# Patient Record
Sex: Male | Born: 1960
Health system: Southern US, Community
[De-identification: ages and names within clinical notes are randomized; demographics above are authoritative.]

## PROBLEM LIST (undated history)

## (undated) DIAGNOSIS — J189 Pneumonia, unspecified organism: Secondary | ICD-10-CM

## (undated) DIAGNOSIS — R7401 Elevation of levels of liver transaminase levels: Secondary | ICD-10-CM

## (undated) DIAGNOSIS — K760 Fatty (change of) liver, not elsewhere classified: Secondary | ICD-10-CM

## (undated) DIAGNOSIS — E119 Type 2 diabetes mellitus without complications: Secondary | ICD-10-CM

## (undated) DIAGNOSIS — H9319 Tinnitus, unspecified ear: Secondary | ICD-10-CM

## (undated) DIAGNOSIS — E785 Hyperlipidemia, unspecified: Secondary | ICD-10-CM

## (undated) DIAGNOSIS — G4700 Insomnia, unspecified: Secondary | ICD-10-CM

## (undated) DIAGNOSIS — R7303 Prediabetes: Secondary | ICD-10-CM

## (undated) DIAGNOSIS — M1990 Unspecified osteoarthritis, unspecified site: Secondary | ICD-10-CM

## (undated) DIAGNOSIS — E669 Obesity, unspecified: Secondary | ICD-10-CM

## (undated) DIAGNOSIS — R188 Other ascites: Secondary | ICD-10-CM

## (undated) DIAGNOSIS — M25562 Pain in left knee: Secondary | ICD-10-CM

## (undated) DIAGNOSIS — F419 Anxiety disorder, unspecified: Secondary | ICD-10-CM

## (undated) DIAGNOSIS — E042 Nontoxic multinodular goiter: Secondary | ICD-10-CM

## (undated) DIAGNOSIS — L409 Psoriasis, unspecified: Secondary | ICD-10-CM

## (undated) DIAGNOSIS — S39013A Strain of muscle, fascia and tendon of pelvis, initial encounter: Secondary | ICD-10-CM

## (undated) DIAGNOSIS — R5383 Other fatigue: Secondary | ICD-10-CM

## (undated) DIAGNOSIS — E039 Hypothyroidism, unspecified: Secondary | ICD-10-CM

## (undated) DIAGNOSIS — T7840XA Allergy, unspecified, initial encounter: Secondary | ICD-10-CM

## (undated) DIAGNOSIS — I10 Essential (primary) hypertension: Secondary | ICD-10-CM

## (undated) HISTORY — DX: Other fatigue: R53.83

## (undated) HISTORY — DX: Hypothyroidism, unspecified: E03.9

## (undated) HISTORY — DX: Allergy, unspecified, initial encounter: T78.40XA

## (undated) HISTORY — DX: Obesity, unspecified: E66.9

## (undated) HISTORY — DX: Hyperlipidemia, unspecified: E78.5

## (undated) HISTORY — PX: NOSE SURGERY: SHX723

## (undated) HISTORY — PX: ROTATOR CUFF REPAIR: SHX139

## (undated) HISTORY — PX: APPENDECTOMY: SHX54

## (undated) HISTORY — DX: Nontoxic multinodular goiter: E04.2

## (undated) HISTORY — DX: Other ascites: R18.8

## (undated) HISTORY — DX: Unspecified osteoarthritis, unspecified site: M19.90

## (undated) HISTORY — DX: Anxiety disorder, unspecified: F41.9

## (undated) HISTORY — DX: Pain in left knee: M25.562

## (undated) HISTORY — DX: Pneumonia, unspecified organism: J18.9

## (undated) HISTORY — DX: Insomnia, unspecified: G47.00

## (undated) HISTORY — DX: Tinnitus, unspecified ear: H93.19

## (undated) HISTORY — DX: Elevation of levels of liver transaminase levels: R74.01

## (undated) HISTORY — DX: Essential (primary) hypertension: I10

## (undated) HISTORY — DX: Strain of muscle, fascia and tendon of pelvis, initial encounter: S39.013A

## (undated) HISTORY — DX: Type 2 diabetes mellitus without complications: E11.9

---
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
## 2003-07-20 ENCOUNTER — Ambulatory Visit (HOSPITAL_COMMUNITY): Admission: RE | Admit: 2003-07-20 | Discharge: 2003-07-20 | Payer: Self-pay | Admitting: Orthopedic Surgery

## 2003-10-02 ENCOUNTER — Inpatient Hospital Stay (HOSPITAL_COMMUNITY): Admission: AD | Admit: 2003-10-02 | Discharge: 2003-10-05 | Payer: Self-pay | Admitting: Internal Medicine

## 2008-10-08 ENCOUNTER — Encounter: Admission: RE | Admit: 2008-10-08 | Discharge: 2008-10-08 | Payer: Self-pay | Admitting: Internal Medicine

## 2009-03-24 ENCOUNTER — Encounter: Admission: RE | Admit: 2009-03-24 | Discharge: 2009-03-24 | Payer: Self-pay | Admitting: Internal Medicine

## 2010-12-18 NOTE — H&P (Signed)
NAME:  Andrew Patton, Andrew Patton                         ACCOUNT NO.:  [id number]   MEDICAL RECORD NO.:  [id number]                   PATIENT TYPE:  INP   LOCATION:  5737                                 FACILITY:  [facility]   PHYSICIAN:  Kela Millin, M.D.             DATE OF BIRTH:  1960/10/15   DATE OF ADMISSION:  10/02/2003  DATE OF DISCHARGE:                                HISTORY & PHYSICAL   PRIMARY CARE PHYSICIAN:  Dr. Jacquenette Shone.   CHIEF COMPLAINT:  Fevers and cough.   HISTORY OF PRESENT ILLNESS:  The patient is a 50 year old white male who  presents six days after he had left shoulder surgery with complaints of  coughing spells and fevers.  He reports that following extubation after the  surgery, he had some difficulty breathing.  He was sent home the next day,  and he began to have fevers and chills.  He also has been coughing up blood-  tinged sputum.  He saw his primary care physician on the Monday following  the surgery, and a chest x-ray revealed a lung infiltrate, and he was  started on Tequin.  The patient reports that since then he has continued to  have coughing spells, which keep him up at night, and subjective fevers as  well.  He denies chest pain except when he coughs.  He also denies shortness  of breath, PND, pedal edema, and orthopnea.   The patient also denies dysuria, hematuria, melena, hematochezia,  hematemesis.   PAST MEDICAL HISTORY:  As above, status post left shoulder surgery.   The patient denies any history of diabetes or hypertension.   MEDICATIONS:  1. Oxycodone/APAP 1 to 2 q.4-6h. p.r.n..  2. Methocarbamol 1500 1 q8h. p.r.n.  3. Temazepam 30 mg 1 q.h.s. p.r.n.  4. Etodolac 500 mg 1 daily after meals.  5. Tequin 400 mg p.o. daily.   ALLERGIES:  No known drug allergies.   SOCIAL HISTORY:  He quit tobacco 10 years ago, denies alcohol and illicit  drugs.   FAMILY HISTORY:  His dad and granddad had prostate cancer.  His mom has  breast cancer.   REVIEW OF SYSTEMS:  As per HPI.  Other comprehensive Review of Systems  negative.   PHYSICAL EXAMINATION:  GENERAL:  The patient is a middle-aged white male,  sitting up in bed, in no acute distress.  VITAL SIGNS:  Temperature 98.6, heart rate 89, respiratory rate 20, blood  pressure 154/92.  O2 saturation 90% on room air.  HEENT:  Normocephalic and atraumatic.  PERRLA.  EOMI.  Moist mucous  membranes.  No oral exudates.  NECK:  Supple.  No adenopathy, no thyromegaly, and no JVD.  CARDIOVASCULAR;  Regular rate and rhythm.  Normal S1 and S2.  No S3  appreciated.  LUNGS:  Crackles in right base.  No wheezes.  ABDOMEN:  Soft.  Bowel sounds present.  Nontender, nondistended.  No  organomegaly.  EXTREMITIES:  Appropriate tenderness over left shoulder, lower extremities  with no edema or cyanosis.   LABORATORY DATA:  The pH is 7.45, PCO2 34.3, PO2 77.8, O2 saturation 96.3%.   IMPRESSION AND PLAN:  1. Probable pneumonia.  Will obtain sputum, Gram stain, and culture. Will     also obtain blood cultures, IV antibiotics, follow.  We will also treat     symptomatically with Tussionex and Robitussin p.r.n.  2. Status post left shoulder surgery.  Will continue anti-inflammatories as     well as Percocet p.r.n.                                                Kela Millin, M.D.    ACV/MEDQ  D:  10/02/2003  T:  10/02/2003  Job:  [id number]   cc:   Dr. Jacquenette Shone

## 2010-12-18 NOTE — Discharge Summary (Signed)
NAME:  Andrew Patton, Andrew Patton                         ACCOUNT NO.:  [id number]   MEDICAL RECORD NO.:  [id number]                   PATIENT TYPE:  INP   LOCATION:  5737                                 FACILITY:  [facility]   PHYSICIAN:  Deirdre Peer. Polite, M.D.              DATE OF BIRTH:  22-Aug-1960   DATE OF ADMISSION:  10/02/2003  DATE OF DISCHARGE:  10/05/2003                                 DISCHARGE SUMMARY   DISCHARGE DIAGNOSES:  1. Diffuse patchy infiltrate right lung consistent aspiration pneumonia,     improved at discharge.  2. Elevated liver function tests, asymptomatic.  The patient is as per the     outpatient follow-up versus inpatient evaluation.  3. Recent left shoulder surgery, improved.   DISCHARGE MEDICATIONS:  1. Augmentin 875 mg 1 p.o. b.i.d. x 10 days.  2. The patient is asked to resume other home medications for pain.  3. Ask to limit Tylenol until LFTs have been followed up and normalized.   DISPOSITION:  The patient is to be discharged to home studies.   LABORATORY DATA:  Chest x-ray shows diffuse right lung infiltrate.   CBC within normal limits.  BMET within normal limits.  AST/LFT 989 and 202  respectively.  Bilirubin 0.8.  Follow-up LFTs showed AST of 191, ALT 369.  Blood culture negative.  Sputum culture showed few white blood cells,  predominant PMN, gram-positive cocci  in pairs, positive with gram-negative  rods.  Find in cultured normal oropharyngeal flora.   HISTORY OF PRESENT ILLNESS:  A 50 year old male directly admitted to the  hospital with complaints of fevers, chills, and productive cough. The  patient's symptoms appeared to develop following a surgical procedure which  required intubation post extubation.  The patient had respiratory difficulty  with fever.  Symptoms were consistent with pneumonia.  The patient was  briefly treated with antibiotics and discharged to follow up with his  primary care physician.   Outpatient evaluation revealed  significant lung infiltrate which  precipitated inpatient evaluation.  Please see dictated HPI for further  details.   HOSPITAL COURSE:  Problem 1:  PNEUMONIA:  Mr. Heinecke was admitted to the  medicine floor for evaluation and treatment of pneumonia.  Because of the  type of relationship of the patient's surgical procedure, felt that this was  aspiration in cause.  Therefore, the patient was treated with broad-spectrum  antibiotics, IV Zosyn, oxygen, and frequently nebulizer treatments.  The  patient did not have any fever or elevation in his white blood cell count.  The cause of this was probably starting out the patient's antibiotics.   The patient's hospital course was one of progressive improvement and one of  decrease oxygen requirement.  Prior to discharge, he was able to ambulate  the halls with O2 saturation of 94-95%.  Sputum was obtained with organism  as stated above.  The patient will require 10 more days of  antibiotics as  well as outpatient follow-up by his primary care physician.   Problem 2:  ELEVATED LIVER FUNCTION TESTS:  Screening labs showed the  patient's LFTs to be abnormal on admission as stated above.  Repeat labs  showed continued increase.  The patient was without fever, chills, nausea,  or diarrhea.  No other GI complaints. No right upper quadrant pain.  The  patient asked about possible exposures and results were negative.  No  transfusions or history of hepatitis.  The patient is asymptomatic without  jaundice and with a normal exam and referred for further evaluation on an  outpatient basis.  Has been informed to limit Tylenol use until further  follow-up. Also of note, the patient has not been on any cholesterol  medications to suggest a mediation cause for the elevated LFTs.  At this  time, it is most likely related to extensive pneumonia in the right lung and  the patient may have some perihepatic inflammation related to that.  Again,  the patient would  prefer to have continued outpatient follow-up as far as  his elevated LFTs are concerned.   At this time, the patient is stable for discharge.                                                Deirdre Peer. Polite, M.D.    RDP/MEDQ  D:  10/05/2003  T:  10/06/2003  Job:  [id number]   cc:   Vikki Ports, M.D.  [address]  [place]  [place] [address]  Fax: [phone]

## 2011-01-24 ENCOUNTER — Emergency Department (HOSPITAL_COMMUNITY)
Admission: EM | Admit: 2011-01-24 | Discharge: 2011-01-24 | Disposition: A | Discharge status: Home / Self Care | Payer: PRIVATE HEALTH INSURANCE | Attending: Emergency Medicine | Admitting: Emergency Medicine

## 2011-01-24 DIAGNOSIS — S058X9A Other injuries of unspecified eye and orbit, initial encounter: Secondary | ICD-10-CM | POA: Insufficient documentation

## 2011-01-24 DIAGNOSIS — I10 Essential (primary) hypertension: Secondary | ICD-10-CM | POA: Insufficient documentation

## 2011-01-24 DIAGNOSIS — IMO0002 Reserved for concepts with insufficient information to code with codable children: Secondary | ICD-10-CM | POA: Insufficient documentation

## 2011-01-24 DIAGNOSIS — E7800 Pure hypercholesterolemia, unspecified: Secondary | ICD-10-CM | POA: Insufficient documentation

## 2011-10-12 ENCOUNTER — Other Ambulatory Visit: Payer: Self-pay | Admitting: Internal Medicine

## 2011-10-12 DIAGNOSIS — R7401 Elevation of levels of liver transaminase levels: Secondary | ICD-10-CM

## 2011-10-19 ENCOUNTER — Ambulatory Visit
Admission: RE | Admit: 2011-10-19 | Discharge: 2011-10-19 | Disposition: A | Discharge status: Home / Self Care | Payer: PRIVATE HEALTH INSURANCE | Source: Ambulatory Visit | Attending: Internal Medicine | Admitting: Internal Medicine

## 2011-10-19 DIAGNOSIS — R7401 Elevation of levels of liver transaminase levels: Secondary | ICD-10-CM

## 2013-01-03 ENCOUNTER — Encounter: Payer: Self-pay | Admitting: Gastroenterology

## 2013-01-24 ENCOUNTER — Ambulatory Visit (AMBULATORY_SURGERY_CENTER): Payer: BC Managed Care – PPO | Admitting: *Deleted

## 2013-01-24 VITALS — Ht 67.5 in | Wt 216.6 lb

## 2013-01-24 DIAGNOSIS — Z1211 Encounter for screening for malignant neoplasm of colon: Secondary | ICD-10-CM

## 2013-01-24 MED ORDER — PEG-KCL-NACL-NASULF-NA ASC-C 100 G PO SOLR: ORAL | Status: DC

## 2013-01-24 NOTE — Progress Notes (Signed)
No egg or soy allergy 

## 2013-01-25 ENCOUNTER — Encounter: Payer: Self-pay | Admitting: Gastroenterology

## 2013-02-07 ENCOUNTER — Ambulatory Visit (AMBULATORY_SURGERY_CENTER): Payer: BC Managed Care – PPO | Admitting: Gastroenterology

## 2013-02-07 ENCOUNTER — Encounter: Payer: Self-pay | Admitting: Gastroenterology

## 2013-02-07 VITALS — BP 134/77 | HR 68 | Temp 98.4°F | Resp 20 | Ht 67.5 in | Wt 216.0 lb

## 2013-02-07 DIAGNOSIS — Z1211 Encounter for screening for malignant neoplasm of colon: Secondary | ICD-10-CM

## 2013-02-07 MED ORDER — SODIUM CHLORIDE 0.9 % IV SOLN: 500.0000 mL | INTRAVENOUS | Status: DC

## 2013-02-07 NOTE — Progress Notes (Signed)
Lidocaine-40mg IV prior to Propofol InductionPropofol given over incremental dosages 

## 2013-02-07 NOTE — Progress Notes (Signed)
Patient did not experience any of the following events: a burn prior to discharge; a fall within the facility; wrong site/side/patient/procedure/implant event; or a hospital transfer or hospital admission upon discharge from the facility. (G8907) Patient did not have preoperative order for IV antibiotic SSI prophylaxis. (G8918)  

## 2013-02-07 NOTE — Patient Instructions (Addendum)
Discharge instructions given with verbal understanding. Normal exam. Resume previous medications. YOU HAD AN ENDOSCOPIC PROCEDURE TODAY AT THE [facility]: Refer to the procedure report that was given to you for any specific questions about what was found during the examination.  If the procedure report does not answer your questions, please call your gastroenterologist to clarify.  If you requested that your care partner not be given the details of your procedure findings, then the procedure report has been included in a sealed envelope for you to review at your convenience later.  YOU SHOULD EXPECT: Some feelings of bloating in the abdomen. Passage of more gas than usual.  Walking can help get rid of the air that was put into your GI tract during the procedure and reduce the bloating. If you had a lower endoscopy (such as a colonoscopy or flexible sigmoidoscopy) you may notice spotting of blood in your stool or on the toilet paper. If you underwent a bowel prep for your procedure, then you may not have a normal bowel movement for a few days.  DIET: Your first meal following the procedure should be a light meal and then it is ok to progress to your normal diet.  A half-sandwich or bowl of soup is an example of a good first meal.  Heavy or fried foods are harder to digest and may make you feel nauseous or bloated.  Likewise meals heavy in dairy and vegetables can cause extra gas to form and this can also increase the bloating.  Drink plenty of fluids but you should avoid alcoholic beverages for 24 hours.  ACTIVITY: Your care partner should take you home directly after the procedure.  You should plan to take it easy, moving slowly for the rest of the day.  You can resume normal activity the day after the procedure however you should NOT DRIVE or use heavy machinery for 24 hours (because of the sedation medicines used during the test).    SYMPTOMS TO REPORT IMMEDIATELY: A gastroenterologist  can be reached at any hour.  During normal business hours, 8:30 AM to 5:00 PM Monday through Friday, call [phone].  After hours and on weekends, please call the GI answering service at [phone] who will take a message and have the physician on call contact you.   Following lower endoscopy (colonoscopy or flexible sigmoidoscopy):  Excessive amounts of blood in the stool  Significant tenderness or worsening of abdominal pains  Swelling of the abdomen that is new, acute  Fever of 100F or higher  FOLLOW UP: If any biopsies were taken you will be contacted by phone or by letter within the next 1-3 weeks.  Call your gastroenterologist if you have not heard about the biopsies in 3 weeks.  Our staff will call the home number listed on your records the next business day following your procedure to check on you and address any questions or concerns that you may have at that time regarding the information given to you following your procedure. This is a courtesy call and so if there is no answer at the home number and we have not heard from you through the emergency physician on call, we will assume that you have returned to your regular daily activities without incident.  SIGNATURES/CONFIDENTIALITY: You and/or your care partner have signed paperwork which will be entered into your electronic medical record.  These signatures attest to the fact that that the information above on your After Visit Summary has been reviewed   and is understood.  Full responsibility of the confidentiality of this discharge information lies with you and/or your care-partner. 

## 2013-02-07 NOTE — Op Note (Signed)
[facility] [address] [place], [address]   COLONOSCOPY PROCEDURE REPORT  PATIENT: Andrew Patton, Andrew Patton  MR#: [id number] BIRTHDATE: Jan 14, 1961 , 51  yrs. old GENDER: Male ENDOSCOPIST: Mardella Layman, MD, Clementeen Graham REFERRED BY:  Juline Patch, M.D. PROCEDURE DATE:  02/07/2013 PROCEDURE:   Colonoscopy, screening ASA CLASS:   Class II INDICATIONS:average risk screening. MEDICATIONS: propofol (Diprivan) 200mg  IV  DESCRIPTION OF PROCEDURE:   After the risks and benefits and of the procedure were explained, informed consent was obtained.  A digital rectal exam revealed no abnormalities of the rectum.    The LB [id number]  endoscope was introduced through the anus and advanced to the cecum, which was identified by both the appendix and ileocecal valve .  The quality of the prep was excellent, using MoviPrep .  The instrument was then slowly withdrawn as the colon was fully examined.     COLON FINDINGS: A normal appearing cecum, ileocecal valve, and appendiceal orifice were identified.  The ascending, hepatic flexure, transverse, splenic flexure, descending, sigmoid colon and rectum appeared unremarkable.  No polyps or cancers were seen. Retroflexed views revealed no abnormalities.     The scope was then withdrawn from the patient and the procedure completed.  COMPLICATIONS: There were no complications. ENDOSCOPIC IMPRESSION: Normal colon ..no polyps noted.  RECOMMENDATIONS: 1.  Continue current medications 2.  Continue current colorectal screening recommendations for "routine risk" patients with a repeat colonoscopy in 10 years.   REPEAT EXAM:  cc:  _______________________________ eSignedMardella Layman, MD, [facility] 02/07/2013 8:49 AM

## 2013-02-08 ENCOUNTER — Telehealth: Payer: Self-pay

## 2013-02-08 NOTE — Telephone Encounter (Signed)
Left message on answering machine. 

## 2013-07-25 ENCOUNTER — Encounter: Payer: Self-pay | Admitting: Internal Medicine

## 2014-04-29 ENCOUNTER — Ambulatory Visit (INDEPENDENT_AMBULATORY_CARE_PROVIDER_SITE_OTHER): Payer: BC Managed Care – PPO | Admitting: Internal Medicine

## 2014-04-29 ENCOUNTER — Ambulatory Visit (INDEPENDENT_AMBULATORY_CARE_PROVIDER_SITE_OTHER): Payer: BC Managed Care – PPO

## 2014-04-29 VITALS — BP 122/82 | HR 91 | Temp 99.1°F | Resp 20 | Ht 66.5 in | Wt 229.4 lb

## 2014-04-29 DIAGNOSIS — I10 Essential (primary) hypertension: Secondary | ICD-10-CM | POA: Insufficient documentation

## 2014-04-29 DIAGNOSIS — M25569 Pain in unspecified knee: Secondary | ICD-10-CM

## 2014-04-29 DIAGNOSIS — E785 Hyperlipidemia, unspecified: Secondary | ICD-10-CM | POA: Insufficient documentation

## 2014-04-29 DIAGNOSIS — M25562 Pain in left knee: Secondary | ICD-10-CM

## 2014-04-29 NOTE — Progress Notes (Signed)
Subjective:   This chart was scribed for Ellamae Sia, MD by Arlan Organ, [facility] Scribe. This patient was seen in room 14 and the patient's care was started 6:58 PM.    Patient ID: [name], male    DOB: 1961/07/02, 53 y.o.   MRN: [id number]  Chief Complaint  Patient presents with  . Knee Pain    Left-Swollen on Friday  . Calf Pain    Started Sunday  . Numbness    Foot    HPI  HPI Comments: ELEANOR DIMICHELE is a 53 y.o. male with a PMHx of anxiety, hyperlipidemia, HTN, and arthritis who presents to [facility] complaining of constant, moderate L knee pain x 3 days. Pt also reports calf pain onset 1 day and paresthesia to the L foot. He denies any recent injury or trauma to the foot. Mr. Koury has not tried any OTC medications to help manage symptoms. However, he has attempted to stay off of his feet in last few days. Pt mentions L sided groin pain without any associated swelling that has been persistent for some time now. He denies any fever or chills. No blood flow issues to the feet.  There are no active problems to display for this patient.  Past Medical History  Diagnosis Date  . Allergy   . Anxiety   . Hyperlipidemia   . Hypertension   . Arthritis    Past Surgical History  Procedure Laterality Date  . Appendectomy    . Nose surgery    . Rotator cuff repair      left   Allergies  Allergen Reactions  . Penicillins Rash   Prior to Admission medications   Medication Sig Start Date End Date Taking? Authorizing Provider  aspirin 81 MG tablet Take 81 mg by mouth daily.   Yes Historical Provider, MD  Cholecalciferol (VITAMIN D3) 1000 UNITS CAPS Take by mouth daily.   Yes Historical Provider, MD  CLONAZEPAM PO Take by mouth as needed.   Yes Historical Provider, MD  colesevelam (WELCHOL) 625 MG tablet Take 1,250 mg by mouth daily.   Yes Historical Provider, MD  ezetimibe (ZETIA) 10 MG tablet Take 10 mg by mouth  daily.   Yes Historical Provider, MD  fluticasone Aleda Grana) 50 MCG/ACT nasal spray  01/05/13  Yes Historical Provider, MD  NON FORMULARY Mega Red 1 tablet daily   Yes Historical Provider, MD  olmesartan (BENICAR) 40 MG tablet Take 40 mg by mouth daily.   Yes Historical Provider, MD     Review of Systems  Constitutional: Negative for fever and chills.  Musculoskeletal: Positive for arthralgias and joint swelling.  Neurological: Positive for numbness. Negative for weakness.    Triage Vitals: BP 122/82  Pulse 91  Temp(Src) 99.1 F (37.3 C) (Oral)  Resp 20  Ht 5' 6.5" (1.689 m)  Wt 229 lb 6 oz (104.044 kg)  BMI 36.47 kg/m2  SpO2 97%   Objective:  Physical Exam  Nursing note and vitals reviewed. Constitutional: He is oriented to person, place, and time. He appears well-developed and well-nourished.  HENT:  Head: Normocephalic.  Eyes: EOM are normal.  Neck: Normal range of motion.  Pulmonary/Chest: Effort normal.  Abdominal: He exhibits no distension.  Musculoskeletal: Normal range of motion.  There is mild swelling of L knee and LLE with 1 plus pitting edema over anterior tibia No redness Knee has good ROM without pain until full flexion and no laxity  to stressors  Negative McMurrays test No popliteal cyst No defect in calf or mass Homan's test negative Calf non tender to palpation Mild tenderness of the hip adductors proximally   Neurological: He is alert and oriented to person, place, and time.  Psychiatric: He has a normal mood and affect.   [facility] reading (PRIMARY) by  Dr.Kalen Ratajczak=no bony injury    Assessment & Plan:   I personally performed the services described in this documentation, which was scribed in my presence. The recorded information has been reviewed and is accurate.   Left knee pain - Plan: DG Knee 1-2 Views Left  ?gout resolving w/ ibuprofen/?ruptured baker's cyst  Cont ibuprof LLE edema secondary  compr stocking to be active this week  F/u 2 wk  not well/sooner if worse

## 2014-06-04 ENCOUNTER — Other Ambulatory Visit: Payer: Self-pay | Admitting: Internal Medicine

## 2014-06-04 DIAGNOSIS — M25562 Pain in left knee: Secondary | ICD-10-CM

## 2014-06-11 ENCOUNTER — Ambulatory Visit
Admission: RE | Admit: 2014-06-11 | Discharge: 2014-06-11 | Disposition: A | Discharge status: Home / Self Care | Payer: BC Managed Care – PPO | Source: Ambulatory Visit | Attending: Internal Medicine | Admitting: Internal Medicine

## 2014-06-11 DIAGNOSIS — M25562 Pain in left knee: Secondary | ICD-10-CM

## 2014-06-21 ENCOUNTER — Other Ambulatory Visit: Payer: Self-pay | Admitting: Orthopedic Surgery

## 2014-06-21 DIAGNOSIS — M79605 Pain in left leg: Secondary | ICD-10-CM

## 2014-07-03 ENCOUNTER — Ambulatory Visit
Admission: RE | Admit: 2014-07-03 | Discharge: 2014-07-03 | Disposition: A | Discharge status: Home / Self Care | Payer: BC Managed Care – PPO | Source: Ambulatory Visit | Attending: Orthopedic Surgery | Admitting: Orthopedic Surgery

## 2014-07-03 DIAGNOSIS — M79605 Pain in left leg: Secondary | ICD-10-CM

## 2014-07-03 MED ORDER — GADOBENATE DIMEGLUMINE 529 MG/ML IV SOLN
20.0000 mL | Freq: Once | INTRAVENOUS | Status: AC | PRN
  Administered 2014-07-03: 20 mL via INTRAVENOUS

## 2015-12-08 DIAGNOSIS — L308 Other specified dermatitis: Secondary | ICD-10-CM | POA: Diagnosis not present

## 2015-12-08 DIAGNOSIS — L219 Seborrheic dermatitis, unspecified: Secondary | ICD-10-CM | POA: Diagnosis not present

## 2016-01-12 DIAGNOSIS — M65841 Other synovitis and tenosynovitis, right hand: Secondary | ICD-10-CM | POA: Diagnosis not present

## 2016-02-27 DIAGNOSIS — M79641 Pain in right hand: Secondary | ICD-10-CM | POA: Diagnosis not present

## 2016-02-27 DIAGNOSIS — M79642 Pain in left hand: Secondary | ICD-10-CM | POA: Diagnosis not present

## 2016-02-27 DIAGNOSIS — M159 Polyosteoarthritis, unspecified: Secondary | ICD-10-CM | POA: Diagnosis not present

## 2016-02-27 DIAGNOSIS — M65332 Trigger finger, left middle finger: Secondary | ICD-10-CM | POA: Diagnosis not present

## 2016-02-27 DIAGNOSIS — M25542 Pain in joints of left hand: Secondary | ICD-10-CM | POA: Diagnosis not present

## 2016-03-29 DIAGNOSIS — M159 Polyosteoarthritis, unspecified: Secondary | ICD-10-CM | POA: Diagnosis not present

## 2016-03-29 DIAGNOSIS — M65332 Trigger finger, left middle finger: Secondary | ICD-10-CM | POA: Diagnosis not present

## 2016-03-29 DIAGNOSIS — M25542 Pain in joints of left hand: Secondary | ICD-10-CM | POA: Diagnosis not present

## 2016-04-16 DIAGNOSIS — J018 Other acute sinusitis: Secondary | ICD-10-CM | POA: Diagnosis not present

## 2016-05-31 DIAGNOSIS — Z0000 Encounter for general adult medical examination without abnormal findings: Secondary | ICD-10-CM | POA: Diagnosis not present

## 2016-05-31 DIAGNOSIS — Z125 Encounter for screening for malignant neoplasm of prostate: Secondary | ICD-10-CM | POA: Diagnosis not present

## 2016-06-01 DIAGNOSIS — H524 Presbyopia: Secondary | ICD-10-CM | POA: Diagnosis not present

## 2016-06-04 ENCOUNTER — Other Ambulatory Visit: Payer: Self-pay | Admitting: Otolaryngology

## 2016-06-04 DIAGNOSIS — J329 Chronic sinusitis, unspecified: Secondary | ICD-10-CM

## 2016-06-04 DIAGNOSIS — Z8781 Personal history of (healed) traumatic fracture: Secondary | ICD-10-CM

## 2016-06-07 ENCOUNTER — Ambulatory Visit
Admission: RE | Admit: 2016-06-07 | Discharge: 2016-06-07 | Disposition: A | Discharge status: Home / Self Care | Payer: BLUE CROSS/BLUE SHIELD | Source: Ambulatory Visit | Attending: Otolaryngology | Admitting: Otolaryngology

## 2016-06-07 DIAGNOSIS — Z8781 Personal history of (healed) traumatic fracture: Secondary | ICD-10-CM

## 2016-06-07 DIAGNOSIS — J329 Chronic sinusitis, unspecified: Secondary | ICD-10-CM

## 2016-06-08 DIAGNOSIS — Z0000 Encounter for general adult medical examination without abnormal findings: Secondary | ICD-10-CM | POA: Diagnosis not present

## 2016-06-08 DIAGNOSIS — Z1212 Encounter for screening for malignant neoplasm of rectum: Secondary | ICD-10-CM | POA: Diagnosis not present

## 2016-06-08 DIAGNOSIS — E039 Hypothyroidism, unspecified: Secondary | ICD-10-CM | POA: Diagnosis not present

## 2016-06-08 DIAGNOSIS — Z23 Encounter for immunization: Secondary | ICD-10-CM | POA: Diagnosis not present

## 2016-06-08 DIAGNOSIS — E1165 Type 2 diabetes mellitus with hyperglycemia: Secondary | ICD-10-CM | POA: Diagnosis not present

## 2016-06-14 DIAGNOSIS — L03114 Cellulitis of left upper limb: Secondary | ICD-10-CM | POA: Diagnosis not present

## 2016-07-02 DIAGNOSIS — J310 Chronic rhinitis: Secondary | ICD-10-CM | POA: Diagnosis not present

## 2016-08-11 DIAGNOSIS — J018 Other acute sinusitis: Secondary | ICD-10-CM | POA: Diagnosis not present

## 2016-10-04 DIAGNOSIS — M25542 Pain in joints of left hand: Secondary | ICD-10-CM | POA: Diagnosis not present

## 2016-10-04 DIAGNOSIS — M159 Polyosteoarthritis, unspecified: Secondary | ICD-10-CM | POA: Diagnosis not present

## 2016-10-04 DIAGNOSIS — R21 Rash and other nonspecific skin eruption: Secondary | ICD-10-CM | POA: Diagnosis not present

## 2016-10-04 DIAGNOSIS — M65332 Trigger finger, left middle finger: Secondary | ICD-10-CM | POA: Diagnosis not present

## 2016-12-24 DIAGNOSIS — E7800 Pure hypercholesterolemia, unspecified: Secondary | ICD-10-CM | POA: Diagnosis not present

## 2016-12-24 DIAGNOSIS — I10 Essential (primary) hypertension: Secondary | ICD-10-CM | POA: Diagnosis not present

## 2017-01-03 DIAGNOSIS — E1165 Type 2 diabetes mellitus with hyperglycemia: Secondary | ICD-10-CM | POA: Diagnosis not present

## 2017-01-03 DIAGNOSIS — R740 Nonspecific elevation of levels of transaminase and lactic acid dehydrogenase [LDH]: Secondary | ICD-10-CM | POA: Diagnosis not present

## 2017-01-03 DIAGNOSIS — Z6831 Body mass index (BMI) 31.0-31.9, adult: Secondary | ICD-10-CM | POA: Diagnosis not present

## 2017-01-17 DIAGNOSIS — L218 Other seborrheic dermatitis: Secondary | ICD-10-CM | POA: Diagnosis not present

## 2017-04-06 ENCOUNTER — Emergency Department (HOSPITAL_COMMUNITY)
Admission: EM | Admit: 2017-04-06 | Discharge: 2017-04-06 | Disposition: A | Discharge status: Home / Self Care | Payer: BLUE CROSS/BLUE SHIELD | Attending: Emergency Medicine | Admitting: Emergency Medicine

## 2017-04-06 ENCOUNTER — Emergency Department (HOSPITAL_COMMUNITY): Payer: BLUE CROSS/BLUE SHIELD

## 2017-04-06 ENCOUNTER — Encounter (HOSPITAL_COMMUNITY): Payer: Self-pay | Admitting: *Deleted

## 2017-04-06 DIAGNOSIS — R404 Transient alteration of awareness: Secondary | ICD-10-CM | POA: Diagnosis not present

## 2017-04-06 DIAGNOSIS — E8881 Metabolic syndrome: Secondary | ICD-10-CM | POA: Diagnosis not present

## 2017-04-06 DIAGNOSIS — I10 Essential (primary) hypertension: Secondary | ICD-10-CM | POA: Insufficient documentation

## 2017-04-06 DIAGNOSIS — R509 Fever, unspecified: Secondary | ICD-10-CM | POA: Diagnosis not present

## 2017-04-06 DIAGNOSIS — J309 Allergic rhinitis, unspecified: Secondary | ICD-10-CM | POA: Diagnosis not present

## 2017-04-06 DIAGNOSIS — F17290 Nicotine dependence, other tobacco product, uncomplicated: Secondary | ICD-10-CM | POA: Diagnosis not present

## 2017-04-06 DIAGNOSIS — R55 Syncope and collapse: Secondary | ICD-10-CM | POA: Diagnosis not present

## 2017-04-06 DIAGNOSIS — F17210 Nicotine dependence, cigarettes, uncomplicated: Secondary | ICD-10-CM | POA: Diagnosis not present

## 2017-04-06 DIAGNOSIS — J189 Pneumonia, unspecified organism: Secondary | ICD-10-CM | POA: Diagnosis not present

## 2017-04-06 DIAGNOSIS — J181 Lobar pneumonia, unspecified organism: Secondary | ICD-10-CM

## 2017-04-06 DIAGNOSIS — E7800 Pure hypercholesterolemia, unspecified: Secondary | ICD-10-CM | POA: Diagnosis not present

## 2017-04-06 DIAGNOSIS — Z79899 Other long term (current) drug therapy: Secondary | ICD-10-CM | POA: Insufficient documentation

## 2017-04-06 DIAGNOSIS — E785 Hyperlipidemia, unspecified: Secondary | ICD-10-CM | POA: Diagnosis not present

## 2017-04-06 DIAGNOSIS — Z7982 Long term (current) use of aspirin: Secondary | ICD-10-CM | POA: Insufficient documentation

## 2017-04-06 LAB — COMPREHENSIVE METABOLIC PANEL
ALK PHOS: 75 U/L (ref 38–126)
ALT: 37 U/L (ref 17–63)
AST: 37 U/L (ref 15–41)
Albumin: 3.9 g/dL (ref 3.5–5.0)
Anion gap: 13 (ref 5–15)
BILIRUBIN TOTAL: 0.6 mg/dL (ref 0.3–1.2)
BUN: 12 mg/dL (ref 6–20)
CALCIUM: 9.4 mg/dL (ref 8.9–10.3)
CO2: 23 mmol/L (ref 22–32)
Chloride: 98 mmol/L — ABNORMAL LOW (ref 101–111)
Creatinine, Ser: 1.19 mg/dL (ref 0.61–1.24)
GFR calc non Af Amer: >60 mL/min (ref >60)
Glucose, Bld: 132 mg/dL — ABNORMAL HIGH (ref 65–99)
Potassium: 4.6 mmol/L (ref 3.5–5.1)
SODIUM: 134 mmol/L — AB (ref 135–145)
Total Protein: 7.6 g/dL (ref 6.5–8.1)

## 2017-04-06 LAB — CBC WITH DIFFERENTIAL/PLATELET
BASOS PCT: 0 %
Basophils Absolute: 0 10*3/uL (ref 0.0–0.1)
Eosinophils Absolute: 0 10*3/uL (ref 0.0–0.7)
Eosinophils Relative: 0 %
HCT: 41.7 % (ref 39.0–52.0)
Hemoglobin: 14.3 g/dL (ref 13.0–17.0)
Lymphocytes Relative: 10 %
Lymphs Abs: 1 10*3/uL (ref 0.7–4.0)
MCH: 34.2 pg — ABNORMAL HIGH (ref 26.0–34.0)
MCHC: 34.3 g/dL (ref 30.0–36.0)
MCV: 99.8 fL (ref 78.0–100.0)
Monocytes Absolute: 1.6 10*3/uL — ABNORMAL HIGH (ref 0.1–1.0)
Monocytes Relative: 16 %
Neutro Abs: 7.1 10*3/uL (ref 1.7–7.7)
Neutrophils Relative %: 74 %
Platelets: 305 10*3/uL (ref 150–400)
RBC: 4.18 MIL/uL — ABNORMAL LOW (ref 4.22–5.81)
RDW: 14.3 % (ref 11.5–15.5)
WBC: 9.7 10*3/uL (ref 4.0–10.5)

## 2017-04-06 LAB — TROPONIN I

## 2017-04-06 LAB — I-STAT CG4 LACTIC ACID, ED
LACTIC ACID, VENOUS: 1.4 mmol/L (ref 0.5–1.9)
Lactic Acid, Venous: 2.03 mmol/L (ref 0.5–1.9)

## 2017-04-06 MED ORDER — DOXYCYCLINE HYCLATE 100 MG PO CAPS: 100.0000 mg | ORAL_CAPSULE | Freq: Two times a day (BID) | ORAL | 0 refills | Status: DC

## 2017-04-06 MED ORDER — ACETAMINOPHEN 325 MG PO TABS
650.0000 mg | ORAL_TABLET | Freq: Once | ORAL | Status: AC
  Administered 2017-04-06: 650 mg via ORAL
  Filled 2017-04-06: qty 2

## 2017-04-06 MED ORDER — DEXTROSE 5 % IV SOLN
1.0000 g | Freq: Once | INTRAVENOUS | Status: AC
  Administered 2017-04-06: 1 g via INTRAVENOUS
  Filled 2017-04-06: qty 10

## 2017-04-06 MED ORDER — KETOROLAC TROMETHAMINE 30 MG/ML IJ SOLN
30.0000 mg | Freq: Once | INTRAMUSCULAR | Status: AC
  Administered 2017-04-06: 30 mg via INTRAVENOUS
  Filled 2017-04-06: qty 1

## 2017-04-06 MED ORDER — SODIUM CHLORIDE 0.9 % IV BOLUS (SEPSIS)
2000.0000 mL | Freq: Once | INTRAVENOUS | Status: AC
  Administered 2017-04-06: 2000 mL via INTRAVENOUS

## 2017-04-06 MED ORDER — AZITHROMYCIN 250 MG PO TABS
1000.0000 mg | ORAL_TABLET | Freq: Once | ORAL | Status: AC
  Administered 2017-04-06: 1000 mg via ORAL
  Filled 2017-04-06: qty 4

## 2017-04-06 NOTE — ED Provider Notes (Signed)
[facility]-EMERGENCY DEPT Provider Note   CSN: [id number] Arrival date & time: 04/06/17  1448     History   Chief Complaint Chief Complaint  Patient presents with  . Fever   HPI  Blood pressure 108/79, pulse (!) 118, temperature (!) 102.7 F (39.3 C), temperature source Oral, resp. rate 14, height 5\' 8"  (1.727 m), weight 80.7 kg (178 lb), SpO2 96 %.  Andrew Patton is a 56 y.o. male with past medical history significant for hypertension, hyperlipidemia, sent from primary care office for syncope. Patient was visiting his primary care for fever, MAXIMUM TEMPERATURE 102) onset 3 days ago with associated chills, decreased by mouth intake. Patient denies cough, rhinorrhea, sore throat, cervicalgia, headache,chest pain, abdominal pain, change in bowel or bladder habits, sick contacts, recent tick bites, rash, recent travel (he states that he traveled to [place] at the beginning of August). E's been taking antipyretic, 500 mg 2 tabs every 6 hours at home but the fever always returns. He had a prodrome of syncope of feeling very lightheaded. He states that he is normally very healthy, he is a fit bed and when he wakes up in the middle of the night he is drenched with sweat and can sometimes feel his heart beating quickly and is short of breath. He denies any history of DVT/PE, recent immobilizations, calf pain, leg swelling. He states that he only feels short of breath and has chest discomfort at night when he feels his heart racing. He states that he's had some very mild low back discomfort which he attributes to not being up and around as he normally would be since he's feeling under the weather. He denies any history of IV drug use, incontinence.   Past Medical History:  Diagnosis Date  . Allergy   . Anxiety   . Arthritis   . Hyperlipidemia   . Hypertension     Patient Active Problem List   Diagnosis Date Noted  . Unspecified essential hypertension 04/29/2014  . Other and unspecified  hyperlipidemia 04/29/2014    Past Surgical History:  Procedure Laterality Date  . APPENDECTOMY    . NOSE SURGERY    . ROTATOR CUFF REPAIR     left       Home Medications    Prior to Admission medications   Medication Sig Start Date End Date Taking? Authorizing Provider  aspirin 81 MG tablet Take 81 mg by mouth daily.    [provider]  Cholecalciferol (VITAMIN D3) 1000 UNITS CAPS Take by mouth daily.    [provider]  CLONAZEPAM PO Take by mouth as needed.    [provider]  colesevelam (WELCHOL) 625 MG tablet Take 1,250 mg by mouth daily.    [provider]  doxycycline (VIBRAMYCIN) 100 MG capsule Take 1 capsule (100 mg total) by mouth 2 (two) times daily. 04/06/17   Iniya Matzek, Joni Reining, PA-C  ezetimibe (ZETIA) 10 MG tablet Take 10 mg by mouth daily.    [provider]  fluticasone Aleda Grana) 50 MCG/ACT nasal spray  01/05/13   [provider]  NON FORMULARY Mega Red 1 tablet daily    [provider]  olmesartan (BENICAR) 40 MG tablet Take 40 mg by mouth daily.    [provider]    Family History Family History  Problem Relation Age of Onset  . Breast cancer Mother   . Hyperlipidemia Mother   . Bladder Cancer Father   . Birth defects Maternal Grandfather   . Birth defects  Paternal Grandmother   . Colon cancer Neg Hx   . Esophageal cancer Neg Hx   . Rectal cancer Neg Hx   . Stomach cancer Neg Hx     Social History Social History  Substance Use Topics  . Smoking status: Current Some Day Smoker    Types: Cigars  . Smokeless tobacco: Never Used     Comment: have cigar x2 a year.  . Alcohol use Yes     Comment: wine in the evening     Allergies   Penicillins   Review of Systems Review of Systems  A complete review of systems was obtained and all systems are negative except as noted in the HPI and PMH.    Physical Exam Updated Vital Signs BP (!) 99/58 (BP Location: Left Arm)   Pulse 75    Temp 97.7 F (36.5 C) (Oral)   Resp 18   Ht 5\' 8"  (1.727 m)   Wt 80.7 kg (178 lb)   SpO2 97%   BMI 27.06 kg/m   Physical Exam  Constitutional: He is oriented to person, place, and time. He appears well-developed and well-nourished. No distress.  Overall well appearing.   HENT:  Head: Normocephalic and atraumatic.  Right Ear: External ear normal.  Left Ear: External ear normal.  Mouth/Throat: Oropharynx is clear and moist. No oropharyngeal exudate.  No drooling or stridor. Posterior pharynx mildly erythematous no significant tonsillar hypertrophy. No exudate. Soft palate rises symmetrically. No TTP or induration under tongue.   No tenderness to palpation of frontal or bilateral maxillary sinuses.  No mucosal edema or rhinorrhea.  Bilateral tympanic membranes with normal architecture and good light reflex.    Eyes: Pupils are equal, round, and reactive to light. Conjunctivae and EOM are normal.  Neck: Normal range of motion. Neck supple.  FROM to C-spine. Pt can touch chin to chest without discomfort. No TTP of midline cervical spine.   Cardiovascular: Regular rhythm and intact distal pulses.   Tachycardic and regular.  Pulmonary/Chest: Effort normal and breath sounds normal. No stridor. No respiratory distress. He has no wheezes. He has no rales. He exhibits no tenderness.  Abdominal: Soft. There is no tenderness. There is no rebound and no guarding.  Musculoskeletal: Normal range of motion.  Neurological: He is alert and oriented to person, place, and time.  Skin: No rash noted. He is not diaphoretic.  Psychiatric: He has a normal mood and affect.  Nursing note and vitals reviewed.     ED Treatments / Results  Labs (all labs ordered are listed, but only abnormal results are displayed) Labs Reviewed  COMPREHENSIVE METABOLIC PANEL - Abnormal; Notable for the following:       Result Value   Sodium 134 (*)    Chloride 98 (*)    Glucose, Bld 132 (*)    All other  components within normal limits  CBC WITH DIFFERENTIAL/PLATELET - Abnormal; Notable for the following:    RBC 4.18 (*)    MCH 34.2 (*)    Monocytes Absolute 1.6 (*)    All other components within normal limits  I-STAT CG4 LACTIC ACID, ED - Abnormal; Notable for the following:    Lactic Acid, Venous 2.03 (*)    All other components within normal limits  CULTURE, BLOOD (ROUTINE X 2)  CULTURE, BLOOD (ROUTINE X 2)  TROPONIN I  URINALYSIS, ROUTINE W REFLEX MICROSCOPIC  ROCKY MTN SPOTTED FVR ABS PNL(IGG+IGM)  LYME DISEASE DNA BY PCR(BORRELIA BURG)  I-STAT CG4 LACTIC ACID,  ED  I-STAT CG4 LACTIC ACID, ED    EKG  EKG Interpretation  Date/Time:  Wednesday April 06 2017 14:48:23 EDT Ventricular Rate:  99 PR Interval:    QRS Duration: 91 QT Interval:  304 QTC Calculation: 390 R Axis:   25 Text Interpretation:  Sinus rhythm Probable left atrial enlargement Consider anterior infarct Confirmed by Vanetta Mulders [phone]) on 04/06/2017 5:02:20 PM       Radiology Dg Chest 2 View  Result Date: 04/06/2017 CLINICAL DATA:  Initial evaluation for acute fever for 3 days. EXAM: CHEST  2 VIEW COMPARISON:  Prior radiograph from 10/02/2003. FINDINGS: Cardiac and mediastinal silhouettes are within normal limits. Lungs are normally inflated. Patchy in multifocal parenchymal opacities within the left upper lobe, suspicious for pneumonia given provided history of fever. Additional scattered linear opacities at the left lung base most consistent with atelectasis and/ or scarring. Right lung is clear. No pulmonary edema or pleural effusion. No pneumothorax. No acute osseus abnormality. IMPRESSION: Patchy left upper lobe infiltrate, concerning for pneumonia. Electronically Signed   By: Rise Mu M.D.   On: 04/06/2017 15:46    Procedures Procedures (including critical care time)  Medications Ordered in ED Medications  sodium chloride 0.9 % bolus 2,000 mL (0 mLs Intravenous Stopped 04/06/17 1940)    ketorolac (TORADOL) 30 MG/ML injection 30 mg (30 mg Intravenous Given 04/06/17 1658)  cefTRIAXone (ROCEPHIN) 1 g in dextrose 5 % 50 mL IVPB (0 g Intravenous Stopped 04/06/17 1726)  azithromycin (ZITHROMAX) tablet 1,000 mg (1,000 mg Oral Given 04/06/17 1712)  acetaminophen (TYLENOL) tablet 650 mg (650 mg Oral Given 04/06/17 1749)     Initial Impression / Assessment and Plan / ED Course  I have reviewed the triage vital signs and the nursing notes.  Pertinent labs & imaging results that were available during my care of the patient were reviewed by me and considered in my medical decision making (see chart for details).     Vitals:   04/06/17 1715 04/06/17 1815 04/06/17 1845 04/06/17 1846  BP: 114/74 118/67 (!) 99/58 (!) 99/58  Pulse: 87 84 76 75  Resp: 19 15 18 18   Temp:    97.7 F (36.5 C)  TempSrc:    Oral  SpO2: 97% 97% 98% 97%  Weight:      Height:        Medications  sodium chloride 0.9 % bolus 2,000 mL (0 mLs Intravenous Stopped 04/06/17 1940)  ketorolac (TORADOL) 30 MG/ML injection 30 mg (30 mg Intravenous Given 04/06/17 1658)  cefTRIAXone (ROCEPHIN) 1 g in dextrose 5 % 50 mL IVPB (0 g Intravenous Stopped 04/06/17 1726)  azithromycin (ZITHROMAX) tablet 1,000 mg (1,000 mg Oral Given 04/06/17 1712)  acetaminophen (TYLENOL) tablet 650 mg (650 mg Oral Given 04/06/17 1749)    Andrew Patton is 56 y.o. male presenting with fever intermittently over the course of the last 3 days. He has some very mild palpitations and low back pain. No real consistent chest pain or shortness of breath. I doubt this is a PE given his significant fever. He did syncopize with a prodrome of nausea. Likely secondary to dehydration.Patient is febrile and tachycardic on my initial exam.  Surprisingly chest x-ray with left upper lobe infiltrate. Patient started on Rocephin and azithromycin. Lactic acid is very mildly elevated 2.03. Will hydrate and recheck. Repeat lactic acid normalized. Patient will be started on  doxycycline for community-acquired pneumonia  Evaluation does not show pathology that would require ongoing emergent intervention or inpatient  treatment. Pt is hemodynamically stable and mentating appropriately. Discussed findings and plan with patient/guardian, who agrees with care plan. All questions answered. Return precautions discussed and outpatient follow up given.      Final Clinical Impressions(s) / ED Diagnoses   Final diagnoses:  Community acquired pneumonia of left upper lobe of lung (HCC)    New Prescriptions Discharge Medication List as of 04/06/2017  7:36 PM    START taking these medications   Details  doxycycline (VIBRAMYCIN) 100 MG capsule Take 1 capsule (100 mg total) by mouth 2 (two) times daily., Starting Wed 04/06/2017, Print         Alegandra Sommers, PremontNicole, PA-C 04/06/17 2156    Vanetta MuldersZackowski, Scott, MD 04/07/17 [phone]

## 2017-04-06 NOTE — ED Notes (Signed)
Pt ambulated with a steady gait, notified Callie(RN)

## 2017-04-06 NOTE — ED Notes (Signed)
Lactic acid result shown to Dr. Deretha EmoryZackowski

## 2017-04-06 NOTE — Discharge Instructions (Signed)
For fever control you can take 650 mg of acetaminophen (Tylenol) this is normally 2 over the counter pills; every 4 hours, do not take any other medications that have acetaminophen as an ingredient. Do not drink alcohol.  4 hours after you take the acetaminophen (Tylenol) you can take ibuprofen. Take 3 over-the-counter pills, over-the-counter pills are normally 200 mg will take a total of 600 mg and then you can take the Tylenol 4 hours later, repeat this.  Please follow with your primary care doctor in the next 2 days for a check-up. They must obtain records for further management.   Do not hesitate to return to the Emergency Department for any new, worsening or concerning symptoms.

## 2017-04-06 NOTE — ED Triage Notes (Signed)
C/o fever onset 3 days ago , states he has been taking advil with relief however when the advil wears off the fever will return. C/o lower back pain

## 2017-04-06 NOTE — ED Notes (Signed)
Patient transported to X-ray 

## 2017-04-08 LAB — ROCKY MTN SPOTTED FVR ABS PNL(IGG+IGM)
RMSF IgG: NEGATIVE
RMSF IgM: 0.14 index (ref 0.00–0.89)

## 2017-04-11 LAB — CULTURE, BLOOD (ROUTINE X 2)
CULTURE: NO GROWTH
Culture: NO GROWTH
Special Requests: ADEQUATE
Special Requests: ADEQUATE

## 2017-04-14 DIAGNOSIS — J181 Lobar pneumonia, unspecified organism: Secondary | ICD-10-CM | POA: Diagnosis not present

## 2017-04-14 DIAGNOSIS — E1165 Type 2 diabetes mellitus with hyperglycemia: Secondary | ICD-10-CM | POA: Diagnosis not present

## 2017-04-19 ENCOUNTER — Emergency Department (HOSPITAL_COMMUNITY)
Admission: EM | Admit: 2017-04-19 | Discharge: 2017-04-19 | Disposition: A | Discharge status: Home / Self Care | Payer: BLUE CROSS/BLUE SHIELD | Attending: Emergency Medicine | Admitting: Emergency Medicine

## 2017-04-19 ENCOUNTER — Encounter (HOSPITAL_COMMUNITY): Payer: Self-pay

## 2017-04-19 DIAGNOSIS — I10 Essential (primary) hypertension: Secondary | ICD-10-CM | POA: Diagnosis not present

## 2017-04-19 DIAGNOSIS — Z7982 Long term (current) use of aspirin: Secondary | ICD-10-CM | POA: Diagnosis not present

## 2017-04-19 DIAGNOSIS — Z79899 Other long term (current) drug therapy: Secondary | ICD-10-CM | POA: Diagnosis not present

## 2017-04-19 DIAGNOSIS — R55 Syncope and collapse: Secondary | ICD-10-CM | POA: Insufficient documentation

## 2017-04-19 DIAGNOSIS — R404 Transient alteration of awareness: Secondary | ICD-10-CM | POA: Diagnosis not present

## 2017-04-19 DIAGNOSIS — F17210 Nicotine dependence, cigarettes, uncomplicated: Secondary | ICD-10-CM | POA: Insufficient documentation

## 2017-04-19 LAB — CBC WITH DIFFERENTIAL/PLATELET
BASOS ABS: 0 10*3/uL (ref 0.0–0.1)
BASOS PCT: 0 %
EOS ABS: 0.1 10*3/uL (ref 0.0–0.7)
EOS PCT: 2 %
HCT: 36.5 % — ABNORMAL LOW (ref 39.0–52.0)
Hemoglobin: 12.7 g/dL — ABNORMAL LOW (ref 13.0–17.0)
Lymphocytes Relative: 21 %
Lymphs Abs: 1.5 10*3/uL (ref 0.7–4.0)
MCH: 34.2 pg — ABNORMAL HIGH (ref 26.0–34.0)
MCHC: 34.8 g/dL (ref 30.0–36.0)
MCV: 98.4 fL (ref 78.0–100.0)
MONO ABS: 0.5 10*3/uL (ref 0.1–1.0)
MONOS PCT: 7 %
Neutro Abs: 5.1 10*3/uL (ref 1.7–7.7)
Neutrophils Relative %: 70 %
PLATELETS: 438 10*3/uL — AB (ref 150–400)
RBC: 3.71 MIL/uL — ABNORMAL LOW (ref 4.22–5.81)
RDW: 14.1 % (ref 11.5–15.5)
WBC: 7.2 10*3/uL (ref 4.0–10.5)

## 2017-04-19 LAB — BASIC METABOLIC PANEL
ANION GAP: 12 (ref 5–15)
BUN: 13 mg/dL (ref 6–20)
CO2: 24 mmol/L (ref 22–32)
CREATININE: 0.99 mg/dL (ref 0.61–1.24)
Calcium: 9.1 mg/dL (ref 8.9–10.3)
Chloride: 94 mmol/L — ABNORMAL LOW (ref 101–111)
Glucose, Bld: 111 mg/dL — ABNORMAL HIGH (ref 65–99)
Potassium: 5.5 mmol/L — ABNORMAL HIGH (ref 3.5–5.1)
Sodium: 130 mmol/L — ABNORMAL LOW (ref 135–145)

## 2017-04-19 LAB — TROPONIN I: Troponin I: <0.03 ng/mL (ref <0.03)

## 2017-04-19 LAB — CBG MONITORING, ED: Glucose-Capillary: 118 mg/dL — ABNORMAL HIGH (ref 65–99)

## 2017-04-19 MED ORDER — SODIUM CHLORIDE 0.9 % IV BOLUS (SEPSIS)
1000.0000 mL | Freq: Once | INTRAVENOUS | Status: AC
  Administered 2017-04-19: 1000 mL via INTRAVENOUS

## 2017-04-19 MED ORDER — SODIUM CHLORIDE 0.9 % IV SOLN: INTRAVENOUS | Status: DC

## 2017-04-19 NOTE — ED Notes (Signed)
Bed: ZO10 Expected date:  Expected time:  Means of arrival:  Comments: EMS- syncope, hypotension

## 2017-04-19 NOTE — Discharge Instructions (Signed)
Your potassium today was 5.5. Call your doctor tomorrow to schedule a repeat potassium

## 2017-04-19 NOTE — ED Provider Notes (Signed)
[facility]-EMERGENCY DEPT Provider Note   CSN: [id number] Arrival date & time: 04/19/17  1201     History   Chief Complaint Chief Complaint  Patient presents with  . Loss of Consciousness  . Hypotension    HPI IZEN PETZ is a 56 y.o. male.  56 year old male presents after having a near-syncopal event where he became dizzy and lightheaded. Patient does come back to work today after being treated for pneumonia. He denied being short of breath or having chest pain. No vomiting or diarrhea or fever. Patient became dizzy and lightheaded with some associated diaphoresis and said he passed off about 20 seconds. Denies any injuries from that event. No reported seizure activity. EMS was called and patient be hypotensive and was treated with IV saline he feels much better at this time. Denies any recent medication changes.      Past Medical History:  Diagnosis Date  . Allergy   . Anxiety   . Arthritis   . Hyperlipidemia   . Hypertension     Patient Active Problem List   Diagnosis Date Noted  . Unspecified essential hypertension 04/29/2014  . Other and unspecified hyperlipidemia 04/29/2014    Past Surgical History:  Procedure Laterality Date  . APPENDECTOMY    . NOSE SURGERY    . ROTATOR CUFF REPAIR     left       Home Medications    Prior to Admission medications   Medication Sig Start Date End Date Taking? Authorizing Provider  aspirin 81 MG tablet Take 81 mg by mouth daily.   Yes [provider]  Cholecalciferol (VITAMIN D3) 1000 UNITS CAPS Take 1 capsule by mouth daily.    Yes [provider]  colesevelam (WELCHOL) 625 MG tablet Take 1,250 mg by mouth daily.   Yes [provider]  diphenhydrAMINE (BENADRYL) 25 MG tablet Take 25 mg by mouth at bedtime as needed for allergies.   Yes [provider]  ezetimibe (ZETIA) 10 MG tablet Take 10 mg by mouth daily.   Yes [provider]  fluticasone (FLONASE) 50 MCG/ACT nasal spray  Place 1 spray into both nostrils daily.  01/05/13  Yes [provider]  metFORMIN (GLUCOPHAGE) 500 MG tablet Take 500 mg by mouth every evening.   Yes [provider]  NON FORMULARY Take 1 tablet by mouth daily. Mega Red 1 tablet daily    Yes [provider]  olmesartan (BENICAR) 40 MG tablet Take 40 mg by mouth daily.   Yes [provider]  zolpidem (AMBIEN) 10 MG tablet Take 5 mg by mouth at bedtime as needed for sleep.   Yes [provider]  doxycycline (VIBRAMYCIN) 100 MG capsule Take 1 capsule (100 mg total) by mouth 2 (two) times daily. Patient not taking: Reported on 04/19/2017 04/06/17   Pisciotta, Joni Reining, PA-C    Family History Family History  Problem Relation Age of Onset  . Breast cancer Mother   . Hyperlipidemia Mother   . Bladder Cancer Father   . Birth defects Maternal Grandfather   . Birth defects Paternal Grandmother   . Colon cancer Neg Hx   . Esophageal cancer Neg Hx   . Rectal cancer Neg Hx   . Stomach cancer Neg Hx     Social History Social History  Substance Use Topics  . Smoking status: Current Some Day Smoker    Types: Cigars  . Smokeless tobacco: Never Used     Comment: have cigar x2 a year.  Marland Kitchen  Alcohol use Yes     Comment: wine in the evening     Allergies   Penicillins   Review of Systems Review of Systems  All other systems reviewed and are negative.    Physical Exam Updated Vital Signs BP 127/69 (BP Location: Right Arm)   Pulse 66   Temp 97.8 F (36.6 C) (Oral)   Resp 16   Ht 1.702 m ( )   Wt 82.6 kg (182 lb)   SpO2 99%   BMI 28.51 kg/m   Physical Exam  Constitutional: He is oriented to person, place, and time. He appears well-developed and well-nourished.  Non-toxic appearance. No distress.  HENT:  Head: Normocephalic and atraumatic.  Eyes: Pupils are equal, round, and reactive to light. Conjunctivae, EOM and lids are normal.  Neck: Normal range of motion. Neck supple. No tracheal  deviation present. No thyroid mass present.  Cardiovascular: Normal rate, regular rhythm and normal heart sounds.  Exam reveals no gallop.   No murmur heard. Pulmonary/Chest: Effort normal and breath sounds normal. No stridor. No respiratory distress. He has no decreased breath sounds. He has no wheezes. He has no rhonchi. He has no rales.  Abdominal: Soft. Normal appearance and bowel sounds are normal. He exhibits no distension. There is no tenderness. There is no rebound and no CVA tenderness.  Musculoskeletal: Normal range of motion. He exhibits no edema or tenderness.  Neurological: He is alert and oriented to person, place, and time. He has normal strength. No cranial nerve deficit or sensory deficit. GCS eye subscore is 4. GCS verbal subscore is 5. GCS motor subscore is 6.  Skin: Skin is warm and dry. No abrasion and no rash noted.  Psychiatric: He has a normal mood and affect. His speech is normal and behavior is normal.  Nursing note and vitals reviewed.    ED Treatments / Results  Labs (all labs ordered are listed, but only abnormal results are displayed) Labs Reviewed  CBG MONITORING, ED - Abnormal; Notable for the following:       Result Value   Glucose-Capillary 118 (*)    All other components within normal limits  CBC WITH DIFFERENTIAL/PLATELET  BASIC METABOLIC PANEL  TROPONIN I    EKG  EKG Interpretation  Date/Time:  Tuesday April 19 2017 12:06:48 EDT Ventricular Rate:  66 PR Interval:    QRS Duration: 98 QT Interval:  402 QTC Calculation: 422 R Axis:   77 Text Interpretation:  Sinus rhythm Confirmed by Lorre Nick ([id number]) on 04/19/2017 12:15:57 PM       Radiology No results found.  Procedures Procedures (including critical care time)  Medications Ordered in ED Medications  sodium chloride 0.9 % bolus 1,000 mL (not administered)  0.9 %  sodium chloride infusion (not administered)     Initial Impression / Assessment and Plan / ED Course  I  have reviewed the triage vital signs and the nursing notes.  Pertinent labs & imaging results that were available during my care of the patient were reviewed by me and considered in my medical decision making (see chart for details).     The patient received IV fluids and feels better. Does have mild hyperkalemia with a potassium of 5.5. Patient is not orthostatic. We'll follow-up his doctor and return precautions given  Final Clinical Impressions(s) / ED Diagnoses   Final diagnoses:  None    New Prescriptions New Prescriptions   No medications on file     Lorre Nick, MD 04/19/17 1545

## 2017-04-19 NOTE — ED Triage Notes (Signed)
Per EMS- Patient was at work and had a syncopal episode x 3 lasting 20 seconds each.per co-worker. Patient was diaphoretic when EMS arrived. Initial BP-80/50 sitting and 62/40 standing. Patient received NS 500 ml Iv prior to arrival to the ED. Patient was treated for pneumonia at [facility] last week.

## 2017-04-21 DIAGNOSIS — D649 Anemia, unspecified: Secondary | ICD-10-CM | POA: Diagnosis not present

## 2017-04-21 DIAGNOSIS — J309 Allergic rhinitis, unspecified: Secondary | ICD-10-CM | POA: Diagnosis not present

## 2017-04-21 DIAGNOSIS — R55 Syncope and collapse: Secondary | ICD-10-CM | POA: Diagnosis not present

## 2017-04-21 DIAGNOSIS — E8881 Metabolic syndrome: Secondary | ICD-10-CM | POA: Diagnosis not present

## 2017-04-21 DIAGNOSIS — E871 Hypo-osmolality and hyponatremia: Secondary | ICD-10-CM | POA: Diagnosis not present

## 2017-04-21 DIAGNOSIS — Z23 Encounter for immunization: Secondary | ICD-10-CM | POA: Diagnosis not present

## 2017-04-21 DIAGNOSIS — E7800 Pure hypercholesterolemia, unspecified: Secondary | ICD-10-CM | POA: Diagnosis not present

## 2017-05-06 DIAGNOSIS — R0989 Other specified symptoms and signs involving the circulatory and respiratory systems: Secondary | ICD-10-CM | POA: Diagnosis not present

## 2017-05-06 DIAGNOSIS — R55 Syncope and collapse: Secondary | ICD-10-CM | POA: Diagnosis not present

## 2017-05-06 DIAGNOSIS — I10 Essential (primary) hypertension: Secondary | ICD-10-CM | POA: Diagnosis not present

## 2017-05-12 DIAGNOSIS — J181 Lobar pneumonia, unspecified organism: Secondary | ICD-10-CM | POA: Diagnosis not present

## 2017-05-12 DIAGNOSIS — J159 Unspecified bacterial pneumonia: Secondary | ICD-10-CM | POA: Diagnosis not present

## 2017-05-12 DIAGNOSIS — E871 Hypo-osmolality and hyponatremia: Secondary | ICD-10-CM | POA: Diagnosis not present

## 2017-05-12 DIAGNOSIS — J189 Pneumonia, unspecified organism: Secondary | ICD-10-CM | POA: Diagnosis not present

## 2017-05-12 DIAGNOSIS — I10 Essential (primary) hypertension: Secondary | ICD-10-CM | POA: Diagnosis not present

## 2017-05-23 DIAGNOSIS — R55 Syncope and collapse: Secondary | ICD-10-CM | POA: Diagnosis not present

## 2017-05-23 DIAGNOSIS — R0989 Other specified symptoms and signs involving the circulatory and respiratory systems: Secondary | ICD-10-CM | POA: Diagnosis not present

## 2017-05-26 ENCOUNTER — Encounter (HOSPITAL_COMMUNITY): Payer: Self-pay

## 2017-05-26 ENCOUNTER — Ambulatory Visit (HOSPITAL_COMMUNITY)
Admission: RE | Admit: 2017-05-26 | Discharge: 2017-05-26 | Disposition: A | Discharge status: Home / Self Care | Payer: BLUE CROSS/BLUE SHIELD | Source: Ambulatory Visit | Attending: Internal Medicine | Admitting: Internal Medicine

## 2017-05-26 DIAGNOSIS — E870 Hyperosmolality and hypernatremia: Secondary | ICD-10-CM | POA: Insufficient documentation

## 2017-05-26 DIAGNOSIS — E875 Hyperkalemia: Secondary | ICD-10-CM | POA: Diagnosis not present

## 2017-05-26 HISTORY — DX: Prediabetes: R73.03

## 2017-05-26 LAB — ACTH STIMULATION, 3 TIME POINTS
CORTISOL BASE: 13.9 ug/dL
Cortisol, 30 Min: 18 ug/dL
Cortisol, 60 Min: 20.8 ug/dL

## 2017-05-26 MED ORDER — COSYNTROPIN 0.25 MG IJ SOLR
0.2500 mg | Freq: Once | INTRAMUSCULAR | Status: AC
  Administered 2017-05-26: 0.25 mg via INTRAVENOUS
  Filled 2017-05-26: qty 0.25

## 2017-05-26 NOTE — Discharge Instructions (Signed)
ACTH Stimulation Test °Why am I having this test? °The adrenocorticotropic hormone (ACTH) stimulation test indirectly shows how well your adrenal glands are working. ACTH is a hormone that is produced by a gland in your brain called the pituitary gland. ACTH stimulates your two adrenal glands, which are located above each kidney. The adrenal glands produce hormones that are released into the blood. One of these hormones is cortisol. Cortisol helps your body to respond to stress. If your adrenal glands are not responding to ACTH properly, you may have too much or too little cortisol. °What kind of sample is taken? °Two or more blood samples are required for this test. Blood samples are usually collected by inserting a needle into a vein. Cortisol will be measured in the first blood sample to provide a baseline level. °After the first blood sample has been collected, you will be given cosyntropin. Cosyntropin is similar to ACTH and should cause the adrenal glands to release cortisol. Cosyntropin is usually given through an IV tube. It could also be given as an intramuscular (IM) injection. At specified intervals after receiving the cosyntropin, you will have one or more blood samples taken to measure your cortisol levels. The test results will be compared to show the amount of cortisol in your blood before and after you were given cosyntropin. °How do I prepare for this test? °Do not eat or drink anything after midnight on the night before the test or as directed by your health care provider. °What are the reference values? °Reference values are considered healthy values established after testing a large group of healthy people. Reference values may vary among different people, labs, and hospitals. It is your responsibility to obtain your test results. Ask the lab or department performing the test when and how you will get your results. °The following are reference values for the various ACTH stimulation  tests: °· Rapid test: cortisol levels increase greater than 7 mg/dL above baseline. °· 24-hour test: cortisol levels greater than 40 mcg/dL. °· 3-day test: cortisol levels greater than 40 mcg/dL. ° °What do the results mean? °Results outside of the reference value may indicate: °· Cushing syndrome. °· Adrenal insufficiency. ° °Talk with your health care provider to discuss your results, treatment options, and if necessary, the need for more tests. Talk with your health care provider if you have any questions about your results. °Talk with your health care provider to discuss your results, treatment options, and if necessary, the need for more tests. Talk with your health care provider if you have any questions about your results. °This information is not intended to replace advice given to you by your health care provider. Make sure you discuss any questions you have with your health care provider. °Document Released: 08/21/2010 Document Revised: 03/22/2016 Document Reviewed: 02/27/2014 °Elsevier Interactive Patient Education © 2018 [employer] ° °

## 2017-06-10 DIAGNOSIS — I10 Essential (primary) hypertension: Secondary | ICD-10-CM | POA: Diagnosis not present

## 2017-06-10 DIAGNOSIS — Z0000 Encounter for general adult medical examination without abnormal findings: Secondary | ICD-10-CM | POA: Diagnosis not present

## 2017-06-10 DIAGNOSIS — Z125 Encounter for screening for malignant neoplasm of prostate: Secondary | ICD-10-CM | POA: Diagnosis not present

## 2017-06-15 DIAGNOSIS — I10 Essential (primary) hypertension: Secondary | ICD-10-CM | POA: Diagnosis not present

## 2017-06-15 DIAGNOSIS — Z0000 Encounter for general adult medical examination without abnormal findings: Secondary | ICD-10-CM | POA: Diagnosis not present

## 2017-06-15 DIAGNOSIS — J309 Allergic rhinitis, unspecified: Secondary | ICD-10-CM | POA: Diagnosis not present

## 2017-06-15 DIAGNOSIS — Z1212 Encounter for screening for malignant neoplasm of rectum: Secondary | ICD-10-CM | POA: Diagnosis not present

## 2017-06-15 DIAGNOSIS — E8881 Metabolic syndrome: Secondary | ICD-10-CM | POA: Diagnosis not present

## 2017-06-16 DIAGNOSIS — M79641 Pain in right hand: Secondary | ICD-10-CM | POA: Diagnosis not present

## 2017-06-16 DIAGNOSIS — M159 Polyosteoarthritis, unspecified: Secondary | ICD-10-CM | POA: Diagnosis not present

## 2017-06-16 DIAGNOSIS — Z13828 Encounter for screening for other musculoskeletal disorder: Secondary | ICD-10-CM | POA: Diagnosis not present

## 2017-06-16 DIAGNOSIS — M25542 Pain in joints of left hand: Secondary | ICD-10-CM | POA: Diagnosis not present

## 2017-06-16 DIAGNOSIS — M79645 Pain in left finger(s): Secondary | ICD-10-CM | POA: Diagnosis not present

## 2017-06-16 DIAGNOSIS — M79642 Pain in left hand: Secondary | ICD-10-CM | POA: Diagnosis not present

## 2017-06-16 DIAGNOSIS — L4050 Arthropathic psoriasis, unspecified: Secondary | ICD-10-CM | POA: Diagnosis not present

## 2017-09-16 DIAGNOSIS — L4050 Arthropathic psoriasis, unspecified: Secondary | ICD-10-CM | POA: Diagnosis not present

## 2017-09-16 DIAGNOSIS — R21 Rash and other nonspecific skin eruption: Secondary | ICD-10-CM | POA: Diagnosis not present

## 2017-09-16 DIAGNOSIS — M159 Polyosteoarthritis, unspecified: Secondary | ICD-10-CM | POA: Diagnosis not present

## 2017-09-16 DIAGNOSIS — M25542 Pain in joints of left hand: Secondary | ICD-10-CM | POA: Diagnosis not present

## 2017-10-13 DIAGNOSIS — I10 Essential (primary) hypertension: Secondary | ICD-10-CM | POA: Diagnosis not present

## 2017-10-13 DIAGNOSIS — E1165 Type 2 diabetes mellitus with hyperglycemia: Secondary | ICD-10-CM | POA: Diagnosis not present

## 2018-01-12 DIAGNOSIS — M79643 Pain in unspecified hand: Secondary | ICD-10-CM | POA: Diagnosis not present

## 2018-01-12 DIAGNOSIS — M1990 Unspecified osteoarthritis, unspecified site: Secondary | ICD-10-CM | POA: Diagnosis not present

## 2018-01-12 DIAGNOSIS — L409 Psoriasis, unspecified: Secondary | ICD-10-CM | POA: Diagnosis not present

## 2018-01-12 DIAGNOSIS — L4050 Arthropathic psoriasis, unspecified: Secondary | ICD-10-CM | POA: Diagnosis not present

## 2018-02-09 DIAGNOSIS — M25511 Pain in right shoulder: Secondary | ICD-10-CM | POA: Diagnosis not present

## 2018-02-09 DIAGNOSIS — M13811 Other specified arthritis, right shoulder: Secondary | ICD-10-CM | POA: Diagnosis not present

## 2018-02-27 DIAGNOSIS — S9031XA Contusion of right foot, initial encounter: Secondary | ICD-10-CM | POA: Diagnosis not present

## 2018-04-14 DIAGNOSIS — H9313 Tinnitus, bilateral: Secondary | ICD-10-CM | POA: Diagnosis not present

## 2018-04-14 DIAGNOSIS — Z205 Contact with and (suspected) exposure to viral hepatitis: Secondary | ICD-10-CM | POA: Diagnosis not present

## 2018-04-14 DIAGNOSIS — L409 Psoriasis, unspecified: Secondary | ICD-10-CM | POA: Diagnosis not present

## 2018-04-14 DIAGNOSIS — J342 Deviated nasal septum: Secondary | ICD-10-CM | POA: Diagnosis not present

## 2018-04-14 DIAGNOSIS — M1990 Unspecified osteoarthritis, unspecified site: Secondary | ICD-10-CM | POA: Diagnosis not present

## 2018-04-14 DIAGNOSIS — J310 Chronic rhinitis: Secondary | ICD-10-CM | POA: Diagnosis not present

## 2018-04-14 DIAGNOSIS — M79643 Pain in unspecified hand: Secondary | ICD-10-CM | POA: Diagnosis not present

## 2018-04-14 DIAGNOSIS — L4050 Arthropathic psoriasis, unspecified: Secondary | ICD-10-CM | POA: Diagnosis not present

## 2018-04-21 DIAGNOSIS — R7989 Other specified abnormal findings of blood chemistry: Secondary | ICD-10-CM | POA: Diagnosis not present

## 2018-04-21 DIAGNOSIS — Z23 Encounter for immunization: Secondary | ICD-10-CM | POA: Diagnosis not present

## 2018-04-24 DIAGNOSIS — H9313 Tinnitus, bilateral: Secondary | ICD-10-CM | POA: Diagnosis not present

## 2018-04-24 DIAGNOSIS — H903 Sensorineural hearing loss, bilateral: Secondary | ICD-10-CM | POA: Diagnosis not present

## 2018-04-27 ENCOUNTER — Other Ambulatory Visit: Payer: Self-pay | Admitting: Otolaryngology

## 2018-04-27 DIAGNOSIS — H9313 Tinnitus, bilateral: Secondary | ICD-10-CM

## 2018-05-09 DIAGNOSIS — R002 Palpitations: Secondary | ICD-10-CM | POA: Diagnosis not present

## 2018-05-09 DIAGNOSIS — F5101 Primary insomnia: Secondary | ICD-10-CM | POA: Diagnosis not present

## 2018-05-10 DIAGNOSIS — R945 Abnormal results of liver function studies: Secondary | ICD-10-CM | POA: Diagnosis not present

## 2018-05-10 DIAGNOSIS — R748 Abnormal levels of other serum enzymes: Secondary | ICD-10-CM | POA: Diagnosis not present

## 2018-05-12 DIAGNOSIS — R000 Tachycardia, unspecified: Secondary | ICD-10-CM | POA: Diagnosis not present

## 2018-05-12 DIAGNOSIS — I10 Essential (primary) hypertension: Secondary | ICD-10-CM | POA: Diagnosis not present

## 2018-05-16 ENCOUNTER — Ambulatory Visit
Admission: RE | Admit: 2018-05-16 | Discharge: 2018-05-16 | Disposition: A | Discharge status: Home / Self Care | Payer: BLUE CROSS/BLUE SHIELD | Source: Ambulatory Visit | Attending: Otolaryngology | Admitting: Otolaryngology

## 2018-05-16 DIAGNOSIS — H9313 Tinnitus, bilateral: Secondary | ICD-10-CM

## 2018-05-16 DIAGNOSIS — R7989 Other specified abnormal findings of blood chemistry: Secondary | ICD-10-CM | POA: Diagnosis not present

## 2018-05-16 DIAGNOSIS — J341 Cyst and mucocele of nose and nasal sinus: Secondary | ICD-10-CM | POA: Diagnosis not present

## 2018-05-16 MED ORDER — GADOBENATE DIMEGLUMINE 529 MG/ML IV SOLN
18.0000 mL | Freq: Once | INTRAVENOUS | Status: AC | PRN
Start: 2018-05-16 — End: 2018-05-16
  Administered 2018-05-16: 18 mL via INTRAVENOUS

## 2018-05-24 DIAGNOSIS — J310 Chronic rhinitis: Secondary | ICD-10-CM | POA: Diagnosis not present

## 2018-05-24 DIAGNOSIS — H6993 Unspecified Eustachian tube disorder, bilateral: Secondary | ICD-10-CM | POA: Diagnosis not present

## 2018-05-24 DIAGNOSIS — H903 Sensorineural hearing loss, bilateral: Secondary | ICD-10-CM | POA: Diagnosis not present

## 2018-05-25 DIAGNOSIS — R55 Syncope and collapse: Secondary | ICD-10-CM | POA: Diagnosis not present

## 2018-05-26 DIAGNOSIS — R55 Syncope and collapse: Secondary | ICD-10-CM | POA: Diagnosis not present

## 2018-05-30 DIAGNOSIS — H6992 Unspecified Eustachian tube disorder, left ear: Secondary | ICD-10-CM | POA: Diagnosis not present

## 2018-06-16 DIAGNOSIS — Z125 Encounter for screening for malignant neoplasm of prostate: Secondary | ICD-10-CM | POA: Diagnosis not present

## 2018-06-16 DIAGNOSIS — Z0000 Encounter for general adult medical examination without abnormal findings: Secondary | ICD-10-CM | POA: Diagnosis not present

## 2018-06-16 DIAGNOSIS — E1165 Type 2 diabetes mellitus with hyperglycemia: Secondary | ICD-10-CM | POA: Diagnosis not present

## 2018-06-21 DIAGNOSIS — I10 Essential (primary) hypertension: Secondary | ICD-10-CM | POA: Diagnosis not present

## 2018-06-21 DIAGNOSIS — E118 Type 2 diabetes mellitus with unspecified complications: Secondary | ICD-10-CM | POA: Diagnosis not present

## 2018-06-21 DIAGNOSIS — Z1212 Encounter for screening for malignant neoplasm of rectum: Secondary | ICD-10-CM | POA: Diagnosis not present

## 2018-06-21 DIAGNOSIS — E7800 Pure hypercholesterolemia, unspecified: Secondary | ICD-10-CM | POA: Diagnosis not present

## 2018-06-21 DIAGNOSIS — Z0000 Encounter for general adult medical examination without abnormal findings: Secondary | ICD-10-CM | POA: Diagnosis not present

## 2018-06-21 DIAGNOSIS — Z23 Encounter for immunization: Secondary | ICD-10-CM | POA: Diagnosis not present

## 2018-07-11 DIAGNOSIS — J309 Allergic rhinitis, unspecified: Secondary | ICD-10-CM | POA: Diagnosis not present

## 2018-07-12 DIAGNOSIS — R55 Syncope and collapse: Secondary | ICD-10-CM | POA: Diagnosis not present

## 2018-07-12 DIAGNOSIS — I10 Essential (primary) hypertension: Secondary | ICD-10-CM | POA: Diagnosis not present

## 2018-07-19 DIAGNOSIS — J309 Allergic rhinitis, unspecified: Secondary | ICD-10-CM | POA: Diagnosis not present

## 2018-08-01 DIAGNOSIS — J309 Allergic rhinitis, unspecified: Secondary | ICD-10-CM | POA: Diagnosis not present

## 2018-08-08 DIAGNOSIS — J309 Allergic rhinitis, unspecified: Secondary | ICD-10-CM | POA: Diagnosis not present

## 2018-08-11 DIAGNOSIS — L4050 Arthropathic psoriasis, unspecified: Secondary | ICD-10-CM | POA: Diagnosis not present

## 2018-08-11 DIAGNOSIS — Z79899 Other long term (current) drug therapy: Secondary | ICD-10-CM | POA: Diagnosis not present

## 2018-08-11 DIAGNOSIS — L409 Psoriasis, unspecified: Secondary | ICD-10-CM | POA: Diagnosis not present

## 2018-08-11 DIAGNOSIS — M1990 Unspecified osteoarthritis, unspecified site: Secondary | ICD-10-CM | POA: Diagnosis not present

## 2018-09-19 DIAGNOSIS — J309 Allergic rhinitis, unspecified: Secondary | ICD-10-CM | POA: Diagnosis not present

## 2018-09-22 DIAGNOSIS — F411 Generalized anxiety disorder: Secondary | ICD-10-CM | POA: Diagnosis not present

## 2018-09-22 DIAGNOSIS — Z23 Encounter for immunization: Secondary | ICD-10-CM | POA: Diagnosis not present

## 2018-09-22 DIAGNOSIS — E118 Type 2 diabetes mellitus with unspecified complications: Secondary | ICD-10-CM | POA: Diagnosis not present

## 2018-10-10 DIAGNOSIS — J309 Allergic rhinitis, unspecified: Secondary | ICD-10-CM | POA: Diagnosis not present

## 2018-10-27 DIAGNOSIS — J300 Vasomotor rhinitis: Secondary | ICD-10-CM | POA: Diagnosis not present

## 2018-10-31 DIAGNOSIS — F419 Anxiety disorder, unspecified: Secondary | ICD-10-CM | POA: Diagnosis not present

## 2018-11-14 DIAGNOSIS — F329 Major depressive disorder, single episode, unspecified: Secondary | ICD-10-CM | POA: Diagnosis not present

## 2018-11-14 DIAGNOSIS — F419 Anxiety disorder, unspecified: Secondary | ICD-10-CM | POA: Diagnosis not present

## 2018-11-16 DIAGNOSIS — M25511 Pain in right shoulder: Secondary | ICD-10-CM | POA: Diagnosis not present

## 2018-11-16 DIAGNOSIS — M13811 Other specified arthritis, right shoulder: Secondary | ICD-10-CM | POA: Diagnosis not present

## 2018-11-22 DIAGNOSIS — J309 Allergic rhinitis, unspecified: Secondary | ICD-10-CM | POA: Diagnosis not present

## 2018-11-29 DIAGNOSIS — F419 Anxiety disorder, unspecified: Secondary | ICD-10-CM | POA: Diagnosis not present

## 2018-11-29 DIAGNOSIS — F329 Major depressive disorder, single episode, unspecified: Secondary | ICD-10-CM | POA: Diagnosis not present

## 2018-12-20 DIAGNOSIS — F439 Reaction to severe stress, unspecified: Secondary | ICD-10-CM | POA: Diagnosis not present

## 2018-12-20 DIAGNOSIS — E7800 Pure hypercholesterolemia, unspecified: Secondary | ICD-10-CM | POA: Diagnosis not present

## 2018-12-20 DIAGNOSIS — E118 Type 2 diabetes mellitus with unspecified complications: Secondary | ICD-10-CM | POA: Diagnosis not present

## 2018-12-20 DIAGNOSIS — F419 Anxiety disorder, unspecified: Secondary | ICD-10-CM | POA: Diagnosis not present

## 2018-12-20 DIAGNOSIS — R718 Other abnormality of red blood cells: Secondary | ICD-10-CM | POA: Diagnosis not present

## 2018-12-20 DIAGNOSIS — I10 Essential (primary) hypertension: Secondary | ICD-10-CM | POA: Diagnosis not present

## 2018-12-26 DIAGNOSIS — J309 Allergic rhinitis, unspecified: Secondary | ICD-10-CM | POA: Diagnosis not present

## 2018-12-28 DIAGNOSIS — Z79899 Other long term (current) drug therapy: Secondary | ICD-10-CM | POA: Diagnosis not present

## 2018-12-28 DIAGNOSIS — M1990 Unspecified osteoarthritis, unspecified site: Secondary | ICD-10-CM | POA: Diagnosis not present

## 2018-12-28 DIAGNOSIS — L4050 Arthropathic psoriasis, unspecified: Secondary | ICD-10-CM | POA: Diagnosis not present

## 2018-12-28 DIAGNOSIS — L409 Psoriasis, unspecified: Secondary | ICD-10-CM | POA: Diagnosis not present

## 2019-01-01 DIAGNOSIS — J309 Allergic rhinitis, unspecified: Secondary | ICD-10-CM | POA: Diagnosis not present

## 2019-01-09 DIAGNOSIS — J309 Allergic rhinitis, unspecified: Secondary | ICD-10-CM | POA: Diagnosis not present

## 2019-01-22 DIAGNOSIS — J309 Allergic rhinitis, unspecified: Secondary | ICD-10-CM | POA: Diagnosis not present

## 2019-01-26 DIAGNOSIS — J302 Other seasonal allergic rhinitis: Secondary | ICD-10-CM | POA: Diagnosis not present

## 2019-01-26 DIAGNOSIS — R140 Abdominal distension (gaseous): Secondary | ICD-10-CM | POA: Diagnosis not present

## 2019-01-26 DIAGNOSIS — R0982 Postnasal drip: Secondary | ICD-10-CM | POA: Diagnosis not present

## 2019-01-26 DIAGNOSIS — K219 Gastro-esophageal reflux disease without esophagitis: Secondary | ICD-10-CM | POA: Diagnosis not present

## 2019-01-27 DIAGNOSIS — Z1159 Encounter for screening for other viral diseases: Secondary | ICD-10-CM | POA: Diagnosis not present

## 2019-02-12 DIAGNOSIS — L4050 Arthropathic psoriasis, unspecified: Secondary | ICD-10-CM | POA: Diagnosis not present

## 2019-02-12 DIAGNOSIS — Z79899 Other long term (current) drug therapy: Secondary | ICD-10-CM | POA: Diagnosis not present

## 2019-02-12 DIAGNOSIS — L409 Psoriasis, unspecified: Secondary | ICD-10-CM | POA: Diagnosis not present

## 2019-02-12 DIAGNOSIS — M1990 Unspecified osteoarthritis, unspecified site: Secondary | ICD-10-CM | POA: Diagnosis not present

## 2019-02-15 DIAGNOSIS — R0982 Postnasal drip: Secondary | ICD-10-CM | POA: Diagnosis not present

## 2019-02-15 DIAGNOSIS — K219 Gastro-esophageal reflux disease without esophagitis: Secondary | ICD-10-CM | POA: Diagnosis not present

## 2019-02-15 DIAGNOSIS — R748 Abnormal levels of other serum enzymes: Secondary | ICD-10-CM | POA: Diagnosis not present

## 2019-02-15 DIAGNOSIS — F411 Generalized anxiety disorder: Secondary | ICD-10-CM | POA: Diagnosis not present

## 2019-02-15 IMAGING — MR MR BRAIN/IAC WO/W
12 series · 35 of 48 positions shown · IV contrast (multihance)
Comparison: Prior CT from 06/07/2016.

CLINICAL DATA: Initial evaluation for ringing in left ear. Rule out
acoustic neuroma.

EXAM:
MRI HEAD WITHOUT AND WITH CONTRAST
TECHNIQUE: Multiplanar, multiecho pulse sequences of the brain and surrounding
structures were obtained without and with intravenous contrast. An
IAC protocol was utilized.
CONTRAST:  18mL MULTIHANCE GADOBENATE DIMEGLUMINE 529 MG/ML IV SOLN

[Series 2: T1 · sagittal · 5.0mm · 0.45mm/px · 3 of 21 slices shown (1 of 4)]
[im 1/21]
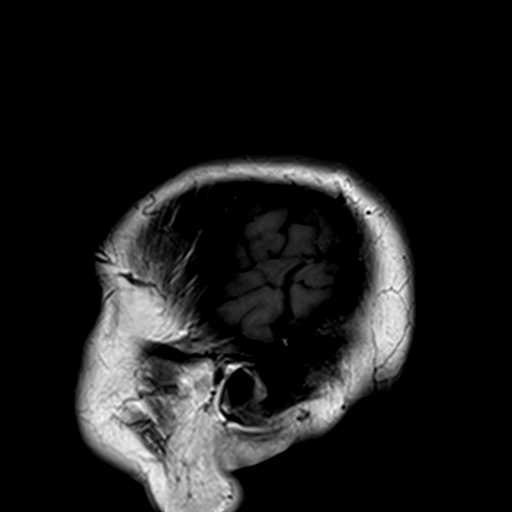
[im 11/21]
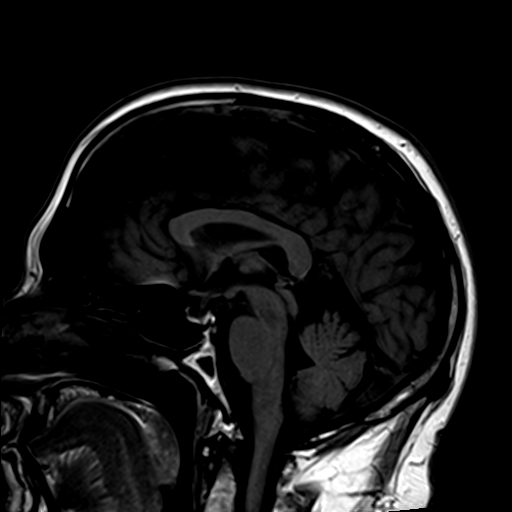
[im 21/21]
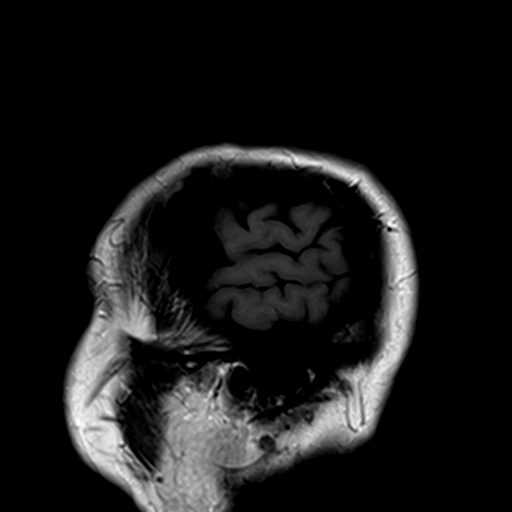

[Series 3: [id number]_512 · axial · 5.0mm · 0.60mm/px · z∈[-58,+81]mm · 2 of 24 slices shown]
[im 1/24]
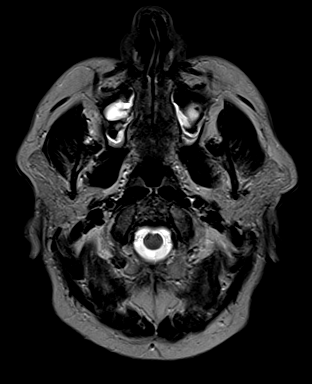
[im 24/24]
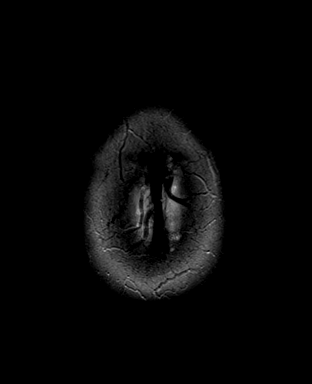

[Series 4: ep2d_diff_(id)_trace · axial · 3.0mm · 1.80mm/px · z∈[-60,+83]mm · 8 of 97 slices shown]
[im 1/97]
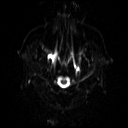
[im 13/97]
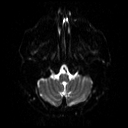
[im 25/97]
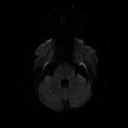
[im 37/97]
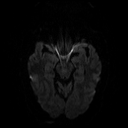
[im 61/97]
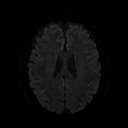
[im 73/97]
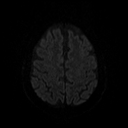
[im 85/97]
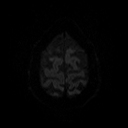
[im 97/97]
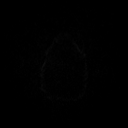

[Series 5: ep2d_diff_(id)_trace_adc · axial · 3.0mm · 1.80mm/px · z∈[-60,+83]mm · 5 of 50 slices shown]
[im 1/50]
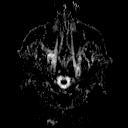
[im 13/50]
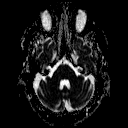
[im 25/50]
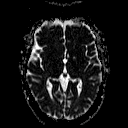
[im 37/50]
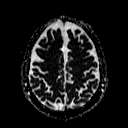
[im 50/50]
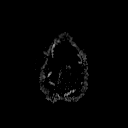

[Series 7: swi_images · axial · 3.0mm · 0.90mm/px · z∈[-63,+86]mm · 5 of 52 slices shown]
[im 1/52]
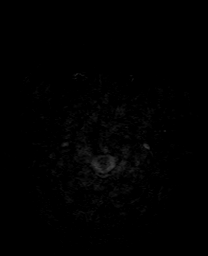
[im 13/52]
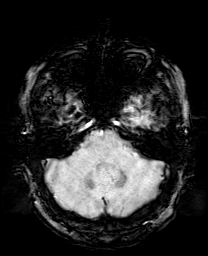
[im 26/52]
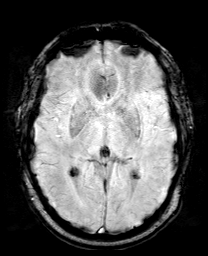
[im 39/52]
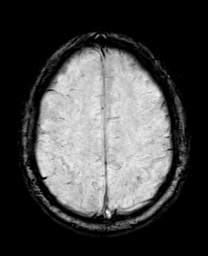
[im 52/52]
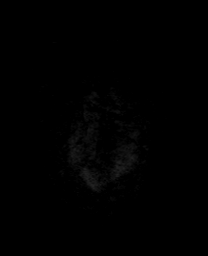

[Series 8: T1 · coronal · 3.0mm · 0.35mm/px · 1 of 11 slices shown (2 of 4)]
[im 1/11]
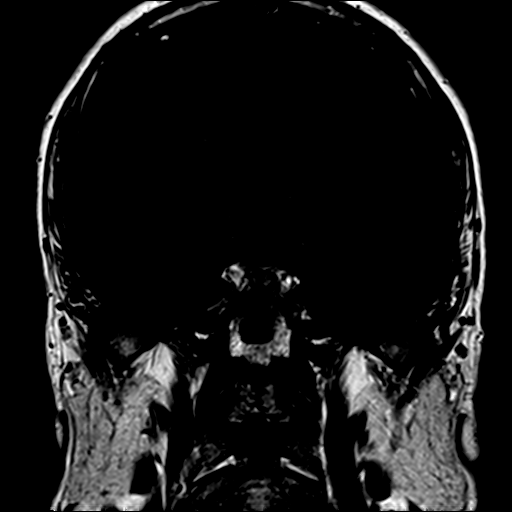

[Series 9: FLAIR · axial · 3.0mm · 0.43mm/px · z∈[-64,+85]mm · 3 of 30 slices shown]
[im 1/30]
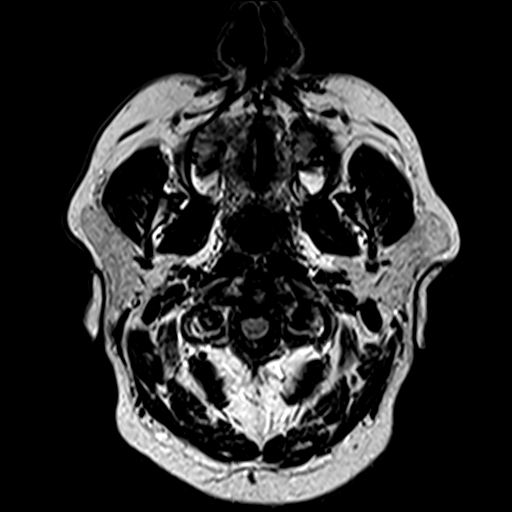
[im 15/30]
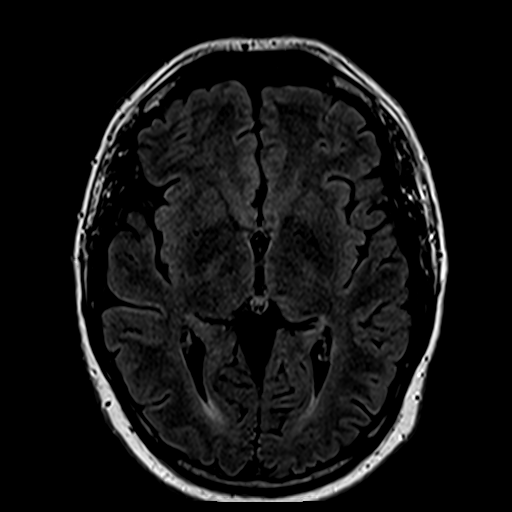
[im 30/30]
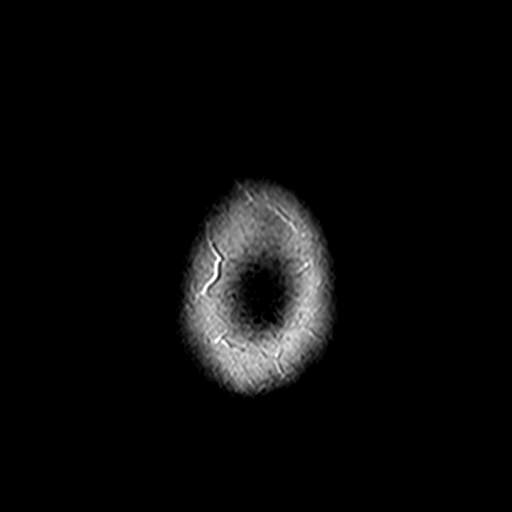

[Series 10: T1 · axial · 3.0mm · 0.35mm/px · 1 of 11 slices shown (3 of 4)]
[im 1/11]
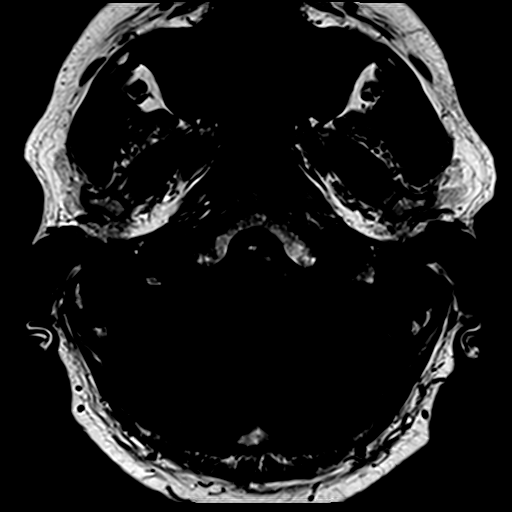

[Series 11: bSSFP · axial · 1.0mm · 0.28mm/px · z∈[-63,-32]mm · 3 of 32 slices shown]
[im 1/32]
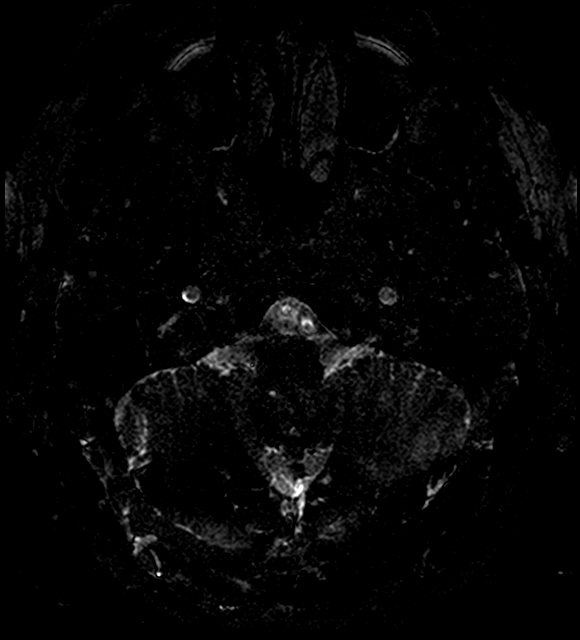
[im 16/32]
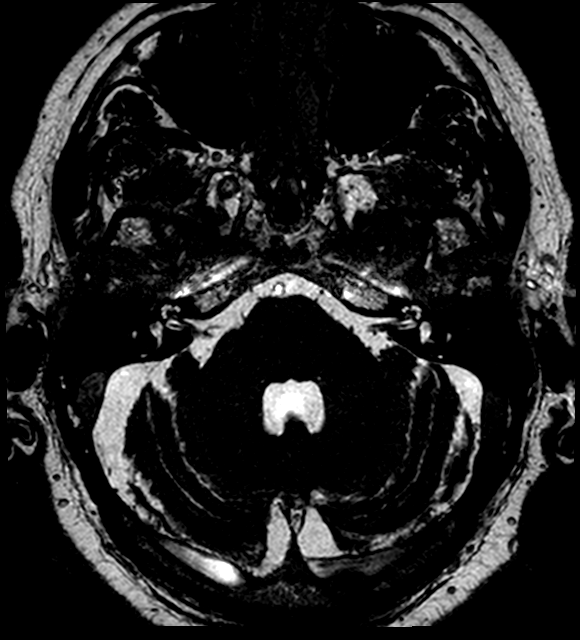
[im 32/32]
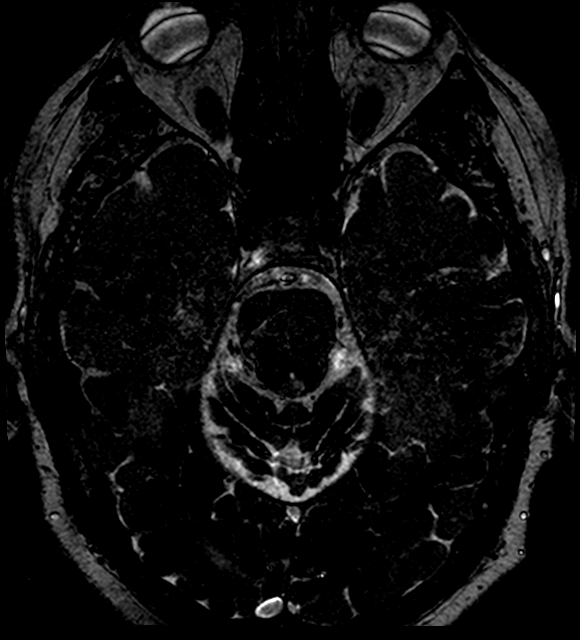

[Series 12: T1 · coronal · 3.0mm · 0.35mm/px · 1 of 11 slices shown (4 of 4)]
[im 1/11]
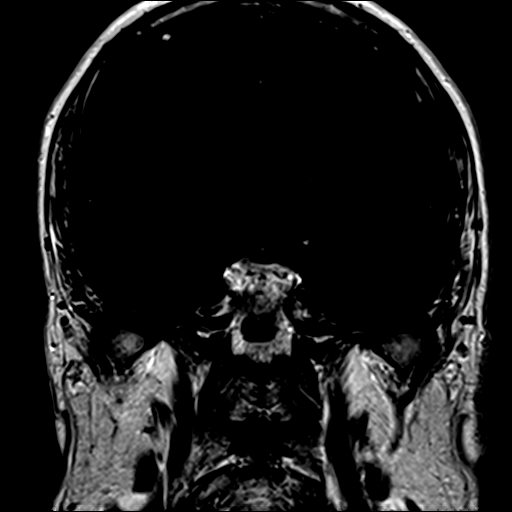

[Series 13: T1 post-contrast · axial · 3.0mm · 0.35mm/px · 1 of 11 slices shown]
[im 1/11]
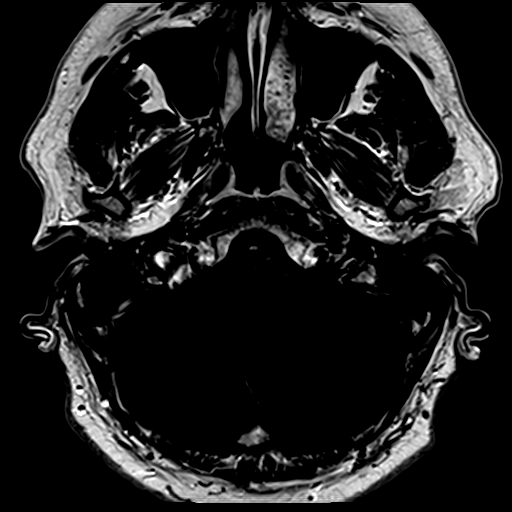

[Series 14: t1_mpr_tra · axial · 1.1mm · 0.72mm/px · z∈[-65,-41]mm · 2 of 144 slices shown]
[im 1/144]
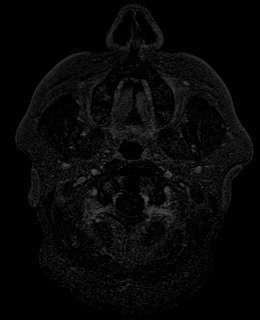
[im 23/144]
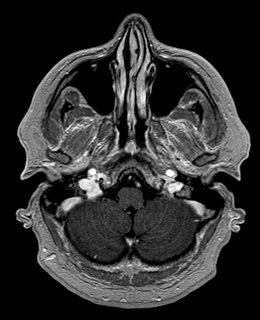

[35 of 48 positions shown; findings below may reference images not displayed]

FINDINGS: Brain: Cerebral volume within normal limits for patient age. No
focal parenchymal signal abnormality identified. No abnormal foci of
restricted diffusion to suggest acute or subacute ischemia.
Gray-white matter differentiation well maintained. No
encephalomalacia to suggest chronic infarction. No foci of
susceptibility artifact to suggest acute or chronic intracranial
hemorrhage.

Mass lesion, midline shift or mass effect. No hydrocephalus. No
extra-axial fluid collection. Major dural sinuses are grossly
patent.

Incidental note made of a partially empty sella. Midline structures
intact and normal. No abnormal enhancement within the brain.

Thin section imaging through the internal auditory canals was
performed. Seventh and eighth cranial nerves are seen coursing
normally through the cerebellopontine angle cisterns into the
internal auditory canals. No CPA angle mass. No intracanalicular
mass or abnormal enhancement. Inner ear structures including the
vestibulae, cochlea, and semi circular canals are normal. Normal
post ganglionic enhancement seen within the seventh cranial nerves
bilaterally. No significant mastoid effusion.

Vascular: Major intracranial vascular flow voids well maintained and
normal in appearance.

Skull and upper cervical spine: Craniocervical junction normal.
Visualized upper cervical spine within normal limits.
Well-circumscribed sclerotic lesion noted within the clivus, stable
from previous, likely benign. No scalp soft tissue abnormality.

Sinuses/Orbits: Globes and orbital soft tissues within normal
limits. Mild scattered mucosal thickening within the ethmoidal air
cells, sphenoid sinuses, and maxillary sinuses. Bilateral maxillary
sinus retention cyst partially visualized.

Other: None.
IMPRESSION: 1. Negative IAC protocol MRI of the brain. No evidence for acoustic
neuroma or other structure abnormality to explain patient's
symptoms.
2. No other acute intracranial abnormality.

## 2019-02-20 DIAGNOSIS — J309 Allergic rhinitis, unspecified: Secondary | ICD-10-CM | POA: Diagnosis not present

## 2019-03-05 ENCOUNTER — Telehealth: Payer: Self-pay | Admitting: Gastroenterology

## 2019-03-05 NOTE — Telephone Encounter (Signed)
Pt's wife Hassan Rowan called stating that pt has been urinating blood since the past three weeks, she noticed that urine is pinkish in color, a week ago pt began having bloody stools so she is getting concerned, she states that his stomach has expanded a lot and he is extremely bloated and having a bit of abd pain. Pt is scheduled to see Dr. Tarri Glenn as a new pt on 8/20 but is wondering if there is something sooner.

## 2019-03-06 NOTE — Telephone Encounter (Signed)
The patient will reach out to his PCP.

## 2019-03-06 NOTE — Telephone Encounter (Signed)
This is a new patient that has an appointment with Dr. Tarri Glenn on 8/20, he c/o slowly worsening bloating and gas for 2 months. Approximately 2 weeks ago, the patient's urine progressed from yellow to dark amber in color with some intermittent dysuria along with the start of "wet greasy black stools." The patient states that he is stooling 6-10 times daily with lots of gas and urgency. He reports he cannot tell if he has gas or has to stool but always expels "wet greasy bloody stool"  The patient reports generalized abd pain that is tolerable laying flat and is worsened sitting or standing up. The pain is constant and rated at a 7-8 out of 10. He says that his stomach is visibly distended, rotund, firm and "tight." The patient reports black stools, severely decreased appetite but able to drink small amounts. The patient has not had any recent procedures or surgeries. This RN alerted the patient that the recommendation will most likely be going to the ED for evaluation. The patient is apprehensive about the ED visit but is agreeable with this plan if it is recommended. Please advise.

## 2019-03-06 NOTE — Telephone Encounter (Signed)
I recommend that he speak to his PCP now for further advice.

## 2019-03-07 ENCOUNTER — Inpatient Hospital Stay (HOSPITAL_BASED_OUTPATIENT_CLINIC_OR_DEPARTMENT_OTHER): Payer: BC Managed Care – PPO

## 2019-03-07 ENCOUNTER — Other Ambulatory Visit: Payer: Self-pay

## 2019-03-07 ENCOUNTER — Emergency Department (HOSPITAL_BASED_OUTPATIENT_CLINIC_OR_DEPARTMENT_OTHER): Payer: BC Managed Care – PPO

## 2019-03-07 ENCOUNTER — Inpatient Hospital Stay (HOSPITAL_BASED_OUTPATIENT_CLINIC_OR_DEPARTMENT_OTHER)
Admission: EM | Admit: 2019-03-07 | Discharge: 2019-03-09 | DRG: 434 | Disposition: A | Discharge status: Home / Self Care | Payer: BC Managed Care – PPO | Attending: Student | Admitting: Student

## 2019-03-07 ENCOUNTER — Encounter (HOSPITAL_BASED_OUTPATIENT_CLINIC_OR_DEPARTMENT_OTHER): Payer: Self-pay

## 2019-03-07 DIAGNOSIS — M1990 Unspecified osteoarthritis, unspecified site: Secondary | ICD-10-CM | POA: Diagnosis present

## 2019-03-07 DIAGNOSIS — E785 Hyperlipidemia, unspecified: Secondary | ICD-10-CM | POA: Diagnosis present

## 2019-03-07 DIAGNOSIS — K760 Fatty (change of) liver, not elsewhere classified: Secondary | ICD-10-CM | POA: Diagnosis not present

## 2019-03-07 DIAGNOSIS — Z8052 Family history of malignant neoplasm of bladder: Secondary | ICD-10-CM | POA: Diagnosis not present

## 2019-03-07 DIAGNOSIS — Z803 Family history of malignant neoplasm of breast: Secondary | ICD-10-CM

## 2019-03-07 DIAGNOSIS — R160 Hepatomegaly, not elsewhere classified: Secondary | ICD-10-CM | POA: Diagnosis not present

## 2019-03-07 DIAGNOSIS — Z8349 Family history of other endocrine, nutritional and metabolic diseases: Secondary | ICD-10-CM | POA: Diagnosis not present

## 2019-03-07 DIAGNOSIS — Z7982 Long term (current) use of aspirin: Secondary | ICD-10-CM

## 2019-03-07 DIAGNOSIS — F1010 Alcohol abuse, uncomplicated: Secondary | ICD-10-CM | POA: Diagnosis present

## 2019-03-07 DIAGNOSIS — Z87891 Personal history of nicotine dependence: Secondary | ICD-10-CM

## 2019-03-07 DIAGNOSIS — R748 Abnormal levels of other serum enzymes: Secondary | ICD-10-CM | POA: Diagnosis not present

## 2019-03-07 DIAGNOSIS — E1159 Type 2 diabetes mellitus with other circulatory complications: Secondary | ICD-10-CM | POA: Diagnosis not present

## 2019-03-07 DIAGNOSIS — L4050 Arthropathic psoriasis, unspecified: Secondary | ICD-10-CM | POA: Diagnosis present

## 2019-03-07 DIAGNOSIS — K219 Gastro-esophageal reflux disease without esophagitis: Secondary | ICD-10-CM | POA: Diagnosis present

## 2019-03-07 DIAGNOSIS — K7011 Alcoholic hepatitis with ascites: Secondary | ICD-10-CM

## 2019-03-07 DIAGNOSIS — R109 Unspecified abdominal pain: Secondary | ICD-10-CM | POA: Diagnosis not present

## 2019-03-07 DIAGNOSIS — R140 Abdominal distension (gaseous): Secondary | ICD-10-CM

## 2019-03-07 DIAGNOSIS — R945 Abnormal results of liver function studies: Secondary | ICD-10-CM | POA: Diagnosis present

## 2019-03-07 DIAGNOSIS — R188 Other ascites: Secondary | ICD-10-CM | POA: Diagnosis not present

## 2019-03-07 DIAGNOSIS — K7031 Alcoholic cirrhosis of liver with ascites: Principal | ICD-10-CM | POA: Diagnosis present

## 2019-03-07 DIAGNOSIS — R7401 Elevation of levels of liver transaminase levels: Secondary | ICD-10-CM

## 2019-03-07 DIAGNOSIS — Z0389 Encounter for observation for other suspected diseases and conditions ruled out: Secondary | ICD-10-CM | POA: Diagnosis not present

## 2019-03-07 DIAGNOSIS — R740 Nonspecific elevation of levels of transaminase and lactic acid dehydrogenase [LDH]: Secondary | ICD-10-CM | POA: Diagnosis not present

## 2019-03-07 DIAGNOSIS — R197 Diarrhea, unspecified: Secondary | ICD-10-CM

## 2019-03-07 DIAGNOSIS — R1010 Upper abdominal pain, unspecified: Secondary | ICD-10-CM | POA: Diagnosis not present

## 2019-03-07 DIAGNOSIS — R161 Splenomegaly, not elsewhere classified: Secondary | ICD-10-CM | POA: Diagnosis not present

## 2019-03-07 DIAGNOSIS — I10 Essential (primary) hypertension: Secondary | ICD-10-CM | POA: Diagnosis present

## 2019-03-07 DIAGNOSIS — I81 Portal vein thrombosis: Secondary | ICD-10-CM | POA: Insufficient documentation

## 2019-03-07 DIAGNOSIS — F419 Anxiety disorder, unspecified: Secondary | ICD-10-CM | POA: Diagnosis present

## 2019-03-07 DIAGNOSIS — R0602 Shortness of breath: Secondary | ICD-10-CM | POA: Diagnosis not present

## 2019-03-07 DIAGNOSIS — R8290 Unspecified abnormal findings in urine: Secondary | ICD-10-CM | POA: Diagnosis not present

## 2019-03-07 DIAGNOSIS — Z7984 Long term (current) use of oral hypoglycemic drugs: Secondary | ICD-10-CM | POA: Diagnosis not present

## 2019-03-07 DIAGNOSIS — Z880 Allergy status to penicillin: Secondary | ICD-10-CM | POA: Diagnosis not present

## 2019-03-07 DIAGNOSIS — Z7951 Long term (current) use of inhaled steroids: Secondary | ICD-10-CM | POA: Diagnosis not present

## 2019-03-07 DIAGNOSIS — K625 Hemorrhage of anus and rectum: Secondary | ICD-10-CM | POA: Diagnosis not present

## 2019-03-07 DIAGNOSIS — Z20828 Contact with and (suspected) exposure to other viral communicable diseases: Secondary | ICD-10-CM | POA: Diagnosis present

## 2019-03-07 DIAGNOSIS — K828 Other specified diseases of gallbladder: Secondary | ICD-10-CM | POA: Diagnosis not present

## 2019-03-07 HISTORY — DX: Fatty (change of) liver, not elsewhere classified: K76.0

## 2019-03-07 HISTORY — DX: Psoriasis, unspecified: L40.9

## 2019-03-07 LAB — URINALYSIS, ROUTINE W REFLEX MICROSCOPIC
Glucose, UA: 100 mg/dL — AB
Hgb urine dipstick: NEGATIVE
Ketones, ur: 15 mg/dL — AB
Leukocytes,Ua: NEGATIVE
Nitrite: POSITIVE — AB
Protein, ur: 30 mg/dL — AB
Specific Gravity, Urine: 1.02 (ref 1.005–1.030)
pH: 5.5 (ref 5.0–8.0)

## 2019-03-07 LAB — CBC WITH DIFFERENTIAL/PLATELET
Abs Immature Granulocytes: 0.02 10*3/uL (ref 0.00–0.07)
Basophils Absolute: 0.1 10*3/uL (ref 0.0–0.1)
Basophils Relative: 1 %
Eosinophils Absolute: 0.4 10*3/uL (ref 0.0–0.5)
Eosinophils Relative: 4 %
HCT: 37.2 % — ABNORMAL LOW (ref 39.0–52.0)
Hemoglobin: 12.4 g/dL — ABNORMAL LOW (ref 13.0–17.0)
Immature Granulocytes: 0 %
Lymphocytes Relative: 18 %
Lymphs Abs: 1.7 10*3/uL (ref 0.7–4.0)
MCH: 34.9 pg — ABNORMAL HIGH (ref 26.0–34.0)
MCHC: 33.3 g/dL (ref 30.0–36.0)
MCV: 104.8 fL — ABNORMAL HIGH (ref 80.0–100.0)
Monocytes Absolute: 0.9 10*3/uL (ref 0.1–1.0)
Monocytes Relative: 9 %
Neutro Abs: 6.7 10*3/uL (ref 1.7–7.7)
Neutrophils Relative %: 68 %
Platelets: 242 10*3/uL (ref 150–400)
RBC: 3.55 MIL/uL — ABNORMAL LOW (ref 4.22–5.81)
RDW: 14.3 % (ref 11.5–15.5)
WBC: 9.8 10*3/uL (ref 4.0–10.5)
nRBC: 0 % (ref 0.0–0.2)

## 2019-03-07 LAB — COMPREHENSIVE METABOLIC PANEL
ALT: 48 U/L — ABNORMAL HIGH (ref 0–44)
AST: 129 U/L — ABNORMAL HIGH (ref 15–41)
Albumin: 3.5 g/dL (ref 3.5–5.0)
Alkaline Phosphatase: 209 U/L — ABNORMAL HIGH (ref 38–126)
Anion gap: 11 (ref 5–15)
BUN: 6 mg/dL (ref 6–20)
CO2: 23 mmol/L (ref 22–32)
Calcium: 8.2 mg/dL — ABNORMAL LOW (ref 8.9–10.3)
Chloride: 105 mmol/L (ref 98–111)
Creatinine, Ser: 0.64 mg/dL (ref 0.61–1.24)
GFR calc Af Amer: >60 mL/min (ref >60)
GFR calc non Af Amer: >60 mL/min (ref >60)
Glucose, Bld: 107 mg/dL — ABNORMAL HIGH (ref 70–99)
Potassium: 3.9 mmol/L (ref 3.5–5.1)
Sodium: 139 mmol/L (ref 135–145)
Total Bilirubin: 1.6 mg/dL — ABNORMAL HIGH (ref 0.3–1.2)
Total Protein: 6.6 g/dL (ref 6.5–8.1)

## 2019-03-07 LAB — OCCULT BLOOD X 1 CARD TO LAB, STOOL: Fecal Occult Bld: NEGATIVE

## 2019-03-07 LAB — URINALYSIS, MICROSCOPIC (REFLEX)

## 2019-03-07 LAB — SARS CORONAVIRUS 2 BY RT PCR (HOSPITAL ORDER, PERFORMED IN ~~LOC~~ HOSPITAL LAB): SARS Coronavirus 2: NEGATIVE

## 2019-03-07 LAB — PROTIME-INR
INR: 1.2 (ref 0.8–1.2)
Prothrombin Time: 14.9 seconds (ref 11.4–15.2)

## 2019-03-07 LAB — LIPASE, BLOOD: Lipase: 45 U/L (ref 11–51)

## 2019-03-07 MED ORDER — IOHEXOL 300 MG/ML  SOLN
100.0000 mL | Freq: Once | INTRAMUSCULAR | Status: AC | PRN
  Administered 2019-03-07: 16:00:00 100 mL via INTRAVENOUS

## 2019-03-07 MED ORDER — SODIUM CHLORIDE 0.9 % IV SOLN
INTRAVENOUS | Status: DC | PRN
  Administered 2019-03-07 – 2019-03-08 (×2): 1000 mL via INTRAVENOUS

## 2019-03-07 MED ORDER — ENOXAPARIN SODIUM 40 MG/0.4ML ~~LOC~~ SOLN: 40.0000 mg | SUBCUTANEOUS | Status: DC

## 2019-03-07 MED ORDER — ONDANSETRON HCL 4 MG/2ML IJ SOLN
4.0000 mg | Freq: Once | INTRAMUSCULAR | Status: AC
  Administered 2019-03-07: 4 mg via INTRAVENOUS
  Filled 2019-03-07: qty 2

## 2019-03-07 MED ORDER — MORPHINE SULFATE (PF) 2 MG/ML IV SOLN
2.0000 mg | Freq: Once | INTRAVENOUS | Status: AC
  Administered 2019-03-07: 2 mg via INTRAVENOUS
  Filled 2019-03-07: qty 1

## 2019-03-07 MED ORDER — SODIUM CHLORIDE 0.9% FLUSH
3.0000 mL | Freq: Two times a day (BID) | INTRAVENOUS | Status: DC
  Administered 2019-03-08 – 2019-03-09 (×4): 3 mL via INTRAVENOUS

## 2019-03-07 MED ORDER — HYDROMORPHONE HCL 1 MG/ML IJ SOLN
1.0000 mg | Freq: Once | INTRAMUSCULAR | Status: AC
  Administered 2019-03-07: 1 mg via INTRAVENOUS
  Filled 2019-03-07: qty 1

## 2019-03-07 MED ORDER — SODIUM CHLORIDE 0.9 % IV SOLN
1.0000 g | INTRAVENOUS | Status: DC
  Administered 2019-03-07 – 2019-03-08 (×2): 1 g via INTRAVENOUS
  Filled 2019-03-07 (×3): qty 10

## 2019-03-07 MED ORDER — MORPHINE SULFATE (PF) 4 MG/ML IV SOLN
4.0000 mg | Freq: Once | INTRAVENOUS | Status: AC
  Administered 2019-03-07: 4 mg via INTRAVENOUS
  Filled 2019-03-07: qty 1

## 2019-03-07 MED ORDER — MORPHINE SULFATE (PF) 4 MG/ML IV SOLN
4.0000 mg | Freq: Once | INTRAVENOUS | Status: DC
  Filled 2019-03-07 (×2): qty 1

## 2019-03-07 MED ORDER — SODIUM CHLORIDE 0.9 % IV SOLN: 250.0000 mL | INTRAVENOUS | Status: DC | PRN

## 2019-03-07 MED ORDER — SODIUM CHLORIDE 0.9% FLUSH: 3.0000 mL | INTRAVENOUS | Status: DC | PRN

## 2019-03-07 MED ORDER — SODIUM CHLORIDE 0.9 % IV BOLUS
500.0000 mL | Freq: Once | INTRAVENOUS | Status: AC
  Administered 2019-03-07: 500 mL via INTRAVENOUS

## 2019-03-07 MED ORDER — FENTANYL CITRATE (PF) 100 MCG/2ML IJ SOLN
100.0000 ug | Freq: Once | INTRAMUSCULAR | Status: AC
  Administered 2019-03-07: 100 ug via INTRAVENOUS
  Filled 2019-03-07: qty 2

## 2019-03-07 NOTE — H&P (Addendum)
TRH H&P    Patient Demographics:    Andrew Patton, is a 58 y.o. male  MRN: [id number]  DOB - 11-24-1960  Admit Date - 03/07/2019  Referring MD/NP/PA: Isla Pence  Outpatient Primary MD for the patient is Deland Pretty, MD Formerly Dr. Minna Antis  Patient coming from:  Home -. [place]  Chief complaint- abdominal pain,    HPI:    Andrew Patton  is a 58 y.o. male, w ? Psoriatic arthritis, hypertension, hyperlipidemia, glucose intolerance(hga1c=5.5 on 12/20/2018),  fatty liver , s/p normal colonoscopy Sharlett Iles) 02/07/2013, apparently c/o rectal bleeding since this past weekend starting Saturday evening, on toilet paper and in bowel.   Pt noted brbpr, as well as black stool.  Pt notes that he has however been taking peptobismol.   Pt thought that the blood was from his cosentx and stopped the cosentx on Sunday.  Pt states that the brbpr stopped 1-2 days ago.  Pt notes has had gas and bloating and diarrhea. (intermittent for 2 weeks)  Pt took some imodium and the diarrhea stopped. Pt has had abdominal pain for at least 4 weeks.  Pt states that most of the pain is in the under his ribs.  The pain was worse tonight and therefore he went to ER.    Wife notes that he was supposed to see Dr. Bertram Millard but he didn't go last year. Pt was signed up by Dr. Shelia Media to see [facility] but he hasn't seen them yet.   In ED,  T 99 P 91  R 20  Bp 153/93  Pox 99% on RA  CT scan abd/ pelvis IMPRESSION: 1. Hepatomegaly with inhomogeneous hepatic steatosis. 2. Extensive ascites.  Na 139, K 3.9, Bun 6, Creatinine 0.64 Ast 129, Alt 48, Alk phos 209, T. Bili 1.6  Lipase 45 INR 1.2  Urinalysis negative  covid -19 negative  Pt received rocephin iv x1  While in ED, to cover for SBP  Pt will be admitted for abdominal pain which is his primary complaint and likely due to ascites r/o SBP and rectal bleeding.     Review  of systems:    In addition to the HPI above,  No Fever-chills, No Headache, No changes with Vision or hearing, No problems swallowing food or Liquids, No Chest pain, Cough or Shortness of Breath, Slight nausea.  No emesis    No dysuria, No new skin rashes or bruises, No new joints pains-aches,  No new weakness, tingling, numbness in any extremity, No recent weight gain or loss, No polyuria, polydypsia or polyphagia, No significant Mental Stressors.  All other systems reviewed and are negative.    Past History of the following :    Past Medical History:  Diagnosis Date   Allergy    Anxiety    Arthritis    Fatty liver    Hyperlipidemia    Hypertension    Pre-diabetes    Psoriasis       Past Surgical History:  Procedure Laterality Date   APPENDECTOMY  NOSE SURGERY     ROTATOR CUFF REPAIR     left      Social History:      Social History   Tobacco Use   Smoking status: Former Smoker   Smokeless tobacco: Never Used  Substance Use Topics   Alcohol use: Yes    Alcohol/week: 2.0 standard drinks    Types: 2 Glasses of wine per week    Comment: daily       Family History :     Family History  Problem Relation Age of Onset   Breast cancer Mother    Hyperlipidemia Mother    Bladder Cancer Father    Birth defects Maternal Grandfather    Birth defects Paternal Grandmother    Colon cancer Neg Hx    Esophageal cancer Neg Hx    Rectal cancer Neg Hx    Stomach cancer Neg Hx        Home Medications:   Prior to Admission medications   Medication Sig Start Date End Date Taking? Authorizing Provider  aspirin 81 MG tablet Take 81 mg by mouth daily.   Yes [provider]  azelastine (ASTELIN) 0.1 % nasal spray Place 1 spray into both nostrils 2 (two) times daily. Use in each nostril as directed   Yes [provider]  busPIRone (BUSPAR) 15 MG tablet Take 15 mg by mouth 2 (two) times daily.    Yes [provider]  Cholecalciferol (VITAMIN D3) 1000 UNITS CAPS Take 1 capsule by mouth daily.    Yes [provider]  clonazePAM (KLONOPIN) 0.5 MG tablet Take 0.5 mg by mouth daily as needed for anxiety.    Yes [provider]  colesevelam (WELCHOL) 625 MG tablet Take 1,250 mg by mouth daily.   Yes [provider]  ezetimibe (ZETIA) 10 MG tablet Take 10 mg by mouth daily.   Yes [provider]  fluticasone (FLONASE) 50 MCG/ACT nasal spray Place 1 spray into both nostrils daily as needed for allergies.  01/05/13  Yes [provider]  Javier Docker Oil 500 MG CAPS Take 500 mg by mouth daily.   Yes [provider]  levocetirizine (XYZAL) 5 MG tablet Take 5 mg by mouth every evening.   Yes [provider]  metFORMIN (GLUCOPHAGE) 500 MG tablet Take 500 mg by mouth every evening.   Yes [provider]  olmesartan (BENICAR) 40 MG tablet Take 20 mg by mouth daily.    Yes [provider]  pantoprazole (PROTONIX) 40 MG tablet Take 40 mg by mouth daily.   Yes [provider]  Secukinumab (COSENTYX) 150 MG/ML SOSY Inject 150 mg into the skin every 30 (thirty) days.   Yes [provider]  vitamin E 1000 UNIT capsule Take 1,000 Units by mouth daily.   Yes [provider]  zolpidem (AMBIEN CR) 12.5 MG CR tablet Take 12.5 mg by mouth at bedtime as needed for sleep.   Yes [provider]  doxycycline (VIBRAMYCIN) 100 MG capsule Take 1 capsule (100 mg total) by mouth 2 (two) times daily. Patient not taking: Reported on 04/19/2017 04/06/17   Pisciotta, Elmyra Ricks, PA-C     Allergies:     Allergies  Allergen Reactions   Meloxicam Other (See Comments)    Leg pain   Penicillins Rash    Has patient had a PCN reaction causing immediate rash, facial/tongue/throat swelling, SOB or lightheadedness with hypotension: No Has patient had a PCN reaction causing severe rash involving mucus  membranes or skin necrosis:  No Has patient had a PCN reaction that required hospitalization: No Has patient had a PCN reaction occurring within the last 10 years: No If all of the above answers are "NO", then may proceed with Cephalosporin use.     Physical Exam:   Vitals  Blood pressure (!) 161/96, pulse 94, temperature 98.7 F (37.1 C), temperature source Oral, resp. rate 18, height '5\' 6"'$  (1.676 m), weight 92.8 kg, SpO2 93 %.  1.  General: axoxo3  2. Psychiatric: euthymic  3. Neurologic: cn2-12 intact, reflexes 2+ symmetric, diffuse with no clonus, motor 5/5 in all 4 ext  4. HEENMT:  Anicteric, pupils 1.52m symmetric, direct, consensual, near intact Neck: no jvd  5. Respiratory : CTAB  6. Cardiovascular : rrr s1, s2, no m/g/r  7. Gastrointestinal:  Abd: soft, distended, nt, +bs  8. Skin:  Ext: no c/c/e, no rash  9.Musculoskeletal:  Good ROM,  No adenopathy    Data Review:    CBC Recent Labs  Lab 03/07/19 1312  WBC 9.8  HGB 12.4*  HCT 37.2*  PLT 242  MCV 104.8*  MCH 34.9*  MCHC 33.3  RDW 14.3  LYMPHSABS 1.7  MONOABS 0.9  EOSABS 0.4  BASOSABS 0.1   ------------------------------------------------------------------------------------------------------------------  Results for orders placed or performed during the hospital encounter of 03/07/19 (from the past 48 hour(s))  Comprehensive metabolic panel     Status: Abnormal   Collection Time: 03/07/19  1:12 PM  Result Value Ref Range   Sodium 139 135 - 145 mmol/L   Potassium 3.9 3.5 - 5.1 mmol/L   Chloride 105 98 - 111 mmol/L   CO2 23 22 - 32 mmol/L   Glucose, Bld 107 (H) 70 - 99 mg/dL   BUN 6 6 - 20 mg/dL   Creatinine, Ser 0.64 0.61 - 1.24 mg/dL   Calcium 8.2 (L) 8.9 - 10.3 mg/dL   Total Protein 6.6 6.5 - 8.1 g/dL   Albumin 3.5 3.5 - 5.0 g/dL   AST 129 (H) 15 - 41 U/L   ALT 48 (H) 0 - 44 U/L   Alkaline Phosphatase 209 (H) 38 - 126 U/L   Total Bilirubin 1.6 (H) 0.3 - 1.2 mg/dL   GFR calc non Af Amer >60 >60 mL/min    GFR calc Af Amer >60 >60 mL/min   Anion gap 11 5 - 15    Comment: [facility]formed [facility] [id number] CBC with Differential     Status: Abnormal   Collection Time: 03/07/19  1:12 PM  Result Value Ref Range   WBC 9.8 4.0 - 10.5 K/uL   RBC 3.55 (L) 4.22 - 5.81 MIL/uL   Hemoglobin 12.4 (L) 13.0 - 17.0 g/dL   HCT 37.2 (L) 39.0 - 52.0 %   MCV 104.8 (H) 80.0 - 100.0 fL   MCH 34.9 (H) 26.0 - 34.0 pg   MCHC 33.3 30.0 - 36.0 g/dL   RDW 14.3 11.5 - 15.5 %   Platelets 242 150 - 400 K/uL   nRBC 0.0 0.0 - 0.2 %   Neutrophils Relative % 68 %   Neutro Abs 6.7 1.7 - 7.7 K/uL   Lymphocytes Relative 18 %   Lymphs Abs 1.7 0.7 - 4.0 K/uL   Monocytes Relative 9 %   Monocytes Absolute 0.9 0.1 - 1.0 K/uL   Eosinophils Relative 4 %   Eosinophils Absolute 0.4 0.0 - 0.5 K/uL   Basophils Relative  1 %   Basophils Absolute 0.1 0.0 - 0.1 K/uL   Immature Granulocytes 0 %   Abs Immature Granulocytes 0.02 0.00 - 0.07 K/uL    Comment: Performed at [facility], [address] [place], [place] [address]  Lipase, blood     Status: None   Collection Time: 03/07/19  1:12 PM  Result Value Ref Range   Lipase 45 11 - 51 U/L    Comment: Performed at [facility], [address] [place], [place] [address]  Protime-INR     Status: None   Collection Time: 03/07/19  1:12 PM  Result Value Ref Range   Prothrombin Time 14.9 11.4 - 15.2 seconds   INR 1.2 0.8 - 1.2    Comment: (NOTE) INR goal varies based on device and disease states. Performed at [facility], [address] [place], [place] [address]   Occult blood card to lab, stool Provider will collect     Status: None   Collection Time: 03/07/19  1:30 PM  Result Value Ref Range   Fecal Occult Bld NEGATIVE NEGATIVE    Comment: Performed at [facility], [address] [place], [place] [address]  Urinalysis, Routine w reflex microscopic     Status: Abnormal   Collection  Time: 03/07/19  4:30 PM  Result Value Ref Range   Color, Urine ORANGE (A) YELLOW    Comment: BIOCHEMICALS MAY BE AFFECTED BY COLOR   APPearance CLEAR CLEAR   Specific Gravity, Urine 1.020 1.005 - 1.030   pH 5.5 5.0 - 8.0   Glucose, UA 100 (A) NEGATIVE mg/dL   Hgb urine dipstick NEGATIVE NEGATIVE   Bilirubin Urine MODERATE (A) NEGATIVE   Ketones, ur 15 (A) NEGATIVE mg/dL   Protein, ur 30 (A) NEGATIVE mg/dL   Nitrite POSITIVE (A) NEGATIVE   Leukocytes,Ua NEGATIVE NEGATIVE    Comment: Performed at [facility], [address] [place], [place] [address]  Urinalysis, Microscopic (reflex)     Status: Abnormal   Collection Time: 03/07/19  4:30 PM  Result Value Ref Range   RBC / HPF 0-5 0 - 5 RBC/hpf   WBC, UA 0-5 0 - 5 WBC/hpf   Bacteria, UA FEW (A) NONE SEEN   Squamous Epithelial / LPF 0-5 0 - 5   Mucus PRESENT     Comment: Performed at [facility], [address] [place], [place] [address]  SARS Coronavirus 2 [facility] order, Performed in [facility] hospital lab) Nasopharyngeal Nasopharyngeal Swab     Status: None   Collection Time: 03/07/19  5:28 PM   Specimen: Nasopharyngeal Swab  Result Value Ref Range   SARS Coronavirus 2 NEGATIVE NEGATIVE    Comment: (NOTE) If result is NEGATIVE SARS-CoV-2 target nucleic acids are NOT DETECTED. The SARS-CoV-2 RNA is generally detectable in upper and lower  respiratory specimens during the acute phase of infection. The lowest  concentration of SARS-CoV-2 viral copies this assay can detect is 250  copies / mL. A negative result does not preclude SARS-CoV-2 infection  and should not be used as the sole basis for treatment or other  patient management decisions.  A negative result may occur with  improper specimen collection / handling, submission of specimen other  than nasopharyngeal swab, presence of viral mutation(s) within the  areas targeted by this assay, and inadequate number of viral copies  (<250 copies / mL).  A negative result  must be combined with clinical  observations, patient history, and epidemiological information. If result is POSITIVE SARS-CoV-2 target nucleic acids are DETECTED. The SARS-CoV-2 RNA is generally detectable in upper and lower  respiratory specimens dur ing the acute phase of infection.  Positive  results are indicative of active infection with SARS-CoV-2.  Clinical  correlation with patient history and other diagnostic information is  necessary to determine patient infection status.  Positive results do  not rule out bacterial infection or co-infection with other viruses. If result is PRESUMPTIVE POSTIVE SARS-CoV-2 nucleic acids MAY BE PRESENT.   A presumptive positive result was obtained on the submitted specimen  and confirmed on repeat testing.  While 2019 novel coronavirus  (SARS-CoV-2) nucleic acids may be present in the submitted sample  additional confirmatory testing may be necessary for epidemiological  and / or clinical management purposes  to differentiate between  SARS-CoV-2 and other Sarbecovirus currently known to infect humans.  If clinically indicated additional testing with an alternate test  methodology [phone]) is advised. The SARS-CoV-2 RNA is generally  detectable in upper and lower respiratory sp ecimens during the acute  phase of infection. The expected result is Negative. Fact Sheet for Patients:  StrictlyIdeas.no Fact Sheet for Healthcare Providers: BankingDealers.co.za This test is not yet approved or cleared by the [place] FDA and has been authorized for detection and/or diagnosis of SARS-CoV-2 by FDA under an Emergency Use Authorization (EUA).  This EUA will remain in effect (meaning this test can be used) for the duration of the COVID-19 declaration under Section 564(b)(1) of the Act, 21 U.S.C. section 360bbb-3(b)(1), unless the authorization is terminated or revoked  sooner. Performed at [facility], [address] [place], [place] [address]     Chemistries  Recent Labs  Lab 03/07/19 1312  NA 139  K 3.9  CL 105  CO2 23  GLUCOSE 107*  BUN 6  CREATININE 0.64  CALCIUM 8.2*  AST 129*  ALT 48*  ALKPHOS 209*  BILITOT 1.6*   ------------------------------------------------------------------------------------------------------------------  ------------------------------------------------------------------------------------------------------------------ GFR: Estimated Creatinine Clearance: 108.6 mL/min (by C-G formula based on SCr of 0.64 mg/dL). Liver Function Tests: Recent Labs  Lab 03/07/19 1312  AST 129*  ALT 48*  ALKPHOS 209*  BILITOT 1.6*  PROT 6.6  ALBUMIN 3.5   Recent Labs  Lab 03/07/19 1312  LIPASE 45   No results for input(s): AMMONIA in the last 168 hours. Coagulation Profile: Recent Labs  Lab 03/07/19 1312  INR 1.2   Cardiac Enzymes: No results for input(s): CKTOTAL, CKMB, CKMBINDEX, TROPONINI in the last 168 hours. BNP (last 3 results) No results for input(s): PROBNP in the last 8760 hours. HbA1C: No results for input(s): HGBA1C in the last 72 hours. CBG: No results for input(s): GLUCAP in the last 168 hours. Lipid Profile: No results for input(s): CHOL, HDL, LDLCALC, TRIG, CHOLHDL, LDLDIRECT in the last 72 hours. Thyroid Function Tests: No results for input(s): TSH, T4TOTAL, FREET4, T3FREE, THYROIDAB in the last 72 hours. Anemia Panel: No results for input(s): VITAMINB12, FOLATE, FERRITIN, TIBC, IRON, RETICCTPCT in the last 72 hours.  --------------------------------------------------------------------------------------------------------------- Urine analysis:    Component Value Date/Time   COLORURINE ORANGE (A) 03/07/2019 1630   APPEARANCEUR CLEAR 03/07/2019 1630   LABSPEC 1.020 03/07/2019 1630   PHURINE 5.5 03/07/2019 1630   GLUCOSEU 100 (A) 03/07/2019 1630   HGBUR NEGATIVE 03/07/2019  1630   BILIRUBINUR MODERATE (A) 03/07/2019 1630   KETONESUR 15 (A) 03/07/2019 1630   PROTEINUR 30 (A)  03/07/2019 1630   NITRITE POSITIVE (A) 03/07/2019 1630   LEUKOCYTESUR NEGATIVE 03/07/2019 1630      Imaging Results:    Ct Abdomen Pelvis W Contrast  Result Date: 03/07/2019 CLINICAL DATA:  Acute abdominal pain. EXAM: CT ABDOMEN AND PELVIS WITH CONTRAST TECHNIQUE: Multidetector CT imaging of the abdomen and pelvis was performed using the standard protocol following bolus administration of intravenous contrast. CONTRAST:  144m OMNIPAQUE IOHEXOL 300 MG/ML  SOLN COMPARISON:  None. FINDINGS: Lower chest: Normal. Hepatobiliary: Hepatomegaly with inhomogeneous hepatic steatosis. Biliary tree appears normal. Pancreas: Unremarkable. No pancreatic ductal dilatation or surrounding inflammatory changes. Spleen: Normal in size without focal abnormality. Adrenals/Urinary Tract: Adrenal glands are unremarkable. Kidneys are normal, without renal calculi, focal lesion, or hydronephrosis. Bladder is unremarkable. Stomach/Bowel: Stomach is within normal limits. Appendix has been removed. No evidence of bowel wall thickening, distention, or inflammatory changes. Vascular/Lymphatic: Aortic atherosclerosis. No enlarged abdominal or pelvic lymph nodes. Reproductive: Prostate is unremarkable. Other: Extensive ascites. No hernias. Musculoskeletal: No acute or significant osseous findings. IMPRESSION: 1. Hepatomegaly with inhomogeneous hepatic steatosis. 2. Extensive ascites. Aortic Atherosclerosis (ICD10-I70.0). Electronically Signed   By: JLorriane ShireM.D.   On: 03/07/2019 15:57   Dg Abd Portable 1 View  Result Date: 03/07/2019 CLINICAL DATA:  Abdominal pain EXAM: PORTABLE ABDOMEN - 1 VIEW COMPARISON:  None. FINDINGS: The bowel gas pattern is normal. No radio-opaque calculi or other significant radiographic abnormality are seen. IMPRESSION: Negative. Electronically Signed   By: KUlyses JarredM.D.   On: 03/07/2019 21:00    UKoreaAbdomen Limited Ruq  Result Date: 03/07/2019 CLINICAL DATA:  Abdominal pain for the past 2 months. EXAM: ULTRASOUND ABDOMEN LIMITED RIGHT UPPER QUADRANT COMPARISON:  Item ultrasound report dated 12/25/2010. FINDINGS: Gallbladder: Minimal diffuse wall thickening measuring 3.2 mm in maximum thickness. No gallstones or sonographic Murphy sign. There is a small amount of pericholecystic fluid. Common bile duct: Diameter: 6.1 mm Liver: Diffusely echogenic, mildly heterogeneous and possibly enlarged with minimal contour irregularity. No mass is seen. The main portal vein is somewhat small with a suggestion of partial thrombosis or thrombosis with cavernous transformation. Other: Moderate amount of ascites. IMPRESSION: 1. Possible changes of cirrhosis of the liver with and associated moderate amount of ascites and small amount of pericholecystic fluid. 2. Mild diffuse gallbladder wall thickening, most likely due to hypoproteinemia. 3. Possible partial thrombosis of the portal vein or complete thrombosis with cavernous transformation. Electronically Signed   By: SClaudie ReveringM.D.   On: 03/07/2019 14:52   nsr at 90, nl axis, nl int, no st-t changes c/w ischemia   Assessment & Plan:    Active Problems:   Ascites   Abdominal pain   Rectal bleeding   Abnormal liver function  Abdominal pain likely secondary to ascites, r/o SBP ddx gastritis, pud NPO Ultrasound guided paracentesis Rocephin 1gm iv x1 given in ED, await paracentesis results before giving further abx Dilaudid 0.'5mg'$  iv q4h prn   Rectal bleeding NPO protonix '40mg'$  iv bid due to black stool but this could be from peptobismol Check cbc in am GI (Dr. MCollene Mares consulted by ED, and states will be by in AM, appreciate input  ETOH use Pt states can quit ETOH, no hx of withdrawal CIWA  Abnormal liver function  Iron saturation 36% on 04/21/2017 Iron 93, TIBC 256  ? Pre-diabetes vs diabetes (hga1c=5.5) DC Metformin for now fsbs q4h,  iss  ? Psoriatic arthritis DC Consentx for now  Ascites Start on spironolactone '50mg'$  po qday Start on  lasix '20mg'$  po qday  NOTE patient is under a research study for Dr. Dossie Der (rheumatology)  ???   DVT Prophylaxis-    SCDs   AM Labs Ordered, also please review Full Orders  Family Communication: Admission, patients condition and plan of care including tests being ordered have been discussed with the patient  who indicate understanding and agree with the plan and Code Status.  Code Status:  FULL CODE,  Notified wife that patient admitted to [facility]  Admission status: Inpatient: Based on patients clinical presentation and evaluation of above clinical data, I have made determination that patient meets Inpatient criteria at this time.  Pt requires evaluation of rectal bleeding as well as abnormal liver function and ascites r/o SBP,  Pt will require iv abx while awaiting paracentesis results as well as close monitoring of potassium and renal function while starting spironolactone and lasix.   Pt has high risk of clinical deterioration.   Pt will require > 2 nites stay, and inpatient stay  Time spent in minutes : 70   Jani Gravel M.D on 03/08/2019 at 1:31 AM

## 2019-03-07 NOTE — ED Notes (Signed)
Patient transported to Ultrasound 

## 2019-03-07 NOTE — ED Notes (Signed)
Dr. Plunkett at bedside.  

## 2019-03-07 NOTE — ED Notes (Signed)
ED Provider at bedside. 

## 2019-03-07 NOTE — Progress Notes (Signed)
58 year old male with history of anxiety, HTN, arthritis, HLD and GERD presented to [facility] ED with abdominal bloating, heartburn, reflux, dark stool, nausea, vomiting and decreased appetite for months that has gotten worse recently.  Seen by PCP recently and referred to GI.  However, symptoms getting worse to wait for his GI appointment, and presented to ED.  In ED, HDS.  CBC significant for Hgb 12.4 with MCV of 105.  CMP significant for ALP 209, AST 129, ALT 48 and T bili of 1.6.  PT/INR within normal range.  UA with moderate bilirubin, 100 glucose, 15 ketones, nitrite and few bacteria.  FOBT negative.  EKG features normal sinus rhythm.  Abdominal ultrasound concerning for cirrhosis, moderate ascites,  small pericholecystic fluid, mild diffuse gallbladder wall thickening, possible partial thrombosis of portal vein/complete thrombosis with cavernous transformation.  CT abdomen/pelvis revealed hepatology with inhomogeneous hepatic steatosis and extensive ascites.  EDP discussed patient with GI, Dr. Collene Mares who recommended admission to [facility].  Received morphine, Zofran and 500 cc normal saline bolus in ED.  Accepted admission to telemetry bed.  Needs COVID-19 screening on arrival.  May consider IR consult on arrival.

## 2019-03-07 NOTE — ED Notes (Signed)
Pt a transfer from med center transfer  C/c abdominal pain x 2 months  Ct shows ascites  Hepatic stenosis  Portal vein thrombosis   Elevated ast/ alt  V/s on arrival 138/82, hr 93, rr16, spo2 96 room air,

## 2019-03-07 NOTE — ED Notes (Addendum)
Patient diaphoretic and writhing in pain; complains of upper abd pain; Mendel Ryder PA aware and in with patient to eval.

## 2019-03-07 NOTE — ED Notes (Signed)
Pt attempted to give urine sample without success.

## 2019-03-07 NOTE — ED Notes (Signed)
Spoke with [place] for hospitalist  For [employer]

## 2019-03-07 NOTE — ED Notes (Signed)
ED TO INPATIENT HANDOFF REPORT  ED Nurse Name and Phone #: jon wled   S Name/Age/Gender Andrew Patton 58 y.o. male Room/Bed: WA16/WA16  Code Status   Code Status: Not on file  Home/SNF/Other Home Patient oriented to: self, place, time and situation Is this baseline? Yes   Triage Complete: Triage complete  Chief Complaint Abdominal pain [R10.9]  Triage Note  C/o abd pain/CP-worse after eating-also c/o bloating, black stools and blood in urine intermitent x 2+ months-pt states he was seen by PCP with referral to GI-[facility]-steady gait   Allergies Allergies  Allergen Reactions  . Penicillins Rash    Has patient had a PCN reaction causing immediate rash, facial/tongue/throat swelling, SOB or lightheadedness with hypotension: No Has patient had a PCN reaction causing severe rash involving mucus membranes or skin necrosis: No Has patient had a PCN reaction that required hospitalization: No Has patient had a PCN reaction occurring within the last 10 years: No If all of the above answers are "NO", then may proceed with Cephalosporin use.    Level of Care/Admitting Diagnosis ED Disposition    ED Disposition Condition [facility] Area: [place] [[id number]]  Level of Care: Stepdown [14]  Admit to SDU based on following criteria: Hemodynamic compromise or significant risk of instability:  Patient requiring short term acute titration and management of vasoactive drips, and invasive monitoring (i.e., CVP and Arterial line).  Covid Evaluation: Asymptomatic Screening Protocol (No Symptoms)  Diagnosis: Abdominal pain [245809]  Admitting Physician: Jani Gravel [[id number]]  Attending Physician: Jani Gravel [phone]  Estimated length of stay: past midnight tomorrow  Certification:: I certify this patient will need inpatient services for at least 2 midnights  PT Class (Do Not Modify): Inpatient [101]  PT Acc Code (Do Not Modify): Private [1]        B Medical/Surgery History Past Medical History:  Diagnosis Date  . Allergy   . Anxiety   . Arthritis   . Hyperlipidemia   . Hypertension   . Pre-diabetes    Past Surgical History:  Procedure Laterality Date  . APPENDECTOMY    . NOSE SURGERY    . ROTATOR CUFF REPAIR     left     A IV Location/Drains/Wounds Patient Lines/Drains/Airways Status   Active Line/Drains/Airways    Name:   Placement date:   Placement time:   Site:   Days:   Peripheral IV 03/07/19 Left Antecubital   03/07/19    1309    Antecubital   less than 1          Intake/Output Last 24 hours  Intake/Output Summary (Last 24 hours) at 03/07/2019 2333 Last data filed at 03/07/2019 1522 Gross per 24 hour  Intake 500 ml  Output -  Net 500 ml    Labs/Imaging Results for orders placed or performed during the hospital encounter of 03/07/19 (from the past 48 hour(s))  Comprehensive metabolic panel     Status: Abnormal   Collection Time: 03/07/19  1:12 PM  Result Value Ref Range   Sodium 139 135 - 145 mmol/L   Potassium 3.9 3.5 - 5.1 mmol/L   Chloride 105 98 - 111 mmol/L   CO2 23 22 - 32 mmol/L   Glucose, Bld 107 (H) 70 - 99 mg/dL   BUN 6 6 - 20 mg/dL   Creatinine, Ser 0.64 0.61 - 1.24 mg/dL   Calcium 8.2 (L) 8.9 - 10.3 mg/dL   Total Protein 6.6 6.5 - 8.1  g/dL   Albumin 3.5 3.5 - 5.0 g/dL   AST 161129 (H) 15 - 41 U/L   ALT 48 (H) 0 - 44 U/L   Alkaline Phosphatase 209 (H) 38 - 126 U/L   Total Bilirubin 1.6 (H) 0.3 - 1.2 mg/dL   GFR calc non Af Amer >60 >60 mL/min   GFR calc Af Amer >60 >60 mL/min   Anion gap 11 5 - 15    Comment: Performed at [facility] [place], [address] [place], [place] [address]  CBC with Differential     Status: Abnormal   Collection Time: 03/07/19  1:12 PM  Result Value Ref Range   WBC 9.8 4.0 - 10.5 K/uL   RBC 3.55 (L) 4.22 - 5.81 MIL/uL   Hemoglobin 12.4 (L) 13.0 - 17.0 g/dL   HCT 54.037.2 (L) 98.139.0 - 19.152.0 %   MCV 104.8 (H) 80.0 - 100.0 fL   MCH 34.9 (H) 26.0 - 34.0  pg   MCHC 33.3 30.0 - 36.0 g/dL   RDW 47.814.3 29.511.5 - 62.115.5 %   Platelets 242 150 - 400 K/uL   nRBC 0.0 0.0 - 0.2 %   Neutrophils Relative % 68 %   Neutro Abs 6.7 1.7 - 7.7 K/uL   Lymphocytes Relative 18 %   Lymphs Abs 1.7 0.7 - 4.0 K/uL   Monocytes Relative 9 %   Monocytes Absolute 0.9 0.1 - 1.0 K/uL   Eosinophils Relative 4 %   Eosinophils Absolute 0.4 0.0 - 0.5 K/uL   Basophils Relative 1 %   Basophils Absolute 0.1 0.0 - 0.1 K/uL   Immature Granulocytes 0 %   Abs Immature Granulocytes 0.02 0.00 - 0.07 K/uL    Comment: Performed at [facility] [place], [address] [place], [place] [address]  Lipase, blood     Status: None   Collection Time: 03/07/19  1:12 PM  Result Value Ref Range   Lipase 45 11 - 51 U/L    Comment: Performed at [facility] [place], [address] [place], [place] [address]  Protime-INR     Status: None   Collection Time: 03/07/19  1:12 PM  Result Value Ref Range   Prothrombin Time 14.9 11.4 - 15.2 seconds   INR 1.2 0.8 - 1.2    Comment: (NOTE) INR goal varies based on device and disease states. Performed at [facility] [place], [address] [place], [place] [address]   Occult blood card to lab, stool Provider will collect     Status: None   Collection Time: 03/07/19  1:30 PM  Result Value Ref Range   Fecal Occult Bld NEGATIVE NEGATIVE    Comment: Performed at [facility] [place], [address] [place], [place] [address]  Urinalysis, Routine w reflex microscopic     Status: Abnormal   Collection Time: 03/07/19  4:30 PM  Result Value Ref Range   Color, Urine ORANGE (A) YELLOW    Comment: BIOCHEMICALS MAY BE AFFECTED BY COLOR   APPearance CLEAR CLEAR   Specific Gravity, Urine 1.020 1.005 - 1.030   pH 5.5 5.0 - 8.0   Glucose, UA 100 (A) NEGATIVE mg/dL   Hgb urine dipstick NEGATIVE NEGATIVE   Bilirubin Urine MODERATE (A) NEGATIVE   Ketones, ur 15 (A) NEGATIVE mg/dL   Protein, ur 30 (A) NEGATIVE mg/dL   Nitrite POSITIVE (A)  NEGATIVE   Leukocytes,Ua NEGATIVE NEGATIVE    Comment: Performed at [facility]  [place], [address] [place], [place] [address]  Urinalysis, Microscopic (reflex)     Status: Abnormal   Collection Time: 03/07/19  4:30 PM  Result Value Ref Range   RBC / HPF 0-5 0 - 5 RBC/hpf   WBC, UA 0-5 0 - 5 WBC/hpf   Bacteria, UA FEW (A) NONE SEEN   Squamous Epithelial / LPF 0-5 0 - 5   Mucus PRESENT     Comment: Performed at [facility] [place], [address] [place], [place] [address]  SARS Coronavirus 2 [facility](Hospital order, Performed in [facility] lab) Nasopharyngeal Nasopharyngeal Swab     Status: None   Collection Time: 03/07/19  5:28 PM   Specimen: Nasopharyngeal Swab  Result Value Ref Range   SARS Coronavirus 2 NEGATIVE NEGATIVE    Comment: (NOTE) If result is NEGATIVE SARS-CoV-2 target nucleic acids are NOT DETECTED. The SARS-CoV-2 RNA is generally detectable in upper and lower  respiratory specimens during the acute phase of infection. The lowest  concentration of SARS-CoV-2 viral copies this assay can detect is 250  copies / mL. A negative result does not preclude SARS-CoV-2 infection  and should not be used as the sole basis for treatment or other  patient management decisions.  A negative result may occur with  improper specimen collection / handling, submission of specimen other  than nasopharyngeal swab, presence of viral mutation(s) within the  areas targeted by this assay, and inadequate number of viral copies  (<250 copies / mL). A negative result must be combined with clinical  observations, patient history, and epidemiological information. If result is POSITIVE SARS-CoV-2 target nucleic acids are DETECTED. The SARS-CoV-2 RNA is generally detectable in upper and lower  respiratory specimens dur ing the acute phase of infection.  Positive  results are indicative of active infection with SARS-CoV-2.  Clinical  correlation with patient history and other  diagnostic information is  necessary to determine patient infection status.  Positive results do  not rule out bacterial infection or co-infection with other viruses. If result is PRESUMPTIVE POSTIVE SARS-CoV-2 nucleic acids MAY BE PRESENT.   A presumptive positive result was obtained on the submitted specimen  and confirmed on repeat testing.  While 2019 novel coronavirus  (SARS-CoV-2) nucleic acids may be present in the submitted sample  additional confirmatory testing may be necessary for epidemiological  and / or clinical management purposes  to differentiate between  SARS-CoV-2 and other Sarbecovirus currently known to infect humans.  If clinically indicated additional testing with an alternate test  methodology [phone]([id number]) is advised. The SARS-CoV-2 RNA is generally  detectable in upper and lower respiratory sp ecimens during the acute  phase of infection. The expected result is Negative. Fact Sheet for Patients:  BoilerBrush.com.cyhttps://www.fda.gov/media/136312/download Fact Sheet for Healthcare Providers: https://pope.com/https://www.fda.gov/media/136313/download This test is not yet approved or cleared by the [place] FDA and has been authorized for detection and/or diagnosis of SARS-CoV-2 by FDA under an Emergency Use Authorization (EUA).  This EUA will remain in effect (meaning this test can be used) for the duration of the COVID-19 declaration under Section 564(b)(1) of the Act, 21 U.S.C. section 360bbb-3(b)(1), unless the authorization is terminated or revoked sooner. Performed at [facility] [place], [address] [place], [place] [address]    Ct Abdomen Pelvis W Contrast  Result Date: 03/07/2019 CLINICAL DATA:  Acute abdominal pain. EXAM: CT ABDOMEN AND PELVIS WITH CONTRAST TECHNIQUE: Multidetector CT imaging of the abdomen and pelvis was performed  using the standard protocol following bolus administration of intravenous contrast. CONTRAST:  OMNIPAQUE IOHEXOL 300 MG/ML  SOLN COMPARISON:   None. FINDINGS: Lower chest: Normal. Hepatobiliary: Hepatomegaly with inhomogeneous hepatic steatosis. Biliary tree appears normal. Pancreas: Unremarkable. No pancreatic ductal dilatation or surrounding inflammatory changes. Spleen: Normal in size without focal abnormality. Adrenals/Urinary Tract: Adrenal glands are unremarkable. Kidneys are normal, without renal calculi, focal lesion, or hydronephrosis. Bladder is unremarkable. Stomach/Bowel: Stomach is within normal limits. Appendix has been removed. No evidence of bowel wall thickening, distention, or inflammatory changes. Vascular/Lymphatic: Aortic atherosclerosis. No enlarged abdominal or pelvic lymph nodes. Reproductive: Prostate is unremarkable. Other: Extensive ascites. No hernias. Musculoskeletal: No acute or significant osseous findings. IMPRESSION: 1. Hepatomegaly with inhomogeneous hepatic steatosis. 2. Extensive ascites. Aortic Atherosclerosis (ICD10-I70.0). Electronically Signed   By: Francene Boyers M.D.   On: 03/07/2019 15:57   Dg Abd Portable 1 View  Result Date: 03/07/2019 CLINICAL DATA:  Abdominal pain EXAM: PORTABLE ABDOMEN - 1 VIEW COMPARISON:  None. FINDINGS: The bowel gas pattern is normal. No radio-opaque calculi or other significant radiographic abnormality are seen. IMPRESSION: Negative. Electronically Signed   By: Deatra Robinson M.D.   On: 03/07/2019 21:00   US Abdomen Limited Ruq  Result Date: 03/07/2019 CLINICAL DATA:  Abdominal pain for the past 2 months. EXAM: ULTRASOUND ABDOMEN LIMITED RIGHT UPPER QUADRANT COMPARISON:  Item ultrasound report dated 12/25/2010. FINDINGS: Gallbladder: Minimal diffuse wall thickening measuring 3.2 mm in maximum thickness. No gallstones or sonographic Murphy sign. There is a small amount of pericholecystic fluid. Common bile duct: Diameter: 6.1 mm Liver: Diffusely echogenic, mildly heterogeneous and possibly enlarged with minimal contour irregularity. No mass is seen. The main portal vein is somewhat  small with a suggestion of partial thrombosis or thrombosis with cavernous transformation. Other: Moderate amount of ascites. IMPRESSION: 1. Possible changes of cirrhosis of the liver with and associated moderate amount of ascites and small amount of pericholecystic fluid. 2. Mild diffuse gallbladder wall thickening, most likely due to hypoproteinemia. 3. Possible partial thrombosis of the portal vein or complete thrombosis with cavernous transformation. Electronically Signed   By: Beckie Salts M.D.   On: 03/07/2019 14:52    Pending Labs Unresulted Labs (From admission, onward)   None      Vitals/Pain Today's Vitals   03/07/19 2115 03/07/19 2117 03/07/19 2137 03/07/19 2137  BP: 136/86     Pulse: 91     Resp: 20     Temp:      TempSrc:      SpO2: 97%     Weight:      Height:      PainSc:  8  7  5      Isolation Precautions No active isolations  Medications Medications  morphine 4 MG/ML injection 4 mg (4 mg Intravenous Not Given 03/07/19 1754)  cefTRIAXone (ROCEPHIN) 1 g in sodium chloride 0.9 % 100 mL IVPB (0 g Intravenous Stopped 03/07/19 2206)  0.9 %  sodium chloride infusion ( Intravenous Transfusing/Transfer 03/07/19 2137)  ondansetron (ZOFRAN) injection 4 mg (4 mg Intravenous Given 03/07/19 1400)  sodium chloride 0.9 % bolus 500 mL (0 mLs Intravenous Stopped 03/07/19 1522)  morphine 4 MG/ML injection 4 mg (4 mg Intravenous Given 03/07/19 1400)  iohexol (OMNIPAQUE) 300 MG/ML solution 100 mL (100 mLs Intravenous Contrast Given 03/07/19 1533)  morphine 4 MG/ML injection 4 mg (4 mg Intravenous Given 03/07/19 1739)  fentaNYL (SUBLIMAZE) injection 100 mcg (100 mcg Intravenous Given 03/07/19 1945)  ondansetron (ZOFRAN) injection 4 mg (  4 mg Intravenous Given 03/07/19 1941)  morphine 2 MG/ML injection 2 mg (2 mg Intravenous Given 03/07/19 2016)  morphine 2 MG/ML injection 2 mg (2 mg Intravenous Given 03/07/19 2031)  HYDROmorphone (DILAUDID) injection 1 mg (1 mg Intravenous Given 03/07/19 2117)   HYDROmorphone (DILAUDID) injection 1 mg (1 mg Intravenous Given 03/07/19 2232)    Mobility walks Low fall risk   Focused Assessment    R Recommendations: See Admitting Provider Note  Report given to:   Additional Notes:

## 2019-03-07 NOTE — ED Provider Notes (Signed)
[facility] EMERGENCY DEPARTMENT Provider Note   CSN: [id number] Arrival date & time: 03/07/19  1252    History   Chief Complaint Chief Complaint  Patient presents with   Abdominal Pain    HPI Andrew Patton is a 58 y.o. male past medical history of anxiety, arthritis, hyperlipidemia, hypertension who presents for evaluation of abdominal bloating, chest pressure/burning, black stools, nausea/vomiting, and decreased appetite that has been ongoing for the last several months.  He states that in January 2020, he first noticed that he would start having this burning sensation in his chest after he ate followed by an occasional feeling like he was vomiting up acid or what he just ate.  He states that this continued and caused him not to when to eat.  Over the last several weeks, this chest burning sensation has been constant in nature.  It is not worse with exertion or deep inspiration.  He states that whenever he ate, he would get nauseous and feel like he was going to vomit.  He states that over the course of the last 2 months, this pain has become more constant and more severe in nature.  Additionally, about 3-4 months ago, he started noticing some abdominal distention.  He states initially it started out feeling like he was bloated and was having a lot of flatulence but states that it never got better and he continued to have abdominal bloating.  He estimates that he is gained about 5 pounds since then.  He states that he feels like this is causing him to have difficulty breathing.  He he also reports losing his appetite over the course of the symptoms.  He states he has not wanted to eat much and when he does, it makes him vomit a few hours afterwards.  He also started experiencing black greasy stools a few weeks ago.  He states that he was having diarrhea and estimates going between 6 and 10 episodes a day.  He reports all of them did not contain blood but they were very black and greasy.   He then started having some bright red blood in his stools.  He states that that has since improved.  Additionally, his wife notes over the last week or so, his urine has had intermittent episodes of being pink or darker than normal.  He has not any dysuria.  They thought this might be related to Cosentyx which he started about 5 weeks ago but he endorses chest pain and abdominal bloating that began before then.  He has since stopped taking the Cosentyx.  He called his primary care doctor and he was referred to a GI doctor and has appointment with them in the next 3 weeks but felt like symptoms were becoming more severe and were inhibiting his quality of life, prompting ED visit.  He has not measured any fever but he has noted some subjective fever chills over the last several weeks.  He has not had any night sweats or weight loss.  He reports no family history of colon cancer.  His last colonoscopy was 7 years ago and was normal at that time.  He has no history of diabetes or hypertension.  He estimates drinking between 3 and 4 bottles of wine every week.  Denies any smoking history.     The history is provided by the patient.    Past Medical History:  Diagnosis Date   Allergy    Anxiety    Arthritis  Hyperlipidemia    Hypertension    Pre-diabetes     Patient Active Problem List   Diagnosis Date Noted   Portal vein thrombosis 03/07/2019   Ascites 03/07/2019   Unspecified essential hypertension 04/29/2014   Other and unspecified hyperlipidemia 04/29/2014    Past Surgical History:  Procedure Laterality Date   APPENDECTOMY     NOSE SURGERY     ROTATOR CUFF REPAIR     left        Home Medications    Prior to Admission medications   Medication Sig Start Date End Date Taking? Authorizing Provider  busPIRone (BUSPAR) 15 MG tablet Take 15 mg by mouth 3 (three) times daily.   Yes [provider]  clonazePAM (KLONOPIN) 0.5 MG tablet Take 0.5 mg by mouth 2  (two) times daily as needed for anxiety.   Yes [provider]  ezetimibe (ZETIA) 10 MG tablet Take 10 mg by mouth daily.   Yes [provider]  levocetirizine (XYZAL) 5 MG tablet Take 5 mg by mouth every evening.   Yes [provider]  metFORMIN (GLUCOPHAGE) 500 MG tablet Take 500 mg by mouth every evening.   Yes [provider]  olmesartan (BENICAR) 40 MG tablet Take 20 mg by mouth daily.    Yes [provider]  pantoprazole (PROTONIX) 40 MG tablet Take 40 mg by mouth daily.   Yes [provider]  zolpidem (AMBIEN) 10 MG tablet Take 5 mg by mouth at bedtime as needed for sleep.   Yes [provider]  aspirin 81 MG tablet Take 81 mg by mouth daily.    [provider]  Cholecalciferol (VITAMIN D3) 1000 UNITS CAPS Take 1 capsule by mouth daily.     [provider]  colesevelam (WELCHOL) 625 MG tablet Take 1,250 mg by mouth daily.    [provider]  diphenhydrAMINE (BENADRYL) 25 MG tablet Take 25 mg by mouth at bedtime as needed for allergies.    [provider]  doxycycline (VIBRAMYCIN) 100 MG capsule Take 1 capsule (100 mg total) by mouth 2 (two) times daily. Patient not taking: Reported on 04/19/2017 04/06/17   Pisciotta, Elmyra Ricks, PA-C  fluticasone [facility]) 50 MCG/ACT nasal spray Place 1 spray into both nostrils daily.  01/05/13   [provider]  NON FORMULARY Take 1 tablet by mouth daily. Mega Red 1 tablet daily     [provider]    Family History Family History  Problem Relation Age of Onset   Breast cancer Mother    Hyperlipidemia Mother    Bladder Cancer Father    Birth defects Maternal Grandfather    Birth defects Paternal Grandmother    Colon cancer Neg Hx    Esophageal cancer Neg Hx    Rectal cancer Neg Hx    Stomach cancer Neg Hx     Social History Social History   Tobacco Use   Smoking status: Former Smoker   Smokeless tobacco: Never Used    Substance Use Topics   Alcohol use: Yes    Comment: daily   Drug use: No     Allergies   Penicillins   Review of Systems Review of Systems  Constitutional: Positive for appetite change and unexpected weight change (weight gain). Negative for fever.  Respiratory: Positive for shortness of breath. Negative for cough.   Cardiovascular: Positive for chest pain.  Gastrointestinal: Positive for abdominal distention, abdominal pain, blood in stool, diarrhea, nausea and vomiting.  Genitourinary: Positive for hematuria.  Negative for dysuria.  Neurological: Negative for headaches.  All other systems reviewed and are negative.    Physical Exam Updated Vital Signs BP 136/86    Pulse 91    Temp 99 F (37.2 C) (Oral)    Resp 20    Ht '5\' 6"'$  (1.676 m)    Wt 95.3 kg    SpO2 97%    BMI 33.89 kg/m   Physical Exam Vitals signs and nursing note reviewed. Exam conducted with a chaperone present.  Constitutional:      Appearance: Normal appearance. He is well-developed.     Comments: Appears uncomfortable  HENT:     Head: Normocephalic and atraumatic.  Eyes:     General: Lids are normal.     Conjunctiva/sclera: Conjunctivae normal.     Pupils: Pupils are equal, round, and reactive to light.  Neck:     Musculoskeletal: Full passive range of motion without pain.  Cardiovascular:     Rate and Rhythm: Normal rate and regular rhythm.     Pulses: Normal pulses.     Heart sounds: Normal heart sounds. No murmur. No friction rub. No gallop.   Pulmonary:     Effort: Pulmonary effort is normal.     Breath sounds: Normal breath sounds.     Comments: Lungs clear to auscultation bilaterally.  Symmetric chest rise.  No wheezing, rales, rhonchi. Abdominal:     General: Bowel sounds are decreased. There is distension.     Palpations: Abdomen is soft. Abdomen is not rigid.     Tenderness: There is generalized abdominal tenderness. There is no guarding.     Comments: Abdomen distended notably.   Diffuse tenderness noted throughout.  Hypoactive bowel sounds.  Genitourinary:    Comments: The exam was performed with a chaperone present. Digital Rectal Exam reveals sphincter with good tone. No external hemorrhoids. No masses or fissures. Stool color is brown with no overt blood. Musculoskeletal: Normal range of motion.  Skin:    General: Skin is warm and dry.     Capillary Refill: Capillary refill takes less than 2 seconds.  Neurological:     Mental Status: He is alert and oriented to person, place, and time.  Psychiatric:        Speech: Speech normal.      ED Treatments / Results  Labs (all labs ordered are listed, but only abnormal results are displayed) Labs Reviewed  COMPREHENSIVE METABOLIC PANEL - Abnormal; Notable for the following components:      Result Value   Glucose, Bld 107 (*)    Calcium 8.2 (*)    AST 129 (*)    ALT 48 (*)    Alkaline Phosphatase 209 (*)    Total Bilirubin 1.6 (*)    All other components within normal limits  CBC WITH DIFFERENTIAL/PLATELET - Abnormal; Notable for the following components:   RBC 3.55 (*)    Hemoglobin 12.4 (*)    HCT 37.2 (*)    MCV 104.8 (*)    MCH 34.9 (*)    All other components within normal limits  URINALYSIS, ROUTINE W REFLEX MICROSCOPIC - Abnormal; Notable for the following components:   Color, Urine ORANGE (*)    Glucose, UA 100 (*)    Bilirubin Urine MODERATE (*)    Ketones, ur 15 (*)    Protein, ur 30 (*)    Nitrite POSITIVE (*)    All other components within normal limits  URINALYSIS, MICROSCOPIC (REFLEX) - Abnormal; Notable for the following  components:   Bacteria, UA FEW (*)    All other components within normal limits  SARS CORONAVIRUS 2 (HOSPITAL ORDER, [facility])  LIPASE, BLOOD  OCCULT BLOOD X 1 CARD TO LAB, STOOL  PROTIME-INR    EKG EKG Interpretation  Date/Time:  Wednesday March 07 2019 13:03:59 EDT Ventricular Rate:  90 PR Interval:    QRS Duration: 94 QT  Interval:  354 QTC Calculation: 434 R Axis:   64 Text Interpretation:  Sinus rhythm Baseline wander in lead(s) V4 No significant change since last tracing Confirmed by Gareth Morgan [phone]) on 03/07/2019 2:57:48 PM   Radiology Ct Abdomen Pelvis W Contrast  Result Date: 03/07/2019 CLINICAL DATA:  Acute abdominal pain. EXAM: CT ABDOMEN AND PELVIS WITH CONTRAST TECHNIQUE: Multidetector CT imaging of the abdomen and pelvis was performed using the standard protocol following bolus administration of intravenous contrast. CONTRAST:  135m OMNIPAQUE IOHEXOL 300 MG/ML  SOLN COMPARISON:  None. FINDINGS: Lower chest: Normal. Hepatobiliary: Hepatomegaly with inhomogeneous hepatic steatosis. Biliary tree appears normal. Pancreas: Unremarkable. No pancreatic ductal dilatation or surrounding inflammatory changes. Spleen: Normal in size without focal abnormality. Adrenals/Urinary Tract: Adrenal glands are unremarkable. Kidneys are normal, without renal calculi, focal lesion, or hydronephrosis. Bladder is unremarkable. Stomach/Bowel: Stomach is within normal limits. Appendix has been removed. No evidence of bowel wall thickening, distention, or inflammatory changes. Vascular/Lymphatic: Aortic atherosclerosis. No enlarged abdominal or pelvic lymph nodes. Reproductive: Prostate is unremarkable. Other: Extensive ascites. No hernias. Musculoskeletal: No acute or significant osseous findings. IMPRESSION: 1. Hepatomegaly with inhomogeneous hepatic steatosis. 2. Extensive ascites. Aortic Atherosclerosis (ICD10-I70.0). Electronically Signed   By: JLorriane ShireM.D.   On: 03/07/2019 15:57   Dg Abd Portable 1 View  Result Date: 03/07/2019 CLINICAL DATA:  Abdominal pain EXAM: PORTABLE ABDOMEN - 1 VIEW COMPARISON:  None. FINDINGS: The bowel gas pattern is normal. No radio-opaque calculi or other significant radiographic abnormality are seen. IMPRESSION: Negative. Electronically Signed   By: KUlyses JarredM.D.   On: 03/07/2019 21:00    [place]domen Limited Ruq  Result Date: 03/07/2019 CLINICAL DATA:  Abdominal pain for the past 2 months. EXAM: ULTRASOUND ABDOMEN LIMITED RIGHT UPPER QUADRANT COMPARISON:  Item ultrasound report dated 12/25/2010. FINDINGS: Gallbladder: Minimal diffuse wall thickening measuring 3.2 mm in maximum thickness. No gallstones or sonographic Murphy sign. There is a small amount of pericholecystic fluid. Common bile duct: Diameter: 6.1 mm Liver: Diffusely echogenic, mildly heterogeneous and possibly enlarged with minimal contour irregularity. No mass is seen. The main portal vein is somewhat small with a suggestion of partial thrombosis or thrombosis with cavernous transformation. Other: Moderate amount of ascites. IMPRESSION: 1. Possible changes of cirrhosis of the liver with and associated moderate amount of ascites and small amount of pericholecystic fluid. 2. Mild diffuse gallbladder wall thickening, most likely due to hypoproteinemia. 3. Possible partial thrombosis of the portal vein or complete thrombosis with cavernous transformation. Electronically Signed   By: SClaudie ReveringM.D.   On: 03/07/2019 14:52    Procedures Procedures (including critical care time)  Medications Ordered in ED Medications  morphine 4 MG/ML injection 4 mg (4 mg Intravenous Not Given 03/07/19 1754)  cefTRIAXone (ROCEPHIN) 1 g in sodium chloride 0.9 % 100 mL IVPB (1 g Intravenous New Bag/Given 03/07/19 2126)  0.9 %  sodium chloride infusion (1,000 mLs Intravenous New Bag/Given 03/07/19 2125)  ondansetron (ZOFRAN) injection 4 mg (4 mg Intravenous Given 03/07/19 1400)  sodium chloride 0.9 % bolus 500 mL (0 mLs Intravenous Stopped  03/07/19 1522)  morphine 4 MG/ML injection 4 mg (4 mg Intravenous Given 03/07/19 1400)  iohexol (OMNIPAQUE) 300 MG/ML solution 100 mL (100 mLs Intravenous Contrast Given 03/07/19 1533)  morphine 4 MG/ML injection 4 mg (4 mg Intravenous Given 03/07/19 1739)  fentaNYL (SUBLIMAZE) injection 100 mcg (100 mcg Intravenous Given  03/07/19 1945)  ondansetron (ZOFRAN) injection 4 mg (4 mg Intravenous Given 03/07/19 1941)  morphine 2 MG/ML injection 2 mg (2 mg Intravenous Given 03/07/19 2016)  morphine 2 MG/ML injection 2 mg (2 mg Intravenous Given 03/07/19 2031)  HYDROmorphone (DILAUDID) injection 1 mg (1 mg Intravenous Given 03/07/19 2117)     Initial Impression / Assessment and Plan / ED Course  I have reviewed the triage vital signs and the nursing notes.  Pertinent labs & imaging results that were available during my care of the patient were reviewed by me and considered in my medical decision making (see chart for details).        58 year old male who presents for several months of burning sensation chest, nausea/vomiting, decreased appetite, blood in stool, abdominal distention and pain.  This was referred to GI by his PCP but is not been able to seen them yet.  Comes in today because symptoms are worse.  His chest burning sensation has been constant for the last several months.  Doubt ACS etiology.  Sounds more like GERD.  Additionally, consider cirrhosis versus infectious process versus ascites.  I will plan to check labs.  Fecal occult negative.  Lipase is normal.  CMP shows normal BUN and creatinine.  AST is 129, ALT of 48, alk phos of 209, total bili of 1.6.  Review of his previous records show that this is a new elevation for him.  CBC shows no leukocytosis.  Hemoglobin is 12.4, hematocrit is 37.2.  Given elevations in liver functions, will plan for right upper quadrant ultrasound.  PT/INR is 1.2.  Ultrasound shows possible changes of cirrhosis of the liver with associated moderate amount of ascites.  He also has mild diffuse gallbladder wall thickening.  No evidence of gallstones.  Possible partial thrombosis of the portal vein or complete thrombosis with cavernous transformation.  Discussed with Dr. Collene Mares (GI).  Recommends admission and will plan to consult.  Discussed with hospitalist.  Will accept patient for  admission.  8:00: RN inform me that patient was having significantly more pain.  He had gotten fentanyl about 30 minutes prior and was reporting that this was not helping.  She inform me that patient was diaphoretic.  I went and evaluated patient and he indeed did appear and to be in more pain and diaphoretic.  Repeat abdominal exam showed worsening generalized tenderness as well as some worsening distention.  He was immediately given more pain medication.  Vitals were stable at the time.  Equal pulses in all 4 extremities.  Given concerns about worsening abdominal pain, portable x-ray was ordered for evaluation of any signs of perforation, obstruction.  Abdominal x-ray reviewed.  No evidence of perforation or obstruction.  Discussed with Dr. Maudie Mercury (hospitalist).  There are currently no stepdown beds available.  Given patient's acuity of condition, do not feel that staying here at [facility] is the best option for him.  We will plan to do ED ED transfer.  Reevaluation.  He appears much more comfortable after pain medication.  He is no longer diaphoretic.  Repeat EKG is reassuring.  Will plan for additional analgesics, antibiotic coverage for SBP.  Discussed with Dr.  Haviland who agrees to accept patient for ED to ED transfer.  Portions of this note were generated with [employer]. Dictation errors may occur despite best attempts at proofreading.   Final Clinical Impressions(s) / ED Diagnoses   Final diagnoses:  Abdominal pain  Other ascites  Portal vein thrombosis  Transaminitis    ED Discharge Orders    None       Volanda Napoleon, PA-C 03/07/19 2359    Blanchie Dessert, MD 03/09/19 1305

## 2019-03-07 NOTE — ED Triage Notes (Signed)
C/o abd pain/CP-worse after eating-also c/o bloating, black stools and blood in urine intermitent x 2+ months-pt states he was seen by PCP with referral to GI-NAD-steady gait

## 2019-03-08 ENCOUNTER — Inpatient Hospital Stay (HOSPITAL_COMMUNITY): Payer: BC Managed Care – PPO

## 2019-03-08 ENCOUNTER — Encounter (HOSPITAL_COMMUNITY): Payer: Self-pay | Admitting: Internal Medicine

## 2019-03-08 DIAGNOSIS — K7031 Alcoholic cirrhosis of liver with ascites: Principal | ICD-10-CM

## 2019-03-08 DIAGNOSIS — K625 Hemorrhage of anus and rectum: Secondary | ICD-10-CM | POA: Diagnosis present

## 2019-03-08 DIAGNOSIS — R748 Abnormal levels of other serum enzymes: Secondary | ICD-10-CM

## 2019-03-08 DIAGNOSIS — R8290 Unspecified abnormal findings in urine: Secondary | ICD-10-CM

## 2019-03-08 DIAGNOSIS — I10 Essential (primary) hypertension: Secondary | ICD-10-CM

## 2019-03-08 DIAGNOSIS — R945 Abnormal results of liver function studies: Secondary | ICD-10-CM | POA: Diagnosis present

## 2019-03-08 DIAGNOSIS — E1159 Type 2 diabetes mellitus with other circulatory complications: Secondary | ICD-10-CM

## 2019-03-08 LAB — COMPREHENSIVE METABOLIC PANEL
ALT: 53 U/L — ABNORMAL HIGH (ref 0–44)
ALT: 50 U/L — ABNORMAL HIGH (ref 0–44)
AST: 136 U/L — ABNORMAL HIGH (ref 15–41)
AST: 130 U/L — ABNORMAL HIGH (ref 15–41)
Albumin: 3.9 g/dL (ref 3.5–5.0)
Albumin: 3.8 g/dL (ref 3.5–5.0)
Alkaline Phosphatase: 224 U/L — ABNORMAL HIGH (ref 38–126)
Alkaline Phosphatase: 219 U/L — ABNORMAL HIGH (ref 38–126)
Anion gap: 13 (ref 5–15)
Anion gap: 11 (ref 5–15)
BUN: 9 mg/dL (ref 6–20)
BUN: 10 mg/dL (ref 6–20)
CO2: 22 mmol/L (ref 22–32)
CO2: 25 mmol/L (ref 22–32)
Calcium: 7.9 mg/dL — ABNORMAL LOW (ref 8.9–10.3)
Calcium: 7.8 mg/dL — ABNORMAL LOW (ref 8.9–10.3)
Chloride: 102 mmol/L (ref 98–111)
Chloride: 101 mmol/L (ref 98–111)
Creatinine, Ser: 0.94 mg/dL (ref 0.61–1.24)
Creatinine, Ser: 1.04 mg/dL (ref 0.61–1.24)
GFR calc Af Amer: >60 mL/min (ref >60)
GFR calc Af Amer: >60 mL/min (ref >60)
GFR calc non Af Amer: >60 mL/min (ref >60)
GFR calc non Af Amer: >60 mL/min (ref >60)
Glucose, Bld: 116 mg/dL — ABNORMAL HIGH (ref 70–99)
Glucose, Bld: 116 mg/dL — ABNORMAL HIGH (ref 70–99)
Potassium: 4.8 mmol/L (ref 3.5–5.1)
Potassium: 4.4 mmol/L (ref 3.5–5.1)
Sodium: 137 mmol/L (ref 135–145)
Sodium: 137 mmol/L (ref 135–145)
Total Bilirubin: 2.1 mg/dL — ABNORMAL HIGH (ref 0.3–1.2)
Total Bilirubin: 2.1 mg/dL — ABNORMAL HIGH (ref 0.3–1.2)
Total Protein: 7.2 g/dL (ref 6.5–8.1)
Total Protein: 7.2 g/dL (ref 6.5–8.1)

## 2019-03-08 LAB — ALBUMIN, PLEURAL OR PERITONEAL FLUID: Albumin, Fluid: 1.9 g/dL

## 2019-03-08 LAB — BODY FLUID CELL COUNT WITH DIFFERENTIAL
Lymphs, Fluid: 26 %
Monocyte-Macrophage-Serous Fluid: 69 % (ref 50–90)
Neutrophil Count, Fluid: 5 % (ref 0–25)
Total Nucleated Cell Count, Fluid: 254 cu mm (ref 0–1000)

## 2019-03-08 LAB — CBC
HCT: 38.4 % — ABNORMAL LOW (ref 39.0–52.0)
Hemoglobin: 12.7 g/dL — ABNORMAL LOW (ref 13.0–17.0)
MCH: 36.4 pg — ABNORMAL HIGH (ref 26.0–34.0)
MCHC: 33.1 g/dL (ref 30.0–36.0)
MCV: 110 fL — ABNORMAL HIGH (ref 80.0–100.0)
Platelets: 258 10*3/uL (ref 150–400)
RBC: 3.49 MIL/uL — ABNORMAL LOW (ref 4.22–5.81)
RDW: 15.1 % (ref 11.5–15.5)
WBC: 11.7 10*3/uL — ABNORMAL HIGH (ref 4.0–10.5)
nRBC: 0 % (ref 0.0–0.2)

## 2019-03-08 LAB — GLUCOSE, CAPILLARY
Glucose-Capillary: 108 mg/dL — ABNORMAL HIGH (ref 70–99)
Glucose-Capillary: 100 mg/dL — ABNORMAL HIGH (ref 70–99)
Glucose-Capillary: 117 mg/dL — ABNORMAL HIGH (ref 70–99)
Glucose-Capillary: 112 mg/dL — ABNORMAL HIGH (ref 70–99)
Glucose-Capillary: 139 mg/dL — ABNORMAL HIGH (ref 70–99)
Glucose-Capillary: 128 mg/dL — ABNORMAL HIGH (ref 70–99)
Glucose-Capillary: 114 mg/dL — ABNORMAL HIGH (ref 70–99)

## 2019-03-08 LAB — MRSA PCR SCREENING: MRSA by PCR: NEGATIVE

## 2019-03-08 LAB — PROTEIN, PLEURAL OR PERITONEAL FLUID: Total protein, fluid: 3 g/dL

## 2019-03-08 LAB — AMMONIA: Ammonia: 69 umol/L — ABNORMAL HIGH (ref 9–35)

## 2019-03-08 MED ORDER — CLONAZEPAM 0.5 MG PO TABS
0.5000 mg | ORAL_TABLET | Freq: Every day | ORAL | Status: DC | PRN
  Filled 2019-03-08: qty 1

## 2019-03-08 MED ORDER — AZELASTINE HCL 0.1 % NA SOLN
1.0000 | Freq: Two times a day (BID) | NASAL | Status: DC
  Filled 2019-03-08: qty 30

## 2019-03-08 MED ORDER — BUSPIRONE HCL 5 MG PO TABS
15.0000 mg | ORAL_TABLET | Freq: Two times a day (BID) | ORAL | Status: DC
  Administered 2019-03-08 – 2019-03-09 (×4): 15 mg via ORAL
  Filled 2019-03-08 (×4): qty 1

## 2019-03-08 MED ORDER — HYDROMORPHONE HCL 1 MG/ML IJ SOLN
0.5000 mg | Freq: Once | INTRAMUSCULAR | Status: AC
  Administered 2019-03-08: 0.5 mg via INTRAVENOUS
  Filled 2019-03-08: qty 1

## 2019-03-08 MED ORDER — ONDANSETRON HCL 4 MG/2ML IJ SOLN: 4.0000 mg | Freq: Four times a day (QID) | INTRAMUSCULAR | Status: DC | PRN

## 2019-03-08 MED ORDER — CETIRIZINE HCL 10 MG PO TABS
10.0000 mg | ORAL_TABLET | Freq: Every evening | ORAL | Status: DC
  Administered 2019-03-08: 10 mg via ORAL
  Filled 2019-03-08 (×2): qty 1

## 2019-03-08 MED ORDER — VITAMIN B-1 100 MG PO TABS
100.0000 mg | ORAL_TABLET | Freq: Every day | ORAL | Status: DC
  Administered 2019-03-08 – 2019-03-09 (×2): 100 mg via ORAL
  Filled 2019-03-08 (×2): qty 1

## 2019-03-08 MED ORDER — SPIRONOLACTONE 25 MG PO TABS
50.0000 mg | ORAL_TABLET | Freq: Every day | ORAL | Status: DC
  Administered 2019-03-08 – 2019-03-09 (×2): 50 mg via ORAL
  Filled 2019-03-08 (×2): qty 2

## 2019-03-08 MED ORDER — LORAZEPAM 2 MG/ML IJ SOLN: 0.0000 mg | Freq: Two times a day (BID) | INTRAMUSCULAR | Status: DC

## 2019-03-08 MED ORDER — LORAZEPAM 1 MG PO TABS: 1.0000 mg | ORAL_TABLET | Freq: Four times a day (QID) | ORAL | Status: DC | PRN

## 2019-03-08 MED ORDER — FUROSEMIDE 20 MG PO TABS
20.0000 mg | ORAL_TABLET | Freq: Every day | ORAL | Status: DC
  Administered 2019-03-08 – 2019-03-09 (×2): 20 mg via ORAL
  Filled 2019-03-08 (×2): qty 1

## 2019-03-08 MED ORDER — INSULIN ASPART 100 UNIT/ML ~~LOC~~ SOLN: 0.0000 [IU] | SUBCUTANEOUS | Status: DC

## 2019-03-08 MED ORDER — SIMETHICONE 80 MG PO CHEW
160.0000 mg | CHEWABLE_TABLET | Freq: Three times a day (TID) | ORAL | Status: AC
  Administered 2019-03-08 – 2019-03-09 (×3): 160 mg via ORAL
  Filled 2019-03-08 (×3): qty 2

## 2019-03-08 MED ORDER — FLUTICASONE PROPIONATE 50 MCG/ACT NA SUSP: 1.0000 | Freq: Every day | NASAL | Status: DC | PRN

## 2019-03-08 MED ORDER — ADULT MULTIVITAMIN W/MINERALS CH
1.0000 | ORAL_TABLET | Freq: Every day | ORAL | Status: DC
  Administered 2019-03-08 – 2019-03-09 (×2): 1 via ORAL
  Filled 2019-03-08 (×2): qty 1

## 2019-03-08 MED ORDER — LORAZEPAM 2 MG/ML IJ SOLN: 1.0000 mg | Freq: Four times a day (QID) | INTRAMUSCULAR | Status: DC | PRN

## 2019-03-08 MED ORDER — THIAMINE HCL 100 MG/ML IJ SOLN: 100.0000 mg | Freq: Every day | INTRAMUSCULAR | Status: DC

## 2019-03-08 MED ORDER — ZOLPIDEM TARTRATE 5 MG PO TABS
5.0000 mg | ORAL_TABLET | Freq: Every evening | ORAL | Status: DC | PRN
  Administered 2019-03-08 (×2): 5 mg via ORAL
  Filled 2019-03-08 (×2): qty 1

## 2019-03-08 MED ORDER — FOLIC ACID 1 MG PO TABS
1.0000 mg | ORAL_TABLET | Freq: Every day | ORAL | Status: DC
  Administered 2019-03-08 – 2019-03-09 (×2): 1 mg via ORAL
  Filled 2019-03-08 (×2): qty 1

## 2019-03-08 MED ORDER — IRBESARTAN 75 MG PO TABS
37.5000 mg | ORAL_TABLET | Freq: Every day | ORAL | Status: DC
  Administered 2019-03-08 – 2019-03-09 (×2): 37.5 mg via ORAL
  Filled 2019-03-08 (×2): qty 0.5

## 2019-03-08 MED ORDER — HYDROMORPHONE HCL 1 MG/ML IJ SOLN
0.5000 mg | INTRAMUSCULAR | Status: DC | PRN
  Administered 2019-03-08 – 2019-03-09 (×6): 0.5 mg via INTRAVENOUS
  Filled 2019-03-08 (×6): qty 1

## 2019-03-08 MED ORDER — LORAZEPAM 2 MG/ML IJ SOLN: 0.0000 mg | Freq: Four times a day (QID) | INTRAMUSCULAR | Status: DC

## 2019-03-08 MED ORDER — LIDOCAINE HCL 1 % IJ SOLN
INTRAMUSCULAR | Status: AC
  Filled 2019-03-08: qty 20

## 2019-03-08 NOTE — Procedures (Signed)
PROCEDURE SUMMARY:  Successful US guided paracentesis from right lateral abdomen.  Yielded 1.8 liters of yellow fluid.  No immediate complications.  Pt tolerated well.   Specimen was sent for labs.  EBL < 86mL  Docia Barrier PA-C 03/08/2019 12:18 PM

## 2019-03-08 NOTE — Progress Notes (Signed)
PROGRESS NOTE  Midge AverDavid A Meding WUJ:[id number] DOB: 1960/12/25   PCP: [name], Walter, MD  Patient is from: Abdominal pain/distention  DOA: 03/07/2019 LOS: 1  Brief Narrative / Interim history: 58 y.o. male with history of arthritis, HTN, HLD and alcohol use disorder presenting with multiple GI symptoms including abdominal pain, distention, nausea, rectal bleed and loss of appetite for months. Recently referred to GI by PCP but symptoms gotten worse and he presented to ED.  In ED, HDS.  Hgb 12.4 with MCV of 105.  Mildly elevated ALP/AST/ALT/T bili.  PT/INR within normal range.  FOBT negative.  EKG NSR.  Abdominal ultrasound concerning for cirrhosis, moderate ascites, mild diffuse gallbladder wall thickening and possible portal vein thrombosis.  CT abdomen and pelvis revealed hepatomegaly with inhomogeneous hepatic cirrhosis and extensive ascites but no portal vein thrombosis.  GI, Dr. Loreta AveMann consulted and [facility].    Subjective: Continues to endorse significant abdominal pain from distention.  Some difficulty breathing due to abdominal distention.  Denies chest pain.  Seems uncomfortable.  Has not had a bowel movement in 2 days.  Reports passing gas.  No urinary symptoms.  Denies fever or chills.  Objective: Vitals:   03/08/19 0400 03/08/19 0500 03/08/19 0600 03/08/19 0800  BP:   128/70   Pulse:  79 80   Resp:  (!) 8 10   Temp: 98.1 F (36.7 C)   98.4 F (36.9 C)  TempSrc: Oral   Oral  SpO2:  94% 92%   Weight:      Height:        Intake/Output Summary (Last 24 hours) at 03/08/2019 1057 Last data filed at 03/08/2019 0600 Gross per 24 hour  Intake 680.54 ml  Output -  Net 680.54 ml   Filed Weights   03/07/19 1302 03/08/19 0100 03/08/19 0123  Weight: 95.3 kg 92.8 kg 92.8 kg    Examination:  GENERAL: Lying in bed.  Appears uncomfortable. HEENT: MMM.  Vision and hearing grossly intact.  NECK: Supple.  No apparent JVD.  RESP:  No IWOB. Good air movement  bilaterally. CVS:  RRR. Heart sounds normal.  ABD/GI/GU: Bowel sounds present.  Abdominal distention with diffuse tenderness to deep palpation. MSK/EXT:  Moves extremities. No apparent deformity or edema.  SKIN: no apparent skin lesion or wound NEURO: Awake, alert and oriented appropriately but looks slow.  No gross deficit.  PSYCH: Calm. Normal affect.    I have personally reviewed the following labs and images:  Radiology Studies: Ct Abdomen Pelvis W Contrast  Result Date: 03/07/2019 CLINICAL DATA:  Acute abdominal pain. EXAM: CT ABDOMEN AND PELVIS WITH CONTRAST TECHNIQUE: Multidetector CT imaging of the abdomen and pelvis was performed using the standard protocol following bolus administration of intravenous contrast. CONTRAST:  100mL OMNIPAQUE IOHEXOL 300 MG/ML  SOLN COMPARISON:  None. FINDINGS: Lower chest: Normal. Hepatobiliary: Hepatomegaly with inhomogeneous hepatic steatosis. Biliary tree appears normal. Pancreas: Unremarkable. No pancreatic ductal dilatation or surrounding inflammatory changes. Spleen: Normal in size without focal abnormality. Adrenals/Urinary Tract: Adrenal glands are unremarkable. Kidneys are normal, without renal calculi, focal lesion, or hydronephrosis. Bladder is unremarkable. Stomach/Bowel: Stomach is within normal limits. Appendix has been removed. No evidence of bowel wall thickening, distention, or inflammatory changes. Vascular/Lymphatic: Aortic atherosclerosis. No enlarged abdominal or pelvic lymph nodes. Reproductive: Prostate is unremarkable. Other: Extensive ascites. No hernias. Musculoskeletal: No acute or significant osseous findings. IMPRESSION: 1. Hepatomegaly with inhomogeneous hepatic steatosis. 2. Extensive ascites. Aortic Atherosclerosis (ICD10-I70.0). Electronically Signed   By: Fayrene FearingJames  Maxwell M.D.   On: 03/07/2019 15:57   Dg Abd Portable 1 View  Result Date: 03/07/2019 CLINICAL DATA:  Abdominal pain EXAM: PORTABLE ABDOMEN - 1 VIEW COMPARISON:  None.  FINDINGS: The bowel gas pattern is normal. No radio-opaque calculi or other significant radiographic abnormality are seen. IMPRESSION: Negative. Electronically Signed   By: Deatra RobinsonKevin  Herman M.D.   On: 03/07/2019 21:00   [place] Abdomen Limited Ruq  Result Date: 03/07/2019 CLINICAL DATA:  Abdominal pain for the past 2 months. EXAM: ULTRASOUND ABDOMEN LIMITED RIGHT UPPER QUADRANT COMPARISON:  Item ultrasound report dated 12/25/2010. FINDINGS: Gallbladder: Minimal diffuse wall thickening measuring 3.2 mm in maximum thickness. No gallstones or sonographic Murphy sign. There is a small amount of pericholecystic fluid. Common bile duct: Diameter: 6.1 mm Liver: Diffusely echogenic, mildly heterogeneous and possibly enlarged with minimal contour irregularity. No mass is seen. The main portal vein is somewhat small with a suggestion of partial thrombosis or thrombosis with cavernous transformation. Other: Moderate amount of ascites. IMPRESSION: 1. Possible changes of cirrhosis of the liver with and associated moderate amount of ascites and small amount of pericholecystic fluid. 2. Mild diffuse gallbladder wall thickening, most likely due to hypoproteinemia. 3. Possible partial thrombosis of the portal vein or complete thrombosis with cavernous transformation. Electronically Signed   By: Beckie SaltsSteven  Reid M.D.   On: 03/07/2019 14:52    Microbiology: Recent Results (from the past 240 hour(s))  SARS Coronavirus 2 [facility](Hospital order, Performed in [facility] lab) Nasopharyngeal Nasopharyngeal Swab     Status: None   Collection Time: 03/07/19  5:28 PM   Specimen: Nasopharyngeal Swab  Result Value Ref Range Status   SARS Coronavirus 2 NEGATIVE NEGATIVE Final    Comment: (NOTE) If result is NEGATIVE SARS-CoV-2 target nucleic acids are NOT DETECTED. The SARS-CoV-2 RNA is generally detectable in upper and lower  respiratory specimens during the acute phase of infection. The lowest  concentration of SARS-CoV-2 viral copies  this assay can detect is 250  copies / mL. A negative result does not preclude SARS-CoV-2 infection  and should not be used as the sole basis for treatment or other  patient management decisions.  A negative result may occur with  improper specimen collection / handling, submission of specimen other  than nasopharyngeal swab, presence of viral mutation(s) within the  areas targeted by this assay, and inadequate number of viral copies  (<250 copies / mL). A negative result must be combined with clinical  observations, patient history, and epidemiological information. If result is POSITIVE SARS-CoV-2 target nucleic acids are DETECTED. The SARS-CoV-2 RNA is generally detectable in upper and lower  respiratory specimens dur ing the acute phase of infection.  Positive  results are indicative of active infection with SARS-CoV-2.  Clinical  correlation with patient history and other diagnostic information is  necessary to determine patient infection status.  Positive results do  not rule out bacterial infection or co-infection with other viruses. If result is PRESUMPTIVE POSTIVE SARS-CoV-2 nucleic acids MAY BE PRESENT.   A presumptive positive result was obtained on the submitted specimen  and confirmed on repeat testing.  While 2019 novel coronavirus  (SARS-CoV-2) nucleic acids may be present in the submitted sample  additional confirmatory testing may be necessary for epidemiological  and / or clinical management purposes  to differentiate between  SARS-CoV-2 and other Sarbecovirus currently known to infect humans.  If clinically indicated additional testing with an alternate test  methodology [phone] is advised. The SARS-CoV-2 RNA is generally  detectable in upper and lower respiratory sp ecimens during the acute  phase of infection. The expected result is Negative. Fact Sheet for Patients:  BoilerBrush.com.cyhttps://www.fda.gov/media/136312/download Fact Sheet for Healthcare Providers:  https://pope.com/https://www.fda.gov/media/136313/download This test is not yet approved or cleared by the [place] FDA and has been authorized for detection and/or diagnosis of SARS-CoV-2 by FDA under an Emergency Use Authorization (EUA).  This EUA will remain in effect (meaning this test can be used) for the duration of the COVID-19 declaration under Section 564(b)(1) of the Act, 21 U.S.C. section 360bbb-3(b)(1), unless the authorization is terminated or revoked sooner. Performed at [facility], [address] [place], [place] [address]   MRSA PCR Screening     Status: None   Collection Time: 03/08/19  6:30 AM   Specimen: Nasal Mucosa; Nasopharyngeal  Result Value Ref Range Status   MRSA by PCR NEGATIVE NEGATIVE Final    Comment:        The GeneXpert MRSA Assay (FDA approved for NASAL specimens only), is one component of a comprehensive MRSA colonization surveillance program. It is not intended to diagnose MRSA infection nor to guide or monitor treatment for MRSA infections. Performed at [facility], [address] [place], [place] [address]     Sepsis Labs: Invalid input(s): PROCALCITONIN, LACTICIDVEN  Urine analysis:    Component Value Date/Time   COLORURINE ORANGE (A) 03/07/2019 1630   APPEARANCEUR CLEAR 03/07/2019 1630   LABSPEC 1.020 03/07/2019 1630   PHURINE 5.5 03/07/2019 1630   GLUCOSEU 100 (A) 03/07/2019 1630   HGBUR NEGATIVE 03/07/2019 1630   BILIRUBINUR MODERATE (A) 03/07/2019 1630   KETONESUR 15 (A) 03/07/2019 1630   PROTEINUR 30 (A) 03/07/2019 1630   NITRITE POSITIVE (A) 03/07/2019 1630   LEUKOCYTESUR NEGATIVE 03/07/2019 1630    Anemia Panel: No results for input(s): VITAMINB12, FOLATE, FERRITIN, TIBC, IRON, RETICCTPCT in the last 72 hours.  Thyroid Function Tests: No results for input(s): TSH, T4TOTAL, FREET4, T3FREE, THYROIDAB in the last 72 hours.  Lipid Profile: No results for input(s): CHOL, HDL, LDLCALC, TRIG,  CHOLHDL, LDLDIRECT in the last 72 hours.  CBG: Recent Labs  Lab 03/08/19 0807  GLUCAP 100*    HbA1C: No results for input(s): HGBA1C in the last 72 hours.  BNP (last 3 results): No results for input(s): PROBNP in the last 8760 hours.  Cardiac Enzymes: No results for input(s): CKTOTAL, CKMB, CKMBINDEX, TROPONINI in the last 168 hours.  Coagulation Profile: Recent Labs  Lab 03/07/19 1312  INR 1.2    Liver Function Tests: Recent Labs  Lab 03/07/19 1312 03/08/19 0210 03/08/19 0427  AST 129* 136* 130*  ALT 48* 53* 50*  ALKPHOS 209* 224* 219*  BILITOT 1.6* 2.1* 2.1*  PROT 6.6 7.2 7.2  ALBUMIN 3.5 3.9 3.8   Recent Labs  Lab 03/07/19 1312  LIPASE 45   Recent Labs  Lab 03/08/19 1008  AMMONIA 69*    Basic Metabolic Panel: Recent Labs  Lab 03/07/19 1312 03/08/19 0210 03/08/19 0427  NA 139 137 137  K 3.9 4.8 4.4  CL 105 102 101  CO2 23 22 25   GLUCOSE 107* 116* 116*  BUN 6 9 10   CREATININE 0.64 0.94 1.04  CALCIUM 8.2* 7.9* 7.8*   GFR: Estimated Creatinine Clearance: 85.1 mL/min (by C-G formula based on SCr of 1.04 mg/dL).  CBC: Recent Labs  Lab 03/07/19 1312 03/08/19 0427  WBC 9.8 11.7*  NEUTROABS 6.7  --   HGB 12.4* 12.7*  HCT 37.2*  38.4*  MCV 104.8* 110.0*  PLT 242 258    Procedures:  None  Microbiology summarized: COVID-19 negative.  Assessment & Plan: Abdominal pain/distention:  due to ascites/cirrhosis likely from alcohol.  Cannot exclude SBP.  CT did not show SBO.  He is passing gases.  Abdomen distended and tender. -Possible partial portal vein thrombosis seen on ultrasound excluded by CT.  -Ultrasound-guided therapeutic and diagnostic paracentesis ordered. -Continue prophylactic ceftriaxone -We will continue Dilaudid pending paracentesis. -Remains n.p.o. -Lasix and spironolactone.  Elevated liver enzymes/ALP/T bili: Likely due to the above. -Continue monitoring -Follow GI recommendation -Check acute hepatitis panel, PT/INR  and ammonia level.  Rectal bleed: Reportedly normal colonoscopy was 7 years ago.  Has not had further bleeding in 2 days although he has not had bowel movement.  H&H stable. -GI, Dr. Collene Mares consulted by EDP. -Continue Protonix -Check anemia panel -N.p.o.  Alcohol use disorder -Continue CIWA protocol -Thiamine and multivitamins  Psoriatic arthritis -Discontinue Cosentyx for now.  HTN: Normotensive this morning. -Continue Avapro  History of anxiety -Continue home BuSpar  Prediabetes:: On metformin at home. -SSI and sliding scale insulin.  DVT prophylaxis: SCD Code Status: Full code Family Communication: Patient and/or RN. Available if any question.  Disposition Plan: Remains inpatient. Consultants: Gastroenterology   Antimicrobials: Anti-infectives (From admission, onward)   Start     Dose/Rate Route Frequency Ordered Stop   03/07/19 2115  cefTRIAXone (ROCEPHIN) 1 g in sodium chloride 0.9 % 100 mL IVPB     1 g 200 mL/hr over 30 Minutes Intravenous Every 24 hours 03/07/19 2108        Sch Meds:  Scheduled Meds: . lidocaine      . azelastine  1 spray Each Nare BID  . busPIRone  15 mg Oral BID  . cetirizine  10 mg Oral QPM  . folic acid  1 mg Oral Daily  . furosemide  20 mg Oral Daily  . insulin aspart  0-9 Units Subcutaneous Q4H  . irbesartan  37.5 mg Oral Daily  . LORazepam  0-4 mg Intravenous Q6H   Followed by  . [START ON 03/10/2019] LORazepam  0-4 mg Intravenous Q12H  .  morphine injection  4 mg Intravenous Once  . multivitamin with minerals  1 tablet Oral Daily  . sodium chloride flush  3 mL Intravenous Q12H  . spironolactone  50 mg Oral Daily  . thiamine  100 mg Oral Daily   Or  . thiamine  100 mg Intravenous Daily   Continuous Infusions: . sodium chloride 10 mL/hr at 03/08/19 0600  . sodium chloride    . cefTRIAXone (ROCEPHIN)  IV Stopped (03/07/19 2204)   PRN Meds:.sodium chloride, sodium chloride, clonazePAM, fluticasone, HYDROmorphone (DILAUDID)  injection, LORazepam **OR** LORazepam, ondansetron (ZOFRAN) IV, sodium chloride flush, zolpidem  35 minutes with more than 50% spent in reviewing records, counseling patient and coordinating care.  Taye T. [place]  If 7PM-7AM, please contact night-coverage [email or web address] Password [email or web address] 03/08/2019, 10:57 AM

## 2019-03-09 ENCOUNTER — Inpatient Hospital Stay (HOSPITAL_COMMUNITY): Payer: BC Managed Care – PPO

## 2019-03-09 DIAGNOSIS — R188 Other ascites: Secondary | ICD-10-CM

## 2019-03-09 DIAGNOSIS — R140 Abdominal distension (gaseous): Secondary | ICD-10-CM

## 2019-03-09 DIAGNOSIS — R945 Abnormal results of liver function studies: Secondary | ICD-10-CM

## 2019-03-09 DIAGNOSIS — R740 Nonspecific elevation of levels of transaminase and lactic acid dehydrogenase [LDH]: Secondary | ICD-10-CM

## 2019-03-09 DIAGNOSIS — R197 Diarrhea, unspecified: Secondary | ICD-10-CM

## 2019-03-09 LAB — HEPATITIS PANEL, ACUTE
HCV Ab: <0.1 s/co ratio (ref 0.0–0.9)
Hep A IgM: NEGATIVE
Hep B C IgM: NEGATIVE
Hepatitis B Surface Ag: NEGATIVE

## 2019-03-09 LAB — COMPREHENSIVE METABOLIC PANEL
ALT: 42 U/L (ref 0–44)
AST: 108 U/L — ABNORMAL HIGH (ref 15–41)
Albumin: 3.3 g/dL — ABNORMAL LOW (ref 3.5–5.0)
Alkaline Phosphatase: 187 U/L — ABNORMAL HIGH (ref 38–126)
Anion gap: 10 (ref 5–15)
BUN: 10 mg/dL (ref 6–20)
CO2: 26 mmol/L (ref 22–32)
Calcium: 7.7 mg/dL — ABNORMAL LOW (ref 8.9–10.3)
Chloride: 100 mmol/L (ref 98–111)
Creatinine, Ser: 0.77 mg/dL (ref 0.61–1.24)
GFR calc Af Amer: >60 mL/min (ref >60)
GFR calc non Af Amer: >60 mL/min (ref >60)
Glucose, Bld: 104 mg/dL — ABNORMAL HIGH (ref 70–99)
Potassium: 3.9 mmol/L (ref 3.5–5.1)
Sodium: 136 mmol/L (ref 135–145)
Total Bilirubin: 1.9 mg/dL — ABNORMAL HIGH (ref 0.3–1.2)
Total Protein: 6.6 g/dL (ref 6.5–8.1)

## 2019-03-09 LAB — GLUCOSE, CAPILLARY
Glucose-Capillary: 108 mg/dL — ABNORMAL HIGH (ref 70–99)
Glucose-Capillary: 109 mg/dL — ABNORMAL HIGH (ref 70–99)
Glucose-Capillary: 108 mg/dL — ABNORMAL HIGH (ref 70–99)
Glucose-Capillary: 107 mg/dL — ABNORMAL HIGH (ref 70–99)

## 2019-03-09 LAB — AMMONIA: Ammonia: 56 umol/L — ABNORMAL HIGH (ref 9–35)

## 2019-03-09 LAB — PROTIME-INR
INR: 1.2 (ref 0.8–1.2)
Prothrombin Time: 15.2 seconds (ref 11.4–15.2)

## 2019-03-09 LAB — CBC
HCT: 35 % — ABNORMAL LOW (ref 39.0–52.0)
Hemoglobin: 11.7 g/dL — ABNORMAL LOW (ref 13.0–17.0)
MCH: 36.4 pg — ABNORMAL HIGH (ref 26.0–34.0)
MCHC: 33.4 g/dL (ref 30.0–36.0)
MCV: 109 fL — ABNORMAL HIGH (ref 80.0–100.0)
Platelets: 211 10*3/uL (ref 150–400)
RBC: 3.21 MIL/uL — ABNORMAL LOW (ref 4.22–5.81)
RDW: 14.6 % (ref 11.5–15.5)
WBC: 8 10*3/uL (ref 4.0–10.5)
nRBC: 0 % (ref 0.0–0.2)

## 2019-03-09 LAB — MAGNESIUM: Magnesium: 1.8 mg/dL (ref 1.7–2.4)

## 2019-03-09 LAB — HIV ANTIBODY (ROUTINE TESTING W REFLEX): HIV Screen 4th Generation wRfx: NONREACTIVE

## 2019-03-09 MED ORDER — FUROSEMIDE 40 MG PO TABS: 40.0000 mg | ORAL_TABLET | Freq: Every day | ORAL | 0 refills | Status: DC

## 2019-03-09 MED ORDER — SPIRONOLACTONE 100 MG PO TABS: 100.0000 mg | ORAL_TABLET | Freq: Every day | ORAL | Status: DC

## 2019-03-09 MED ORDER — LACTULOSE 10 GM/15ML PO SOLN: ORAL | 0 refills | Status: DC

## 2019-03-09 MED ORDER — THIAMINE HCL 100 MG PO TABS: 100.0000 mg | ORAL_TABLET | Freq: Every day | ORAL | 0 refills | Status: DC

## 2019-03-09 MED ORDER — SPIRONOLACTONE 100 MG PO TABS: 100.0000 mg | ORAL_TABLET | Freq: Every day | ORAL | 0 refills | Status: DC

## 2019-03-09 MED ORDER — ALBUMIN HUMAN 25 % IV SOLN
50.0000 g | Freq: Once | INTRAVENOUS | Status: DC
  Filled 2019-03-09: qty 100

## 2019-03-09 MED ORDER — FUROSEMIDE 40 MG PO TABS: 40.0000 mg | ORAL_TABLET | Freq: Every day | ORAL | Status: DC

## 2019-03-09 MED ORDER — CHLORHEXIDINE GLUCONATE CLOTH 2 % EX PADS: 6.0000 | MEDICATED_PAD | Freq: Every day | CUTANEOUS | Status: DC

## 2019-03-09 MED ORDER — LACTULOSE 10 GM/15ML PO SOLN
20.0000 g | Freq: Two times a day (BID) | ORAL | Status: DC
  Administered 2019-03-09: 20 g via ORAL
  Filled 2019-03-09: qty 30

## 2019-03-09 NOTE — Progress Notes (Signed)
The AVS discharge instructions were reviewed and given to the patient and his wife. They have no questions. Will D/C patient home to his wife's car via wheelchair.

## 2019-03-09 NOTE — Progress Notes (Signed)
Request to IR for repeat therapeutic paracentesis due to persistent abdominal distention. Patient underwent therapeutic paracentesis 8/6 in IR yielding 1.8 L of peritoneal fluid.  Limited abdominal US of all four quadrants show only very small amount of perihepatic fluid which is not amenable to percutaneous drainage. Images reviewed with patient who states understanding. No procedure performed today.  Please call IR with questions or concerns.  Candiss Norse, PA-C Pager [phone]

## 2019-03-09 NOTE — Consult Note (Signed)
Consultation  Referring Provider: [facility]/ Cyndia Skeeters MD Primary Care Physician:  Deland Pretty, MD Primary Gastroenterologist:  Dr. Verl Blalock- remote  Reason for Consultation: Abdominal pain, recent rectal bleeding  HPI: Andrew Patton is a 58 y.o. male, who was admitted on 03/07/2019 after presenting to the emergency room with complaints of abdominal pain, progressive over the past few weeks and He had also been having abdominal bloating, gassiness and intermittent diarrhea over the past 2 weeks .  He had reported black stool but had been taking Pepto-Bismol, and was documented heme negative on admit. On discussion with the patient and his wife today, patient's wife relates that he had onset of abdominal fullness and distention in April which had gradually worsened over the past few months, and then acutely worsened over the past week.  He had become very uncomfortable has been eating less due to abdominal fullness, and has had a difficult time sleeping due to abdominal pressure/pain.  No fever or chills at home, no nausea or vomiting.  Apparently he switched from Enbrel to Cosentyx for cost reasons about 6 weeks ago and says the diarrhea started shortly thereafter.  Over the past 2 weeks he had noted some intermittent bright red blood both on the tissue and some in the commode.  He has no complaints of abdominal cramping, no anal rectal pain or hemorrhoidal type symptoms.  They stopped the Cosentyx about 2 weeks ago and the diarrhea has completely resolved and he is not seeing any evidence of hematochezia over the past week.  Patient's wife had researched side effects and feels that the diarrhea was from Cosentyx. Patient relates being told that he had fatty liver many years ago.  He says he has had "high enzymes" for many years.  He had also been referred to a hepatologist by his report about 5 years ago it sounds as if he may have been seen by the [facility] hepatology clinic though he cannot  recall a provider name.  He had undergone some testing there and was told that he had fatty liver.  He had never been told that he had cirrhosis. There is no family history of liver disease that he is aware of, otitis serologies are negative. He does have significant history of EtOH use over 20+ years.  He admits to being a heavy drinker in the past but says he has not been drinking much over the past several months.  He reports drinking a couple of glasses of wine per day and generally more on the weekends  Patient has history of hypertension, hyperlipidemia, psoriatic arthritis for which he had been on Enbrel and recently switched to Cosentyx and history of EtOH abuse. He had undergone colonoscopy with Dr. Sharlett Iles in 2014 for screening and this was a normal exam.  He has not had any GI evaluation since.  Work-up on admit with upper abdominal ultrasound showing diffuse gallbladder wall thickening, CBD 6 mm diffusely echogenic liver, moderate ascites and noted main portal vein somewhat small question partial thrombus or cavernous transformation CT of the abdomen pelvis showed hepatomegaly and significant hepatic steatosis, extensive ascites He underwent ultrasound-guided paracentesis yesterday with removal of 1.8 L.-Cytology is pending, I reviewed cell counts, WBC 254, neutrophils 5 not consistent with SBP Labs; hemoglobin 12.7 on admit down to 11.4 today, MCV 109 platelets 211 Creatinine 0.77 T bili 1.9 alk phos 187 AST 108 ALT 42 Acute hepatitis panel negative INR 1.2  He feels better today after paracentesis, and is not  having severe abdominal pain as he was on admission but still feels very tight and full.   Past Medical History:  Diagnosis Date   Allergy    Anxiety    Arthritis    Fatty liver    Hyperlipidemia    Hypertension    Pre-diabetes    Psoriasis     Past Surgical History:  Procedure Laterality Date   APPENDECTOMY     NOSE SURGERY     ROTATOR CUFF REPAIR      left    Prior to Admission medications   Medication Sig Start Date End Date Taking? Authorizing Provider  aspirin 81 MG tablet Take 81 mg by mouth daily.   Yes [provider]  azelastine (ASTELIN) 0.1 % nasal spray Place 1 spray into both nostrils 2 (two) times daily. Use in each nostril as directed   Yes [provider]  busPIRone (BUSPAR) 15 MG tablet Take 15 mg by mouth 2 (two) times daily.    Yes [provider]  Cholecalciferol (VITAMIN D3) 1000 UNITS CAPS Take 1 capsule by mouth daily.    Yes [provider]  clonazePAM (KLONOPIN) 0.5 MG tablet Take 0.5 mg by mouth daily as needed for anxiety.    Yes [provider]  colesevelam (WELCHOL) 625 MG tablet Take 1,250 mg by mouth daily.   Yes [provider]  ezetimibe (ZETIA) 10 MG tablet Take 10 mg by mouth daily.   Yes [provider]  fluticasone (FLONASE) 50 MCG/ACT nasal spray Place 1 spray into both nostrils daily as needed for allergies.  01/05/13  Yes [provider]  Javier Docker Oil 500 MG CAPS Take 500 mg by mouth daily.   Yes [provider]  levocetirizine (XYZAL) 5 MG tablet Take 5 mg by mouth every evening.   Yes [provider]  metFORMIN (GLUCOPHAGE) 500 MG tablet Take 500 mg by mouth every evening.   Yes [provider]  olmesartan (BENICAR) 40 MG tablet Take 20 mg by mouth daily.    Yes [provider]  pantoprazole (PROTONIX) 40 MG tablet Take 40 mg by mouth daily.   Yes [provider]  Secukinumab (COSENTYX) 150 MG/ML SOSY Inject 150 mg into the skin every 30 (thirty) days.   Yes [provider]  vitamin E 1000 UNIT capsule Take 1,000 Units by mouth daily.   Yes [provider]  zolpidem (AMBIEN CR) 12.5 MG CR tablet Take 12.5 mg by mouth at bedtime as needed for sleep.   Yes [provider]  doxycycline (VIBRAMYCIN) 100 MG capsule Take 1 capsule (100 mg total) by mouth 2 (two) times  daily. Patient not taking: Reported on 04/19/2017 04/06/17   Pisciotta, Elmyra Ricks, PA-C    Current Facility-Administered Medications  Medication Dose Route Frequency Provider Last Rate Last Dose   0.9 %  sodium chloride infusion   Intravenous PRN Blanchie Dessert, MD 10 mL/hr at 03/08/19 0600     0.9 %  sodium chloride infusion  250 mL Intravenous PRN Jani Gravel, MD       azelastine (ASTELIN) 0.1 % nasal spray 1 spray  1 spray Each Nare BID Jani Gravel, MD       busPIRone (BUSPAR) tablet 15 mg  15 mg Oral BID Jani Gravel, MD   15 mg at 03/09/19 0844   cefTRIAXone (ROCEPHIN) 1 g in sodium chloride 0.9 % 100 mL IVPB  1 g Intravenous Q24H Volanda Napoleon, PA-C   Stopped at 03/08/19 2203  cetirizine (ZYRTEC) tablet 10 mg  10 mg Oral QPM Jani Gravel, MD   10 mg at 03/08/19 1716   Chlorhexidine Gluconate Cloth 2 % PADS 6 each  6 each Topical Daily Mercy Riding, MD       clonazePAM (KLONOPIN) tablet 0.5 mg  0.5 mg Oral Daily PRN Jani Gravel, MD       fluticasone (FLONASE) 50 MCG/ACT nasal spray 1 spray  1 spray Each Nare Daily PRN Jani Gravel, MD       folic acid (FOLVITE) tablet 1 mg  1 mg Oral Daily Jani Gravel, MD   1 mg at 03/09/19 9622   furosemide (LASIX) tablet 20 mg  20 mg Oral Daily Jani Gravel, MD   20 mg at 03/09/19 2979   HYDROmorphone (DILAUDID) injection 0.5 mg  0.5 mg Intravenous Q4H PRN Jani Gravel, MD   0.5 mg at 03/09/19 0840   insulin aspart (novoLOG) injection 0-9 Units  0-9 Units Subcutaneous Q4H Jani Gravel, MD       irbesartan (AVAPRO) tablet 37.5 mg  37.5 mg Oral Daily Jani Gravel, MD   37.5 mg at 03/09/19 0844   lactulose (CHRONULAC) 10 GM/15ML solution 20 g  20 g Oral BID Wendee Beavers T, MD   20 g at 03/09/19 0850   LORazepam (ATIVAN) injection 0-4 mg  0-4 mg Intravenous Q6H Jani Gravel, MD       Followed by   Derrill Memo ON 03/10/2019] LORazepam (ATIVAN) injection 0-4 mg  0-4 mg Intravenous Q12H Jani Gravel, MD       LORazepam (ATIVAN) tablet 1 mg  1 mg Oral Q6H PRN Jani Gravel, MD       Or   LORazepam (ATIVAN) injection 1 mg  1 mg Intravenous Q6H PRN Jani Gravel, MD       morphine 4 MG/ML injection 4 mg  4 mg Intravenous Once Providence Lanius A, PA-C       multivitamin with minerals tablet 1 tablet  1 tablet Oral Daily Jani Gravel, MD   1 tablet at 03/09/19 0842   ondansetron [facility]) injection 4 mg  4 mg Intravenous Q6H PRN Jani Gravel, MD       sodium chloride flush (NS) 0.9 % injection 3 mL  3 mL Intravenous Q12H Jani Gravel, MD   3 mL at 03/09/19 0845   sodium chloride flush (NS) 0.9 % injection 3 mL  3 mL Intravenous PRN Jani Gravel, MD       spironolactone (ALDACTONE) tablet 50 mg  50 mg Oral Daily Jani Gravel, MD   50 mg at 03/09/19 0845   thiamine (VITAMIN B-1) tablet 100 mg  100 mg Oral Daily Jani Gravel, MD   100 mg at 03/09/19 0845   Or   thiamine (B-1) injection 100 mg  100 mg Intravenous Daily Jani Gravel, MD       zolpidem Lorrin Mais) tablet 5 mg  5 mg Oral QHS PRN Jani Gravel, MD   5 mg at 03/08/19 2110    Allergies as of 03/07/2019 - Review Complete 03/07/2019  Allergen Reaction Noted   Penicillins Rash 01/24/2013    Family History  Problem Relation Age of Onset   Breast cancer Mother    Hyperlipidemia Mother    Bladder Cancer Father    Birth defects Maternal Grandfather    Birth defects Paternal Grandmother    Colon cancer Neg Hx    Esophageal cancer Neg Hx    Rectal cancer Neg Hx    Stomach  cancer Neg Hx     Social History   Socioeconomic History   Marital status: Married    Spouse name: Not on file   Number of children: Not on file   Years of education: Not on file   Highest education level: Not on file  Occupational History   Not on file  Social Needs   Financial resource strain: Not on file   Food insecurity    Worry: Not on file    Inability: Not on file   Transportation needs    Medical: Not on file    Non-medical: Not on file  Tobacco Use   Smoking status: Former Smoker   Smokeless tobacco:  Never Used  Substance and Sexual Activity   Alcohol use: Yes    Alcohol/week: 2.0 standard drinks    Types: 2 Glasses of wine per week    Comment: daily   Drug use: No   Sexual activity: Not on file  Lifestyle   Physical activity    Days per week: Not on file    Minutes per session: Not on file   Stress: Not on file  Relationships   Social connections    Talks on phone: Not on file    Gets together: Not on file    Attends religious service: Not on file    Active member of club or organization: Not on file    Attends meetings of clubs or organizations: Not on file    Relationship status: Not on file   Intimate partner violence    Fear of current or ex partner: Not on file    Emotionally abused: Not on file    Physically abused: Not on file    Forced sexual activity: Not on file  Other Topics Concern   Not on file  Social History Narrative   Not on file    Review of Systems: Pertinent positive and negative review of systems were noted in the above HPI section.  All other review of systems was otherwise negative.  Physical Exam: Vital signs in last 24 hours: Temp:  [98.8 F (37.1 C)-99.4 F (37.4 C)] 99.1 F (37.3 C) (08/07 0800) Pulse Rate:  [87] 87 (08/06 1800) Resp:  [15-16] 16 (08/06 1800) BP: (119-150)/(63-98) 150/98 (08/07 0414) SpO2:  [94 %-97 %] 94 % (08/06 1800) Last BM Date: 03/08/19 General:   Alert,  Well-developed, well-nourished, pleasant and cooperative in NAD.  Wife at bedside Head:  Normocephalic and atraumatic. Eyes:  Sclera clear, no icterus.   Conjunctiva pink. Ears:  Normal auditory acuity. Nose:  No deformity, discharge,  or lesions. Mouth:  No deformity or lesions.   Neck:  Supple; no masses or thyromegaly. Lungs:  Clear throughout to auscultation.   No wheezes, crackles, or rhonchi.  Heart:  Regular rate and rhythm; no murmurs, clicks, rubs,  or gallops. Abdomen: Tightly distended, and diffusely tender.  Bowel sounds are present no  definite fluid wave but abdomen fairly tense no palpable mass or definite hepatosplenomegaly palpable Rectal:  Deferred-documented heme negative on admit Msk:  Symmetrical without gross deformities. . Pulses:  Normal pulses noted. Extremities:  Without clubbing or edema. Neurologic:  Alert and  oriented x4;  grossly normal neurologically.  No asterixis, no tremor Skin:  Intact without significant lesions or rashes.. Psych:  Alert and cooperative. Normal mood and affect.  Intake/Output from previous day: 08/06 0701 - 08/07 0700 In: 3.5 [I.V.:3.5] Out: 550 [Urine:550] Intake/Output this shift: No intake/output data recorded.  Lab Results:  Recent Labs    03/07/19 1312 03/08/19 0427 03/09/19 0207  WBC 9.8 11.7* 8.0  HGB 12.4* 12.7* 11.7*  HCT 37.2* 38.4* 35.0*  PLT 242 258 211   BMET Recent Labs    03/08/19 0210 03/08/19 0427 03/09/19 0207  NA 137 137 136  K 4.8 4.4 3.9  CL 102 101 100  CO2 '22 25 26  '$ GLUCOSE 116* 116* 104*  BUN '9 10 10  '$ CREATININE 0.94 1.04 0.77  CALCIUM 7.9* 7.8* 7.7*   LFT Recent Labs    03/09/19 0207  PROT 6.6  ALBUMIN 3.3*  AST 108*  ALT 42  ALKPHOS 187*  BILITOT 1.9*   PT/INR Recent Labs    03/07/19 1312  LABPROT 14.9  INR 1.2   Hepatitis Panel Recent Labs    03/08/19 1008  HEPBSAG Negative  HCVAB <0.1  HEPAIGM Negative  HEPBIGM Negative      IMPRESSION:  #33 58 year old white male admitted with progressive abdominal distention and pain.  Initial onset April 2020 with gradual progression until the past week at which point he became more acutely uncomfortable and tight.  Work-up since admission is consistent with underlying cirrhosis, decompensated with ascites.  Had a large amount of ascites noted on CT scan. Paracentesis yesterday with removal of 1.8 L, somewhat helpful but still very tense Cell counts not consistent with SBP.  Cytology is pending SAAG 1.4-suggesting portal hypertension as etiology  By patient report  long history of elevated LFTs and fatty liver.  Etiology of cirrhosis likely the [place] of NASH, and EtOH  M ELD=11  CT scan suggests main portal vein somewhat small question partial thrombosis  #2 macrocytosis-likely reflective of EtOH #3 history of psoriatic arthritis 4.  Hyperlipidemia 5.  Hypertension #6 Recent diarrhea-small-volume hematochezia-resolved This may have been medication induced i.e. Cosentyx which has been stopped  PLAN: #1 reschedule for large-volume ultrasound guided paracentesis today, remove up to 6 L, have ordered albumin post paracentesis #2 await cytology #3 Doppler ultrasound abdomen-rule out portal vein thrombosis assess flow #4 start 2 g sodium diet, discussed with patient and wife #5 discontinue all alcohol #6 Lasix has been started, increase to 40 mg p.o. every morning, and add Aldactone 100 mg p.o. every morning. Patient will require close electrolyte monitoring after discharge # 7 check AFP, have also sent autoimmune and hereditary markers. #8 patient will need EGD done at some point for screening for varices, this does not need to be done inpatient and likely will be scheduled outpatient. #9 as diarrhea has resolved, do not think colonoscopy this admission indicated.  Thank you we will follow with you.  Has an appointment scheduled with Dr. Theone Murdoch eavers in our office believe on 03/22/2019.,  Can discuss with patient further after he is evaluated by Dr. Bryan Lemma regarding stat listing with Dr. Tarri Glenn versus continuing with Dr. Bryan Lemma.   Glenisha Gundry EsterwoodPA-C  03/09/2019, 10:08 AM

## 2019-03-09 NOTE — Progress Notes (Signed)
PROGRESS NOTE  Andrew AverDavid A Patton WGN:[id number] DOB: [birth year]   PCP: [name], Walter, MD  Patient is from: Abdominal pain/distention  DOA: 03/07/2019 LOS: 2  Brief Narrative / Interim history: 58 y.o. male with history of arthritis, HTN, HLD and alcohol use disorder presenting with multiple GI symptoms including abdominal pain, distention, nausea, rectal bleed and loss of appetite for months. Recently referred to GI by PCP but symptoms gotten worse and he presented to ED.  In ED, HDS.  Hgb 12.4 with MCV of 105.  Mildly elevated ALP/AST/ALT/T bili.  PT/INR within normal range.  FOBT negative.  EKG NSR.  Abdominal ultrasound concerning for cirrhosis, moderate ascites, mild diffuse gallbladder wall thickening and possible portal vein thrombosis.  CT abdomen and pelvis revealed hepatomegaly with inhomogeneous hepatic cirrhosis and extensive ascites but no portal vein thrombosis.  GI, Dr. Loreta AveMann consulted and [facility]    Subjective: Had paracentesis with removal of 1.8 L yesterday.  Fluid study does not suggest SBP.  No major events overnight of this morning.  He says he has not been able to sleep but abdominal pain and distention improved.  He denies chest pain or dyspnea.  Nausea resolved.  Tolerated solid food last night.  He is on clears this morning.  Objective: Vitals:   03/09/19 0800 03/09/19 0812 03/09/19 1200 03/09/19 1321  BP:  134/64 (!) 147/89 130/84  Pulse:  84 98 80  Resp:  16 16   Temp: 99.1 F (37.3 C) 98.7 F (37.1 C) 99 F (37.2 C)   TempSrc: Oral Oral Oral   SpO2:  94% 96% 96%  Weight:      Height:        Intake/Output Summary (Last 24 hours) at 03/09/2019 1409 Last data filed at 03/09/2019 0600 Gross per 24 hour  Intake 3.46 ml  Output 550 ml  Net -546.54 ml   Filed Weights   03/07/19 1302 03/08/19 0100 03/08/19 0123  Weight: 95.3 kg 92.8 kg 92.8 kg    Examination:  GENERAL: No acute distress.  Appears well.  HEENT: MMM.  Vision and hearing  grossly intact.  Sclera anicteric. NECK: Supple.  No apparent JVD.  RESP:  No IWOB. Good air movement bilaterally. CVS:  RRR. Heart sounds normal.  ABD/GI/GU: Bowel sounds present.  Slight distention but improved.  Mild tenderness to palpation. MSK/EXT:  Moves extremities. No apparent deformity or edema.  SKIN: no apparent skin lesion or wound NEURO: Awake, alert and oriented appropriately.  No gross deficit.  PSYCH: Calm. Normal affect.    I have personally reviewed the following labs and images:  Radiology Studies: No results found.  Microbiology: Recent Results (from the past 240 hour(s))  SARS Coronavirus 2 [facility](Hospital order, Performed in [facility] hospital lab) Nasopharyngeal Nasopharyngeal Swab     Status: None   Collection Time: 03/07/19  5:28 PM   Specimen: Nasopharyngeal Swab  Result Value Ref Range Status   SARS Coronavirus 2 NEGATIVE NEGATIVE Final    Comment: (NOTE) If result is NEGATIVE SARS-CoV-2 target nucleic acids are NOT DETECTED. The SARS-CoV-2 RNA is generally detectable in upper and lower  respiratory specimens during the acute phase of infection. The lowest  concentration of SARS-CoV-2 viral copies this assay can detect is 250  copies / mL. A negative result does not preclude SARS-CoV-2 infection  and should not be used as the sole basis for treatment or other  patient management decisions.  A negative result may occur with  improper specimen collection /  handling, submission of specimen other  than nasopharyngeal swab, presence of viral mutation(s) within the  areas targeted by this assay, and inadequate number of viral copies  (<250 copies / mL). A negative result must be combined with clinical  observations, patient history, and epidemiological information. If result is POSITIVE SARS-CoV-2 target nucleic acids are DETECTED. The SARS-CoV-2 RNA is generally detectable in upper and lower  respiratory specimens dur ing the acute phase of infection.   Positive  results are indicative of active infection with SARS-CoV-2.  Clinical  correlation with patient history and other diagnostic information is  necessary to determine patient infection status.  Positive results do  not rule out bacterial infection or co-infection with other viruses. If result is PRESUMPTIVE POSTIVE SARS-CoV-2 nucleic acids MAY BE PRESENT.   A presumptive positive result was obtained on the submitted specimen  and confirmed on repeat testing.  While 2019 novel coronavirus  (SARS-CoV-2) nucleic acids may be present in the submitted sample  additional confirmatory testing may be necessary for epidemiological  and / or clinical management purposes  to differentiate between  SARS-CoV-2 and other Sarbecovirus currently known to infect humans.  If clinically indicated additional testing with an alternate test  methodology [phone]([id number]) is advised. The SARS-CoV-2 RNA is generally  detectable in upper and lower respiratory sp ecimens during the acute  phase of infection. The expected result is Negative. Fact Sheet for Patients:  BoilerBrush.com.cyhttps://www.fda.gov/media/136312/download Fact Sheet for Healthcare Providers: https://pope.com/https://www.fda.gov/media/136313/download This test is not yet approved or cleared by the [place] FDA and has been authorized for detection and/or diagnosis of SARS-CoV-2 by FDA under an Emergency Use Authorization (EUA).  This EUA will remain in effect (meaning this test can be used) for the duration of the COVID-19 declaration under Section 564(b)(1) of the Act, 21 U.S.C. section 360bbb-3(b)(1), unless the authorization is terminated or revoked sooner. Performed at [facility] [place] [facility], [address] [place], [place] [address]   MRSA PCR Screening     Status: None   Collection Time: 03/08/19  6:30 AM   Specimen: Nasal Mucosa; Nasopharyngeal  Result Value Ref Range Status   MRSA by PCR NEGATIVE NEGATIVE Final    Comment:        The GeneXpert MRSA Assay  (FDA approved for NASAL specimens only), is one component of a comprehensive MRSA colonization surveillance program. It is not intended to diagnose MRSA infection nor to guide or monitor treatment for MRSA infections. Performed at [facility], [address] [place], [place] [address]   Body fluid culture     Status: None (Preliminary result)   Collection Time: 03/08/19 12:23 PM   Specimen: Peritoneal Fluid  Result Value Ref Range Status   Specimen Description   Final    PERITONEAL Performed at [facility], [address] [place], [place] [address]    Special Requests   Final    NONE Performed at [facility], [address] [place], [place] [address]    Gram Stain   Final    WBC PRESENT, PREDOMINANTLY MONONUCLEAR NO ORGANISMS SEEN CYTOSPIN SMEAR    Culture   Final    NO GROWTH < 24 HOURS Performed at [facility], [address] [place], [place] [address]    Report Status PENDING  Incomplete    Sepsis Labs: Invalid input(s): PROCALCITONIN, LACTICIDVEN  Urine analysis:    Component Value Date/Time   COLORURINE ORANGE (A) 03/07/2019 1630   APPEARANCEUR CLEAR  03/07/2019 1630   LABSPEC 1.020 03/07/2019 1630   PHURINE 5.5 03/07/2019 1630   GLUCOSEU 100 (A) 03/07/2019 1630   HGBUR NEGATIVE 03/07/2019 1630   BILIRUBINUR MODERATE (A) 03/07/2019 1630   KETONESUR 15 (A) 03/07/2019 1630   PROTEINUR 30 (A) 03/07/2019 1630   NITRITE POSITIVE (A) 03/07/2019 1630   LEUKOCYTESUR NEGATIVE 03/07/2019 1630    Anemia Panel: No results for input(s): VITAMINB12, FOLATE, FERRITIN, TIBC, IRON, RETICCTPCT in the last 72 hours.  Thyroid Function Tests: No results for input(s): TSH, T4TOTAL, FREET4, T3FREE, THYROIDAB in the last 72 hours.  Lipid Profile: No results for input(s): CHOL, HDL, LDLCALC, TRIG, CHOLHDL, LDLDIRECT in the last 72 hours.  CBG: Recent Labs  Lab 03/08/19 1918 03/08/19 2058 03/08/19  2304 03/09/19 0307 03/09/19 0731  GLUCAP 139* 128* 114* 108* 109*    HbA1C: No results for input(s): HGBA1C in the last 72 hours.  BNP (last 3 results): No results for input(s): PROBNP in the last 8760 hours.  Cardiac Enzymes: No results for input(s): CKTOTAL, CKMB, CKMBINDEX, TROPONINI in the last 168 hours.  Coagulation Profile: Recent Labs  Lab 03/07/19 1312  INR 1.2    Liver Function Tests: Recent Labs  Lab 03/07/19 1312 03/08/19 0210 03/08/19 0427 03/09/19 0207  AST 129* 136* 130* 108*  ALT 48* 53* 50* 42  ALKPHOS 209* 224* 219* 187*  BILITOT 1.6* 2.1* 2.1* 1.9*  PROT 6.6 7.2 7.2 6.6  ALBUMIN 3.5 3.9 3.8 3.3*   Recent Labs  Lab 03/07/19 1312  LIPASE 45   Recent Labs  Lab 03/08/19 1008 03/09/19 0711  AMMONIA 69* 56*    Basic Metabolic Panel: Recent Labs  Lab 03/07/19 1312 03/08/19 0210 03/08/19 0427 03/09/19 0207  NA 139 137 137 136  K 3.9 4.8 4.4 3.9  CL 105 102 101 100  CO2 23 22 25 26   GLUCOSE 107* 116* 116* 104*  BUN 6 9 10 10   CREATININE 0.64 0.94 1.04 0.77  CALCIUM 8.2* 7.9* 7.8* 7.7*  MG  --   --   --  1.8   GFR: Estimated Creatinine Clearance: 110.7 mL/min (by C-G formula based on SCr of 0.77 mg/dL).  CBC: Recent Labs  Lab 03/07/19 1312 03/08/19 0427 03/09/19 0207  WBC 9.8 11.7* 8.0  NEUTROABS 6.7  --   --   HGB 12.4* 12.7* 11.7*  HCT 37.2* 38.4* 35.0*  MCV 104.8* 110.0* 109.0*  PLT 242 258 211    Procedures:  03/08/2019-paracentesis with removal of 1.8 L.  Microbiology summarized: [id number] negative.  Assessment & Plan: Abdominal pain/distention:  due to ascites/cirrhosis likely from alcohol.  Cannot exclude SBP.  CT did not show SBO.  Distention and pain improved.  He is passing gases but no BM yet.  Slight tenderness on exam.  -Paracentesis as above.  Fluid study does not suggest SBP.  Follow fluid culture -Repeat large-volume paracentesis per GI today. -Continue prophylactic ceftriaxone -Continue clear liquid  diet -Increase Lasix and spironolactone per GI. -Start lactulose  Elevated liver enzymes/ammonia/ALP/T bili: Likely from possible liver cirrhosis. -Ultrasound concerning for liver cirrhosis.  CT abdomen shows hepatic steatosis and significant ascites. -Acute hepatitis panel and HIV negative. -Start lactulose which could help with ammonia and possible constipation. -Follow autoimmune labs alpha-1 antitrypsin, and AFP ordered by GI. -Appreciate GI input  Concern about portal vein thrombosis on ultrasound: Was not seen on CT abdomen and pelvis. -Discussed with IR, Dr. Pascal Lux who recommended disregarding the ultrasound reading given reassuring CT  Rectal bleed: Reportedly  normal colonoscopy was 7 years ago.  Has not had further bleeding in 2 days although he has not had bowel movement.  H&H stable. -Continue Protonix -Continue clear liquid diet pending GI evaluation.  Alcohol use disorder -Continue CIWA protocol -Thiamine and multivitamins  Psoriatic arthritis: Recently switched from Enbrel to Cosentyx. -Discontinue Cosentyx for now.  HTN: BP slightly elevated. -Continue Avapro -PRN hydralazine  History of anxiety -Continue home BuSpar  Prediabetes:: On metformin at home.  CBG was in fair range. -SSI and sliding scale insulin.  DVT prophylaxis: SCD Code Status: Full code Family Communication: Patient and/or RN. Available if any question.  Disposition Plan: Remains inpatient for further evaluation and treatment Consultants: Gastroenterology   Antimicrobials: Anti-infectives (From admission, onward)   Start     Dose/Rate Route Frequency Ordered Stop   03/07/19 2115  cefTRIAXone (ROCEPHIN) 1 g in sodium chloride 0.9 % 100 mL IVPB     1 g 200 mL/hr over 30 Minutes Intravenous Every 24 hours 03/07/19 2108        Sch Meds:  Scheduled Meds: . azelastine  1 spray Each Nare BID  . busPIRone  15 mg Oral BID  . cetirizine  10 mg Oral QPM  . Chlorhexidine Gluconate Cloth  6  each Topical Daily  . folic acid  1 mg Oral Daily  . [START ON 03/10/2019] furosemide  40 mg Oral Daily  . insulin aspart  0-9 Units Subcutaneous Q4H  . irbesartan  37.5 mg Oral Daily  . lactulose  20 g Oral BID  . LORazepam  0-4 mg Intravenous Q6H   Followed by  . [START ON 03/10/2019] LORazepam  0-4 mg Intravenous Q12H  .  morphine injection  4 mg Intravenous Once  . multivitamin with minerals  1 tablet Oral Daily  . sodium chloride flush  3 mL Intravenous Q12H  . [START ON 03/10/2019] spironolactone  100 mg Oral Daily  . thiamine  100 mg Oral Daily   Or  . thiamine  100 mg Intravenous Daily   Continuous Infusions: . sodium chloride 10 mL/hr at 03/08/19 0600  . sodium chloride    . albumin human    . cefTRIAXone (ROCEPHIN)  IV Stopped (03/08/19 2203)   PRN Meds:.sodium chloride, sodium chloride, clonazePAM, fluticasone, HYDROmorphone (DILAUDID) injection, LORazepam **OR** LORazepam, ondansetron (ZOFRAN) IV, sodium chloride flush, zolpidem  Taye T[facility]  If 7PM-7AM, please contact night-coverage [email or web address] Password [email or web address] 03/09/2019, 2:09 PM

## 2019-03-09 NOTE — Discharge Summary (Signed)
Physician Discharge Summary  Andrew Patton YNW:[id number] DOB: [birth year] DOA: 03/07/2019  PCP: [name], Walter, MD  Admit date: 03/07/2019 Discharge date: 03/09/2019  Admitted From: Home Disposition: Home  Recommendations for Outpatient Follow-up:  1. Follow up with PCP in 1 week. 2. Patient has follow-up with digestive health on 03/14/2019 and [facility] on 03/22/2019. 3. Please obtain CBC, CMP and mag at follow-up. 4. Please follow up on the following pending results: Autoimmune labs for liver disease, AFP, alpha-1 antitrypsin assay, and liver Doppler pending.  Home Health: None Equipment/Devices: None  Discharge Condition: Stable CODE STATUS: Full code  Hospital Course: 58 y.o.male with history of arthritis, HTN, HLD and alcohol use disorder presenting with multiple GI symptoms including abdominal pain, distention, nausea, rectal bleed and loss of appetite for months. Recently referred to GI by PCP but symptoms gotten worse and he presented to ED.  In ED, HDS.  Hgb 12.4 with MCV of 105.  Mildly elevated ALP/AST/ALT/T bili.  PT/INR within normal range.  FOBT negative.  EKG NSR.  Abdominal ultrasound concerning for cirrhosis, moderate ascites, mild diffuse gallbladder wall thickening and possible portal vein thrombosis.  CT abdomen and pelvis revealed hepatomegaly with inhomogeneous hepatic cirrhosis and extensive ascites but no portal vein thrombosis.  GI, Dr. Loreta AveMann consulted and [facility].  Ultrasound finding of partial portal vein thrombosis discussed with IR, Dr. Grace IsaacWatts who suggested disregarding ultrasound finding given patent portal vein on CT abdomen.  Patient had paracentesis with removal of 1.8 L on 03/08/2019.  Fluid study and culture did not suggest SBP.  Ceftriaxone discontinued.  Started on Lasix and spironolactone.  Evaluated by GI on 03/09/2019 who recommended repeat paracentesis.  However, repeat abdominal ultrasound revealed very small ascites to drain.  HIV  and acute hepatitis panels were negative.  Ammonia level elevated to 69 and improved 56.  Started on lactulose on the day of discharge.  To patient requested discharge in the afternoon to follow-up with GI outpatient.  Liver Doppler, hepatic autoimmune labs, AFP and alpha-1 antitrypsin assay which are pending at time of discharge.  GI recommended EGD for EV screening either inpatient or outpatient.   Patient has upcoming appointment with digestive health on 03/14/2019 and [facility] on 03/22/2019.  See individual problem list below for more.  Discharge Diagnoses:  Abdominal pain/distention:  due to ascites/cirrhosis likely from alcohol.  Cannot exclude SBP.  CT did not show SBO.  Distention and pain improved.  He is passing gases but no BM yet.  Slight tenderness on exam.  -Paracentesis as above.  Fluid study and culture does not suggest SBP.  -Repeat large-volume paracentesis ordered by GI but no drainable fluid on ultrasound per IR. -Increased Lasix and spironolactone to 40 mg and 100 mg daily per GI recommendation -Started lactulose -Patient requested discharge.  He has upcoming appointment with GI -Needs EGD for EV screening either inpatient or outpatient per GI. -Follow-up with PCP in 1 week for CMP and magnesium.   Elevated liver enzymes/ammonia/ALP/T bili: Likely from possible liver cirrhosis. -Ultrasound concerning for liver cirrhosis.  CT abdomen shows hepatic steatosis and significant ascites. -Acute hepatitis panel and HIV negative. -Start lactulose which could help with ammonia and possible constipation. -Follow autoimmune labs alpha-1 antitrypsin, and AFP ordered by GI. -Appreciate GI input -Outpatient GI follow-up  Concern about portal vein thrombosis on ultrasound: Was not seen on CT abdomen and pelvis. -Discussed with IR, Dr. Grace IsaacWatts who recommended disregarding the ultrasound reading given reassuring CT -Liver Doppler  ordered by GI and pending at time of discharge.  Rectal  bleed: Reportedly normal colonoscopy was 7 years ago.  Has not had further bleeding in 2 days although he has not had bowel movement.  H&H stable. -Continue Protonix  Alcohol use disorder -Encourage cessation. -Thiamine and multivitamins  Psoriatic arthritis: Recently switched from Enbrel to Cosentyx. -Continue outpatient treatment  HTN: BP slightly elevated. -Discontinued home BP meds to allow room for diuretics.  History of anxiety -Continue home BuSpar  Prediabetes:: On metformin at home.  CBG was in fair range. -Discharged on home meds.  Discharge Instructions  Discharge Instructions    Diet - low sodium heart healthy   Complete by: As directed    Discharge instructions   Complete by: As directed    It has been a pleasure taking care of you! You were admitted with abdominal pain/distention/nausea.  You tests revealed ascites (fluid in your abdomen) due to liver disease likely due to alcohol use.  You were treated with this with paracentesis and fluid medications.  We are discharging you on more fluid medications to prevent recurrence of ascites.  We have also started you on lactulose to reduce your risk of hepatic encephalopathy (mental status changes daily buildup of ammonia) which is common in people with liver disease.  Please review your new medication list and the directions before you take your medications. Follow-up with your primary care doctor in 1 week for blood work. Go to your appointments with gastroenterology  Take care,   Increase activity slowly   Complete by: As directed      Allergies as of 03/09/2019      Reactions   Meloxicam Other (See Comments)   Leg pain   Penicillins Rash   Has patient had a PCN reaction causing immediate rash, facial/tongue/throat swelling, SOB or lightheadedness with hypotension: No Has patient had a PCN reaction causing severe rash involving mucus membranes or skin necrosis: No Has patient had a PCN reaction that required  hospitalization: No Has patient had a PCN reaction occurring within the last 10 years: No If all of the above answers are "NO", then may proceed with Cephalosporin use.      Medication List    STOP taking these medications   aspirin 81 MG tablet   doxycycline 100 MG capsule Commonly known as: VIBRAMYCIN   olmesartan 40 MG tablet Commonly known as: BENICAR     TAKE these medications   azelastine 0.1 % nasal spray Commonly known as: ASTELIN Place 1 spray into both nostrils 2 (two) times daily. Use in each nostril as directed   busPIRone 15 MG tablet Commonly known as: BUSPAR Take 15 mg by mouth 2 (two) times daily.   clonazePAM 0.5 MG tablet Commonly known as: KLONOPIN Take 0.5 mg by mouth daily as needed for anxiety.   colesevelam 625 MG tablet Commonly known as: WELCHOL Take 1,250 mg by mouth daily.   Cosentyx 150 MG/ML Sosy Generic drug: Secukinumab Inject 150 mg into the skin every 30 (thirty) days.   ezetimibe 10 MG tablet Commonly known as: ZETIA Take 10 mg by mouth daily.   fluticasone 50 MCG/ACT nasal spray Commonly known as: FLONASE Place 1 spray into both nostrils daily as needed for allergies.   furosemide 40 MG tablet Commonly known as: LASIX Take 1 tablet (40 mg total) by mouth daily. Start taking on: March 10, 2019   Krill Oil 500 MG Caps Take 500 mg by mouth daily.   lactulose 10 GM/15ML  solution Commonly known as: CHRONULAC Take 30 meal (20 g) 2-3 times a day for goal of 2-3 bowel movements a day.   levocetirizine 5 MG tablet Commonly known as: XYZAL Take 5 mg by mouth every evening.   metFORMIN 500 MG tablet Commonly known as: GLUCOPHAGE Take 500 mg by mouth every evening.   pantoprazole 40 MG tablet Commonly known as: PROTONIX Take 40 mg by mouth daily.   spironolactone 100 MG tablet Commonly known as: ALDACTONE Take 1 tablet (100 mg total) by mouth daily. Start taking on: March 10, 2019   thiamine 100 MG tablet Take 1 tablet  (100 mg total) by mouth daily. Start taking on: March 10, 2019   Vitamin D3 25 MCG (1000 UT) Caps Take 1 capsule by mouth daily.   vitamin E 1000 UNIT capsule Take 1,000 Units by mouth daily.   zolpidem 12.5 MG CR tablet Commonly known as: AMBIEN CR Take 12.5 mg by mouth at bedtime as needed for sleep.      Follow-up Information    [name], MD. Schedule an appointment as soon as possible for a visit in 1 week(s).   Specialty: Internal Medicine Contact information: [address] [place] [address] [phone]           Consultations:  Gastroenterology  IR  Procedures/Studies:  2D Echo: None  Ct Abdomen Pelvis W Contrast  Result Date: 03/07/2019 CLINICAL DATA:  Acute abdominal pain. EXAM: CT ABDOMEN AND PELVIS WITH CONTRAST TECHNIQUE: Multidetector CT imaging of the abdomen and pelvis was performed using the standard protocol following bolus administration of intravenous contrast. CONTRAST:  OMNIPAQUE IOHEXOL 300 MG/ML  SOLN COMPARISON:  None. FINDINGS: Lower chest: Normal. Hepatobiliary: Hepatomegaly with inhomogeneous hepatic steatosis. Biliary tree appears normal. Pancreas: Unremarkable. No pancreatic ductal dilatation or surrounding inflammatory changes. Spleen: Normal in size without focal abnormality. Adrenals/Urinary Tract: Adrenal glands are unremarkable. Kidneys are normal, without renal calculi, focal lesion, or hydronephrosis. Bladder is unremarkable. Stomach/Bowel: Stomach is within normal limits. Appendix has been removed. No evidence of bowel wall thickening, distention, or inflammatory changes. Vascular/Lymphatic: Aortic atherosclerosis. No enlarged abdominal or pelvic lymph nodes. Reproductive: Prostate is unremarkable. Other: Extensive ascites. No hernias. Musculoskeletal: No acute or significant osseous findings. IMPRESSION: 1. Hepatomegaly with inhomogeneous hepatic steatosis. 2. Extensive ascites. Aortic Atherosclerosis  (ICD10-I70.0). Electronically Signed   By: Francene Boyers M.D.   On: 03/07/2019 15:57   US Paracentesis  Result Date: 03/08/2019 INDICATION: Patient with abdominal distention, ascites. Request is made for diagnostic and therapeutic paracentesis. EXAM: ULTRASOUND GUIDED DIAGNOSTIC AND THERAPEUTIC PARACENTESIS MEDICATIONS: 10 mL 1% lidocaine COMPLICATIONS: None immediate. PROCEDURE: Informed written consent was obtained from the patient after a discussion of the risks, benefits and alternatives to treatment. A timeout was performed prior to the initiation of the procedure. Initial ultrasound scanning demonstrates a small amount of ascites within the right lower abdominal quadrant. The right lower abdomen was prepped and draped in the usual sterile fashion. 1% lidocaine was used for local anesthesia. Following this, a 19 gauge, 7-cm, Yueh catheter was introduced. An ultrasound image was saved for documentation purposes. The paracentesis was performed. The catheter was removed and a dressing was applied. The patient tolerated the procedure well without immediate post procedural complication. FINDINGS: A total of approximately 1.8 liters of in fluid was removed. Samples were sent to the laboratory as requested by the clinical team. IMPRESSION: Successful ultrasound-guided diagnostic and therapeutic paracentesis yielding 1.8 liters of peritoneal fluid. Read by: Loyce Dys PA-C  Electronically Signed   By: Simonne ComeJohn  Watts M.D.   On: 03/08/2019 16:14   Dg Abd Portable 1v  Result Date: 03/08/2019 CLINICAL DATA:  Possible obstruction. EXAM: PORTABLE ABDOMEN - 1 VIEW COMPARISON:  CT scan March 07, 2019.  X-ray March 07, 2019. FINDINGS: Contrast is seen in the bladder and the colon. Contrast outlines the wall of air-filled transverse colon small bowel loops are nondilated. No evidence of obstruction or significant ileus. No other abnormalities. IMPRESSION: No obstruction identified. Electronically Signed   By: Gerome Samavid   Williams III M.D   On: 03/08/2019 13:42   Dg Abd Portable 1 View  Result Date: 03/07/2019 CLINICAL DATA:  Abdominal pain EXAM: PORTABLE ABDOMEN - 1 VIEW COMPARISON:  None. FINDINGS: The bowel gas pattern is normal. No radio-opaque calculi or other significant radiographic abnormality are seen. IMPRESSION: Negative. Electronically Signed   By: Deatra RobinsonKevin  Herman M.D.   On: 03/07/2019 21:00   [place] Abdomen Limited Ruq  Result Date: 03/07/2019 CLINICAL DATA:  Abdominal pain for the past 2 months. EXAM: ULTRASOUND ABDOMEN LIMITED RIGHT UPPER QUADRANT COMPARISON:  Item ultrasound report dated 12/25/2010. FINDINGS: Gallbladder: Minimal diffuse wall thickening measuring 3.2 mm in maximum thickness. No gallstones or sonographic Murphy sign. There is a small amount of pericholecystic fluid. Common bile duct: Diameter: 6.1 mm Liver: Diffusely echogenic, mildly heterogeneous and possibly enlarged with minimal contour irregularity. No mass is seen. The main portal vein is somewhat small with a suggestion of partial thrombosis or thrombosis with cavernous transformation. Other: Moderate amount of ascites. IMPRESSION: 1. Possible changes of cirrhosis of the liver with and associated moderate amount of ascites and small amount of pericholecystic fluid. 2. Mild diffuse gallbladder wall thickening, most likely due to hypoproteinemia. 3. Possible partial thrombosis of the portal vein or complete thrombosis with cavernous transformation. Electronically Signed   By: Beckie SaltsSteven  Reid M.D.   On: 03/07/2019 14:52      Subjective: Had paracentesis with removal of 1.8 L yesterday.  Fluid study does not suggest SBP.  No major events overnight of this morning.  He says he has not been able to sleep but abdominal pain and distention improved.  He denies chest pain or dyspnea.  Nausea resolved.  Tolerated solid food last night.  He is on clears this morning.  Patient was evaluated by GI this morning who recommended repeat paracentesis.  Also  ordered liver Doppler.  However, repeat abdominal ultrasound without significant ascites to drain.  Liver Doppler pending.  In the afternoon, patient asked to go home since he does not need paracentesis.  He says he has a GI follow-up.  Discharged home to follow-up with PCP for 1 week for blood work, and GI as scheduled.   Discharge Exam: Vitals:   03/09/19 1321 03/09/19 1603  BP: 130/84 (!) 141/86  Pulse: 80 80  Resp:  15  Temp:  98.6 F (37 C)  SpO2: 96% 99%    GENERAL: No acute distress.  Appears well.  HEENT: MMM.  Vision and hearing grossly intact.  NECK: Supple.  No JVD.  LUNGS:  No IWOB. Good air movement bilaterally. HEART:  RRR. Heart sounds normal.  ABD: Bowel sounds present.  Some distention.  Mild tenderness to palpation. MSK/EXT:  Moves all extremities. No apparent deformity. No edema bilaterally. SKIN: no apparent skin lesion or wound NEURO: Awake, alert and oriented appropriately.  No gross deficit.  PSYCH: Calm. Normal affect.    The results of significant diagnostics from this hospitalization (including imaging, microbiology, ancillary  and laboratory) are listed below for reference.     Microbiology: Recent Results (from the past 240 hour(s))  SARS Coronavirus 2 [facility](Hospital order, Performed in [facility] hospital lab) Nasopharyngeal Nasopharyngeal Swab     Status: None   Collection Time: 03/07/19  5:28 PM   Specimen: Nasopharyngeal Swab  Result Value Ref Range Status   SARS Coronavirus 2 NEGATIVE NEGATIVE Final    Comment: (NOTE) If result is NEGATIVE SARS-CoV-2 target nucleic acids are NOT DETECTED. The SARS-CoV-2 RNA is generally detectable in upper and lower  respiratory specimens during the acute phase of infection. The lowest  concentration of SARS-CoV-2 viral copies this assay can detect is 250  copies / mL. A negative result does not preclude SARS-CoV-2 infection  and should not be used as the sole basis for treatment or other  patient management  decisions.  A negative result may occur with  improper specimen collection / handling, submission of specimen other  than nasopharyngeal swab, presence of viral mutation(s) within the  areas targeted by this assay, and inadequate number of viral copies  (<250 copies / mL). A negative result must be combined with clinical  observations, patient history, and epidemiological information. If result is POSITIVE SARS-CoV-2 target nucleic acids are DETECTED. The SARS-CoV-2 RNA is generally detectable in upper and lower  respiratory specimens dur ing the acute phase of infection.  Positive  results are indicative of active infection with SARS-CoV-2.  Clinical  correlation with patient history and other diagnostic information is  necessary to determine patient infection status.  Positive results do  not rule out bacterial infection or co-infection with other viruses. If result is PRESUMPTIVE POSTIVE SARS-CoV-2 nucleic acids MAY BE PRESENT.   A presumptive positive result was obtained on the submitted specimen  and confirmed on repeat testing.  While 2019 novel coronavirus  (SARS-CoV-2) nucleic acids may be present in the submitted sample  additional confirmatory testing may be necessary for epidemiological  and / or clinical management purposes  to differentiate between  SARS-CoV-2 and other Sarbecovirus currently known to infect humans.  If clinically indicated additional testing with an alternate test  methodology [phone] is advised. The SARS-CoV-2 RNA is generally  detectable in upper and lower respiratory sp ecimens during the acute  phase of infection. The expected result is Negative. Fact Sheet for Patients:  BoilerBrush.com.cyhttps://www.fda.gov/media/136312/download Fact Sheet for Healthcare Providers: https://pope.com/https://www.fda.gov/media/136313/download This test is not yet approved or cleared by the [place] FDA and has been authorized for detection and/or diagnosis of SARS-CoV-2 by FDA under an  Emergency Use Authorization (EUA).  This EUA will remain in effect (meaning this test can be used) for the duration of the COVID-19 declaration under Section 564(b)(1) of the Act, 21 U.S.C. section 360bbb-3(b)(1), unless the authorization is terminated or revoked sooner. Performed at [facility], [address] [place], [place] [address]   MRSA PCR Screening     Status: None   Collection Time: 03/08/19  6:30 AM   Specimen: Nasal Mucosa; Nasopharyngeal  Result Value Ref Range Status   MRSA by PCR NEGATIVE NEGATIVE Final    Comment:        The GeneXpert MRSA Assay (FDA approved for NASAL specimens only), is one component of a comprehensive MRSA colonization surveillance program. It is not intended to diagnose MRSA infection nor to guide or monitor treatment for MRSA infections. Performed at [facility], [address] [place], [place] [address]   Body fluid culture  Status: None (Preliminary result)   Collection Time: 03/08/19 12:23 PM   Specimen: Peritoneal Fluid  Result Value Ref Range Status   Specimen Description   Final    PERITONEAL Performed at [facility], [address] [place], [place] [address]    Special Requests   Final    NONE Performed at [facility], [address], [place], [place] [address]    Gram Stain   Final    WBC PRESENT, PREDOMINANTLY MONONUCLEAR NO ORGANISMS SEEN CYTOSPIN SMEAR    Culture   Final    NO GROWTH < 24 HOURS Performed at [facility], [address], [place], [place] [address]    Report Status PENDING  Incomplete     Labs: BNP (last 3 results) No results for input(s): BNP in the last 8760 hours. Basic Metabolic Panel: Recent Labs  Lab 03/07/19 1312 03/08/19 0210 03/08/19 0427 03/09/19 0207  NA 139 137 137 136  K 3.9 4.8 4.4 3.9  CL 105 102 101 100  CO2 23 22 25 26   GLUCOSE 107* 116* 116* 104*  BUN 6 9 10 10   CREATININE 0.64 0.94 1.04  0.77  CALCIUM 8.2* 7.9* 7.8* 7.7*  MG  --   --   --  1.8   Liver Function Tests: Recent Labs  Lab 03/07/19 1312 03/08/19 0210 03/08/19 0427 03/09/19 0207  AST 129* 136* 130* 108*  ALT 48* 53* 50* 42  ALKPHOS 209* 224* 219* 187*  BILITOT 1.6* 2.1* 2.1* 1.9*  PROT 6.6 7.2 7.2 6.6  ALBUMIN 3.5 3.9 3.8 3.3*   Recent Labs  Lab 03/07/19 1312  LIPASE 45   Recent Labs  Lab 03/08/19 1008 03/09/19 0711  AMMONIA 69* 56*   CBC: Recent Labs  Lab 03/07/19 1312 03/08/19 0427 03/09/19 0207  WBC 9.8 11.7* 8.0  NEUTROABS 6.7  --   --   HGB 12.4* 12.7* 11.7*  HCT 37.2* 38.4* 35.0*  MCV 104.8* 110.0* 109.0*  PLT 242 258 211   Cardiac Enzymes: No results for input(s): CKTOTAL, CKMB, CKMBINDEX, TROPONINI in the last 168 hours. BNP: Invalid input(s): POCBNP CBG: Recent Labs  Lab 03/08/19 2304 03/09/19 0307 03/09/19 0731 03/09/19 1202 03/09/19 1637  GLUCAP 114* 108* 109* 108* 107*   D-Dimer No results for input(s): DDIMER in the last 72 hours. Hgb A1c No results for input(s): HGBA1C in the last 72 hours. Lipid Profile No results for input(s): CHOL, HDL, LDLCALC, TRIG, CHOLHDL, LDLDIRECT in the last 72 hours. Thyroid function studies No results for input(s): TSH, T4TOTAL, T3FREE, THYROIDAB in the last 72 hours.  Invalid input(s): FREET3 Anemia work up No results for input(s): VITAMINB12, FOLATE, FERRITIN, TIBC, IRON, RETICCTPCT in the last 72 hours. Urinalysis    Component Value Date/Time   COLORURINE ORANGE (A) 03/07/2019 1630   APPEARANCEUR CLEAR 03/07/2019 1630   LABSPEC 1.020 03/07/2019 1630   PHURINE 5.5 03/07/2019 1630   GLUCOSEU 100 (A) 03/07/2019 1630   HGBUR NEGATIVE 03/07/2019 1630   BILIRUBINUR MODERATE (A) 03/07/2019 1630   KETONESUR 15 (A) 03/07/2019 1630   PROTEINUR 30 (A) 03/07/2019 1630   NITRITE POSITIVE (A) 03/07/2019 1630   LEUKOCYTESUR NEGATIVE 03/07/2019 1630   Sepsis Labs Invalid input(s): PROCALCITONIN,  WBC,  LACTICIDVEN   Time  coordinating discharge: 40 minutes  SIGNED:  Almon Hercules, MD  [facility] 03/09/2019, 4:44 PM  If 7PM-7AM, please contact night-coverage [email or web address] Password [email or web address]

## 2019-03-10 LAB — CERULOPLASMIN: Ceruloplasmin: 25 mg/dL (ref 16.0–31.0)

## 2019-03-10 LAB — MITOCHONDRIAL ANTIBODIES: Mitochondrial M2 Ab, IgG: <20 Units (ref 0.0–20.0)

## 2019-03-10 LAB — AFP TUMOR MARKER: AFP, Serum, Tumor Marker: 3.6 ng/mL (ref 0.0–8.3)

## 2019-03-10 LAB — ANTI-SMOOTH MUSCLE ANTIBODY, IGG: F-Actin IgG: 4 Units (ref 0–19)

## 2019-03-11 LAB — BODY FLUID CULTURE: Culture: NO GROWTH

## 2019-03-12 LAB — ALPHA-1 ANTITRYPSIN PHENOTYPE: A-1 Antitrypsin, Ser: 192 mg/dL — ABNORMAL HIGH (ref 101–187)

## 2019-03-12 LAB — ANTINUCLEAR ANTIBODIES, IFA: ANA Ab, IFA: NEGATIVE

## 2019-03-15 ENCOUNTER — Encounter: Payer: Self-pay | Admitting: Emergency Medicine

## 2019-03-16 DIAGNOSIS — D649 Anemia, unspecified: Secondary | ICD-10-CM | POA: Diagnosis not present

## 2019-03-16 DIAGNOSIS — R748 Abnormal levels of other serum enzymes: Secondary | ICD-10-CM | POA: Diagnosis not present

## 2019-03-21 ENCOUNTER — Telehealth: Payer: Self-pay | Admitting: Emergency Medicine

## 2019-03-21 NOTE — Telephone Encounter (Signed)
Okay to modify diuretics as outlined.  I did suggest taking the Lasix first, then Aldactone 4 hours later, as the Lasix will likely wear off well before bedtime by doing it this way.  As for the bloating, can trial some OTC medications like Gas-X or Beano to see if that provides relief.  Additionally, can trial a low FODMAP diet (please send him a copy).  If no improvement by the time of his clinic appointment, we can discuss alternate treatment strategies and additional evaluation modalities.  Thanks.

## 2019-03-21 NOTE — Telephone Encounter (Signed)
Called and spoke with patient-patient reports he is having "an issue" with the fluid pills-patient is requesting to take his fluid pill during the day and not at night (advised patient to take meds at least 4 hrs apart-Lasix and Aldactone if he was going to take them during the day)- keeping the pateint up at night -patient reports his is still having the "bloating" in the abdomen area-(patient is not gaining weight-taking weight daily); slowly loosing weight since coming home from hospital so "I know I am not gaining weight because of not retaining fluids but still having the bloated feeling"; Please advise on bloating relief measures

## 2019-03-21 NOTE — Progress Notes (Deleted)
Referring Provider: Deland Pretty, MD Primary Care Physician:  Deland Pretty, MD  Reason for Consultation: Bloating and heartburn   IMPRESSION:  ***  PLAN: ***  Please see the "Patient Instructions" section for addition details about the plan.  HPI: Andrew Patton is a 58 y.o. male referred by Dr. Shelia Media for further evaluation of bloating and heartburn.  He is a self-employed Probation officer.  The history is obtained through the patient and review of his referral records.  He has a history of hypothyroidism, insomnia, allergic rhinitis, hypertension, multinodular goiter, anxiety, and diabetes with hypertension and hypercholesterolemia.  He has a history of elevated transaminases attributed to fatty liver.  He had a normal esophagram in 2010.  He has a history of reflux.  Dr. Concha Pyo recently reduced his pantoprazole to 40 mg daily.  He is reported abdominal bloating.  No change with lactose-free diet.  He has a history of abnormal liver enzymes previously evaluated by Dr. Earlean Shawl and [facility].  Labs 02/15/2019 show a total bilirubin 1.3, direct bilirubin 0.6, AST 135, ALT 86, alkaline phosphatase 230.  Referral records show a negative ANA, negative hepatitis panel and negative screening for hemochromatosis.  Referral records mention ultrasounds in 2010 and 2013.  Occasionally drinks alcohol.  He had a screening colonoscopy with Dr. Sharlett Iles 02/07/2013 that was normal.  Repeat colonoscopy recommended in 10 years.  No known family history of colon cancer or polyps. No family history of uterine/endometrial cancer, pancreatic cancer or gastric/stomach cancer.  Prior endoscopy: Screening colonoscopy 02/07/2013 with Dr. Sharlett Iles: Normal  Recent laboratory evaluation: Labs 02/12/2019: Total bilirubin 1.0, AST 161, ALT 97, alk phos 240, sodium 142, creatinine 0.9, albumin 3.8 Labs 02/15/2019: Total bilirubin 1.3, direct bilirubin 0.6, AST 135, ALT 86, albumin 3.7,  total protein 7.0, alk phos 230, white count 6.5, hemoglobin 12.4, platelets 288, MCV 104.6, RDW 14.8, sodium  Past Medical History:  Diagnosis Date  . Allergy   . Anxiety   . Arthritis   . Diabetes ([place])   . Elevated transaminase level   . Fatigue   . Fatty liver   . Hyperlipidemia   . Hypertension   . Hypothyroidism   . Inguinal muscle strain   . Insomnia   . Left knee pain   . Multinodular goiter   . Obesity   . Psoriasis   . Tinnitus     Past Surgical History:  Procedure Laterality Date  . APPENDECTOMY    . FOOT SURGERY    . NOSE SURGERY    . ROTATOR CUFF REPAIR     left    Current Outpatient Medications  Medication Sig Dispense Refill  . azelastine (ASTELIN) 0.1 % nasal spray Place 1 spray into both nostrils 2 (two) times daily. Use in each nostril as directed    . busPIRone (BUSPAR) 15 MG tablet Take 15 mg by mouth 2 (two) times daily.     . Cholecalciferol (VITAMIN D3) 1000 UNITS CAPS Take 1 capsule by mouth daily.     . clonazePAM (KLONOPIN) 0.5 MG tablet Take 0.5 mg by mouth daily as needed for anxiety.     . colesevelam (WELCHOL) 625 MG tablet Take 1,250 mg by mouth daily.    Marland Kitchen ezetimibe (ZETIA) 10 MG tablet Take 10 mg by mouth daily.    . fluticasone (FLONASE) 50 MCG/ACT nasal spray Place 1 spray into both nostrils daily as needed for allergies.     . furosemide (LASIX) 40 MG tablet Take 1  tablet (40 mg total) by mouth daily. 90 tablet 0  . Krill Oil 500 MG CAPS Take 500 mg by mouth daily.    Marland Kitchen lactulose (CHRONULAC) 10 GM/15ML solution Take 30 meal (20 g) 2-3 times a day for goal of 2-3 bowel movements a day. 946 mL 0  . levocetirizine (XYZAL) 5 MG tablet Take 5 mg by mouth every evening.    . metFORMIN (GLUCOPHAGE) 500 MG tablet Take 500 mg by mouth every evening.    . pantoprazole (PROTONIX) 40 MG tablet Take 40 mg by mouth daily.    . Secukinumab (COSENTYX) 150 MG/ML SOSY Inject 150 mg into the skin every 30 (thirty) days.    Marland Kitchen spironolactone (ALDACTONE)  100 MG tablet Take 1 tablet (100 mg total) by mouth daily. 90 tablet 0  . thiamine 100 MG tablet Take 1 tablet (100 mg total) by mouth daily. 90 tablet 0  . vitamin E 1000 UNIT capsule Take 1,000 Units by mouth daily.    Marland Kitchen zolpidem (AMBIEN CR) 12.5 MG CR tablet Take 12.5 mg by mouth at bedtime as needed for sleep.     No current facility-administered medications for this visit.     Allergies as of 03/22/2019 - Review Complete 03/08/2019  Allergen Reaction Noted  . Crestor [rosuvastatin calcium]  03/15/2019  . Meloxicam Other (See Comments) 03/08/2019  . Penicillins Rash 01/24/2013    Family History  Problem Relation Age of Onset  . Breast cancer Mother   . Hyperlipidemia Mother   . Bladder Cancer Father   . Parkinson's disease Father   . Birth defects Maternal Grandfather   . Birth defects Paternal Grandmother   . Colon cancer Neg Hx   . Esophageal cancer Neg Hx   . Rectal cancer Neg Hx   . Stomach cancer Neg Hx     Social History   Socioeconomic History  . Marital status: Married    Spouse name: Not on file  . Number of children: Not on file  . Years of education: Not on file  . Highest education level: Not on file  Occupational History  . Not on file  Social Needs  . Financial resource strain: Not on file  . Food insecurity    Worry: Not on file    Inability: Not on file  . Transportation needs    Medical: Not on file    Non-medical: Not on file  Tobacco Use  . Smoking status: Former Research scientist (life sciences)  . Smokeless tobacco: Never Used  Substance and Sexual Activity  . Alcohol use: Yes    Alcohol/week: 2.0 standard drinks    Types: 2 Glasses of wine per week    Comment: daily  . Drug use: No  . Sexual activity: Not on file  Lifestyle  . Physical activity    Days per week: Not on file    Minutes per session: Not on file  . Stress: Not on file  Relationships  . Social Herbalist on phone: Not on file    Gets together: Not on file    Attends religious  service: Not on file    Active member of club or organization: Not on file    Attends meetings of clubs or organizations: Not on file    Relationship status: Not on file  . Intimate partner violence    Fear of current or ex partner: Not on file    Emotionally abused: Not on file    Physically abused: Not on  file    Forced sexual activity: Not on file  Other Topics Concern  . Not on file  Social History Narrative  . Not on file    Review of Systems: 12 system ROS is negative except as noted above.   Physical Exam: General:   Alert,  well-nourished, pleasant and cooperative in NAD Head:  Normocephalic and atraumatic. Eyes:  Sclera clear, no icterus.   Conjunctiva pink. Ears:  Normal auditory acuity. Nose:  No deformity, discharge,  or lesions. Mouth:  No deformity or lesions.   Neck:  Supple; no masses or thyromegaly. Lungs:  Clear throughout to auscultation.   No wheezes. Heart:  Regular rate and rhythm; no murmurs. Abdomen:  Soft,nontender, nondistended, normal bowel sounds, no rebound or guarding. No hepatosplenomegaly.   Rectal:  Deferred  Msk:  Symmetrical. No boney deformities LAD: No inguinal or umbilical LAD Extremities:  No clubbing or edema. Neurologic:  Alert and  oriented x4;  grossly nonfocal Skin:  Intact without significant lesions or rashes. Psych:  Alert and cooperative. Normal mood and affect.    Hiral Lukasiewicz L. Tarri Glenn, MD, MPH 03/21/2019, 9:06 AM

## 2019-03-21 NOTE — Telephone Encounter (Signed)
Called and spoke with patient-patient informed of  MD recommendations; patient is agreeable with plan of care and is requesting this information be sent to his MyChart; this information has been sent via MyChart to the patient; verified PCP; result note routed to PCP per MD recommendations; Patient verbalized understanding of information/instructions;  Patient was advised to call the office at [phone] if questions/concerns arise;

## 2019-03-21 NOTE — Telephone Encounter (Signed)
Patient was seen in hospital by Mosetta Anis, PA and Dr. Bryan Lemma. He was scheduled with Dr. Tarri Glenn before going into the hospital. His appointment has been rescheduled to Dr Bryan Lemma since he saw him in the hospital. Patient states he has questions about his medications and was wondering if he could speak to the nurse before his appointment. I advised him that I will reach out to the nurse but she cannot make any medication changes only go over those medications with him. He verbalized understanding.

## 2019-03-22 ENCOUNTER — Ambulatory Visit: Payer: BLUE CROSS/BLUE SHIELD | Admitting: Gastroenterology

## 2019-03-26 DIAGNOSIS — J309 Allergic rhinitis, unspecified: Secondary | ICD-10-CM | POA: Diagnosis not present

## 2019-03-28 ENCOUNTER — Encounter: Payer: Self-pay | Admitting: Gastroenterology

## 2019-03-28 ENCOUNTER — Ambulatory Visit: Payer: BC Managed Care – PPO | Admitting: Gastroenterology

## 2019-03-28 ENCOUNTER — Other Ambulatory Visit: Payer: Self-pay

## 2019-03-28 ENCOUNTER — Other Ambulatory Visit (INDEPENDENT_AMBULATORY_CARE_PROVIDER_SITE_OTHER): Payer: BC Managed Care – PPO

## 2019-03-28 VITALS — BP 110/64 | HR 72 | Temp 98.4°F | Ht 66.25 in | Wt 186.4 lb

## 2019-03-28 DIAGNOSIS — R188 Other ascites: Secondary | ICD-10-CM

## 2019-03-28 DIAGNOSIS — K7460 Unspecified cirrhosis of liver: Secondary | ICD-10-CM | POA: Diagnosis not present

## 2019-03-28 DIAGNOSIS — R748 Abnormal levels of other serum enzymes: Secondary | ICD-10-CM | POA: Diagnosis not present

## 2019-03-28 LAB — BASIC METABOLIC PANEL
BUN: 11 mg/dL (ref 6–23)
CO2: 29 mEq/L (ref 19–32)
Calcium: 9.6 mg/dL (ref 8.4–10.5)
Chloride: 100 mEq/L (ref 96–112)
Creatinine, Ser: 0.76 mg/dL (ref 0.40–1.50)
GFR: 105.44 mL/min (ref >60.00)
Glucose, Bld: 102 mg/dL — ABNORMAL HIGH (ref 70–99)
Potassium: 3.8 mEq/L (ref 3.5–5.1)
Sodium: 136 mEq/L (ref 135–145)

## 2019-03-28 NOTE — Progress Notes (Signed)
Chief Complaint: Elevated liver enzymes, hospital follow-up  Referring Provider:     [name], MD  GI History: 58 year old male with a history of psoriatic arthritis (previously treated with Enbrel, recently switched to Cosentyx), hypertension, hyperlipidemia, and history of alcohol abuse, admitted earlier this month with progressive abdominal distention in the setting of elevated liver enzymes.   Does have a history of elevated liver enzymes in the past, and was evaluated (he thinks) at [facility] approximately 5 years ago.  No prior liver biopsy. Aside from elevated liver enzymes and "fatty liver ", no previous known liver disease.  No known family history of liver disease.  Does admit to heavy alcohol use in the past, but more recently was drinking 1 to 2 glasses of wine/night.  No prior hospitalizations for EtOH issues.  Admission evaluation with AST/ALT 136/53, T bili 2.1, ALP 224. Paracentesis, removed 1.8 L, negative for SBP, high SAAG c/w portal hypertension. Abdominal ultrasound with moderate ascites and thick GB wall, with possible PV thrombus.  Subsequent CT demonstrates cirrhotic appearing liver, ascites but no PV thrombus.  Repeat ultrasound with Doppler with patent vessels.  AST/ALT/ALP/TBili at d/c was 108/42/187/1.9  Cirrhosis Evaluation: - Etiology: Steatohepatitis.  Unclear if EtOH  vs NASH (several RFs to include hypertension, hyperlipidemia, central adiposity/history of obesity)   - Complications: Ascites - HCC screening: 03/2019: CT and ultrasound, AFP negative (3.6) - Variceal screening: To schedule EGD today - Serologic evaluation: Completed 03/2019-all negative - Viral hepatitis vaccination: HBsAg-, check HAV and HBV immunity today - Flu vaccine: Declines - Liver biopsy: None to date - Medications: Lasix 40 mg, Aldactone 100 mg - MELD: 11 - Child Pugh score: B (8 points)  Additionally had diarrhea and hematochezia, which resolved since  switching from Enbrel to Cosentyx 2 weeks prior to admission.  No recurrence.  Endoscopic history: - Colonoscopy (01/2013, Dr. Jarold Motto): Normal.  Repeat in 10 years   HPI:     Andrew Patton is a 58 y.o. male referred to the [facility]ology [facility] for hospital follow-up and ongoing evaluation of elevated liver enzymes and newly diagnosed cirrhosis.   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 Having trouble sticking to low Na diet, but trying to keep <2 gm. Taking all meds as prescribed. Not taking lactulose as he has not had any confusion. No EtOH since hospital d/c.  No EtOH withdrawal, cravings, complications.  No reaccumulation of ascites.  No edema.  Past Medical History:  Diagnosis Date  . Allergy   . Anxiety   . Arthritis   . Diabetes ([facility])   . Elevated transaminase level   . Fatigue   . Fatty liver   . Hyperlipidemia   . Hypertension   . Hypothyroidism   . Inguinal muscle strain   . Insomnia   . Left knee pain   . Multinodular goiter   . Obesity   . Psoriasis   . Tinnitus      Past Surgical History:  Procedure Laterality Date  . APPENDECTOMY    . FOOT SURGERY    . NOSE SURGERY    . ROTATOR CUFF REPAIR     left   Family History  Problem Relation Age of Onset  . Breast cancer Mother   . Hyperlipidemia Mother   . Bladder Cancer Father   . Parkinson's disease Father   . Birth defects Maternal Grandfather   . Birth defects Paternal Grandmother   . Colon cancer  Neg Hx   . Esophageal cancer Neg Hx   . Rectal cancer Neg Hx   . Stomach cancer Neg Hx    Social History   Tobacco Use  . Smoking status: Former Research scientist (life sciences)  . Smokeless tobacco: Never Used  Substance Use Topics  . Alcohol use: Yes    Alcohol/week: 2.0 standard drinks    Types: 2 Glasses of wine per week    Comment: daily  . Drug use: No   Current Outpatient Medications  Medication Sig Dispense Refill  . azelastine (ASTELIN) 0.1 % nasal spray Place 1 spray into both nostrils 2 (two) times  daily. Use in each nostril as directed    . busPIRone (BUSPAR) 15 MG tablet Take 15 mg by mouth 2 (two) times daily.     . Cholecalciferol (VITAMIN D3) 1000 UNITS CAPS Take 1 capsule by mouth daily.     . colesevelam (WELCHOL) 625 MG tablet Take 1,250 mg by mouth daily.    Marland Kitchen ezetimibe (ZETIA) 10 MG tablet Take 10 mg by mouth daily.    . fluticasone (FLONASE) 50 MCG/ACT nasal spray Place 1 spray into both nostrils daily as needed for allergies.     . furosemide (LASIX) 40 MG tablet Take 1 tablet (40 mg total) by mouth daily. 90 tablet 0  . Krill Oil 500 MG CAPS Take 500 mg by mouth daily.    Marland Kitchen lactulose (CHRONULAC) 10 GM/15ML solution Take 30 meal (20 g) 2-3 times a day for goal of 2-3 bowel movements a day. 946 mL 0  . levocetirizine (XYZAL) 5 MG tablet Take 5 mg by mouth every evening.    . metFORMIN (GLUCOPHAGE) 500 MG tablet Take 500 mg by mouth every evening.    . pantoprazole (PROTONIX) 40 MG tablet Take 40 mg by mouth daily.    . Secukinumab (COSENTYX) 150 MG/ML SOSY Inject 150 mg into the skin every 30 (thirty) days.    . sildenafil (REVATIO) 20 MG tablet Take 1 tablet by mouth as needed.    Marland Kitchen spironolactone (ALDACTONE) 100 MG tablet Take 1 tablet (100 mg total) by mouth daily. 90 tablet 0  . thiamine 100 MG tablet Take 1 tablet (100 mg total) by mouth daily. 90 tablet 0  . vitamin E 1000 UNIT capsule Take 1,000 Units by mouth daily.     No current facility-administered medications for this visit.    Allergies  Allergen Reactions  . Crestor [Rosuvastatin Calcium]     Elevated liver enzymes  . Mobic [Meloxicam] Other (See Comments)    Leg pain  . Penicillins Rash    Has patient had a PCN reaction causing immediate rash, facial/tongue/throat swelling, SOB or lightheadedness with hypotension: No Has patient had a PCN reaction causing severe rash involving mucus membranes or skin necrosis: No Has patient had a PCN reaction that required hospitalization: No Has patient had a PCN  reaction occurring within the last 10 years: No If all of the above answers are "NO", then may proceed with Cephalosporin use.     Review of Systems: All systems reviewed and negative except where noted in HPI.     Physical Exam:    Wt Readings from Last 3 Encounters:  03/28/19 186 lb 6 oz (84.5 kg)  03/08/19 204 lb 9.4 oz (92.8 kg)  05/26/17 189 lb 6.4 oz (85.9 kg)    Temp 98.4 F (36.9 C)   Ht 5' 6.25" (1.683 m) Comment: height measured without shoes  Wt 186 lb 6 oz (  84.5 kg)   BMI 29.86 kg/m  Constitutional:  Pleasant, in no acute distress. Psychiatric: Normal mood and affect. Behavior is normal. EENT: Pupils normal.  Conjunctivae are normal. No scleral icterus. Neck supple. No cervical LAD. Cardiovascular: Normal rate, regular rhythm. No edema Pulmonary/chest: Effort normal and breath sounds normal. No wheezing, rales or rhonchi. Abdominal: Soft, nondistended, nontender. Bowel sounds active throughout. There are no masses palpable. No hepatomegaly. Neurological: Alert and oriented to person place and time. Skin: Skin is warm and dry. No rashes noted.   ASSESSMENT AND PLAN;   1) Cirrhosis 2) Ascites 3) Elevated liver enzymes  - EGD for EV screening - Check HAV and HBV immunity status -Resume diuretics - Check BMP today -Resume low-sodium diet - Resume complete abstinence of alcohol -May consider liver biopsy in the near future if wanting to differentiate between ASH and NASH - 3 month f/u - Labs in 3 months with recalculation of MELD -Ongoing HCC screening in 6 months (MRI)  I spent a total of 25 minutes of face-to-face time with the patient. Greater than 50% of the time was spent counseling and coordinating care.    Shellia CleverlyVito V Amey Hossain, DO, FACG  03/28/2019, 3:08 PM   [name], Walter, MD

## 2019-03-28 NOTE — Patient Instructions (Signed)
You have been scheduled for an endoscopy. Please follow written instructions given to you at your visit today. If you use inhalers (even only as needed), please bring them with you on the day of your procedure.  Your provider has requested that you go to the basement level for lab work before leaving today. Press "B" on the elevator. The lab is located at the first door on the left as you exit the elevator.

## 2019-03-29 LAB — HEPATITIS B SURFACE ANTIBODY,QUALITATIVE: Hep B S Ab: REACTIVE — AB

## 2019-03-29 LAB — HEPATITIS A ANTIBODY, TOTAL: Hepatitis A AB,Total: REACTIVE — AB

## 2019-04-03 NOTE — Telephone Encounter (Signed)
Please review patient message and advise 

## 2019-04-06 ENCOUNTER — Encounter: Payer: Self-pay | Admitting: Gastroenterology

## 2019-04-16 ENCOUNTER — Ambulatory Visit (AMBULATORY_SURGERY_CENTER): Payer: BC Managed Care – PPO | Admitting: Gastroenterology

## 2019-04-16 ENCOUNTER — Encounter: Payer: Self-pay | Admitting: Gastroenterology

## 2019-04-16 ENCOUNTER — Other Ambulatory Visit: Payer: Self-pay

## 2019-04-16 VITALS — BP 131/74 | HR 69 | Temp 98.1°F | Resp 15 | Ht 66.0 in | Wt 186.0 lb

## 2019-04-16 DIAGNOSIS — K3189 Other diseases of stomach and duodenum: Secondary | ICD-10-CM | POA: Diagnosis not present

## 2019-04-16 DIAGNOSIS — R188 Other ascites: Secondary | ICD-10-CM

## 2019-04-16 DIAGNOSIS — K7460 Unspecified cirrhosis of liver: Secondary | ICD-10-CM

## 2019-04-16 DIAGNOSIS — K766 Portal hypertension: Secondary | ICD-10-CM | POA: Diagnosis not present

## 2019-04-16 DIAGNOSIS — K219 Gastro-esophageal reflux disease without esophagitis: Secondary | ICD-10-CM | POA: Diagnosis not present

## 2019-04-16 DIAGNOSIS — K2970 Gastritis, unspecified, without bleeding: Secondary | ICD-10-CM

## 2019-04-16 MED ORDER — SODIUM CHLORIDE 0.9 % IV SOLN: 500.0000 mL | Freq: Once | INTRAVENOUS | Status: DC

## 2019-04-16 NOTE — Patient Instructions (Addendum)
YOU HAD AN ENDOSCOPIC PROCEDURE TODAY AT [facility]:   Refer to the procedure report that was given to you for any specific questions about what was found during the examination.  If the procedure report does not answer your questions, please call your gastroenterologist to clarify.  If you requested that your care partner not be given the details of your procedure findings, then the procedure report has been included in a sealed envelope for you to review at your convenience later.  YOU SHOULD EXPECT: Some feelings of bloating in the abdomen. Passage of more gas than usual.  Walking can help get rid of the air that was put into your GI tract during the procedure and reduce the bloating. If you had a lower endoscopy (such as a colonoscopy or flexible sigmoidoscopy) you may notice spotting of blood in your stool or on the toilet paper. If you underwent a bowel prep for your procedure, you may not have a normal bowel movement for a few days.  Please Note:  You might notice some irritation and congestion in your nose or some drainage.  This is from the oxygen used during your procedure.  There is no need for concern and it should clear up in a day or so.  SYMPTOMS TO REPORT IMMEDIATELY:   Following upper endoscopy (EGD)  Vomiting of blood or coffee ground material  New chest pain or pain under the shoulder blades  Painful or persistently difficult swallowing  New shortness of breath  Fever of 100F or higher  Black, tarry-looking stools  For urgent or emergent issues, a gastroenterologist can be reached at any hour by calling [phone].   DIET:  We do recommend a small meal at first, but then you may proceed to your regular diet.  Drink plenty of fluids but you should avoid alcoholic beverages for 24 hours.  ACTIVITY:  You should plan to take it easy for the rest of today and you should NOT DRIVE or use heavy machinery until tomorrow (because of the sedation medicines used  during the test).    FOLLOW UP: Our staff will call the number listed on your records 48-72 hours following your procedure to check on you and address any questions or concerns that you may have regarding the information given to you following your procedure. If we do not reach you, we will leave a message.  We will attempt to reach you two times.  During this call, we will ask if you have developed any symptoms of COVID 19. If you develop any symptoms (ie: fever, flu-like symptoms, shortness of breath, cough etc.) before then, please call [phone].  If you test positive for Covid 19 in the 2 weeks post procedure, please call and report this information to [name].    If any biopsies were taken you will be contacted by phone or by letter within the next 1-3 weeks.  Please call us at [phone] if you have not heard about the biopsies in 3 weeks.    SIGNATURES/CONFIDENTIALITY: You and/or your care partner have signed paperwork which will be entered into your electronic medical record.  These signatures attest to the fact that that the information above on your After Visit Summary has been reviewed and is understood.  Full responsibility of the confidentiality of this discharge information lies with you and/or your care-partner.    Handout was given to you on gastritis. Your blood sugar was 98 in the recovery room. You may resume your  current medications today. Await biopsy results. The office will call you with an appointment for 3 months.   Please call if any questions or concerns.

## 2019-04-16 NOTE — Progress Notes (Signed)
No problems noted in the recovery room. maw 

## 2019-04-16 NOTE — Op Note (Signed)
[facility] Patient Name: Andrew MontaneDavid Patton Procedure Date: 04/16/2019 2:31 PM MRN: [id number] Endoscopist: Doristine LocksVito Cirigliano , MD Age: [id number] Referring MD:  Date of Birth: October 02, 1960 Gender: Male Account #: [id number] Procedure:                Upper GI endoscopy Indications:              Cirrhosis rule out esophageal varices                           58 yo male with recently-diagnosed cirrhosis, MELD                            11, Child Pugh B, presents for variceal screening. Medicines:                Monitored Anesthesia Care Procedure:                Pre-Anesthesia Assessment:                           - Prior to the procedure, a History and Physical                            was performed, and patient medications and                            allergies were reviewed. The patient's tolerance of                            previous anesthesia was also reviewed. The risks                            and benefits of the procedure and the sedation                            options and risks were discussed with the patient.                            All questions were answered, and informed consent                            was obtained. Prior Anticoagulants: The patient has                            taken no previous anticoagulant or antiplatelet                            agents. ASA Grade Assessment: III - A patient with                            severe systemic disease. After reviewing the risks                            and benefits, the patient was deemed in  satisfactory condition to undergo the procedure.                           After obtaining informed consent, the endoscope was                            passed under direct vision. Throughout the                            procedure, the patient's blood pressure, pulse, and                            oxygen saturations were monitored continuously. The                            Endoscope was  introduced through the mouth, and                            advanced to the second part of duodenum. The upper                            GI endoscopy was accomplished without difficulty.                            The patient tolerated the procedure well. Scope In: Scope Out: Findings:                 The examined esophagus was normal. No esophageal                            varices noted.                           The Z-line was regular and was found 39 cm from the                            incisors.                           Moderate portal hypertensive gastropathy was found                            in the gastric fundus, in the gastric body, at the                            incisura and in the gastric antrum. Biopsies were                            taken with a cold forceps for histology. Estimated                            blood loss was minimal.                           The duodenal bulb, first portion of  the duodenum                            and second portion of the duodenum were normal. Complications:            No immediate complications. Estimated Blood Loss:     Estimated blood loss was minimal. Impression:               - Normal esophagus.                           - Z-line regular, 39 cm from the incisors.                           - Portal hypertensive gastropathy. Biopsied.                           - Normal duodenal bulb, first portion of the                            duodenum and second portion of the duodenum. Recommendation:           - Patient has a contact number available for                            emergencies. The signs and symptoms of potential                            delayed complications were discussed with the                            patient. Return to normal activities tomorrow.                            Written discharge instructions were provided to the                            patient.                           - Resume previous  diet.                           - Continue present medications.                           - Await pathology results.                           - Return to GI clinic in 3 months. [name], MD 04/16/2019 2:42:56 PM

## 2019-04-16 NOTE — Progress Notes (Signed)
Called to room to assist during endoscopic procedure.  Patient ID and intended procedure confirmed with present staff. Received instructions for my participation in the procedure from the performing physician.  

## 2019-04-16 NOTE — Progress Notes (Signed)
Pt's states no medical or surgical changes since previsit or office visit.  No egg or soy allergy  

## 2019-04-16 NOTE — Progress Notes (Signed)
Report to PACU, RN, vss, BBS= Clear.  

## 2019-04-18 ENCOUNTER — Telehealth: Payer: Self-pay

## 2019-04-18 NOTE — Telephone Encounter (Signed)
  Follow up Call-  Call back number 04/16/2019  Post procedure Call Back phone  # [phone]  Permission to leave phone message Yes  Some recent data might be hidden     Patient questions:  Do you have a fever, pain , or abdominal swelling? No. Pain Score  0 *  Have you tolerated food without any problems? Yes.    Have you been able to return to your normal activities? Yes.    Do you have any questions about your discharge instructions: Diet   No. Medications  No. Follow up visit  No.  Do you have questions or concerns about your Care? No.  Actions: * If pain score is 4 or above: No action needed, pain <4.  1. Have you developed a fever since your procedure? no  2.   Have you had an respiratory symptoms (SOB or cough) since your procedure? no  3.   Have you tested positive for COVID 19 since your procedure no  4.   Have you had any family members/close contacts diagnosed with the COVID 19 since your procedure?  no   If yes to any of these questions please route to Joylene John, RN and Alphonsa Gin, Therapist, sports.

## 2019-04-21 ENCOUNTER — Encounter: Payer: Self-pay | Admitting: Gastroenterology

## 2019-05-01 DIAGNOSIS — J309 Allergic rhinitis, unspecified: Secondary | ICD-10-CM | POA: Diagnosis not present

## 2019-05-01 DIAGNOSIS — M19011 Primary osteoarthritis, right shoulder: Secondary | ICD-10-CM | POA: Diagnosis not present

## 2019-05-01 DIAGNOSIS — M25511 Pain in right shoulder: Secondary | ICD-10-CM | POA: Diagnosis not present

## 2019-05-08 DIAGNOSIS — M79672 Pain in left foot: Secondary | ICD-10-CM | POA: Diagnosis not present

## 2019-05-08 DIAGNOSIS — M1990 Unspecified osteoarthritis, unspecified site: Secondary | ICD-10-CM | POA: Diagnosis not present

## 2019-05-08 DIAGNOSIS — Z79899 Other long term (current) drug therapy: Secondary | ICD-10-CM | POA: Diagnosis not present

## 2019-05-08 DIAGNOSIS — M79671 Pain in right foot: Secondary | ICD-10-CM | POA: Diagnosis not present

## 2019-05-08 DIAGNOSIS — L4050 Arthropathic psoriasis, unspecified: Secondary | ICD-10-CM | POA: Diagnosis not present

## 2019-06-12 DIAGNOSIS — K2970 Gastritis, unspecified, without bleeding: Secondary | ICD-10-CM

## 2019-06-12 DIAGNOSIS — R748 Abnormal levels of other serum enzymes: Secondary | ICD-10-CM

## 2019-06-12 DIAGNOSIS — R188 Other ascites: Secondary | ICD-10-CM

## 2019-06-12 DIAGNOSIS — K3189 Other diseases of stomach and duodenum: Secondary | ICD-10-CM

## 2019-06-12 DIAGNOSIS — K766 Portal hypertension: Secondary | ICD-10-CM

## 2019-06-12 DIAGNOSIS — K7460 Unspecified cirrhosis of liver: Secondary | ICD-10-CM

## 2019-06-13 MED ORDER — FUROSEMIDE 40 MG PO TABS: 40.0000 mg | ORAL_TABLET | Freq: Every day | ORAL | 5 refills | Status: DC

## 2019-06-13 MED ORDER — SPIRONOLACTONE 100 MG PO TABS: 100.0000 mg | ORAL_TABLET | Freq: Every day | ORAL | 5 refills | Status: DC

## 2019-07-06 DIAGNOSIS — Z1159 Encounter for screening for other viral diseases: Secondary | ICD-10-CM | POA: Diagnosis not present

## 2019-07-06 DIAGNOSIS — E7800 Pure hypercholesterolemia, unspecified: Secondary | ICD-10-CM | POA: Diagnosis not present

## 2019-07-06 DIAGNOSIS — Z0000 Encounter for general adult medical examination without abnormal findings: Secondary | ICD-10-CM | POA: Diagnosis not present

## 2019-07-06 DIAGNOSIS — E1165 Type 2 diabetes mellitus with hyperglycemia: Secondary | ICD-10-CM | POA: Diagnosis not present

## 2019-07-10 DIAGNOSIS — Z1212 Encounter for screening for malignant neoplasm of rectum: Secondary | ICD-10-CM | POA: Diagnosis not present

## 2019-07-10 DIAGNOSIS — E118 Type 2 diabetes mellitus with unspecified complications: Secondary | ICD-10-CM | POA: Diagnosis not present

## 2019-07-10 DIAGNOSIS — I10 Essential (primary) hypertension: Secondary | ICD-10-CM | POA: Diagnosis not present

## 2019-07-10 DIAGNOSIS — E8881 Metabolic syndrome: Secondary | ICD-10-CM | POA: Diagnosis not present

## 2019-07-10 DIAGNOSIS — Z0000 Encounter for general adult medical examination without abnormal findings: Secondary | ICD-10-CM | POA: Diagnosis not present

## 2019-07-24 DIAGNOSIS — J309 Allergic rhinitis, unspecified: Secondary | ICD-10-CM | POA: Diagnosis not present

## 2019-07-30 DIAGNOSIS — J309 Allergic rhinitis, unspecified: Secondary | ICD-10-CM | POA: Diagnosis not present

## 2019-08-16 DIAGNOSIS — F5104 Psychophysiologic insomnia: Secondary | ICD-10-CM | POA: Diagnosis not present

## 2019-08-16 DIAGNOSIS — F411 Generalized anxiety disorder: Secondary | ICD-10-CM | POA: Diagnosis not present

## 2019-08-16 DIAGNOSIS — I10 Essential (primary) hypertension: Secondary | ICD-10-CM | POA: Diagnosis not present

## 2019-08-16 DIAGNOSIS — K7460 Unspecified cirrhosis of liver: Secondary | ICD-10-CM | POA: Diagnosis not present

## 2019-08-22 DIAGNOSIS — M7532 Calcific tendinitis of left shoulder: Secondary | ICD-10-CM | POA: Diagnosis not present

## 2019-08-22 DIAGNOSIS — M25512 Pain in left shoulder: Secondary | ICD-10-CM | POA: Diagnosis not present

## 2019-08-27 DIAGNOSIS — J309 Allergic rhinitis, unspecified: Secondary | ICD-10-CM | POA: Diagnosis not present

## 2019-09-10 DIAGNOSIS — L4050 Arthropathic psoriasis, unspecified: Secondary | ICD-10-CM | POA: Diagnosis not present

## 2019-09-10 DIAGNOSIS — Z79899 Other long term (current) drug therapy: Secondary | ICD-10-CM | POA: Diagnosis not present

## 2019-09-10 DIAGNOSIS — M1990 Unspecified osteoarthritis, unspecified site: Secondary | ICD-10-CM | POA: Diagnosis not present

## 2019-09-10 DIAGNOSIS — L409 Psoriasis, unspecified: Secondary | ICD-10-CM | POA: Diagnosis not present

## 2019-09-13 DIAGNOSIS — I10 Essential (primary) hypertension: Secondary | ICD-10-CM | POA: Diagnosis not present

## 2019-09-13 DIAGNOSIS — F329 Major depressive disorder, single episode, unspecified: Secondary | ICD-10-CM | POA: Diagnosis not present

## 2019-09-13 DIAGNOSIS — F411 Generalized anxiety disorder: Secondary | ICD-10-CM | POA: Diagnosis not present

## 2019-09-26 DIAGNOSIS — J309 Allergic rhinitis, unspecified: Secondary | ICD-10-CM | POA: Diagnosis not present

## 2019-10-04 DIAGNOSIS — E7800 Pure hypercholesterolemia, unspecified: Secondary | ICD-10-CM | POA: Diagnosis not present

## 2019-10-04 DIAGNOSIS — I10 Essential (primary) hypertension: Secondary | ICD-10-CM | POA: Diagnosis not present

## 2019-10-04 DIAGNOSIS — F329 Major depressive disorder, single episode, unspecified: Secondary | ICD-10-CM | POA: Diagnosis not present

## 2019-10-04 DIAGNOSIS — F411 Generalized anxiety disorder: Secondary | ICD-10-CM | POA: Diagnosis not present

## 2019-10-15 ENCOUNTER — Encounter (INDEPENDENT_AMBULATORY_CARE_PROVIDER_SITE_OTHER): Payer: Self-pay | Admitting: Otolaryngology

## 2019-10-15 ENCOUNTER — Other Ambulatory Visit: Payer: Self-pay

## 2019-10-15 ENCOUNTER — Ambulatory Visit (INDEPENDENT_AMBULATORY_CARE_PROVIDER_SITE_OTHER): Payer: BC Managed Care – PPO | Admitting: Otolaryngology

## 2019-10-15 VITALS — Temp 98.1°F

## 2019-10-15 DIAGNOSIS — J0191 Acute recurrent sinusitis, unspecified: Secondary | ICD-10-CM | POA: Diagnosis not present

## 2019-10-15 DIAGNOSIS — J342 Deviated nasal septum: Secondary | ICD-10-CM | POA: Diagnosis not present

## 2019-10-15 DIAGNOSIS — J310 Chronic rhinitis: Secondary | ICD-10-CM | POA: Diagnosis not present

## 2019-10-15 NOTE — Progress Notes (Signed)
HPI: Andrew Patton is a 59 y.o. male who returns today for evaluation of recurrent sinus issues.  He has been complaining of a lot of thick postnasal drainage.  As well as some scabbing crusting in the anterior nose and occasional bleeding.  He has known septal deviation to the left with more trouble breathing through the left side.  He uses Flonase as well as azelastine nasal sprays.  He also uses saline irrigations. He was previously prescribed Ceftin little over 2 months ago..  Past Medical History:  Diagnosis Date  . Allergy   . Anxiety   . Arthritis   . Ascites   . Diabetes (HCC)   . Elevated transaminase level   . Fatigue   . Fatty liver   . Hyperlipidemia   . Hypertension   . Hypothyroidism   . Inguinal muscle strain   . Insomnia   . Left knee pain   . Multinodular goiter   . Obesity   . Pneumonia   . Psoriasis   . Tinnitus    Past Surgical History:  Procedure Laterality Date  . APPENDECTOMY    . FOOT SURGERY Bilateral 1974  . NOSE SURGERY    . ROTATOR CUFF REPAIR Left    left   Social History   Socioeconomic History  . Marital status: Married    Spouse name: Not on file  . Number of children: 2  . Years of education: Not on file  . Highest education level: Not on file  Occupational History  . Occupation: Airline pilot  Tobacco Use  . Smoking status: Former Smoker    Types: Cigarettes    Quit date: 03/27/1994    Years since quitting: 25.5  . Smokeless tobacco: Never Used  Substance and Sexual Activity  . Alcohol use: Yes    Alcohol/week: 2.0 standard drinks    Types: 2 Glasses of wine per week    Comment: daily  . Drug use: No  . Sexual activity: Not on file  Other Topics Concern  . Not on file  Social History Narrative  . Not on file   Social Determinants of Health   Financial Resource Strain:   . Difficulty of Paying Living Expenses:   Food Insecurity:   . Worried About Programme researcher, broadcasting/film/video in the Last Year:   . Barista in the Last Year:    Transportation Needs:   . Freight forwarder (Medical):   Marland Kitchen Lack of Transportation (Non-Medical):   Physical Activity:   . Days of Exercise per Week:   . Minutes of Exercise per Session:   Stress:   . Feeling of Stress :   Social Connections:   . Frequency of Communication with Friends and Family:   . Frequency of Social Gatherings with Friends and Family:   . Attends Religious Services:   . Active Member of Clubs or Organizations:   . Attends Banker Meetings:   Marland Kitchen Marital Status:    Family History  Problem Relation Age of Onset  . Breast cancer Mother   . Hyperlipidemia Mother   . Bladder Cancer Father   . Parkinson's disease Father   . Birth defects Maternal Grandfather   . Birth defects Paternal Grandmother   . Colon cancer Neg Hx   . Esophageal cancer Neg Hx   . Rectal cancer Neg Hx   . Stomach cancer Neg Hx   . Colon polyps Neg Hx    Allergies  Allergen Reactions  .  Crestor [Rosuvastatin Calcium]     Elevated liver enzymes  . Mobic [Meloxicam] Other (See Comments)    Leg pain  . Penicillins Rash    Has patient had a PCN reaction causing immediate rash, facial/tongue/throat swelling, SOB or lightheadedness with hypotension: No Has patient had a PCN reaction causing severe rash involving mucus membranes or skin necrosis: No Has patient had a PCN reaction that required hospitalization: No Has patient had a PCN reaction occurring within the last 10 years: No If all of the above answers are "NO", then may proceed with Cephalosporin use.   Prior to Admission medications   Medication Sig Start Date End Date Taking? Authorizing Provider  azelastine (ASTELIN) 0.1 % nasal spray Place 1 spray into both nostrils 2 (two) times daily. Use in each nostril as directed    [provider]  busPIRone (BUSPAR) 15 MG tablet Take 15 mg by mouth 2 (two) times daily.     [provider]  Cholecalciferol (VITAMIN D3) 1000 UNITS CAPS Take 1 capsule by  mouth daily.     [provider]  colesevelam (WELCHOL) 625 MG tablet Take 1,250 mg by mouth daily.    [provider]  ezetimibe (ZETIA) 10 MG tablet Take 10 mg by mouth daily.    [provider]  fluticasone (FLONASE) 50 MCG/ACT nasal spray Place 1 spray into both nostrils daily as needed for allergies.  01/05/13   [provider]  furosemide (LASIX) 40 MG tablet Take 1 tablet (40 mg total) by mouth daily. 06/13/19   Cirigliano, Vito V, DO  ipratropium (ATROVENT) 0.06 % nasal spray Place 1 spray into the nose daily. 02/17/19   [provider]  Javier Docker Oil 500 MG CAPS Take 500 mg by mouth daily.    [provider]  lactulose (CHRONULAC) 10 GM/15ML solution Take 30 meal (20 g) 2-3 times a day for goal of 2-3 bowel movements a day. 03/09/19   Mercy Riding, MD  levocetirizine (XYZAL) 5 MG tablet Take 5 mg by mouth every evening.    [provider]  metFORMIN (GLUCOPHAGE) 500 MG tablet Take 500 mg by mouth every evening.    [provider]  pantoprazole (PROTONIX) 40 MG tablet Take 40 mg by mouth daily.    [provider]  Secukinumab (COSENTYX) 150 MG/ML SOSY Inject 150 mg into the skin every 30 (thirty) days.    [provider]  sildenafil (REVATIO) 20 MG tablet Take 1 tablet by mouth as needed. 02/26/19   [provider]  spironolactone (ALDACTONE) 100 MG tablet Take 1 tablet (100 mg total) by mouth daily. 06/13/19   Cirigliano, Vito V, DO  thiamine 100 MG tablet Take 1 tablet (100 mg total) by mouth daily. 03/10/19   Mercy Riding, MD  traZODone (DESYREL) 50 MG tablet Take 1 tablet by mouth at bedtime. 03/16/19   [provider]  vitamin E 1000 UNIT capsule Take 1,000 Units by mouth daily.    [provider]     Positive ROS: Otherwise negative  All other systems have been reviewed and were otherwise negative with the exception of those mentioned in the HPI and as above.  Physical  Exam: Constitutional: Alert, well-appearing, no acute distress Ears: External ears without lesions or tenderness. Ear canals are clear bilaterally.  Right TM is clear.  Left TM reveals an extruding left myringotomy tube that was placed 2 years ago.  The TM is otherwise clear.  On tuning fork testing  he heard about the same in both ears. Nasal: External nose without lesions. Septum deviated to the left with a concavity of the cartilaginous septum on the right side with crusting and scabbing.  He also has some crusting and scabbing along the inferior aspect of the left nasal cavity..  No meatus regions are clear with no active purulent discharge noted but he does have some mild thick mucus within the nasal cavity. Oral: Lips and gums without lesions. Tongue and palate mucosa without lesions. Posterior oropharynx clear. Neck: No palpable adenopathy or masses Respiratory: Breathing comfortably  Skin: No facial/neck lesions or rash noted.  Procedures  Assessment: Chronic rhinitis with septal deviation to the left. Mild vestibulitis.  Plan: Recommended using the nasal sprays to direct them more laterally. Also prescribed mupirocin 2% ointment to use twice daily for a week in both nostrils. Prescribed Ceftin 250 mg twice daily for 10 days. He will follow-up as needed.   Narda Bonds, MD

## 2019-10-18 DIAGNOSIS — M25511 Pain in right shoulder: Secondary | ICD-10-CM | POA: Diagnosis not present

## 2019-10-29 DIAGNOSIS — J309 Allergic rhinitis, unspecified: Secondary | ICD-10-CM | POA: Diagnosis not present

## 2019-10-30 DIAGNOSIS — M25511 Pain in right shoulder: Secondary | ICD-10-CM | POA: Diagnosis not present

## 2019-11-01 DIAGNOSIS — E1165 Type 2 diabetes mellitus with hyperglycemia: Secondary | ICD-10-CM | POA: Diagnosis not present

## 2019-11-01 DIAGNOSIS — E118 Type 2 diabetes mellitus with unspecified complications: Secondary | ICD-10-CM | POA: Diagnosis not present

## 2019-11-01 DIAGNOSIS — K7460 Unspecified cirrhosis of liver: Secondary | ICD-10-CM | POA: Diagnosis not present

## 2019-11-01 DIAGNOSIS — F419 Anxiety disorder, unspecified: Secondary | ICD-10-CM | POA: Diagnosis not present

## 2019-11-05 DIAGNOSIS — I10 Essential (primary) hypertension: Secondary | ICD-10-CM | POA: Diagnosis not present

## 2019-11-05 DIAGNOSIS — E039 Hypothyroidism, unspecified: Secondary | ICD-10-CM | POA: Diagnosis not present

## 2019-11-05 DIAGNOSIS — R6889 Other general symptoms and signs: Secondary | ICD-10-CM | POA: Diagnosis not present

## 2019-11-13 DIAGNOSIS — M25511 Pain in right shoulder: Secondary | ICD-10-CM | POA: Diagnosis not present

## 2019-11-13 DIAGNOSIS — M75121 Complete rotator cuff tear or rupture of right shoulder, not specified as traumatic: Secondary | ICD-10-CM | POA: Diagnosis not present

## 2019-11-13 DIAGNOSIS — M19011 Primary osteoarthritis, right shoulder: Secondary | ICD-10-CM | POA: Diagnosis not present

## 2019-11-22 DIAGNOSIS — E041 Nontoxic single thyroid nodule: Secondary | ICD-10-CM | POA: Diagnosis not present

## 2019-11-22 DIAGNOSIS — E039 Hypothyroidism, unspecified: Secondary | ICD-10-CM | POA: Diagnosis not present

## 2019-11-29 DIAGNOSIS — R946 Abnormal results of thyroid function studies: Secondary | ICD-10-CM | POA: Diagnosis not present

## 2019-11-29 DIAGNOSIS — K760 Fatty (change of) liver, not elsewhere classified: Secondary | ICD-10-CM | POA: Diagnosis not present

## 2019-11-29 DIAGNOSIS — R7989 Other specified abnormal findings of blood chemistry: Secondary | ICD-10-CM | POA: Diagnosis not present

## 2019-11-29 DIAGNOSIS — E041 Nontoxic single thyroid nodule: Secondary | ICD-10-CM | POA: Diagnosis not present

## 2019-12-10 DIAGNOSIS — S43431A Superior glenoid labrum lesion of right shoulder, initial encounter: Secondary | ICD-10-CM | POA: Diagnosis not present

## 2019-12-10 DIAGNOSIS — Y999 Unspecified external cause status: Secondary | ICD-10-CM | POA: Diagnosis not present

## 2019-12-10 DIAGNOSIS — M19011 Primary osteoarthritis, right shoulder: Secondary | ICD-10-CM | POA: Diagnosis not present

## 2019-12-10 DIAGNOSIS — G8918 Other acute postprocedural pain: Secondary | ICD-10-CM | POA: Diagnosis not present

## 2019-12-10 DIAGNOSIS — M7551 Bursitis of right shoulder: Secondary | ICD-10-CM | POA: Diagnosis not present

## 2019-12-10 DIAGNOSIS — X58XXXA Exposure to other specified factors, initial encounter: Secondary | ICD-10-CM | POA: Diagnosis not present

## 2019-12-10 DIAGNOSIS — M7541 Impingement syndrome of right shoulder: Secondary | ICD-10-CM | POA: Diagnosis not present

## 2019-12-10 DIAGNOSIS — M659 Synovitis and tenosynovitis, unspecified: Secondary | ICD-10-CM | POA: Diagnosis not present

## 2019-12-13 DIAGNOSIS — M25511 Pain in right shoulder: Secondary | ICD-10-CM | POA: Diagnosis not present

## 2019-12-20 HISTORY — PX: SHOULDER SURGERY: SHX246

## 2019-12-21 DIAGNOSIS — M25511 Pain in right shoulder: Secondary | ICD-10-CM | POA: Diagnosis not present

## 2019-12-28 DIAGNOSIS — M25511 Pain in right shoulder: Secondary | ICD-10-CM | POA: Diagnosis not present

## 2020-01-04 DIAGNOSIS — M25511 Pain in right shoulder: Secondary | ICD-10-CM | POA: Diagnosis not present

## 2020-01-11 DIAGNOSIS — M25511 Pain in right shoulder: Secondary | ICD-10-CM | POA: Diagnosis not present

## 2020-01-15 DIAGNOSIS — M25511 Pain in right shoulder: Secondary | ICD-10-CM | POA: Diagnosis not present

## 2020-01-25 DIAGNOSIS — M25511 Pain in right shoulder: Secondary | ICD-10-CM | POA: Diagnosis not present

## 2020-01-28 DIAGNOSIS — R946 Abnormal results of thyroid function studies: Secondary | ICD-10-CM | POA: Diagnosis not present

## 2020-01-31 DIAGNOSIS — E041 Nontoxic single thyroid nodule: Secondary | ICD-10-CM | POA: Diagnosis not present

## 2020-01-31 DIAGNOSIS — R946 Abnormal results of thyroid function studies: Secondary | ICD-10-CM | POA: Diagnosis not present

## 2020-01-31 DIAGNOSIS — R7989 Other specified abnormal findings of blood chemistry: Secondary | ICD-10-CM | POA: Diagnosis not present

## 2020-01-31 DIAGNOSIS — K760 Fatty (change of) liver, not elsewhere classified: Secondary | ICD-10-CM | POA: Diagnosis not present

## 2020-02-08 ENCOUNTER — Other Ambulatory Visit: Payer: Self-pay

## 2020-02-08 ENCOUNTER — Emergency Department (HOSPITAL_BASED_OUTPATIENT_CLINIC_OR_DEPARTMENT_OTHER): Payer: BC Managed Care – PPO

## 2020-02-08 ENCOUNTER — Emergency Department (HOSPITAL_BASED_OUTPATIENT_CLINIC_OR_DEPARTMENT_OTHER)
Admission: EM | Admit: 2020-02-08 | Discharge: 2020-02-08 | Disposition: A | Discharge status: Home / Self Care | Payer: BC Managed Care – PPO | Attending: Emergency Medicine | Admitting: Emergency Medicine

## 2020-02-08 DIAGNOSIS — R519 Headache, unspecified: Secondary | ICD-10-CM | POA: Insufficient documentation

## 2020-02-08 DIAGNOSIS — S0083XA Contusion of other part of head, initial encounter: Secondary | ICD-10-CM | POA: Diagnosis not present

## 2020-02-08 DIAGNOSIS — G9389 Other specified disorders of brain: Secondary | ICD-10-CM

## 2020-02-08 DIAGNOSIS — F0781 Postconcussional syndrome: Secondary | ICD-10-CM | POA: Diagnosis not present

## 2020-02-08 DIAGNOSIS — E119 Type 2 diabetes mellitus without complications: Secondary | ICD-10-CM | POA: Insufficient documentation

## 2020-02-08 DIAGNOSIS — Z79899 Other long term (current) drug therapy: Secondary | ICD-10-CM | POA: Diagnosis not present

## 2020-02-08 DIAGNOSIS — Y998 Other external cause status: Secondary | ICD-10-CM | POA: Diagnosis not present

## 2020-02-08 DIAGNOSIS — I10 Essential (primary) hypertension: Secondary | ICD-10-CM | POA: Diagnosis not present

## 2020-02-08 DIAGNOSIS — Z87891 Personal history of nicotine dependence: Secondary | ICD-10-CM | POA: Diagnosis not present

## 2020-02-08 DIAGNOSIS — S0990XA Unspecified injury of head, initial encounter: Secondary | ICD-10-CM | POA: Diagnosis not present

## 2020-02-08 DIAGNOSIS — W010XXA Fall on same level from slipping, tripping and stumbling without subsequent striking against object, initial encounter: Secondary | ICD-10-CM | POA: Insufficient documentation

## 2020-02-08 DIAGNOSIS — Y92002 Bathroom of unspecified non-institutional (private) residence single-family (private) house as the place of occurrence of the external cause: Secondary | ICD-10-CM | POA: Diagnosis not present

## 2020-02-08 DIAGNOSIS — Y9389 Activity, other specified: Secondary | ICD-10-CM | POA: Diagnosis not present

## 2020-02-08 DIAGNOSIS — S0511XA Contusion of eyeball and orbital tissues, right eye, initial encounter: Secondary | ICD-10-CM | POA: Diagnosis not present

## 2020-02-08 LAB — COMPREHENSIVE METABOLIC PANEL
ALT: 75 U/L — ABNORMAL HIGH (ref 0–44)
AST: 141 U/L — ABNORMAL HIGH (ref 15–41)
Albumin: 4.7 g/dL (ref 3.5–5.0)
Alkaline Phosphatase: 179 U/L — ABNORMAL HIGH (ref 38–126)
Anion gap: 15 (ref 5–15)
BUN: 10 mg/dL (ref 6–20)
CO2: 28 mmol/L (ref 22–32)
Calcium: 8.9 mg/dL (ref 8.9–10.3)
Chloride: 90 mmol/L — ABNORMAL LOW (ref 98–111)
Creatinine, Ser: 0.83 mg/dL (ref 0.61–1.24)
GFR calc Af Amer: >60 mL/min (ref >60)
GFR calc non Af Amer: >60 mL/min (ref >60)
Glucose, Bld: 132 mg/dL — ABNORMAL HIGH (ref 70–99)
Potassium: 3.7 mmol/L (ref 3.5–5.1)
Sodium: 133 mmol/L — ABNORMAL LOW (ref 135–145)
Total Bilirubin: 3.9 mg/dL — ABNORMAL HIGH (ref 0.3–1.2)
Total Protein: 8.2 g/dL — ABNORMAL HIGH (ref 6.5–8.1)

## 2020-02-08 LAB — CBC WITH DIFFERENTIAL/PLATELET
Abs Immature Granulocytes: 0.02 10*3/uL (ref 0.00–0.07)
Basophils Absolute: 0 10*3/uL (ref 0.0–0.1)
Basophils Relative: 1 %
Eosinophils Absolute: 0.1 10*3/uL (ref 0.0–0.5)
Eosinophils Relative: 2 %
HCT: 44.2 % (ref 39.0–52.0)
Hemoglobin: 14.9 g/dL (ref 13.0–17.0)
Immature Granulocytes: 0 %
Lymphocytes Relative: 27 %
Lymphs Abs: 1.4 10*3/uL (ref 0.7–4.0)
MCH: 34.8 pg — ABNORMAL HIGH (ref 26.0–34.0)
MCHC: 33.7 g/dL (ref 30.0–36.0)
MCV: 103.3 fL — ABNORMAL HIGH (ref 80.0–100.0)
Monocytes Absolute: 0.7 10*3/uL (ref 0.1–1.0)
Monocytes Relative: 13 %
Neutro Abs: 3 10*3/uL (ref 1.7–7.7)
Neutrophils Relative %: 57 %
Platelets: 164 10*3/uL (ref 150–400)
RBC: 4.28 MIL/uL (ref 4.22–5.81)
RDW: 15.1 % (ref 11.5–15.5)
WBC: 5.2 10*3/uL (ref 4.0–10.5)
nRBC: 0 % (ref 0.0–0.2)

## 2020-02-08 LAB — PROTIME-INR
INR: 1.1 (ref 0.8–1.2)
Prothrombin Time: 13.6 seconds (ref 11.4–15.2)

## 2020-02-08 MED ORDER — ONDANSETRON 4 MG PO TBDP
4.0000 mg | ORAL_TABLET | Freq: Three times a day (TID) | ORAL | 0 refills | Status: DC | PRN
Start: 2020-02-08 — End: 2024-06-06

## 2020-02-08 NOTE — ED Provider Notes (Signed)
[facility] EMERGENCY DEPARTMENT Provider Note   CSN: [id number] Arrival date & time: 02/08/20  1024     History Chief Complaint  Patient presents with  . Fall    Andrew Patton is a 59 y.o. male past history of diabetes, transaminitis, hypertension, hypothyroidism who presents for evaluation of headache, nausea, fatigue, photophobia, pain to right eye after mechanical fall approximately 9 days ago.  He reports that in the middle the night, he was walking from the bedroom to the bathroom and states that he tripped and fell, hitting his face and head against the dresser.  He does not know if he had any LOC.  This fall was unwitnessed.  He is not currently on blood thinners.  He reports that since then, he has had some headache as well as pain to the right side of his face and around his eye.  He is also noted some photophobia as well as nausea and fatigue.  He states that occasionally, after smelling food, he will have some dry heaves but has not had any actual vomiting.  He feels slightly lightheaded with walking.  He denies any preceding chest pain or dizziness that caused him to fall.  He denies any neck pain, numbness/weakness of his arms or legs, abdominal pain, chest pain, difficulty breathing.  The history is provided by the patient.       Past Medical History:  Diagnosis Date  . Allergy   . Anxiety   . Arthritis   . Ascites   . Diabetes ([place])   . Elevated transaminase level   . Fatigue   . Fatty liver   . Hyperlipidemia   . Hypertension   . Hypothyroidism   . Inguinal muscle strain   . Insomnia   . Left knee pain   . Multinodular goiter   . Obesity   . Pneumonia   . Psoriasis   . Tinnitus     Patient Active Problem List   Diagnosis Date Noted  . Abdominal distention   . Diarrhea   . Rectal bleeding 03/08/2019  . Abnormal liver function 03/08/2019  . Portal vein thrombosis 03/07/2019  . Ascites of liver 03/07/2019  . Abdominal pain 03/07/2019  .  Unspecified essential hypertension 04/29/2014  . Other and unspecified hyperlipidemia 04/29/2014    Past Surgical History:  Procedure Laterality Date  . APPENDECTOMY    . FOOT SURGERY Bilateral 1974  . NOSE SURGERY    . ROTATOR CUFF REPAIR Left    left       Family History  Problem Relation Age of Onset  . Breast cancer Mother   . Hyperlipidemia Mother   . Bladder Cancer Father   . Parkinson's disease Father   . Birth defects Maternal Grandfather   . Birth defects Paternal Grandmother   . Colon cancer Neg Hx   . Esophageal cancer Neg Hx   . Rectal cancer Neg Hx   . Stomach cancer Neg Hx   . Colon polyps Neg Hx     Social History   Tobacco Use  . Smoking status: Former Smoker    Types: Cigarettes    Quit date: 03/27/1994    Years since quitting: 25.8  . Smokeless tobacco: Never Used  Vaping Use  . Vaping Use: Never used  Substance Use Topics  . Alcohol use: Yes    Alcohol/week: 2.0 standard drinks    Types: 2 Glasses of wine per week    Comment: daily  . Drug use: No  Home Medications Prior to Admission medications   Medication Sig Start Date End Date Taking? Authorizing Provider  azelastine (ASTELIN) 0.1 % nasal spray Place 1 spray into both nostrils 2 (two) times daily. Use in each nostril as directed    [provider]  busPIRone (BUSPAR) 15 MG tablet Take 15 mg by mouth 2 (two) times daily.     [provider]  Cholecalciferol (VITAMIN D3) 1000 UNITS CAPS Take 1 capsule by mouth daily.     [provider]  colesevelam (WELCHOL) 625 MG tablet Take 1,250 mg by mouth daily.    [provider]  ezetimibe (ZETIA) 10 MG tablet Take 10 mg by mouth daily.    [provider]  fluticasone (FLONASE) 50 MCG/ACT nasal spray Place 1 spray into both nostrils daily as needed for allergies.  01/05/13   [provider]  furosemide (LASIX) 40 MG tablet Take 1 tablet (40 mg total) by mouth daily. 06/13/19   Cirigliano, Vito  V, DO  ipratropium (ATROVENT) 0.06 % nasal spray Place 1 spray into the nose daily. 02/17/19   [provider]  Javier Docker Oil 500 MG CAPS Take 500 mg by mouth daily.    [provider]  lactulose (CHRONULAC) 10 GM/15ML solution Take 30 meal (20 g) 2-3 times a day for goal of 2-3 bowel movements a day. 03/09/19   Mercy Riding, MD  levocetirizine (XYZAL) 5 MG tablet Take 5 mg by mouth every evening.    [provider]  metFORMIN (GLUCOPHAGE) 500 MG tablet Take 500 mg by mouth every evening.    [provider]  ondansetron (ZOFRAN ODT) 4 MG disintegrating tablet Take 1 tablet (4 mg total) by mouth every 8 (eight) hours as needed for nausea or vomiting. 02/08/20   Volanda Napoleon, PA-C  pantoprazole (PROTONIX) 40 MG tablet Take 40 mg by mouth daily.    [provider]  Secukinumab (COSENTYX) 150 MG/ML SOSY Inject 150 mg into the skin every 30 (thirty) days.    [provider]  sildenafil (REVATIO) 20 MG tablet Take 1 tablet by mouth as needed. 02/26/19   [provider]  spironolactone (ALDACTONE) 100 MG tablet Take 1 tablet (100 mg total) by mouth daily. 06/13/19   Cirigliano, Vito V, DO  thiamine 100 MG tablet Take 1 tablet (100 mg total) by mouth daily. 03/10/19   Mercy Riding, MD  traZODone (DESYREL) 50 MG tablet Take 1 tablet by mouth at bedtime. 03/16/19   [provider]  vitamin E 1000 UNIT capsule Take 1,000 Units by mouth daily.    [provider]    Allergies    Crestor [rosuvastatin calcium], Mobic [meloxicam], and Penicillins  Review of Systems   Review of Systems  Constitutional: Positive for fatigue.  Eyes: Positive for photophobia and pain. Negative for visual disturbance.  Respiratory: Negative for cough and shortness of breath.   Cardiovascular: Negative for chest pain.  Gastrointestinal: Positive for nausea. Negative for abdominal pain and vomiting.  Genitourinary: Negative for dysuria and hematuria.    Musculoskeletal: Negative for neck pain.  Neurological: Positive for headaches. Negative for weakness and numbness.  All other systems reviewed and are negative.   Physical Exam Updated Vital Signs BP (!) 147/87 (BP Location: Right Arm)   Pulse 88   Temp 98.5 F (36.9 C)   Resp 18   Ht '5\' 7"'$  (1.702 m)   Wt 88 kg   SpO2 99%   BMI 30.38 kg/m  Physical Exam Vitals and nursing note reviewed.  Constitutional:      Appearance: Normal appearance. He is well-developed.  HENT:     Head: Normocephalic and atraumatic.      Comments: No tenderness to palpation of skull. No deformities or crepitus noted. No open wounds, abrasions or lacerations.  Tenderness palpation noted to the right inferior periorbital region that extends to the right zygomatic arch.  No deformity or crepitus noted. Eyes:     General: Lids are normal.     Conjunctiva/sclera: Conjunctivae normal.     Pupils: Pupils are equal, round, and reactive to light.     Comments: PERRL. EOMs intact. No nystagmus. No neglect.  Tenderness palpation noted both superior and inferior periorbital region.  Visual fields intact.  He can correctly identify how many fingers I am holding up.  No hyphema, hypopyon.  Neck:     Comments: Full flexion/extension and lateral movement of neck fully intact. No bony midline tenderness. No deformities or crepitus.  Cardiovascular:     Rate and Rhythm: Normal rate and regular rhythm.     Pulses: Normal pulses.     Heart sounds: Normal heart sounds. No murmur heard.  No friction rub. No gallop.   Pulmonary:     Effort: Pulmonary effort is normal.     Breath sounds: Normal breath sounds.     Comments: Lungs clear to auscultation bilaterally.  Symmetric chest rise.  No wheezing, rales, rhonchi. Abdominal:     Palpations: Abdomen is soft. Abdomen is not rigid.     Tenderness: There is no abdominal tenderness. There is no guarding.     Comments: Abdomen is soft, non-distended, non-tender. No  rigidity, No guarding. No peritoneal signs.  Musculoskeletal:        General: Normal range of motion.     Cervical back: Full passive range of motion without pain.  Skin:    General: Skin is warm and dry.     Capillary Refill: Capillary refill takes less than 2 seconds.  Neurological:     Mental Status: He is alert and oriented to person, place, and time.     Comments: Cranial nerves III-XII intact Follows commands, Moves all extremities  5/5 strength to BUE and BLE  Sensation intact throughout all major nerve distributions No pronator drift. No gait abnormalities  No slurred speech. No facial droop.   Psychiatric:        Speech: Speech normal.     ED Results / Procedures / Treatments   Labs (all labs ordered are listed, but only abnormal results are displayed) Labs Reviewed  COMPREHENSIVE METABOLIC PANEL - Abnormal; Notable for the following components:      Result Value   Sodium 133 (*)    Chloride 90 (*)    Glucose, Bld 132 (*)    Total Protein 8.2 (*)    AST 141 (*)    ALT 75 (*)    Alkaline Phosphatase 179 (*)    Total Bilirubin 3.9 (*)    All other components within normal limits  CBC WITH DIFFERENTIAL/PLATELET - Abnormal; Notable for the following components:   MCV 103.3 (*)    MCH 34.8 (*)    All other components within normal limits  PROTIME-INR    EKG None  Radiology CT Head Wo Contrast  Result Date: 02/08/2020 CLINICAL DATA:  [place] last week, nausea, fatigue, light sensitivity, slight swelling and bruising over RIGHT eye EXAM: CT HEAD WITHOUT CONTRAST CT MAXILLOFACIAL WITHOUT CONTRAST TECHNIQUE: Multidetector CT  imaging of the head and maxillofacial structures were performed using the standard protocol without intravenous contrast. Multiplanar CT image reconstructions of the maxillofacial structures were also generated. Right side of face marked with BB. COMPARISON:  CT head 10/08/2008, CT maxillofacial 06/07/2016 FINDINGS: CT HEAD FINDINGS Brain: Generalized  atrophy. Normal ventricular morphology. No midline shift or mass effect. Otherwise normal appearance of brain parenchyma. No intracranial hemorrhage or evidence of acute infarction. No extra-axial fluid collections. New small extra-axial calcification at high RIGHT frontal region, 9 mm greatest diameter, questionable tiny meningioma versus new dural calcification. Vascular: No hyperdense vessels Skull: Intact Other: N/A CT MAXILLOFACIAL FINDINGS Osseous: TMJ alignment normal. Facial bones intact. Nasal septal deviation to the LEFT. No facial bone fractures. Minimal beam hardening artifacts of dental origin. Orbits: Bony orbits intact. Intraorbital soft tissue planes clear without fluid or gas. Sinuses: Paranasal sinuses, mastoid air cells, and middle ear cavities clear Soft tissues: No significant soft tissue abnormalities. IMPRESSION: Generalized atrophy. No acute intracranial abnormalities. New 9 mm diameter extra-axial calcification at high RIGHT frontal lobe, question dural calcification versus tiny meningioma. No acute facial bone abnormalities. Nasal septal deviation to the LEFT. Electronically Signed   By: Lavonia Dana M.D.   On: 02/08/2020 13:02   CT Maxillofacial Wo Contrast  Result Date: 02/08/2020 CLINICAL DATA:  [place] last week, nausea, fatigue, light sensitivity, slight swelling and bruising over RIGHT eye EXAM: CT HEAD WITHOUT CONTRAST CT MAXILLOFACIAL WITHOUT CONTRAST TECHNIQUE: Multidetector CT imaging of the head and maxillofacial structures were performed using the standard protocol without intravenous contrast. Multiplanar CT image reconstructions of the maxillofacial structures were also generated. Right side of face marked with BB. COMPARISON:  CT head 10/08/2008, CT maxillofacial 06/07/2016 FINDINGS: CT HEAD FINDINGS Brain: Generalized atrophy. Normal ventricular morphology. No midline shift or mass effect. Otherwise normal appearance of brain parenchyma. No intracranial hemorrhage or evidence  of acute infarction. No extra-axial fluid collections. New small extra-axial calcification at high RIGHT frontal region, 9 mm greatest diameter, questionable tiny meningioma versus new dural calcification. Vascular: No hyperdense vessels Skull: Intact Other: N/A CT MAXILLOFACIAL FINDINGS Osseous: TMJ alignment normal. Facial bones intact. Nasal septal deviation to the LEFT. No facial bone fractures. Minimal beam hardening artifacts of dental origin. Orbits: Bony orbits intact. Intraorbital soft tissue planes clear without fluid or gas. Sinuses: Paranasal sinuses, mastoid air cells, and middle ear cavities clear Soft tissues: No significant soft tissue abnormalities. IMPRESSION: Generalized atrophy. No acute intracranial abnormalities. New 9 mm diameter extra-axial calcification at high RIGHT frontal lobe, question dural calcification versus tiny meningioma. No acute facial bone abnormalities. Nasal septal deviation to the LEFT. Electronically Signed   By: Lavonia Dana M.D.   On: 02/08/2020 13:02    Procedures Procedures (including critical care time)  Medications Ordered in ED Medications - No data to display  ED Course  I have reviewed the triage vital signs and the nursing notes.  Pertinent labs & imaging results that were available during my care of the patient were reviewed by me and considered in my medical decision making (see chart for details).    MDM Rules/Calculators/A&P                           59 year old male who presents for evaluation after mechanical fall that occurred 9 days ago.  Since then, he has had a headache, nausea, photophobia, fatigue.  He has also had some pain around his right face and eye.  No history  of blood thinner use.  Does have history of hepatitis, transaminitis.  On initial ED arrival, he is afebrile, nontoxic-appearing.  Vital signs are stable.  He does have pain, ecchymosis noted to the right periorbital region.  He also has tenderness into the right  zygomatic arch.  Given history of fall, will plan for CT head as well as CT maxillofacial for evaluation of any facial fracture.  Given history of liver abnormalities, will plan for PT/INR.  Consider concussion versus minor head injury.  Also consider intracranial hemorrhage though lower suspicion given reassuring exam.  INR is 1.1. CBC shows no leukocytosis or anemia. CMP shows sodium 133, BUN and creatinine within normal limits.  AST is 141, ALT 75, alk phos is 179.  Total bili is 3.9.  This is slightly up from his baseline.  CT scan shows no acute intracranial abnormalities.  No evidence of acute facial fractures.  There is a new 9 mm diameter extra-axial calcification at right frontal temporal lobe.  Question cyst dural calcification versus tiny meningioma.  Discussed results with patient, including calcification seen on CT scan.  At this time, suspect the symptoms are likely related to postconcussive syndrome.  Encouraged at home supportive care measures. At this time, patient exhibits no emergent life-threatening condition that require further evaluation in ED or admission. Patient had ample opportunity for questions and discussion. All patient's questions were answered with full understanding. Strict return precautions discussed. Patient expresses understanding and agreement to plan.   Portions of this note were generated with [employer]. Dictation errors may occur despite best attempts at proofreading.  Final Clinical Impression(s) / ED Diagnoses Final diagnoses:  Minor head injury, initial encounter  Contusion of face, initial encounter  Post concussion syndrome  Cerebral calcification    Rx / DC Orders ED Discharge Orders         Ordered    ondansetron (ZOFRAN ODT) 4 MG disintegrating tablet  Every 8 hours PRN     Discontinue  Reprint     02/08/20 1324           Volanda Napoleon, PA-C 02/08/20 1348    Truddie Hidden, MD 02/08/20 1404

## 2020-02-08 NOTE — ED Triage Notes (Signed)
[name] last week  Nausea, fatigue  sensitive to light,  slight swelling and bruising over rt eye

## 2020-02-08 NOTE — Discharge Instructions (Addendum)
As we discussed, your imaging today did not show any evidence of head injury or facial bone fracture.    There was a small area that was calcified that is most likely not the cause of your symptoms. There is nothing to do about this at this time. Follow-up with your primary care doctor in the next week.   As we discussed, postconcussive syndrome can usually cause fatigue, difficulty concentrating, memory loss, nausea among other symptoms.  This can last anywhere between 2 to 6 weeks after head injury.  Try and limit the amount of physical activity you are doing for the next 2 weeks.  Additionally, he should engage in brain rest which means avoiding screen time, TV, phone use.  I provided you clinic your symptoms persist.  Return the emergency department for any worsening pain, vomiting, numbness/weakness of your arms or legs, difficulty walking or any other worsening concerning symptoms.

## 2020-03-10 DIAGNOSIS — M25511 Pain in right shoulder: Secondary | ICD-10-CM | POA: Diagnosis not present

## 2020-03-11 DIAGNOSIS — L409 Psoriasis, unspecified: Secondary | ICD-10-CM | POA: Diagnosis not present

## 2020-03-11 DIAGNOSIS — M1990 Unspecified osteoarthritis, unspecified site: Secondary | ICD-10-CM | POA: Diagnosis not present

## 2020-03-11 DIAGNOSIS — Z79899 Other long term (current) drug therapy: Secondary | ICD-10-CM | POA: Diagnosis not present

## 2020-03-11 DIAGNOSIS — L4050 Arthropathic psoriasis, unspecified: Secondary | ICD-10-CM | POA: Diagnosis not present

## 2020-03-13 DIAGNOSIS — E1165 Type 2 diabetes mellitus with hyperglycemia: Secondary | ICD-10-CM | POA: Diagnosis not present

## 2020-03-17 DIAGNOSIS — I10 Essential (primary) hypertension: Secondary | ICD-10-CM | POA: Diagnosis not present

## 2020-03-17 DIAGNOSIS — E1165 Type 2 diabetes mellitus with hyperglycemia: Secondary | ICD-10-CM | POA: Diagnosis not present

## 2020-03-21 DIAGNOSIS — M25511 Pain in right shoulder: Secondary | ICD-10-CM | POA: Diagnosis not present

## 2020-04-09 DIAGNOSIS — R946 Abnormal results of thyroid function studies: Secondary | ICD-10-CM | POA: Diagnosis not present

## 2020-04-10 ENCOUNTER — Other Ambulatory Visit (INDEPENDENT_AMBULATORY_CARE_PROVIDER_SITE_OTHER): Payer: Self-pay

## 2020-04-10 DIAGNOSIS — M25511 Pain in right shoulder: Secondary | ICD-10-CM | POA: Diagnosis not present

## 2020-04-10 MED ORDER — CEFTIN 250 MG/5ML PO SUSR: 250.0000 mg | Freq: Two times a day (BID) | ORAL | 0 refills | Status: AC

## 2020-04-15 DIAGNOSIS — R946 Abnormal results of thyroid function studies: Secondary | ICD-10-CM | POA: Diagnosis not present

## 2020-04-15 DIAGNOSIS — E041 Nontoxic single thyroid nodule: Secondary | ICD-10-CM | POA: Diagnosis not present

## 2020-04-15 DIAGNOSIS — R7989 Other specified abnormal findings of blood chemistry: Secondary | ICD-10-CM | POA: Diagnosis not present

## 2020-04-15 DIAGNOSIS — K760 Fatty (change of) liver, not elsewhere classified: Secondary | ICD-10-CM | POA: Diagnosis not present

## 2020-06-09 ENCOUNTER — Ambulatory Visit: Payer: BC Managed Care – PPO | Admitting: Gastroenterology

## 2020-06-09 ENCOUNTER — Encounter: Payer: Self-pay | Admitting: Gastroenterology

## 2020-06-09 VITALS — BP 114/66 | HR 91 | Ht 67.0 in | Wt 207.0 lb

## 2020-06-09 DIAGNOSIS — L820 Inflamed seborrheic keratosis: Secondary | ICD-10-CM | POA: Diagnosis not present

## 2020-06-09 DIAGNOSIS — R188 Other ascites: Secondary | ICD-10-CM | POA: Diagnosis not present

## 2020-06-09 DIAGNOSIS — R748 Abnormal levels of other serum enzymes: Secondary | ICD-10-CM | POA: Diagnosis not present

## 2020-06-09 DIAGNOSIS — K766 Portal hypertension: Secondary | ICD-10-CM

## 2020-06-09 DIAGNOSIS — K3189 Other diseases of stomach and duodenum: Secondary | ICD-10-CM

## 2020-06-09 DIAGNOSIS — K7460 Unspecified cirrhosis of liver: Secondary | ICD-10-CM

## 2020-06-09 NOTE — Patient Instructions (Addendum)
If you are age 59 or older, your body mass index should be between 23-30. Your Body mass index is 32.42 kg/m. If this is out of the aforementioned range listed, please consider follow up with your Primary Care Provider.  If you are age 3 or younger, your body mass index should be between 19-25. Your Body mass index is 32.42 kg/m. If this is out of the aformentioned range listed, please consider follow up with your Primary Care Provider.   You have been scheduled for an abdominal ultrasound at [facility] on 06/12/2020 at 10:15 am. Please arrive 15 minutes prior to your appointment for registration. Make certain not to have anything to eat or drink 8 hours prior to your appointment. Should you need to reschedule your appointment, please contact radiology at [phone]  This test typically takes about 30 minutes to perform.  Your provider has requested that you have lab work in January of 2022. We ask that you go to our [facility] office at [address] [place], [place] [address]. Please enter through the main entrance and go to the elevator.  Press "B" on the elevator. The lab is located at the first door on the left as you exit the elevator.  Due to recent changes in healthcare laws, you may see the results of your imaging and laboratory studies on MyChart before your provider has had a chance to review them.  We understand that in some cases there may be results that are confusing or concerning to you. Not all laboratory results come back in the same time frame and the provider may be waiting for multiple results in order to interpret others.  Please give [name] 48 hours in order for your provider to thoroughly review all the results before contacting the office for clarification of your results.

## 2020-06-09 NOTE — Progress Notes (Signed)
P  Chief Complaint:    Cirrhosis follow-up, abdominal bloating  GI History: 59 year old male with a history of psoriatic arthritis (previously treated with Enbrel, now Cosentyx), hypertension, hyperlipidemia, and history of alcohol abuse,  diagnosed with cirrhosis in 03/2019 on hospital admission.  Hospital admission 03/2019: -AST/ALT 136/53, T bili 2.1, ALP 224.  -Paracentesis, removed 1.8 L, negative for SBP, high SAAG c/w portal hypertension. -Abdominal ultrasound with moderate ascites and thick GB wall, with possible PV thrombus. -Subsequent CT demonstrates cirrhotic appearing liver, ascites but no PV thrombus.   -Repeat ultrasound with Doppler with patent vessels. -AST/ALT/ALP/TBili at d/c was 108/42/187/1.9  Cirrhosis Evaluation: - Etiology: Steatohepatitis.  Unclear if EtOH  vs NASH (several RFs to include hypertension, hyperlipidemia, central adiposity/history of obesity)  - Complications: Ascites - HCC screening: 03/2019: CT and ultrasound, AFP negative (3.6).  Repeat imaging today - Variceal screening: EGD 04/2019 - Serologic evaluation: Completed 03/2019-all negative - Viral hepatitis vaccination: UTD-HAVAb+, HBsAb+ - Flu vaccine: Declines - Liver biopsy: None to date - Medications: Lasix 40 mg, Aldactone 100 mg -MELD: 17 - Child Pugh score: B (8 points)   Endoscopic history: - Colonoscopy (01/2013, Dr. Jarold Motto): Normal.  Repeat in 10 years -EGD (04/2019, Dr. Barron Alvine): Normal esophagus, moderate PHG, normal duodenum  HPI:     Patient is a 59 y.o. male presenting to the [facility] for follow-up.  Last seen by me on 03/28/2019 for hospital follow-up of newly diagnosed cirrhosis.  States that recently with abdominal bloating and weight changes. Fluctuating weight within 5#. Bloating more so described as abdominal tightness/fullness. Can be present for a few days then remit. No identified exacerbating or alleviating factors. Not pain, and no fever or  chills. Is not compliant with low sodium diet.   Had shifting Lasix/Aldactone to QAm from QPM. Drinks 0-1 glass of wine/night.  Most recent labs were 02/08/2020.  AST/ALT mildly up trended-41/75, essentially stable ALP at 179, increased T bili to 3.9, NA 133.  Normal PLT (464), albumin (4.7), creatinine (0.83).  CBC Latest Ref Rng & Units 02/08/2020 03/09/2019 03/08/2019  WBC 4.0 - 10.5 K/uL 5.2 8.0 11.7(H)  Hemoglobin 13.0 - 17.0 g/dL 02.4 11.7(L) 12.7(L)  Hematocrit 39 - 52 % 44.2 35.0(L) 38.4(L)  Platelets 150 - 400 K/uL 164 211 258   CMP Latest Ref Rng & Units 02/08/2020 03/28/2019 03/09/2019  Glucose 70 - 99 mg/dL 097(D) 532(D) 924(Q)  BUN 6 - 20 mg/dL 10 11 10   Creatinine 0.61 - 1.24 mg/dL 6.83 4.19  Sodium 135 - 145 mmol/L 133(L) 136 136  Potassium 3.5 - 5.1 mmol/L 3.7 3.8 3.9  Chloride 98 - 111 mmol/L 90(L) 100 100  CO2 22 - 32 mmol/L 28 29 26   Calcium 8.9 - 10.3 mg/dL 8.9 9.6 7.7(L)  Total Protein 6.5 - 8.1 g/dL 8.2(H) - 6.6  Total Bilirubin 0.3 - 1.2 mg/dL 3.9(H) - 1.9(H)  Alkaline Phos 38 - 126 U/L 179(H) - 187(H)  AST 15 - 41 U/L 141(H) - 108(H)  ALT 0 - 44 U/L 75(H) - 42   INR/Prothrombin Time: 1.1/13.6   Review of systems:     No chest pain, no SOB, no fevers, no urinary sx   Past Medical History:  Diagnosis Date  . Allergy   . Anxiety   . Arthritis   . Ascites   . Diabetes (HCC)   . Elevated transaminase level   . Fatigue   . Fatty liver   . Hyperlipidemia   . Hypertension   .  Hypothyroidism   . Inguinal muscle strain   . Insomnia   . Left knee pain   . Multinodular goiter   . Obesity   . Pneumonia   . Psoriasis   . Tinnitus     Patient's surgical history, family medical history, social history, medications and allergies were all reviewed in Epic    Current Outpatient Medications  Medication Sig Dispense Refill  . azelastine (ASTELIN) 0.1 % nasal spray Place 1 spray into both nostrils 2 (two) times daily. Use in each nostril as directed    .  busPIRone (BUSPAR) 15 MG tablet Take 15 mg by mouth as directed. 1.5 tablets in the morning and 2 tablets at night    . Cholecalciferol (VITAMIN D3) 1000 UNITS CAPS Take 1 capsule by mouth daily.     . colesevelam (WELCHOL) 625 MG tablet Take 1,250 mg by mouth daily.    Marland Kitchen ezetimibe (ZETIA) 10 MG tablet Take 10 mg by mouth daily.    . fluticasone (FLONASE) 50 MCG/ACT nasal spray Place 1 spray into both nostrils daily as needed for allergies.     . furosemide (LASIX) 40 MG tablet Take 1 tablet (40 mg total) by mouth daily. 90 tablet 5  . ipratropium (ATROVENT) 0.06 % nasal spray Place 1 spray into the nose daily.    Boris Lown Oil 500 MG CAPS Take 500 mg by mouth daily.    Marland Kitchen lactulose (CHRONULAC) 10 GM/15ML solution Take 30 meal (20 g) 2-3 times a day for goal of 2-3 bowel movements a day. (Patient taking differently: Take 10 g by mouth as needed. Take 30 meal (20 g) 2-3 times a day for goal of 2-3 bowel movements a day.) 946 mL 0  . levocetirizine (XYZAL) 5 MG tablet Take 5 mg by mouth every evening.    . metFORMIN (GLUCOPHAGE) 500 MG tablet Take 500 mg by mouth every evening.    . Omeprazole Magnesium (PRILOSEC OTC PO) Take 1 tablet by mouth daily.    . ondansetron (ZOFRAN ODT) 4 MG disintegrating tablet Take 1 tablet (4 mg total) by mouth every 8 (eight) hours as needed for nausea or vomiting. 6 tablet 0  . Secukinumab (COSENTYX) 150 MG/ML SOSY Inject 150 mg into the skin every 30 (thirty) days.    . sildenafil (REVATIO) 20 MG tablet Take 1 tablet by mouth as needed.    Marland Kitchen spironolactone (ALDACTONE) 100 MG tablet Take 1 tablet (100 mg total) by mouth daily. 90 tablet 5  . thiamine 100 MG tablet Take 1 tablet (100 mg total) by mouth daily. 90 tablet 0  . vitamin E 1000 UNIT capsule Take 1,000 Units by mouth daily.    Marland Kitchen zolpidem (AMBIEN) 5 MG tablet Take 5 mg by mouth at bedtime as needed for sleep.     No current facility-administered medications for this visit.    Physical Exam:     BP 114/66    Pulse 91   Ht 5\' 7"  (1.702 m)   Wt 207 lb (93.9 kg)   BMI 32.42 kg/m   GENERAL:  Pleasant male in NAD PSYCH: : Cooperative, normal affect EENT:  conjunctiva pink, mucous membranes moist, neck supple without masses CARDIAC:  RRR, no murmur heard, no peripheral edema PULM: Normal respiratory effort, lungs CTA bilaterally, no wheezing ABDOMEN: Distended but soft. + Fluid wave. Nontender. No obvious masses, no hepatomegaly,  normal bowel sounds SKIN:  turgor, no lesions seen Musculoskeletal:  Normal muscle tone, normal strength NEURO: Alert and oriented  x 3, no focal neurologic deficits   IMPRESSION and PLAN:    1) Cirrhosis 2) Ascites 3) Elevated liver enzymes 4) capital hypertensive gastropathy  Meld now 17, largely driven by T bili 3.9.  -Discussed strict compliance with low-sodium diet. Offered referral to Nutrition Clinic, which he declined. Discussed reading labels, no adding table salt, etc. -Continue Lasix/Aldactone -Abdominal ultrasound to evaluate degree of ascites, with plan for up titration of Lasix/Aldactone -Recheck BMP 1-2 weeks after change in diuretics -Repeat liver enzymes, CBC, PT/INR in January (33-month follow-up). If MELD still elevated, plan for referral to Transplant Hepatology Clinic -Abdominal ultrasound as above for hepatoma screening, along with AFP check  -RTC in 3 to 6 months or sooner as needed  I spent 45 minutes of time, including in depth chart review, independent review of results as outlined above, communicating results with the patient directly, face-to-face time with the patient, coordinating care, ordering studies and medications as appropriate, and documentation.             Shellia Cleverly ,DO, FACG 06/09/2020, 9:28 AM

## 2020-06-12 ENCOUNTER — Ambulatory Visit (HOSPITAL_COMMUNITY): Payer: BC Managed Care – PPO

## 2020-06-16 ENCOUNTER — Ambulatory Visit (INDEPENDENT_AMBULATORY_CARE_PROVIDER_SITE_OTHER): Payer: BC Managed Care – PPO | Admitting: Otolaryngology

## 2020-06-16 ENCOUNTER — Encounter (INDEPENDENT_AMBULATORY_CARE_PROVIDER_SITE_OTHER): Payer: Self-pay | Admitting: Otolaryngology

## 2020-06-16 ENCOUNTER — Other Ambulatory Visit: Payer: Self-pay

## 2020-06-16 VITALS — Temp 97.9°F

## 2020-06-16 DIAGNOSIS — M1990 Unspecified osteoarthritis, unspecified site: Secondary | ICD-10-CM | POA: Diagnosis not present

## 2020-06-16 DIAGNOSIS — L4050 Arthropathic psoriasis, unspecified: Secondary | ICD-10-CM | POA: Diagnosis not present

## 2020-06-16 DIAGNOSIS — J310 Chronic rhinitis: Secondary | ICD-10-CM

## 2020-06-16 DIAGNOSIS — L409 Psoriasis, unspecified: Secondary | ICD-10-CM | POA: Diagnosis not present

## 2020-06-16 DIAGNOSIS — Z79899 Other long term (current) drug therapy: Secondary | ICD-10-CM | POA: Diagnosis not present

## 2020-06-16 NOTE — Progress Notes (Signed)
HPI: Andrew Patton is a 59 y.o. male who returns today for evaluation of nasal sinus problems.  He complains of a lot of postnasal drainage especially at night.  He occasionally has some bloody mucus within the drainage more on the right side.  He had a previous septoplasty and turbinate reductions following nasal fracture in 2010 by myself. He has taken antibiotics several times every year because of recurrent sinus infections. He took Ceftin back in September that seemed to help. He has had several sinus CT scans that showed mostly clear paranasal sinuses with minimal thickening along the floor of the maxillary sinuses.  No opacification or air-fluid levels noted.  He does have septal deviation to the left..  Past Medical History:  Diagnosis Date  . Allergy   . Anxiety   . Arthritis   . Ascites   . Diabetes (HCC)   . Elevated transaminase level   . Fatigue   . Fatty liver   . Hyperlipidemia   . Hypertension   . Hypothyroidism   . Inguinal muscle strain   . Insomnia   . Left knee pain   . Multinodular goiter   . Obesity   . Pneumonia   . Psoriasis   . Tinnitus    Past Surgical History:  Procedure Laterality Date  . APPENDECTOMY    . FOOT SURGERY Bilateral 1974  . NOSE SURGERY    . ROTATOR CUFF REPAIR Left    left   Social History   Socioeconomic History  . Marital status: Married    Spouse name: Not on file  . Number of children: 2  . Years of education: Not on file  . Highest education level: Not on file  Occupational History  . Occupation: Airline pilot  Tobacco Use  . Smoking status: Former Smoker    Types: Cigarettes    Quit date: 03/27/1994    Years since quitting: 26.2  . Smokeless tobacco: Never Used  Vaping Use  . Vaping Use: Never used  Substance and Sexual Activity  . Alcohol use: Yes    Alcohol/week: 2.0 standard drinks    Types: 2 Glasses of wine per week    Comment: daily  . Drug use: No  . Sexual activity: Not on file  Other Topics Concern  . Not  on file  Social History Narrative  . Not on file   Social Determinants of Health   Financial Resource Strain:   . Difficulty of Paying Living Expenses: Not on file  Food Insecurity:   . Worried About Programme researcher, broadcasting/film/video in the Last Year: Not on file  . Ran Out of Food in the Last Year: Not on file  Transportation Needs:   . Lack of Transportation (Medical): Not on file  . Lack of Transportation (Non-Medical): Not on file  Physical Activity:   . Days of Exercise per Week: Not on file  . Minutes of Exercise per Session: Not on file  Stress:   . Feeling of Stress : Not on file  Social Connections:   . Frequency of Communication with Friends and Family: Not on file  . Frequency of Social Gatherings with Friends and Family: Not on file  . Attends Religious Services: Not on file  . Active Member of Clubs or Organizations: Not on file  . Attends Banker Meetings: Not on file  . Marital Status: Not on file   Family History  Problem Relation Age of Onset  . Breast cancer Mother   .  Hyperlipidemia Mother   . Bladder Cancer Father   . Parkinson's disease Father   . Birth defects Maternal Grandfather   . Birth defects Paternal Grandmother   . Colon cancer Neg Hx   . Esophageal cancer Neg Hx   . Rectal cancer Neg Hx   . Stomach cancer Neg Hx   . Colon polyps Neg Hx    Allergies  Allergen Reactions  . Crestor [Rosuvastatin Calcium]     Elevated liver enzymes  . Mobic [Meloxicam] Other (See Comments)    Leg pain  . Penicillins Rash    Has patient had a PCN reaction causing immediate rash, facial/tongue/throat swelling, SOB or lightheadedness with hypotension: No Has patient had a PCN reaction causing severe rash involving mucus membranes or skin necrosis: No Has patient had a PCN reaction that required hospitalization: No Has patient had a PCN reaction occurring within the last 10 years: No If all of the above answers are "NO", then may proceed with Cephalosporin  use.   Prior to Admission medications   Medication Sig Start Date End Date Taking? Authorizing Provider  azelastine (ASTELIN) 0.1 % nasal spray Place 1 spray into both nostrils 2 (two) times daily. Use in each nostril as directed   Yes [provider]  busPIRone (BUSPAR) 15 MG tablet Take 15 mg by mouth as directed. 1.5 tablets in the morning and 2 tablets at night   Yes [provider]  Cholecalciferol (VITAMIN D3) 1000 UNITS CAPS Take 1 capsule by mouth daily.    Yes [provider]  colesevelam (WELCHOL) 625 MG tablet Take 1,250 mg by mouth daily.   Yes [provider]  ezetimibe (ZETIA) 10 MG tablet Take 10 mg by mouth daily.   Yes [provider]  fluticasone (FLONASE) 50 MCG/ACT nasal spray Place 1 spray into both nostrils daily as needed for allergies.  01/05/13  Yes [provider]  furosemide (LASIX) 40 MG tablet Take 1 tablet (40 mg total) by mouth daily. 06/13/19  Yes Cirigliano, Vito V, DO  ipratropium (ATROVENT) 0.06 % nasal spray Place 1 spray into the nose daily. 02/17/19  Yes [provider]  Boris Lown Oil 500 MG CAPS Take 500 mg by mouth daily.   Yes [provider]  lactulose (CHRONULAC) 10 GM/15ML solution Take 30 meal (20 g) 2-3 times a day for goal of 2-3 bowel movements a day. Patient taking differently: Take 10 g by mouth as needed. Take 30 meal (20 g) 2-3 times a day for goal of 2-3 bowel movements a day. 03/09/19  Yes Almon Hercules, MD  levocetirizine (XYZAL) 5 MG tablet Take 5 mg by mouth every evening.   Yes [provider]  metFORMIN (GLUCOPHAGE) 500 MG tablet Take 500 mg by mouth every evening.   Yes [provider]  Omeprazole Magnesium (PRILOSEC OTC PO) Take 1 tablet by mouth daily.   Yes [provider]  ondansetron (ZOFRAN ODT) 4 MG disintegrating tablet Take 1 tablet (4 mg total) by mouth every 8 (eight) hours as needed for nausea or vomiting. 02/08/20  Yes Maxwell Caul,  PA-C  Secukinumab (COSENTYX) 150 MG/ML SOSY Inject 150 mg into the skin every 30 (thirty) days.   Yes [provider]  sildenafil (REVATIO) 20 MG tablet Take 1 tablet by mouth as needed. 02/26/19  Yes [provider]  spironolactone (ALDACTONE) 100 MG tablet Take 1 tablet (100 mg total) by mouth daily. 06/13/19  Yes Cirigliano, Vito V, DO  thiamine  100 MG tablet Take 1 tablet (100 mg total) by mouth daily. 03/10/19  Yes Almon Hercules, MD  vitamin E 1000 UNIT capsule Take 1,000 Units by mouth daily.   Yes [provider]  zolpidem (AMBIEN) 5 MG tablet Take 5 mg by mouth at bedtime as needed for sleep.   Yes [provider]     Positive ROS: Otherwise negative  All other systems have been reviewed and were otherwise negative with the exception of those mentioned in the HPI and as above.  Physical Exam: Constitutional: Alert, well-appearing, no acute distress Ears: External ears without lesions or tenderness. Ear canals are clear bilaterally with intact, clear TMs.  Nasal: External nose without lesions. Septum deviated to the left.  He has some crusting along the right side of the septum and a minor concavity just above the maxillary crest..  But on removing the crusting he had no active bleeding..  Both the middle meatus regions were clear today with no clinical evidence of mucopurulent discharge. Oral: Lips and gums without lesions. Tongue and palate mucosa without lesions. Posterior oropharynx clear. Neck: No palpable adenopathy or masses Respiratory: Breathing comfortably  Skin: No facial/neck lesions or rash noted.  Procedures  Assessment: Chronic rhinitis with septal deviation to the left.  Status post previous septoplasty performed in 2010. Occasional right-sided epistaxis. History of recurrent "sinus infections"  Plan: Discussed with him concerning regular use of nasal steroid spray 1 spray twice daily.  As well as use of the saline  rinses. Suggested using mupirocin 2% ointment when he is having bleeding or crusting in the nose.  But not on a regular basis. Also gave him a prescription for Z-Pak to take if he has a sinus infection. He will follow-up as needed.   Narda Bonds, MD

## 2020-06-17 ENCOUNTER — Other Ambulatory Visit (INDEPENDENT_AMBULATORY_CARE_PROVIDER_SITE_OTHER): Payer: Self-pay

## 2020-06-17 MED ORDER — MUPIROCIN CALCIUM 2 % EX CREA: 1.0000 "application " | TOPICAL_CREAM | Freq: Two times a day (BID) | CUTANEOUS | 0 refills | Status: DC

## 2020-06-19 ENCOUNTER — Other Ambulatory Visit: Payer: Self-pay

## 2020-06-19 ENCOUNTER — Ambulatory Visit (HOSPITAL_COMMUNITY)
Admission: RE | Admit: 2020-06-19 | Discharge: 2020-06-19 | Disposition: A | Discharge status: Home / Self Care | Payer: BC Managed Care – PPO | Source: Ambulatory Visit | Attending: Gastroenterology | Admitting: Gastroenterology

## 2020-06-19 DIAGNOSIS — Q8909 Congenital malformations of spleen: Secondary | ICD-10-CM | POA: Diagnosis not present

## 2020-06-19 DIAGNOSIS — K7689 Other specified diseases of liver: Secondary | ICD-10-CM | POA: Diagnosis not present

## 2020-06-19 DIAGNOSIS — K7460 Unspecified cirrhosis of liver: Secondary | ICD-10-CM | POA: Diagnosis not present

## 2020-06-19 DIAGNOSIS — R188 Other ascites: Secondary | ICD-10-CM | POA: Diagnosis not present

## 2020-06-30 DIAGNOSIS — J309 Allergic rhinitis, unspecified: Secondary | ICD-10-CM | POA: Diagnosis not present

## 2020-07-02 DIAGNOSIS — M7532 Calcific tendinitis of left shoulder: Secondary | ICD-10-CM | POA: Diagnosis not present

## 2020-07-06 ENCOUNTER — Other Ambulatory Visit: Payer: Self-pay | Admitting: Gastroenterology

## 2020-07-06 DIAGNOSIS — R748 Abnormal levels of other serum enzymes: Secondary | ICD-10-CM

## 2020-07-06 DIAGNOSIS — K2970 Gastritis, unspecified, without bleeding: Secondary | ICD-10-CM

## 2020-07-06 DIAGNOSIS — K7460 Unspecified cirrhosis of liver: Secondary | ICD-10-CM

## 2020-07-06 DIAGNOSIS — K766 Portal hypertension: Secondary | ICD-10-CM

## 2020-07-06 DIAGNOSIS — K3189 Other diseases of stomach and duodenum: Secondary | ICD-10-CM

## 2020-07-06 DIAGNOSIS — R188 Other ascites: Secondary | ICD-10-CM

## 2020-07-10 DIAGNOSIS — E1165 Type 2 diabetes mellitus with hyperglycemia: Secondary | ICD-10-CM | POA: Diagnosis not present

## 2020-07-10 DIAGNOSIS — I10 Essential (primary) hypertension: Secondary | ICD-10-CM | POA: Diagnosis not present

## 2020-07-10 DIAGNOSIS — R7989 Other specified abnormal findings of blood chemistry: Secondary | ICD-10-CM | POA: Diagnosis not present

## 2020-07-10 DIAGNOSIS — Z125 Encounter for screening for malignant neoplasm of prostate: Secondary | ICD-10-CM | POA: Diagnosis not present

## 2020-07-10 DIAGNOSIS — E8881 Metabolic syndrome: Secondary | ICD-10-CM | POA: Diagnosis not present

## 2020-07-10 DIAGNOSIS — R946 Abnormal results of thyroid function studies: Secondary | ICD-10-CM | POA: Diagnosis not present

## 2020-07-10 DIAGNOSIS — Z0000 Encounter for general adult medical examination without abnormal findings: Secondary | ICD-10-CM | POA: Diagnosis not present

## 2020-07-10 DIAGNOSIS — R232 Flushing: Secondary | ICD-10-CM | POA: Diagnosis not present

## 2020-07-10 DIAGNOSIS — E7800 Pure hypercholesterolemia, unspecified: Secondary | ICD-10-CM | POA: Diagnosis not present

## 2020-07-14 DIAGNOSIS — R9089 Other abnormal findings on diagnostic imaging of central nervous system: Secondary | ICD-10-CM | POA: Diagnosis not present

## 2020-07-14 DIAGNOSIS — I10 Essential (primary) hypertension: Secondary | ICD-10-CM | POA: Diagnosis not present

## 2020-07-14 DIAGNOSIS — Z0000 Encounter for general adult medical examination without abnormal findings: Secondary | ICD-10-CM | POA: Diagnosis not present

## 2020-07-14 DIAGNOSIS — Z1212 Encounter for screening for malignant neoplasm of rectum: Secondary | ICD-10-CM | POA: Diagnosis not present

## 2020-07-14 DIAGNOSIS — J309 Allergic rhinitis, unspecified: Secondary | ICD-10-CM | POA: Diagnosis not present

## 2020-07-15 DIAGNOSIS — R7989 Other specified abnormal findings of blood chemistry: Secondary | ICD-10-CM | POA: Diagnosis not present

## 2020-07-15 DIAGNOSIS — E041 Nontoxic single thyroid nodule: Secondary | ICD-10-CM | POA: Diagnosis not present

## 2020-07-15 DIAGNOSIS — K760 Fatty (change of) liver, not elsewhere classified: Secondary | ICD-10-CM | POA: Diagnosis not present

## 2020-07-15 DIAGNOSIS — R946 Abnormal results of thyroid function studies: Secondary | ICD-10-CM | POA: Diagnosis not present

## 2020-08-09 DIAGNOSIS — R55 Syncope and collapse: Secondary | ICD-10-CM | POA: Diagnosis not present

## 2020-08-13 DIAGNOSIS — I10 Essential (primary) hypertension: Secondary | ICD-10-CM | POA: Diagnosis not present

## 2020-08-13 DIAGNOSIS — R410 Disorientation, unspecified: Secondary | ICD-10-CM | POA: Diagnosis not present

## 2020-08-13 DIAGNOSIS — K7460 Unspecified cirrhosis of liver: Secondary | ICD-10-CM | POA: Diagnosis not present

## 2020-08-13 DIAGNOSIS — E7800 Pure hypercholesterolemia, unspecified: Secondary | ICD-10-CM | POA: Diagnosis not present

## 2020-08-13 DIAGNOSIS — E1165 Type 2 diabetes mellitus with hyperglycemia: Secondary | ICD-10-CM | POA: Diagnosis not present

## 2020-08-21 DIAGNOSIS — Z6832 Body mass index (BMI) 32.0-32.9, adult: Secondary | ICD-10-CM | POA: Diagnosis not present

## 2020-08-21 DIAGNOSIS — D329 Benign neoplasm of meninges, unspecified: Secondary | ICD-10-CM | POA: Diagnosis not present

## 2020-08-21 DIAGNOSIS — R413 Other amnesia: Secondary | ICD-10-CM | POA: Diagnosis not present

## 2020-08-21 DIAGNOSIS — R030 Elevated blood-pressure reading, without diagnosis of hypertension: Secondary | ICD-10-CM | POA: Diagnosis not present

## 2020-08-22 ENCOUNTER — Other Ambulatory Visit: Payer: Self-pay | Admitting: Internal Medicine

## 2020-08-22 DIAGNOSIS — R410 Disorientation, unspecified: Secondary | ICD-10-CM

## 2020-08-26 ENCOUNTER — Other Ambulatory Visit (INDEPENDENT_AMBULATORY_CARE_PROVIDER_SITE_OTHER): Payer: BC Managed Care – PPO

## 2020-08-26 DIAGNOSIS — R188 Other ascites: Secondary | ICD-10-CM

## 2020-08-26 DIAGNOSIS — K7460 Unspecified cirrhosis of liver: Secondary | ICD-10-CM

## 2020-08-26 LAB — COMPREHENSIVE METABOLIC PANEL
ALT: 41 U/L (ref 0–53)
AST: 75 U/L — ABNORMAL HIGH (ref 0–37)
Albumin: 3.9 g/dL (ref 3.5–5.2)
Alkaline Phosphatase: 144 U/L — ABNORMAL HIGH (ref 39–117)
BUN: 10 mg/dL (ref 6–23)
CO2: 30 mEq/L (ref 19–32)
Calcium: 9.2 mg/dL (ref 8.4–10.5)
Chloride: 96 mEq/L (ref 96–112)
Creatinine, Ser: 0.87 mg/dL (ref 0.40–1.50)
GFR: 94.68 mL/min (ref >60.00)
Glucose, Bld: 144 mg/dL — ABNORMAL HIGH (ref 70–99)
Potassium: 4.1 mEq/L (ref 3.5–5.1)
Sodium: 134 mEq/L — ABNORMAL LOW (ref 135–145)
Total Bilirubin: 5.1 mg/dL — ABNORMAL HIGH (ref 0.2–1.2)
Total Protein: 7.7 g/dL (ref 6.0–8.3)

## 2020-08-26 LAB — CBC WITH DIFFERENTIAL/PLATELET
Basophils Absolute: 0.1 10*3/uL (ref 0.0–0.1)
Basophils Relative: 1.2 % (ref 0.0–3.0)
Eosinophils Absolute: 0.2 10*3/uL (ref 0.0–0.7)
Eosinophils Relative: 2.9 % (ref 0.0–5.0)
HCT: 40.8 % (ref 39.0–52.0)
Hemoglobin: 14.3 g/dL (ref 13.0–17.0)
Lymphocytes Relative: 24.6 % (ref 12.0–46.0)
Lymphs Abs: 1.7 10*3/uL (ref 0.7–4.0)
MCHC: 35 g/dL (ref 30.0–36.0)
MCV: 100.2 fl — ABNORMAL HIGH (ref 78.0–100.0)
Monocytes Absolute: 0.9 10*3/uL (ref 0.1–1.0)
Monocytes Relative: 13.1 % — ABNORMAL HIGH (ref 3.0–12.0)
Neutro Abs: 4 10*3/uL (ref 1.4–7.7)
Neutrophils Relative %: 58.2 % (ref 43.0–77.0)
Platelets: 148 10*3/uL — ABNORMAL LOW (ref 150.0–400.0)
RBC: 4.07 Mil/uL — ABNORMAL LOW (ref 4.22–5.81)
RDW: 15.5 % (ref 11.5–15.5)
WBC: 6.9 10*3/uL (ref 4.0–10.5)

## 2020-08-27 LAB — AFP TUMOR MARKER: AFP-Tumor Marker: 4.8 ng/mL (ref <6.1)

## 2020-08-27 LAB — PROTIME-INR
INR: 1.3 ratio — ABNORMAL HIGH (ref 0.8–1.0)
Prothrombin Time: 14.7 s — ABNORMAL HIGH (ref 9.6–13.1)

## 2020-08-28 ENCOUNTER — Ambulatory Visit: Payer: BC Managed Care – PPO | Admitting: Gastroenterology

## 2020-08-28 ENCOUNTER — Encounter: Payer: Self-pay | Admitting: Gastroenterology

## 2020-08-28 VITALS — BP 112/60 | HR 82 | Ht 67.0 in | Wt 210.0 lb

## 2020-08-28 DIAGNOSIS — K766 Portal hypertension: Secondary | ICD-10-CM | POA: Diagnosis not present

## 2020-08-28 DIAGNOSIS — F05 Delirium due to known physiological condition: Secondary | ICD-10-CM

## 2020-08-28 DIAGNOSIS — K7460 Unspecified cirrhosis of liver: Secondary | ICD-10-CM

## 2020-08-28 DIAGNOSIS — K3189 Other diseases of stomach and duodenum: Secondary | ICD-10-CM

## 2020-08-28 DIAGNOSIS — R404 Transient alteration of awareness: Secondary | ICD-10-CM

## 2020-08-28 DIAGNOSIS — R188 Other ascites: Secondary | ICD-10-CM

## 2020-08-28 DIAGNOSIS — R748 Abnormal levels of other serum enzymes: Secondary | ICD-10-CM

## 2020-08-28 NOTE — Patient Instructions (Signed)
If you are age 60 or older, your body mass index should be between 23-30. Your Body mass index is 32.89 kg/m. If this is out of the aforementioned range listed, please consider follow up with your Primary Care Provider.  If you are age 81 or younger, your body mass index should be between 19-25. Your Body mass index is 32.89 kg/m. If this is out of the aformentioned range listed, please consider follow up with your Primary Care Provider.    Follow up in 3 months  It was a pleasure to see you today!  Vito Cirigliano, D.O.

## 2020-08-28 NOTE — Progress Notes (Signed)
P  Chief Complaint:    Cirrhosis, confusion  GI History: 60 year old male with a history of psoriatic arthritis (previously treated with Enbrel, now Cosentyx), hypertension, hyperlipidemia, and history of alcohol abuse, diagnosed with cirrhosis in 03/2019 on hospital admission (see prior notes for summary of hospitalization work-up).   Cirrhosis Evaluation: -Etiology: Steatohepatitis; suspected 2/2 EtOH, but also RFs for NASHto include hypertension, hyperlipidemia, central adiposity/history of obesity -Complications: Ascites, portal hypertensive gastropathy -HCC screening: RUQ [name] 06/2020: Cirrhosis, no HCC.  AFP normal 08/2020 -Variceal screening: EGD 04/2019 (no varices) -Serologic evaluation: Completed 03/2019-all negative -Viral hepatitis vaccination: UTD; HAVAb+, HBsAb+ -Flu vaccine: Declines -Liver biopsy: None todate -Medications: Lasix 40 mg, Aldactone 100 mg, lactulose (not taking) -MELD:18 -Child Pugh score:B (8 points)   Endoscopic history: - Colonoscopy (01/2013, Dr. Jarold Motto): Normal. Repeat in 10 years -EGD (04/2019, Dr. Barron Alvine): Normal esophagus, moderate PHG, normal duodenum  HPI:     Patient is a 60 y.o. male presenting to the [facility]astroenterology Clinic for follow-up.  Last seen by me on 06/09/2020.  Main issue today is fogginess and intermittent confusion. He presents with his wife who assists in history, particular surrounding episodic AMS.  Has had few episodes of delirium/AMS. Most pronounced episode was 2020/08/28. Was visiting with friends at a winery and after drinking two sips of Prosecco, he got up started wandering around the winery, removed his shirt, and was "not there" for least 25 minutes then woke up in the office.  Was disoriented, agitated, and confused.  EMS arrived and BG was 242.  Was brought to the ER, but he decided to go home without being seen by physician due to prolonged wait times and feeling better.  Has had a few  episodes that were less pronounced per wife. She states he will be confused, but no weakness. Symptoms can last a few hours and returns to baseline.  Seen by his PCM on 08/13/2020.  CBC and ammonia normal that day.  Was evaluated with EKG, and sent for MRI brain along with referral to Neurology. Was actually seen by Neurosurgery for evaluation of known meningioma. No plan for surgical intervention, but yearly surveillance. Appt with Neurology next week.   He still drinks a couple glasses of wine/Prosecco at night.   08/26/2020: -Normal WBC, H/H.  PLT 148.  MCV 100.2 (down from previous) -NA 134, BG 144, T bili 5.1 (previously 3.9), AST/ALT 75/41, ALP 144, albumin 3.9, creatinine 0.87 -INR 1.3   Review of systems:     No chest pain, no SOB, no fevers, no urinary sx   Past Medical History:  Diagnosis Date  . Allergy   . Anxiety   . Arthritis   . Ascites   . Diabetes (HCC)   . Elevated transaminase level   . Fatigue   . Fatty liver   . Hyperlipidemia   . Hypertension   . Hypothyroidism   . Inguinal muscle strain   . Insomnia   . Left knee pain   . Multinodular goiter   . Obesity   . Pneumonia   . Psoriasis   . Tinnitus     Patient's surgical history, family medical history, social history, medications and allergies were all reviewed in Epic    Current Outpatient Medications  Medication Sig Dispense Refill  . azelastine (ASTELIN) 0.1 % nasal spray Place 1 spray into both nostrils 2 (two) times daily. Use in each nostril as directed    . busPIRone (BUSPAR) 15 MG tablet Take 30 mg by mouth  2 (two) times daily.    . Cholecalciferol (VITAMIN D3) 1000 UNITS CAPS Take 1 capsule by mouth daily.     . colesevelam (WELCHOL) 625 MG tablet Take 1,250 mg by mouth daily.    Marland Kitchen ezetimibe (ZETIA) 10 MG tablet Take 10 mg by mouth daily.    . fluticasone (FLONASE) 50 MCG/ACT nasal spray Place 1 spray into both nostrils daily as needed for allergies.     . furosemide (LASIX) 40 MG tablet TAKE  1 TABLET ONCE DAILY. 90 tablet 0  . HUMIRA 40 MG/0.4ML PSKT Inject into the skin every 14 (fourteen) days.    Marland Kitchen ipratropium (ATROVENT) 0.06 % nasal spray Place 1 spray into the nose daily.    Boris Lown Oil 500 MG CAPS Take 500 mg by mouth daily.    Marland Kitchen lactulose (CHRONULAC) 10 GM/15ML solution Take 30 meal (20 g) 2-3 times a day for goal of 2-3 bowel movements a day. (Patient taking differently: Take 10 g by mouth as needed. Take 30 meal (20 g) 2-3 times a day for goal of 2-3 bowel movements a day.) 946 mL 0  . levocetirizine (XYZAL) 5 MG tablet Take 5 mg by mouth every evening.    Marland Kitchen levothyroxine (SYNTHROID) 50 MCG tablet Take 50 mcg by mouth daily.    . metFORMIN (GLUCOPHAGE) 500 MG tablet Take 500 mg by mouth every evening.    . mupirocin cream (BACTROBAN) 2 % Apply 1 application topically 2 (two) times daily. Use BID , 1 week PRN for nasal crusting or scabbing 15 g 0  . Omeprazole Magnesium (PRILOSEC OTC PO) Take 1 tablet by mouth daily.    . ondansetron (ZOFRAN ODT) 4 MG disintegrating tablet Take 1 tablet (4 mg total) by mouth every 8 (eight) hours as needed for nausea or vomiting. 6 tablet 0  . sildenafil (REVATIO) 20 MG tablet Take 1 tablet by mouth as needed.    Marland Kitchen spironolactone (ALDACTONE) 100 MG tablet TAKE 1 TABLET ONCE DAILY. 90 tablet 0  . thiamine 100 MG tablet Take 1 tablet (100 mg total) by mouth daily. 90 tablet 0  . vitamin E 1000 UNIT capsule Take 1,000 Units by mouth daily.    Marland Kitchen zolpidem (AMBIEN) 5 MG tablet Take 5 mg by mouth at bedtime as needed for sleep.     No current facility-administered medications for this visit.    Physical Exam:     BP 112/60   Pulse 82   Ht 5\' 7"  (1.702 m)   Wt 210 lb (95.3 kg)   BMI 32.89 kg/m   GENERAL:  Pleasant male in NAD PSYCH: : Cooperative, normal affect EENT:  conjunctiva pink, mucous membranes moist, neck supple without masses CARDIAC:  RRR, no murmur heard, no peripheral edema PULM: Normal respiratory effort, lungs CTA  bilaterally, no wheezing ABDOMEN:  Nondistended, soft, nontender. No obvious masses, no hepatomegaly,  normal bowel sounds SKIN:  turgor, no lesions seen Musculoskeletal:  Normal muscle tone, normal strength NEURO: Alert and oriented x 3, no focal neurologic deficits. No asterixis   IMPRESSION and PLAN:    1) Cirrhosis 2) Ascites 3) Portal hypertensive gastropathy  -Again discussed underlying diagnosis of cirrhosis. Presumed 2/2 EtOH. Unfortunately, still drinks a couple glasses of wine/Prosecco each night. We discussed complete abstinence of all alcohol at length today, with strong recommendation that he quit completely -Continue Lasix/Aldactone -UTD on HCC screening -Low-sodium diet -Elevated MELD (19), largely driven by hyperbilirubinemia and mild coagulopathy. Discussed that this would be appropriate  for referral to Transplant Hepatology, but in light of his continued EtOH use, would not be considered a transplant candidate. Again, strongly counseled on complete cessation of all alcohol, and pending repeat labs, if still applicable, would send referral to Transplant Hepatology  4) Confusion/delirium -Episodic delirium as above. Query hepatic encephalopathy. Ammonia checked, but while he was mentating normally. Recommended to patient and wife to be seen at urgent care or ER if symptoms recur so he can have a full battery of labs, to include ammonia, BG, etc. -Recommended he restart lactulose -Has appointment in Neurology next week -Known history of meningioma and was recently seen by Neurosurgery with plan for continued surveillance per patient   - RTC in 3-4 months or sooner prn  I spent 40 minutes of time, including in depth chart review, independent review of results as outlined above, communicating results with the patient directly, face-to-face time with the patient, coordinating care, ordering studies and medications as appropriate, and documentation.       Shellia Cleverly  ,DO, FACG 08/28/2020, 11:24 AM

## 2020-09-01 ENCOUNTER — Other Ambulatory Visit: Payer: Self-pay | Admitting: General Surgery

## 2020-09-01 ENCOUNTER — Telehealth: Payer: Self-pay | Admitting: Gastroenterology

## 2020-09-01 NOTE — Telephone Encounter (Signed)
Patients wife called and said the Lactulose solution they have has expired and is seeking for refill

## 2020-09-01 NOTE — Telephone Encounter (Signed)
The patients wife is calling for a refill on his Lactulose rx. It is expired and I am unsure of the dosage you would like him to have at this time. Please advise and I will send it to the pharmacy.

## 2020-09-03 MED ORDER — LACTULOSE 20 GM/30ML PO SOLN: 30.0000 mL | Freq: Three times a day (TID) | ORAL | 0 refills | Status: AC

## 2020-09-03 NOTE — Progress Notes (Unsigned)
[facility]    Provider:  Dr Lucia GaskinsAhern Requesting Provider: Dawley, Alan Mulderroy C, DO Primary Care Provider:  [name], Walter, MD  CC:  Memory loss  HPI:  Andrew Patton is a 60 y.o. male here as requested by Dawley, Alan Mulderroy C, DO([facility]) for memory loss.  Past medical history of diabetes, elevated transaminases secondary to fatty liver, hyperlipidemia, hypothyroidism, insomnia, hypertension, obesity, psoriasis, fatigue, osteoarthritis, diabetes with hypertension and hypercholesterolemia.  I requested his pcp's notes from Dr. Renne CriglerPharr as well.   Patient is here with his wife who provides most information, he is having episodes of confusion, he had a concussion in July 2021 and since then he had several episodes where he would not know where he is going, wife would make him pull over. A "glazed look" in his eye. They started possibly in September of 2021, he had an episode in a restaurant where he got a glazed look in his face, he was confused and disoriented, lasting 5-10 minutes, slowly resolve, he says he remembers these. This year January 8th they were at a winery and they were there at 12:15 maybe been there 20-25 minutes and sitting talking at a table and he was eating nuts, he had not had anything to eat yet, he took his shirt off, he said he was going to bed, he wants to go lay down, didn;t know where he was, thought he was at home, they got him into a chair in the office and wanted to get up and wander. They checked heart rate and pulse, it was fine, no loss of consciousness, he would not answer questions such as "do you know who I am", no focal weakness at the time, no speech slurring, they called the EMT and his glucose was 242.  He has had a couple other smaller episodes where he was more attentive but disoriented still. The one on January 8th of this year lasted 25 minutes and slowly resolved like "waking up from a dream". His ammonia level was elevated. The last episode was  on the 18th of January, they went to the store and he did not look right, he had the grocery cart and kept running into the sides.     I reviewed Dr. Mattie Marlinawley's notes:  he is a 60 year old male with history of prediabetes, liver disease, arthritis that complains of multiple episodes of disorientation and memory last, he had a traumatic brain injury in July 2021 but acute imaging CT of the brain showed no acute findings, since then he has had multiple episodes most recently earlier this month of 25 to 30 minutes of disorientation where his wife noticed he was staring off and not his normal self, no focal seizure activity, no loss of consciousness, no focal weakness, numbness or tingling, I also reviewed Dr. Dorthula NettlesFarr's notes, it appears patient was seen on August 13, 2020, patient reported during that visit that he was somewhere and had not eaten that morning, he had a glass of [place], he was eating nuts and berries, when he got up and started walking he wandered around the winery, was completely "not there" for 25 minutes, he woke up in the office, he had buspirone, Lasix and WelChol, per his wife at 1255 he stood up and took off his shirt, very disoriented, agitated and confused not verbal no seizures, EMT arrived and blood sugar was 242, was not seen by the doctor at the emergency room, EKG was reported to be normal, he went to the ER  but decided to skip being seen by MD due to the weight.  Labs drawn the same day included a CBC which was unremarkable, it appears ammonia was also taken, he has elevated transaminases secondary to fatty liver, CMP that was collected July 10, 2020 showed an elevated glucose, elevated AST at 94 glucose 135, BUN 9, creatinine 0.9 otherwise unremarkable, hemoglobin A1c July 10, 2020 7.5, free T4 July 10, 2020 1.47, LDL 180, TSH elevated 6.47 July 10, 2020.   I also reviewed notes from the emergency room in July 2021 where he presented for evaluation of headache, nausea,  fatigue, photophobia, pain to the right eye after mechanical fall 9 days prior, he was walking in the middle the night from the bedroom to the bathroom when he tripped and fell hitting his face and head against the dresser.  CT of the head did not show anything acute.   Reviewed notes, labs and imaging from outside physicians, which showed:  CT of the head February 08, 2020: Showed generalized mild atrophy, possibly a 9 mm right frontal lobe calcification versus tiny meningioma, personally reviewed images and agree with findings. Review of Systems: Patient complains of symptoms per HPI as well as the following symptoms: episodes of confusion, liver disease with elevated ammonia, hypothyroidism, fatigue, concussion. Pertinent negatives and positives per HPI. All others negative.   Social History   Socioeconomic History  . Marital status: Married    Spouse name: Not on file  . Number of children: 2  . Years of education: Not on file  . Highest education level: Associate degree: academic program  Occupational History  . Occupation: Airline pilot  Tobacco Use  . Smoking status: Former Smoker    Types: Cigarettes    Quit date: 03/27/1994    Years since quitting: 26.4  . Smokeless tobacco: Former Neurosurgeon  . Tobacco comment: did chew as a teenager  Vaping Use  . Vaping Use: Never used  Substance and Sexual Activity  . Alcohol use: Not Currently    Alcohol/week: 2.0 standard drinks    Types: 2 Glasses of wine per week    Comment: daily  . Drug use: No  . Sexual activity: Not on file  Other Topics Concern  . Not on file  Social History Narrative   Lives at home with wife    Right handed   Caffeine: maybe 1 cup/week   Social Determinants of Health   Financial Resource Strain: Not on file  Food Insecurity: Not on file  Transportation Needs: Not on file  Physical Activity: Not on file  Stress: Not on file  Social Connections: Not on file  Intimate Partner Violence: Not on file    Family  History  Problem Relation Age of Onset  . Breast cancer Mother   . Hyperlipidemia Mother   . Other Mother        ? form of parkinson's   . Bladder Cancer Father   . Parkinson's disease Father   . Memory loss Maternal Grandmother        in her 90s  . Birth defects Maternal Grandfather   . Birth defects Paternal Grandmother   . Colon cancer Neg Hx   . Esophageal cancer Neg Hx   . Rectal cancer Neg Hx   . Stomach cancer Neg Hx   . Colon polyps Neg Hx   . Dementia Neg Hx   . Alzheimer's disease Neg Hx     Past Medical History:  Diagnosis Date  . Allergy   .  Anxiety   . Arthritis   . Ascites   . Diabetes (HCC)   . Elevated transaminase level   . Fatigue   . Fatty liver   . Hyperlipidemia   . Hypertension    "under control", not on any blood pressure medication, has lost 60 lbs  . Hypothyroidism    on synthroid  . Inguinal muscle strain   . Insomnia   . Left knee pain    "long time ago"  . Multinodular goiter   . Obesity   . Pneumonia   . Psoriasis   . Tinnitus     Patient Active Problem List   Diagnosis Date Noted  . Abdominal distention   . Diarrhea   . Rectal bleeding 03/08/2019  . Abnormal liver function 03/08/2019  . Portal vein thrombosis 03/07/2019  . Ascites of liver 03/07/2019  . Abdominal pain 03/07/2019  . Unspecified essential hypertension 04/29/2014  . Other and unspecified hyperlipidemia 04/29/2014    Past Surgical History:  Procedure Laterality Date  . APPENDECTOMY  1976  . FOOT SURGERY Bilateral 1975  . NOSE SURGERY    . ROTATOR CUFF REPAIR Left    left  . SHOULDER SURGERY Right 12/20/2019   [facility]     Current Outpatient Medications  Medication Sig Dispense Refill  . azelastine (ASTELIN) 0.1 % nasal spray Place 1 spray into both nostrils 2 (two) times daily. Use in each nostril as directed    . busPIRone (BUSPAR) 15 MG tablet Take 30 mg by mouth 2 (two) times daily.    . Cholecalciferol (VITAMIN D3) 1000 UNITS  CAPS Take 1 capsule by mouth daily.     . colesevelam (WELCHOL) 625 MG tablet Take 1,250 mg by mouth daily.    Marland Kitchen ezetimibe (ZETIA) 10 MG tablet Take 10 mg by mouth daily.    . fluticasone (FLONASE) 50 MCG/ACT nasal spray Place 1 spray into both nostrils daily as needed for allergies.     . furosemide (LASIX) 40 MG tablet TAKE 1 TABLET ONCE DAILY. 90 tablet 0  . HUMIRA 40 MG/0.4ML PSKT Inject into the skin every 14 (fourteen) days.    Marland Kitchen ipratropium (ATROVENT) 0.06 % nasal spray Place 1 spray into the nose daily.    Boris Lown Oil 500 MG CAPS Take 500 mg by mouth daily.    . Lactulose 20 GM/30ML SOLN Take 30 mLs (20 g total) by mouth in the morning, at noon, and at bedtime. Please titrate this medication to 2-3 soft stools daily, not diarrhea. (Patient taking differently: Take 15 mLs by mouth. Please titrate this medication to 2-3 soft stools daily, not diarrhea.  Take 1-2 times a day.) 2700 mL 0  . levETIRAcetam (KEPPRA) 500 MG tablet Take 1 tablet (500 mg total) by mouth every 12 (twelve) hours. 60 tablet 3  . levocetirizine (XYZAL) 5 MG tablet Take 5 mg by mouth every evening.    Marland Kitchen levothyroxine (SYNTHROID) 50 MCG tablet Take 50 mcg by mouth daily.    . metFORMIN (GLUCOPHAGE) 500 MG tablet Take 500 mg by mouth every evening.    . mupirocin cream (BACTROBAN) 2 % Apply 1 application topically 2 (two) times daily. Use BID , 1 week PRN for nasal crusting or scabbing 15 g 0  . Omeprazole Magnesium (PRILOSEC OTC PO) Take 1 tablet by mouth daily.    . ondansetron (ZOFRAN ODT) 4 MG disintegrating tablet Take 1 tablet (4 mg total) by mouth every 8 (eight) hours as needed for nausea  or vomiting. 6 tablet 0  . sildenafil (REVATIO) 20 MG tablet Take 1 tablet by mouth as needed.    Marland Kitchen spironolactone (ALDACTONE) 100 MG tablet TAKE 1 TABLET ONCE DAILY. 90 tablet 0  . thiamine 100 MG tablet Take 1 tablet (100 mg total) by mouth daily. 90 tablet 0  . vitamin E 1000 UNIT capsule Take 1,000 Units by mouth daily.     Marland Kitchen zolpidem (AMBIEN) 5 MG tablet Take 5 mg by mouth at bedtime as needed for sleep.     No current facility-administered medications for this visit.    Allergies as of 09/04/2020 - Review Complete 09/04/2020  Allergen Reaction Noted  . Crestor [rosuvastatin calcium]  03/15/2019  . Mobic [meloxicam] Other (See Comments) 03/08/2019  . Penicillins Rash 01/24/2013    Vitals: BP 108/69 (BP Location: Right Arm, Patient Position: Sitting)   Pulse 70   Ht 5\' 7"  (1.702 m)   Wt 206 lb (93.4 kg)   BMI 32.26 kg/m  Last Weight:  Wt Readings from Last 1 Encounters:  09/04/20 206 lb (93.4 kg)   Last Height:   Ht Readings from Last 1 Encounters:  09/04/20 5\' 7"  (1.702 m)     Physical exam: Exam: Gen: NAD, conversant, well nourised, obese, well groomed                     CV: RRR, no MRG. No Carotid Bruits. No peripheral edema, warm, nontender Eyes: Conjunctivae clear without exudates or hemorrhage  Neuro: Detailed Neurologic Exam  Speech:    Speech is normal; fluent and spontaneous with normal comprehension.  Cognition:    The patient is oriented to person, place, and time;     recent and remote memory intact;     language fluent;     normal attention, concentration,     fund of knowledge Cranial Nerves:    The pupils are equal, round, and reactive to light. Pupils too small to visualize fundi. Visual fields are full to finger confrontation. Extraocular movements are intact. Trigeminal sensation is intact and the muscles of mastication are normal. The face is symmetric. The palate elevates in the midline. Hearing intact. Voice is normal. Shoulder shrug is normal. The tongue has normal motion without fasciculations.   Coordination:    Normal finger to nose and heel to shin.   Gait:   Normal native gait  Motor Observation:    No asymmetry, no atrophy, and no involuntary movements noted. Tone:    Normal muscle tone.    Posture:    Posture is normal. normal erect     Strength:    Strength is V/V in the upper and lower limbs.      Sensation: intact to LT     Reflex Exam:  DTR's:    Deep tendon reflexes in the upper and lower extremities are normal bilaterally.   Toes:    The toes are downgoing bilaterally.   Clonus:    Clonus is absent.    Assessment/Plan:  60 year old with witnessed, sterotyped episodes of alteration of awareness and confusion after TBI and concussion. Sounds like  "complex partial" seizures now called focal impaired awareness. However cannot rule her hyperglycemia (glucose was 242, not severe but abnormal for patient who usually reports much lower glucose) or hyperammonemia due to his liver condition. Will also recheck tsh and free t4. We had a long discussion today about seizures, types of seizures, the differential diagnosis for his episodes, not driving for  6 months, risk of seizures including sudden unexplained death in epilepsy patients, safety at this time.  We also had a long discussion about seizure medications and whether to start them on not, at this time we will start Keppra 500 mg twice a day and start a seizure work-up as below.   MRI of the brain w/wo contrast seizure protocol  Bloodwork today Start Keppra EEG in the office  Possibly an extended EEG at home or in the hospital (send thyroid labs to Dr. Gayla Medicus at [facility])  Orders Placed This Encounter  Procedures  . MR BRAIN W WO CONTRAST  . CBC with Differential/Platelets  . Comprehensive metabolic panel  . Ammonia  . Hemoglobin A1c  . TSH  . T4, Free  . EEG   Meds ordered this encounter  Medications  . levETIRAcetam (KEPPRA) 500 MG tablet    Sig: Take 1 tablet (500 mg total) by mouth every 12 (twelve) hours.    Dispense:  60 tablet    Refill:  3    Cc: Dawley, Alan Mulder, DO,  [name], MD  Naomie Dean, MD  [facility] [address] [place], [place] [address]  Phone [phone] Fax  [phone]

## 2020-09-03 NOTE — Telephone Encounter (Signed)
Lactulose 10 gm/15 mL. Take 30 mL (20 g) 2-3 times a day for goal of 2-3 bowel movements a day. 90 day supply with RF5.   As above, please advise to titrate to 2-3 soft stools (not diarrhea) daily as we discussed in clinic. Thanks.

## 2020-09-04 ENCOUNTER — Ambulatory Visit: Payer: BC Managed Care – PPO | Admitting: Neurology

## 2020-09-04 ENCOUNTER — Encounter: Payer: Self-pay | Admitting: Neurology

## 2020-09-04 ENCOUNTER — Telehealth: Payer: Self-pay | Admitting: Neurology

## 2020-09-04 VITALS — BP 108/69 | HR 70 | Ht 67.0 in | Wt 206.0 lb

## 2020-09-04 DIAGNOSIS — E039 Hypothyroidism, unspecified: Secondary | ICD-10-CM | POA: Diagnosis not present

## 2020-09-04 DIAGNOSIS — E7220 Disorder of urea cycle metabolism, unspecified: Secondary | ICD-10-CM

## 2020-09-04 DIAGNOSIS — R419 Unspecified symptoms and signs involving cognitive functions and awareness: Secondary | ICD-10-CM

## 2020-09-04 DIAGNOSIS — R569 Unspecified convulsions: Secondary | ICD-10-CM | POA: Diagnosis not present

## 2020-09-04 DIAGNOSIS — R7989 Other specified abnormal findings of blood chemistry: Secondary | ICD-10-CM | POA: Diagnosis not present

## 2020-09-04 DIAGNOSIS — R7309 Other abnormal glucose: Secondary | ICD-10-CM | POA: Diagnosis not present

## 2020-09-04 MED ORDER — LEVETIRACETAM 500 MG PO TABS: 500.0000 mg | ORAL_TABLET | Freq: Two times a day (BID) | ORAL | 3 refills | Status: DC

## 2020-09-04 NOTE — Patient Instructions (Addendum)
MRI of the brain w/wo contrast seizure protocol  Bloodwork today Start Keppra EEG in the office  Possibly an extended EEG at home or in the hospital  Per [facility] statutes, patients with seizures are not allowed to drive until they have been seizure-free for six months.    Use caution when using heavy equipment or power tools. Avoid working on ladders or at heights. Take showers instead of baths. Ensure the water temperature is not too high on the home water heater. Do not go swimming alone. Do not lock yourself in a room alone (i.e. bathroom). When caring for infants or small children, sit down when holding, feeding, or changing them to minimize risk of injury to the child in the event you have a seizure. Maintain good sleep hygiene. Avoid alcohol.    If patient has another seizure, call 911 and bring them back to the ED if: A.  The seizure lasts longer than 5 minutes.      B.  The patient doesn't wake shortly after the seizure or has new problems such as difficulty seeing, speaking or moving following the seizure C.  The patient was injured during the seizure D.  The patient has a temperature over 102 F (39C) E.  The patient vomited during the seizure and now is having trouble breathing  Per [facility] statutes, patients with seizures are not allowed to drive until they have been seizure-free for six months.  Other recommendations include using caution when using heavy equipment or power tools. Avoid working on ladders or at heights. Take showers instead of baths.  Do not swim alone.  Ensure the water temperature is not too high on the home water heater. Do not go swimming alone. Do not lock yourself in a room alone (i.e. bathroom). When caring for infants or small children, sit down when holding, feeding, or changing them to minimize risk of injury to the child in the event you have a seizure. Maintain good sleep hygiene. Avoid alcohol.  Also recommend adequate sleep, hydration,  good diet and minimize stress.  During the Seizure  - First, ensure adequate ventilation and place patients on the floor on their left side  Loosen clothing around the neck and ensure the airway is patent. If the patient is clenching the teeth, do not force the mouth open with any object as this can cause severe damage - Remove all items from the surrounding that can be hazardous. The patient may be oblivious to what's happening and may not even know what he or she is doing. If the patient is confused and wandering, either gently guide him/her away and block access to outside areas - Reassure the individual and be comforting - Call 911. In most cases, the seizure ends before EMS arrives. However, there are cases when seizures may last over 3 to 5 minutes. Or the individual may have developed breathing difficulties or severe injuries. If a pregnant patient or a person with diabetes develops a seizure, it is prudent to call an ambulance. - Finally, if the patient does not regain full consciousness, then call EMS. Most patients will remain confused for about 45 to 90 minutes after a seizure, so you must use judgment in calling for help. - Avoid restraints but make sure the patient is in a bed with padded side rails - Place the individual in a lateral position with the neck slightly flexed; this will help the saliva drain from the mouth and prevent the tongue from  falling backward - Remove all nearby furniture and other hazards from the area - Provide verbal assurance as the individual is regaining consciousness - Provide the patient with privacy if possible - Call for help and start treatment as ordered by the caregiver   fter the Seizure (Postictal Stage)  After a seizure, most patients experience confusion, fatigue, muscle pain and/or a headache. Thus, one should permit the individual to sleep. For the next few days, reassurance is essential. Being calm and helping reorient the person is also of  importance.  Most seizures are painless and end spontaneously. Seizures are not harmful to others but can lead to complications such as stress on the lungs, brain and the heart. Individuals with prior lung problems may develop labored breathing and respiratory distress.    Levetiracetam tablets What is this medicine? LEVETIRACETAM (lee ve tye RA se tam) is an antiepileptic drug. It is used with other medicines to treat certain types of seizures. This medicine may be used for other purposes; ask your health care provider or pharmacist if you have questions. COMMON BRAND NAME(S): Keppra, Roweepra What should I tell my health care provider before I take this medicine? They need to know if you have any of these conditions:  kidney disease  suicidal thoughts, plans, or attempt; a previous suicide attempt by you or a family member  an unusual or allergic reaction to levetiracetam, other medicines, foods, dyes, or preservatives  pregnant or trying to get pregnant  breast-feeding How should I use this medicine? Take this medicine by mouth with a glass of water. Follow the directions on the prescription label. Swallow the tablets whole. Do not crush or chew this medicine. You may take this medicine with or without food. Take your doses at regular intervals. Do not take your medicine more often than directed. Do not stop taking this medicine or any of your seizure medicines unless instructed by your doctor or health care professional. Stopping your medicine suddenly can increase your seizures or their severity. A special MedGuide will be given to you by the pharmacist with each prescription and refill. Be sure to read this information carefully each time. Contact your pediatrician or health care professional regarding the use of this medication in children. While this drug may be prescribed for children as young as 61 years of age for selected conditions, precautions do apply. Overdosage: If you think  you have taken too much of this medicine contact a poison control center or emergency room at once. NOTE: This medicine is only for you. Do not share this medicine with others. What if I miss a dose? If you miss a dose, take it as soon as you can. If it is almost time for your next dose, take only that dose. Do not take double or extra doses. What may interact with this medicine? This medicine may interact with the following medications:  carbamazepine  colesevelam  probenecid  sevelamer This list may not describe all possible interactions. Give your health care provider a list of all the medicines, herbs, non-prescription drugs, or dietary supplements you use. Also tell them if you smoke, drink alcohol, or use illegal drugs. Some items may interact with your medicine. What should I watch for while using this medicine? Visit your doctor or health care provider for a regular check on your progress. Wear a medical identification bracelet or chain to say you have epilepsy, and carry a card that lists all your medications. This medicine may cause serious skin reactions.  They can happen weeks to months after starting the medicine. Contact your health care provider right away if you notice fevers or flu-like symptoms with a rash. The rash may be red or purple and then turn into blisters or peeling of the skin. Or, you might notice a red rash with swelling of the face, lips or lymph nodes in your neck or under your arms. It is important to take this medicine exactly as instructed by your health care provider. When first starting treatment, your dose may need to be adjusted. It may take weeks or months before your dose is stable. You should contact your doctor or health care provider if your seizures get worse or if you have any new types of seizures. You may get drowsy or dizzy. Do not drive, use machinery, or do anything that needs mental alertness until you know how this medicine affects you. Do not  stand or sit up quickly, especially if you are an older patient. This reduces the risk of dizzy or fainting spells. Alcohol may interfere with the effect of this medicine. Avoid alcoholic drinks. The use of this medicine may increase the chance of suicidal thoughts or actions. Pay special attention to how you are responding while on this medicine. Any worsening of mood, or thoughts of suicide or dying should be reported to your health care provider right away. Women who become pregnant while using this medicine may enroll in the [place] Antiepileptic Drug Pregnancy Registry by calling [phone]. This registry collects information about the safety of antiepileptic drug use during pregnancy. What side effects may I notice from receiving this medicine? Side effects that you should report to your doctor or health care professional as soon as possible:  allergic reactions like skin rash, itching or hives, swelling of the face, lips, or tongue  breathing problems  dark urine  general ill feeling or flu-like symptoms  problems with balance, talking, walking  rash, fever, and swollen lymph nodes  redness, blistering, peeling or loosening of the skin, including inside the mouth  unusually weak or tired  worsening of mood, thoughts or actions of suicide or dying  yellowing of the eyes or skin Side effects that usually do not require medical attention (report to your doctor or health care professional if they continue or are bothersome):  diarrhea  dizzy, drowsy  headache  loss of appetite This list may not describe all possible side effects. Call your doctor for medical advice about side effects. You may report side effects to FDA at 1-800-FDA-1088. Where should I keep my medicine? Keep out of reach of children. Store at room temperature between 15 and 30 degrees C (59 and 86 degrees F). Throw away any unused medicine after the expiration date. NOTE: This sheet is a summary.  It may not cover all possible information. If you have questions about this medicine, talk to your doctor, pharmacist, or health care provider.  2021 Elsevier/Gold Standard (2018-10-20 15:23:36)

## 2020-09-04 NOTE — Telephone Encounter (Signed)
I left a voicemail with Bianka at Dr. Renne Crigler office to see if Dr. Renne Crigler will switched the order to a MRI Brain w/wo contrast instead of the MRI Brain wo contrast since it is already approved and scheduled under his name. I left my direct number for a call back.

## 2020-09-05 LAB — CBC WITH DIFFERENTIAL/PLATELET
Basophils Absolute: 0.1 10*3/uL (ref 0.0–0.2)
Basos: 1 %
EOS (ABSOLUTE): 0.2 10*3/uL (ref 0.0–0.4)
Eos: 3 %
Hematocrit: 40.8 % (ref 37.5–51.0)
Hemoglobin: 14.5 g/dL (ref 13.0–17.7)
Immature Grans (Abs): 0 10*3/uL (ref 0.0–0.1)
Immature Granulocytes: 0 %
Lymphocytes Absolute: 2.3 10*3/uL (ref 0.7–3.1)
Lymphs: 30 %
MCH: 34.7 pg — ABNORMAL HIGH (ref 26.6–33.0)
MCHC: 35.5 g/dL (ref 31.5–35.7)
MCV: 98 fL — ABNORMAL HIGH (ref 79–97)
Monocytes Absolute: 1 10*3/uL — ABNORMAL HIGH (ref 0.1–0.9)
Monocytes: 14 %
Neutrophils Absolute: 3.9 10*3/uL (ref 1.4–7.0)
Neutrophils: 52 %
Platelets: 197 10*3/uL (ref 150–450)
RBC: 4.18 x10E6/uL (ref 4.14–5.80)
RDW: 14 % (ref 11.6–15.4)
WBC: 7.6 10*3/uL (ref 3.4–10.8)

## 2020-09-05 LAB — COMPREHENSIVE METABOLIC PANEL
ALT: 51 IU/L — ABNORMAL HIGH (ref 0–44)
AST: 84 IU/L — ABNORMAL HIGH (ref 0–40)
Albumin/Globulin Ratio: 1.1 — ABNORMAL LOW (ref 1.2–2.2)
Albumin: 3.8 g/dL (ref 3.8–4.9)
Alkaline Phosphatase: 138 IU/L — ABNORMAL HIGH (ref 44–121)
BUN/Creatinine Ratio: 8 — ABNORMAL LOW (ref 9–20)
BUN: 7 mg/dL (ref 6–24)
Bilirubin Total: 2.7 mg/dL — ABNORMAL HIGH (ref 0.0–1.2)
CO2: 21 mmol/L (ref 20–29)
Calcium: 9 mg/dL (ref 8.7–10.2)
Chloride: 104 mmol/L (ref 96–106)
Creatinine, Ser: 0.89 mg/dL (ref 0.76–1.27)
GFR calc Af Amer: 108 mL/min/{1.73_m2} (ref >59)
GFR calc non Af Amer: 94 mL/min/{1.73_m2} (ref >59)
Globulin, Total: 3.4 g/dL (ref 1.5–4.5)
Glucose: 113 mg/dL — ABNORMAL HIGH (ref 65–99)
Potassium: 4.1 mmol/L (ref 3.5–5.2)
Sodium: 139 mmol/L (ref 134–144)
Total Protein: 7.2 g/dL (ref 6.0–8.5)

## 2020-09-05 LAB — T4, FREE: Free T4: 1.87 ng/dL — ABNORMAL HIGH (ref 0.82–1.77)

## 2020-09-05 LAB — TSH: TSH: 6.92 u[IU]/mL — ABNORMAL HIGH (ref 0.450–4.500)

## 2020-09-05 LAB — AMMONIA: Ammonia: 120 ug/dL (ref 40–200)

## 2020-09-05 LAB — HEMOGLOBIN A1C
Est. average glucose Bld gHb Est-mCnc: 200 mg/dL
Hgb A1c MFr Bld: 8.6 % — ABNORMAL HIGH (ref 4.8–5.6)

## 2020-09-08 NOTE — Telephone Encounter (Signed)
Andrew Patton with Dr. Renne Crigler office called me back and I informed her that since Dr. Renne Crigler order the MRI Brain wo contrast and it is already scheduled at [facility] for 09/18/20. Could Dr. Renne Crigler just put a MRI order in for a MRI Brain w/wo contrast . Andrew Patton stated she could do that and would call [facility] to switch the orders. Andrew Patton Phone number is [phone].

## 2020-09-15 ENCOUNTER — Ambulatory Visit: Payer: BC Managed Care – PPO | Admitting: Neurology

## 2020-09-15 DIAGNOSIS — R569 Unspecified convulsions: Secondary | ICD-10-CM

## 2020-09-15 DIAGNOSIS — R419 Unspecified symptoms and signs involving cognitive functions and awareness: Secondary | ICD-10-CM

## 2020-09-18 ENCOUNTER — Other Ambulatory Visit: Payer: BC Managed Care – PPO

## 2020-09-18 NOTE — Telephone Encounter (Signed)
Patient is scheduled at [facility] for 09/23/20.

## 2020-09-23 ENCOUNTER — Other Ambulatory Visit: Payer: Self-pay

## 2020-09-23 ENCOUNTER — Ambulatory Visit
Admission: RE | Admit: 2020-09-23 | Discharge: 2020-09-23 | Disposition: A | Discharge status: Home / Self Care | Payer: BC Managed Care – PPO | Source: Ambulatory Visit | Attending: Internal Medicine | Admitting: Internal Medicine

## 2020-09-23 DIAGNOSIS — R569 Unspecified convulsions: Secondary | ICD-10-CM | POA: Diagnosis not present

## 2020-09-23 DIAGNOSIS — R410 Disorientation, unspecified: Secondary | ICD-10-CM

## 2020-09-23 MED ORDER — GADOBENATE DIMEGLUMINE 529 MG/ML IV SOLN
19.0000 mL | Freq: Once | INTRAVENOUS | Status: AC | PRN
  Administered 2020-09-23: 19 mL via INTRAVENOUS

## 2020-09-24 NOTE — Procedures (Signed)
   HISTORY: 60 year old male, presented with memory loss, episode of confusion, history of concussion in July 2021. TECHNIQUE:  This is a routine 16 channel EEG recording with one channel devoted to a limited EKG recording.  It was performed during wakefulness, drowsiness and asleep.  Hyperventilation and photic stimulation were performed as activating procedures.  There are minimum muscle and movement artifact noted.  Upon maximum arousal, posterior dominant waking rhythm consistent of mildly dysrhythmic slow activity, 7 Hz. Activities are symmetric over the bilateral posterior derivations and attenuated with eye opening.  Hyperventilation produced mild/moderate buildup with higher amplitude and the slower activities noted.  Photic stimulation did not alter the tracing.  During EEG recording, patient developed drowsiness and no deeper stage of sleep was achieved  During EEG recording, there was no epileptiform discharge noted.  EKG demonstrate sinus rhythm, with heart rate of 60 bpm  CONCLUSION: This is a mild abnormal EEG.  There is evidence of mild background slowing, indicate mild bihemispheric malfunction.  Common etiology are metabolic toxic.  Levert Feinstein, M.D. Ph.D.  [facility] [address] [place], [place] [address] Phone: [phone] Fax:      [phone]

## 2020-09-28 ENCOUNTER — Other Ambulatory Visit: Payer: Self-pay | Admitting: Gastroenterology

## 2020-09-28 DIAGNOSIS — R748 Abnormal levels of other serum enzymes: Secondary | ICD-10-CM

## 2020-09-28 DIAGNOSIS — K3189 Other diseases of stomach and duodenum: Secondary | ICD-10-CM

## 2020-09-28 DIAGNOSIS — R188 Other ascites: Secondary | ICD-10-CM

## 2020-09-28 DIAGNOSIS — K766 Portal hypertension: Secondary | ICD-10-CM

## 2020-09-28 DIAGNOSIS — K7460 Unspecified cirrhosis of liver: Secondary | ICD-10-CM

## 2020-09-28 DIAGNOSIS — K2970 Gastritis, unspecified, without bleeding: Secondary | ICD-10-CM

## 2020-10-06 ENCOUNTER — Other Ambulatory Visit: Payer: Self-pay | Admitting: Gastroenterology

## 2020-10-06 ENCOUNTER — Telehealth: Payer: Self-pay | Admitting: Gastroenterology

## 2020-10-06 DIAGNOSIS — K7460 Unspecified cirrhosis of liver: Secondary | ICD-10-CM

## 2020-10-06 DIAGNOSIS — R188 Other ascites: Secondary | ICD-10-CM

## 2020-10-06 DIAGNOSIS — I10 Essential (primary) hypertension: Secondary | ICD-10-CM | POA: Diagnosis not present

## 2020-10-06 DIAGNOSIS — K2970 Gastritis, unspecified, without bleeding: Secondary | ICD-10-CM

## 2020-10-06 DIAGNOSIS — K766 Portal hypertension: Secondary | ICD-10-CM

## 2020-10-06 DIAGNOSIS — K3189 Other diseases of stomach and duodenum: Secondary | ICD-10-CM

## 2020-10-06 DIAGNOSIS — R748 Abnormal levels of other serum enzymes: Secondary | ICD-10-CM

## 2020-10-06 DIAGNOSIS — G40209 Localization-related (focal) (partial) symptomatic epilepsy and epileptic syndromes with complex partial seizures, not intractable, without status epilepticus: Secondary | ICD-10-CM | POA: Diagnosis not present

## 2020-10-06 NOTE — Telephone Encounter (Signed)
Andrew Patton from [facility] is requesting a refill on the pt's Lasix.  [phone] [phone]

## 2020-10-07 MED ORDER — FUROSEMIDE 40 MG PO TABS: 40.0000 mg | ORAL_TABLET | Freq: Every day | ORAL | 5 refills | Status: DC

## 2020-10-07 NOTE — Telephone Encounter (Signed)
Yes please with RF5. Thanks

## 2020-10-07 NOTE — Telephone Encounter (Signed)
Refills for Lasix sent to [facility].

## 2020-10-13 ENCOUNTER — Other Ambulatory Visit: Payer: Self-pay

## 2020-10-13 DIAGNOSIS — R188 Other ascites: Secondary | ICD-10-CM

## 2020-10-13 DIAGNOSIS — R748 Abnormal levels of other serum enzymes: Secondary | ICD-10-CM

## 2020-10-13 DIAGNOSIS — K7460 Unspecified cirrhosis of liver: Secondary | ICD-10-CM

## 2020-10-13 DIAGNOSIS — K3189 Other diseases of stomach and duodenum: Secondary | ICD-10-CM

## 2020-10-13 DIAGNOSIS — K2970 Gastritis, unspecified, without bleeding: Secondary | ICD-10-CM

## 2020-10-14 MED ORDER — SPIRONOLACTONE 100 MG PO TABS: 100.0000 mg | ORAL_TABLET | Freq: Every day | ORAL | 5 refills | Status: DC

## 2020-10-14 NOTE — Telephone Encounter (Signed)
[facility] refill

## 2020-10-15 DIAGNOSIS — R946 Abnormal results of thyroid function studies: Secondary | ICD-10-CM | POA: Diagnosis not present

## 2020-10-20 DIAGNOSIS — R232 Flushing: Secondary | ICD-10-CM | POA: Diagnosis not present

## 2020-10-20 DIAGNOSIS — E1169 Type 2 diabetes mellitus with other specified complication: Secondary | ICD-10-CM | POA: Diagnosis not present

## 2020-10-20 DIAGNOSIS — E041 Nontoxic single thyroid nodule: Secondary | ICD-10-CM | POA: Diagnosis not present

## 2020-10-20 DIAGNOSIS — R946 Abnormal results of thyroid function studies: Secondary | ICD-10-CM | POA: Diagnosis not present

## 2020-10-21 DIAGNOSIS — L4050 Arthropathic psoriasis, unspecified: Secondary | ICD-10-CM | POA: Diagnosis not present

## 2020-10-21 DIAGNOSIS — Z79899 Other long term (current) drug therapy: Secondary | ICD-10-CM | POA: Diagnosis not present

## 2020-10-21 DIAGNOSIS — L409 Psoriasis, unspecified: Secondary | ICD-10-CM | POA: Diagnosis not present

## 2020-10-21 DIAGNOSIS — M1990 Unspecified osteoarthritis, unspecified site: Secondary | ICD-10-CM | POA: Diagnosis not present

## 2020-10-27 ENCOUNTER — Other Ambulatory Visit: Payer: Self-pay

## 2020-10-27 ENCOUNTER — Ambulatory Visit: Payer: BC Managed Care – PPO | Admitting: Neurology

## 2020-10-27 ENCOUNTER — Encounter: Payer: Self-pay | Admitting: Neurology

## 2020-10-27 VITALS — BP 114/69 | HR 86 | Ht 67.75 in | Wt 204.2 lb

## 2020-10-27 DIAGNOSIS — E119 Type 2 diabetes mellitus without complications: Secondary | ICD-10-CM | POA: Insufficient documentation

## 2020-10-27 DIAGNOSIS — F0781 Postconcussional syndrome: Secondary | ICD-10-CM

## 2020-10-27 DIAGNOSIS — R419 Unspecified symptoms and signs involving cognitive functions and awareness: Secondary | ICD-10-CM | POA: Diagnosis not present

## 2020-10-27 DIAGNOSIS — E039 Hypothyroidism, unspecified: Secondary | ICD-10-CM | POA: Insufficient documentation

## 2020-10-27 DIAGNOSIS — R0683 Snoring: Secondary | ICD-10-CM

## 2020-10-27 DIAGNOSIS — R400 Somnolence: Secondary | ICD-10-CM | POA: Diagnosis not present

## 2020-10-27 DIAGNOSIS — G40209 Localization-related (focal) (partial) symptomatic epilepsy and epileptic syndromes with complex partial seizures, not intractable, without status epilepticus: Secondary | ICD-10-CM

## 2020-10-27 DIAGNOSIS — E1165 Type 2 diabetes mellitus with hyperglycemia: Secondary | ICD-10-CM

## 2020-10-27 MED ORDER — LEVETIRACETAM ER 500 MG PO TB24: 1000.0000 mg | ORAL_TABLET | Freq: Every day | ORAL | 3 refills | Status: DC

## 2020-10-27 NOTE — Patient Instructions (Signed)
FL-41 for glasses for light sensitivity Change to keppra extended release. sleep evaluation If no etiology found, change around the seizure medication and discuss other options to help with hypersomnia And also consider formal memory testing to see if it is persistent concussive symptoms   Levetiracetam extended-release tablets What is this medicine? LEVETIRACETAM (lee ve tye RA se tam) is an antiepileptic drug. It is used with other medicines to treat certain types of seizures. This medicine may be used for other purposes; ask your health care provider or pharmacist if you have questions. COMMON BRAND NAME(S): ELEPSIA XR, Keppra XR, Roweepra What should I tell my health care provider before I take this medicine? They need to know if you have any of these conditions:  kidney disease  suicidal thoughts, plans, or attempt; a previous suicide attempt by you or a family member  an unusual or allergic reaction to levetiracetam, other medicines, foods, dyes, or preservatives  pregnant or trying to get pregnant  breast-feeding How should I use this medicine? Take this medicine by mouth with a glass of water. Follow the directions on the prescription label. Do not cut, crush or chew this medicine. You may take this medicine with or without food. Take your doses at regular intervals. Do not take your medicine more often than directed. Do not stop taking this medicine or any of your seizure medicines unless instructed by your doctor or health care professional. Stopping your medicine suddenly can increase your seizures or their severity. A special MedGuide will be given to you by the pharmacist with each prescription and refill. Be sure to read this information carefully each time. Contact your pediatrician or health care professional regarding the use of this medication in children. While this drug may be prescribed for children as young as 24 years of age for selected conditions, precautions do  apply. Overdosage: If you think you have taken too much of this medicine contact a poison control center or emergency room at once. NOTE: This medicine is only for you. Do not share this medicine with others. What if I miss a dose? If you miss a dose and it has only been a few hours, take it as soon as you can. If it is almost time for your next dose, take only that dose. Do not take double or extra doses. What may interact with this medicine? This medicine may interact with the following medications:  carbamazepine  colesevelam  probenecid  sevelamer This list may not describe all possible interactions. Give your health care provider a list of all the medicines, herbs, non-prescription drugs, or dietary supplements you use. Also tell them if you smoke, drink alcohol, or use illegal drugs. Some items may interact with your medicine. What should I watch for while using this medicine? Visit your doctor or health care provider for a regular check on your progress. Wear a medical identification bracelet or chain to say you have epilepsy, and carry a card that lists all your medications. This medicine may cause serious skin reactions. They can happen weeks to months after starting the medicine. Contact your health care provider right away if you notice fevers or flu-like symptoms with a rash. The rash may be red or purple and then turn into blisters or peeling of the skin. Or, you might notice a red rash with swelling of the face, lips or lymph nodes in your neck or under your arms. It is important to take this medicine exactly as instructed by your health care  provider. When first starting treatment, your dose may need to be adjusted. It may take weeks or months before your dose is stable. You should contact your doctor or health care provider if your seizures get worse or if you have any new types of seizures. You may get drowsy or dizzy. Do not drive, use machinery, or do anything that needs mental  alertness until you know how this medicine affects you. Do not stand or sit up quickly, especially if you are an older patient. This reduces the risk of dizzy or fainting spells. Alcohol may interfere with the effect of this medicine. Avoid alcoholic drinks. The use of this medicine may increase the chance of suicidal thoughts or actions. Pay special attention to how you are responding while on this medicine. Any worsening of mood, or thoughts of suicide or dying should be reported to your health care provider right away. The tablet shell for some brands of this medicine does not dissolve. This is normal. The tablet shell may appear in the stool. This is not cause for concern. Women who become pregnant while using this medicine may enroll in the [place] Antiepileptic Drug Pregnancy Registry by calling [phone]. This registry collects information about the safety of antiepileptic drug use during pregnancy. What side effects may I notice from receiving this medicine? Side effects that you should report to your doctor or health care professional as soon as possible:  allergic reactions like skin rash, itching or hives, swelling of the face, lips, or tongue  breathing problems  changes in emotions or moods  dark urine  general ill feeling or flu-like symptoms  problems with balance, talking, walking  rash, fever, and swollen lymph nodes  redness, blistering, peeling or loosening of the skin, including inside the mouth  suicidal thoughts or actions  unusually weak or tired  yellowing of the eyes or skin Side effects that usually do not require medical attention (report to your doctor or health care professional if they continue or are bothersome):  diarrhea  dizzy, drowsy  headache  loss of appetite This list may not describe all possible side effects. Call your doctor for medical advice about side effects. You may report side effects to FDA at 1-800-FDA-1088. Where  should I keep my medicine? Keep out of reach of children. Store at room temperature between 15 and 30 degrees C (59 and 86 degrees F). Throw away any unused medicine after the expiration date. NOTE: This sheet is a summary. It may not cover all possible information. If you have questions about this medicine, talk to your doctor, pharmacist, or health care provider.  2021 Elsevier/Gold Standard (2018-10-20 15:14:16)

## 2020-10-27 NOTE — Progress Notes (Signed)
[facility]    Provider:  Dr Lucia Gaskins Requesting Provider: [name], MD Primary Care Provider:  [name], MD  CC:  Memory loss, daytime fatigue, snoring  Interval history 10/27/2020: MRI of the brain was normal for age. EEG showed no epileptiform activity. On Keppra. They have adjusted his thyroid medication and his hgba1c. He snores at night. He does have a deviated septum. He does not sleep well at night. He wakes frequently. He sees an ENT. No moe episodes on the Keppra. But he is very tired. He is closing his eyes dyring the appointment. He does snore, his ESS is 10. Being on the keppra has caused him a lot of zoning out. The medication makes him very tired. And then he wakes up more. When he started the medication he was a zombie.They are scheduled to go to [place] in July. He naps during the day.I would supposrt short-term disability, EEG was slowed. 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 It can be very dangerous. He has sensitivity to light, it is worse, since the concussion. He has had eye exams.   HPI:  Andrew Patton is a 60 y.o. male here as requested by [name], MD([facility]) for memory loss.  Past medical history of diabetes, elevated transaminases secondary to fatty liver, hyperlipidemia, hypothyroidism, insomnia, hypertension, obesity, psoriasis, fatigue, osteoarthritis, diabetes with hypertension and hypercholesterolemia.  I requested his pcp's notes from Dr. Renne Crigler as well.   Patient is here with his wife who provides most information, he is having episodes of confusion, he had a concussion in July 2021 and since then he had several episodes where he would not know where he is going, wife would make him pull over. A "glazed look" in his eye. They started possibly in September of 2021, he had an episode in a restaurant where he got a glazed look in his face, he was confused and disoriented, lasting 5-10 minutes,  slowly resolve, he says he remembers these. This year January 8th they were at a winery and they were there at 12:15 maybe been there 20-25 minutes and sitting talking at a table and he was eating nuts, he had not had anything to eat yet, he took his shirt off, he said he was going to bed, he wants to go lay down, didn;t know where he was, thought he was at home, they got him into a chair in the office and wanted to get up and wander. They checked heart rate and pulse, it was fine, no loss of consciousness, he would not answer questions such as "do you know who I am", no focal weakness at the time, no speech slurring, they called the EMT and his glucose was 242.  He has had a couple other smaller episodes where he was more attentive but disoriented still. The one on January 8th of this year lasted 25 minutes and slowly resolved like "waking up from a dream". His ammonia level was elevated. The last episode was on the 18th of January, they went to the store and he did not look right, he had the grocery cart and kept running into the sides.     I reviewed [name] notes:  he is a 60 year old male with history of prediabetes, liver disease, arthritis that complains of multiple episodes of disorientation and memory last, he had a traumatic brain injury in July 2021 but acute imaging CT of the brain showed no acute findings, since then he  has had multiple episodes most recently earlier this month of 25 to 30 minutes of disorientation where his wife noticed he was staring off and not his normal self, no focal seizure activity, no loss of consciousness, no focal weakness, numbness or tingling, I also reviewed Dr. Dorthula Nettles notes, it appears patient was seen on August 13, 2020, patient reported during that visit that he was somewhere and had not eaten that morning, he had a glass of [place], he was eating nuts and berries, when he got up and started walking he wandered around the winery, was completely "not there" for  25 minutes, he woke up in the office, he had buspirone, Lasix and WelChol, per his wife at 1255 he stood up and took off his shirt, very disoriented, agitated and confused not verbal no seizures, EMT arrived and blood sugar was 242, was not seen by the doctor at the emergency room, EKG was reported to be normal, he went to the ER but decided to skip being seen by MD due to the weight.  Labs drawn the same day included a CBC which was unremarkable, it appears ammonia was also taken, he has elevated transaminases secondary to fatty liver, CMP that was collected July 10, 2020 showed an elevated glucose, elevated AST at 94 glucose 135, BUN 9, creatinine 0.9 otherwise unremarkable, hemoglobin A1c July 10, 2020 7.5, free T4 July 10, 2020 1.47, LDL 180, TSH elevated 6.47 July 10, 2020.   I also reviewed notes from the emergency room in July 2021 where he presented for evaluation of headache, nausea, fatigue, photophobia, pain to the right eye after mechanical fall 9 days prior, he was walking in the middle the night from the bedroom to the bathroom when he tripped and fell hitting his face and head against the dresser.  CT of the head did not show anything acute.   Reviewed notes, labs and imaging from outside physicians, which showed:  CT of the head February 08, 2020: Showed generalized mild atrophy, possibly a 9 mm right frontal lobe calcification versus tiny meningioma, personally reviewed images and agree with findings. Review of Systems: Patient complains of symptoms per HPI as well as the following symptoms: fatigue, naps. Pertinent negatives and positives per HPI. All others negative    Social History   Socioeconomic History  . Marital status: Married    Spouse name: Not on file  . Number of children: 2  . Years of education: Not on file  . Highest education level: Associate degree: academic program  Occupational History  . Occupation: Airline pilot  Tobacco Use  . Smoking status: Former  Smoker    Types: Cigarettes    Quit date: 03/27/1994    Years since quitting: 26.6  . Smokeless tobacco: Former Neurosurgeon  . Tobacco comment: did chew as a teenager  Vaping Use  . Vaping Use: Never used  Substance and Sexual Activity  . Alcohol use: Not Currently    Alcohol/week: 2.0 standard drinks    Types: 2 Glasses of wine per week    Comment: daily  . Drug use: No  . Sexual activity: Not on file  Other Topics Concern  . Not on file  Social History Narrative   Lives at home with wife    Right handed   Caffeine: maybe 1 cup/week   Social Determinants of Health   Financial Resource Strain: Not on file  Food Insecurity: Not on file  Transportation Needs: Not on file  Physical Activity: Not on file  Stress: Not on file  Social Connections: Not on file  Intimate Partner Violence: Not on file    Family History  Problem Relation Age of Onset  . Breast cancer Mother   . Hyperlipidemia Mother   . Other Mother        ? form of parkinson's   . Bladder Cancer Father   . Parkinson's disease Father   . Memory loss Maternal Grandmother        in her 90s  . Birth defects Maternal Grandfather   . Birth defects Paternal Grandmother   . Colon cancer Neg Hx   . Esophageal cancer Neg Hx   . Rectal cancer Neg Hx   . Stomach cancer Neg Hx   . Colon polyps Neg Hx   . Dementia Neg Hx   . Alzheimer's disease Neg Hx     Past Medical History:  Diagnosis Date  . Allergy   . Anxiety   . Arthritis   . Ascites   . Diabetes (HCC)   . Elevated transaminase level   . Fatigue   . Fatty liver   . Hyperlipidemia   . Hypertension    "under control", not on any blood pressure medication, has lost 60 lbs  . Hypothyroidism    on synthroid  . Inguinal muscle strain   . Insomnia   . Left knee pain    "long time ago"  . Multinodular goiter   . Obesity   . Pneumonia   . Psoriasis   . Tinnitus     Patient Active Problem List   Diagnosis Date Noted  . Complex partial seizures with  impaired consciousness at onset [facility]) 10/27/2020  . Cognitive complaints 10/27/2020  . Daytime somnolence 10/27/2020  . Hypothyroidism 10/27/2020  . Uncontrolled type 2 diabetes mellitus with hyperglycemia (HCC) 10/27/2020  . Chronic post concussive encephalopathy 10/27/2020  . Abdominal distention   . Diarrhea   . Rectal bleeding 03/08/2019  . Abnormal liver function 03/08/2019  . Portal vein thrombosis 03/07/2019  . Ascites of liver 03/07/2019  . Abdominal pain 03/07/2019  . Unspecified essential hypertension 04/29/2014  . Other and unspecified hyperlipidemia 04/29/2014    Past Surgical History:  Procedure Laterality Date  . APPENDECTOMY  1976  . FOOT SURGERY Bilateral 1975  . NOSE SURGERY    . ROTATOR CUFF REPAIR Left    left  . SHOULDER SURGERY Right 12/20/2019   [facility]     Current Outpatient Medications  Medication Sig Dispense Refill  . azelastine (ASTELIN) 0.1 % nasal spray Place 1 spray into both nostrils 2 (two) times daily. Use in each nostril as directed    . busPIRone (BUSPAR) 15 MG tablet Take 30 mg by mouth 2 (two) times daily.    . Cholecalciferol (VITAMIN D3) 1000 UNITS CAPS Take 1 capsule by mouth daily.     . colesevelam (WELCHOL) 625 MG tablet Take 1,875 mg by mouth daily.    Marland Kitchen ezetimibe (ZETIA) 10 MG tablet Take 10 mg by mouth daily.    . fluticasone (FLONASE) 50 MCG/ACT nasal spray Place 1 spray into both nostrils daily as needed for allergies.     . furosemide (LASIX) 40 MG tablet Take 1 tablet (40 mg total) by mouth daily. 30 tablet 5  . HUMIRA 40 MG/0.4ML PSKT Inject into the skin every 14 (fourteen) days.    Marland Kitchen ipratropium (ATROVENT) 0.06 % nasal spray Place 1 spray into the nose daily.    Boris Lown Oil 500 MG  CAPS Take 500 mg by mouth daily.    . Lactulose 20 GM/30ML SOLN Take 30 mLs (20 g total) by mouth in the morning, at noon, and at bedtime. Please titrate this medication to 2-3 soft stools daily, not diarrhea. (Patient taking  differently: Take 15 mLs by mouth. Please titrate this medication to 2-3 soft stools daily, not diarrhea.  Take 1-2 times a day.) 2700 mL 0  . levETIRAcetam (KEPPRA XR) 500 MG 24 hr tablet Take 2 tablets (1,000 mg total) by mouth daily. 180 tablet 3  . levocetirizine (XYZAL) 5 MG tablet Take 5 mg by mouth every evening.    Marland Kitchen. levothyroxine (SYNTHROID) 50 MCG tablet Take 50 mcg by mouth daily.    . metFORMIN (GLUCOPHAGE) 500 MG tablet Take 1,000 mg by mouth every evening.    . mupirocin cream (BACTROBAN) 2 % Apply 1 application topically 2 (two) times daily. Use BID , 1 week PRN for nasal crusting or scabbing 15 g 0  . Omeprazole Magnesium (PRILOSEC OTC PO) Take 1 tablet by mouth daily.    . ondansetron (ZOFRAN ODT) 4 MG disintegrating tablet Take 1 tablet (4 mg total) by mouth every 8 (eight) hours as needed for nausea or vomiting. 6 tablet 0  . sildenafil (REVATIO) 20 MG tablet Take 1 tablet by mouth as needed.    Marland Kitchen. spironolactone (ALDACTONE) 100 MG tablet Take 1 tablet (100 mg total) by mouth daily. 30 tablet 5  . thiamine 100 MG tablet Take 1 tablet (100 mg total) by mouth daily. 90 tablet 0  . vitamin E 1000 UNIT capsule Take 1,000 Units by mouth daily.    Marland Kitchen. zolpidem (AMBIEN) 5 MG tablet Take 5 mg by mouth at bedtime as needed for sleep.     No current facility-administered medications for this visit.    Allergies as of 10/27/2020 - Review Complete 10/27/2020  Allergen Reaction Noted  . Crestor [rosuvastatin calcium]  03/15/2019  . Mobic [meloxicam] Other (See Comments) 03/08/2019  . Penicillins Rash 01/24/2013    Vitals: BP 114/69   Pulse 86   Ht 5' 7.75" (1.721 m)   Wt 204 lb 4 oz (92.6 kg)   BMI 31.29 kg/m  Last Weight:  Wt Readings from Last 1 Encounters:  10/27/20 204 lb 4 oz (92.6 kg)   Last Height:   Ht Readings from Last 1 Encounters:  10/27/20 5' 7.75" (1.721 m)     Physical exam: Exam: Gen: NAD, conversant, well nourised, obese, well groomed                      CV: RRR, no MRG. No Carotid Bruits. No peripheral edema, warm, nontender Eyes: Conjunctivae clear without exudates or hemorrhage  Neuro: Detailed Neurologic Exam  Speech:    Speech is normal; fluent and spontaneous with normal comprehension.  Cognition:    The patient is oriented to person, place, and time;     recent and remote memory intact;     language fluent;     normal attention, concentration,     fund of knowledge Cranial Nerves:    The pupils are equal, round, and reactive to light. Pupils too small to visualize fundi. Visual fields are full to finger confrontation. Extraocular movements are intact. Trigeminal sensation is intact and the muscles of mastication are normal. The face is symmetric. The palate elevates in the midline. Hearing intact. Voice is normal. Shoulder shrug is normal. The tongue has normal motion without fasciculations.  Coordination:    Normal finger to nose and heel to shin.   Gait:   Normal native gait  Motor Observation:    No asymmetry, no atrophy, and no involuntary movements noted. Tone:    Normal muscle tone.    Posture:    Posture is normal. normal erect    Strength:    Strength is V/V in the upper and lower limbs.      Sensation: intact to LT     Reflex Exam:  DTR's:    Deep tendon reflexes in the upper and lower extremities are normal bilaterally.   Toes:    The toes are downgoing bilaterally.   Clonus:    Clonus is absent.    Assessment/Plan:  60 year old with witnessed, sterotyped episodes of alteration of awareness and confusion after TBI and concussion. Sounds like  "complex partial" seizures now called focal impaired awareness. However cannot rule her hyperglycemia (glucose was 242, not severe but abnormal for patient who usually reports much lower glucose) or hyperammonemia due to his liver condition. Will also recheck tsh and free t4.  He is complaining of significant fatigue, difficulty with thought processes, he gets  very emotional today, he does snore, I am not sure if his symptoms have to do with medication, undiagnosed sleep apnea, long-term concussive side effects, medical conditions or other.  EEG did show bilateral hemispheric slowing which is not epileptiform but does support his cognitive complaints.  - MRI of the brain w/wo contrast seizure protocol was unremarkable normal for age.  Reviewed with patient and wife today. - Very tired, taking naps, snores, head trauma, sleep study/evaluation (ESS 10, he appears to be falling asleep in the office today) - Change keppra to ER at bedtime and see if this helps with side effects of fatigue. If sleep study is negative we may have to change his AEDs entirely, Zonegran may be a good option - Bloodwork today last appointment showed elevated TSH (he is working on that with his primary care and could be contributory) and his hemoglobin A1c was elevated and again he is working on that with his primary care. -Neurovegetative diagnostics for extended 72-hour EEG to rule out continued seizure activity causing or contributing to his cognitive complaints  -If all else is negative including sleep study, I will send him for formal memory testing with Dr. Kieth Brightly, and we can also consider medications for hypersomnia such as Provigil which may help with cognitive complaints as well. - I do not recommend driving right now until we figure out why he is so cognitively impaired. Also discussed no driving for 6 months after seizure.  Orders Placed This Encounter  Procedures  . Ambulatory referral to Sleep Studies   Meds ordered this encounter  Medications  . levETIRAcetam (KEPPRA XR) 500 MG 24 hr tablet    Sig: Take 2 tablets (1,000 mg total) by mouth daily.    Dispense:  180 tablet    Refill:  3    Cc: [name], MD,  [name], MD  Naomie Dean, MD  [facility] [address] [place], [place] [address]  Phone [phone]  Fax [phone]  I spent over 45  minutes of face-to-face and non-face-to-face time with patient on the  1. Complex partial seizures with impaired consciousness at onset [facility])   2. Snoring   3. Daytime somnolence   4. Cognitive complaints   5. Hypothyroidism, unspecified type   6. Uncontrolled type 2 diabetes mellitus with hyperglycemia (  HCC)   7. Chronic post concussive encephalopathy    diagnosis.  This included previsit chart review, lab review, study review, order entry, electronic health record documentation, patient education on the different diagnostic and therapeutic options, counseling and coordination of care, risks and benefits of management, compliance, or risk factor reduction

## 2020-10-28 ENCOUNTER — Telehealth: Payer: Self-pay | Admitting: *Deleted

## 2020-10-28 NOTE — Telephone Encounter (Signed)
Per Dr Lucia Gaskins, completed referral form for 72 hour ambulatory vEEG w/ neurovative diagnostics. Form signed by MD, then faxed to [employer] along with insurance and demographic info, office note, and EEG results. Received a receipt of confirmation.

## 2020-10-29 NOTE — Telephone Encounter (Signed)
Late entry from 10/28/20: Dr Lucia Gaskins signed referral form and I faxed the form and the supporting documents to [facility]. Received a receipt of confirmation.

## 2020-11-03 ENCOUNTER — Encounter: Payer: Self-pay | Admitting: Neurology

## 2020-11-03 ENCOUNTER — Ambulatory Visit: Payer: BC Managed Care – PPO | Admitting: Neurology

## 2020-11-03 VITALS — BP 128/69 | HR 84 | Ht 67.0 in | Wt 204.0 lb

## 2020-11-03 DIAGNOSIS — R419 Unspecified symptoms and signs involving cognitive functions and awareness: Secondary | ICD-10-CM

## 2020-11-03 DIAGNOSIS — F0781 Postconcussional syndrome: Secondary | ICD-10-CM | POA: Diagnosis not present

## 2020-11-03 DIAGNOSIS — G40209 Localization-related (focal) (partial) symptomatic epilepsy and epileptic syndromes with complex partial seizures, not intractable, without status epilepticus: Secondary | ICD-10-CM | POA: Diagnosis not present

## 2020-11-03 DIAGNOSIS — R400 Somnolence: Secondary | ICD-10-CM | POA: Diagnosis not present

## 2020-11-03 DIAGNOSIS — R0683 Snoring: Secondary | ICD-10-CM

## 2020-11-03 NOTE — Patient Instructions (Signed)
Insomnia Insomnia is a sleep disorder that makes it difficult to fall asleep or stay asleep. Insomnia can cause fatigue, low energy, difficulty concentrating, mood swings, and poor performance at work or school. There are three different ways to classify insomnia:  Difficulty falling asleep.  Difficulty staying asleep.  Waking up too early in the morning. Any type of insomnia can be long-term (chronic) or short-term (acute). Both are common. Short-term insomnia usually lasts for three months or less. Chronic insomnia occurs at least three times a week for longer than three months. What are the causes? Insomnia may be caused by another condition, situation, or substance, such as:  Anxiety.  Certain medicines.  Gastroesophageal reflux disease (GERD) or other gastrointestinal conditions.  Asthma or other breathing conditions.  Restless legs syndrome, sleep apnea, or other sleep disorders.  Chronic pain.  Menopause.  Stroke.  Abuse of alcohol, tobacco, or illegal drugs.  Mental health conditions, such as depression.  Caffeine.  Neurological disorders, such as Alzheimer's disease.  An overactive thyroid (hyperthyroidism). Sometimes, the cause of insomnia may not be known. What increases the risk? Risk factors for insomnia include:  Gender. Women are affected more often than men.  Age. Insomnia is more common as you get older.  Stress.  Lack of exercise.  Irregular work schedule or working night shifts.  Traveling between different time zones.  Certain medical and mental health conditions. What are the signs or symptoms? If you have insomnia, the main symptom is having trouble falling asleep or having trouble staying asleep. This may lead to other symptoms, such as:  Feeling fatigued or having low energy.  Feeling nervous about going to sleep.  Not feeling rested in the morning.  Having trouble concentrating.  Feeling irritable, anxious, or depressed. How  is this diagnosed? This condition may be diagnosed based on:  Your symptoms and medical history. Your health care provider may ask about: ? Your sleep habits. ? Any medical conditions you have. ? Your mental health.  A physical exam. How is this treated? Treatment for insomnia depends on the cause. Treatment may focus on treating an underlying condition that is causing insomnia. Treatment may also include:  Medicines to help you sleep.  Counseling or therapy.  Lifestyle adjustments to help you sleep better. Follow these instructions at home: Eating and drinking  Limit or avoid alcohol, caffeinated beverages, and cigarettes, especially close to bedtime. These can disrupt your sleep.  Do not eat a large meal or eat spicy foods right before bedtime. This can lead to digestive discomfort that can make it hard for you to sleep.   Sleep habits  Keep a sleep diary to help you and your health care provider figure out what could be causing your insomnia. Write down: ? When you sleep. ? When you wake up during the night. ? How well you sleep. ? How rested you feel the next day. ? Any side effects of medicines you are taking. ? What you eat and drink.  Make your bedroom a dark, comfortable place where it is easy to fall asleep. ? Put up shades or blackout curtains to block light from outside. ? Use a white noise machine to block noise. ? Keep the temperature cool.  Limit screen use before bedtime. This includes: ? Watching TV. ? Using your smartphone, tablet, or computer.  Stick to a routine that includes going to bed and waking up at the same times every day and night. This can help you fall asleep faster. Consider   making a quiet activity, such as reading, part of your nighttime routine.  Try to avoid taking naps during the day so that you sleep better at night.  Get out of bed if you are still awake after 15 minutes of trying to sleep. Keep the lights down, but try reading or  doing a quiet activity. When you feel sleepy, go back to bed.   General instructions  Take over-the-counter and prescription medicines only as told by your health care provider.  Exercise regularly, as told by your health care provider. Avoid exercise starting several hours before bedtime.  Use relaxation techniques to manage stress. Ask your health care provider to suggest some techniques that may work well for you. These may include: ? Breathing exercises. ? Routines to release muscle tension. ? Visualizing peaceful scenes.  Make sure that you drive carefully. Avoid driving if you feel very sleepy.  Keep all follow-up visits as told by your health care provider. This is important. Contact a health care provider if:  You are tired throughout the day.  You have trouble in your daily routine due to sleepiness.  You continue to have sleep problems, or your sleep problems get worse. Get help right away if:  You have serious thoughts about hurting yourself or someone else. If you ever feel like you may hurt yourself or others, or have thoughts about taking your own life, get help right away. You can go to your nearest emergency department or call:  Your local emergency services (911 in the [place]  A suicide crisis helpline, such as the National Suicide Prevention Lifeline at 1-800-273-8255. This is open 24 hours a day. Summary  Insomnia is a sleep disorder that makes it difficult to fall asleep or stay asleep.  Insomnia can be long-term (chronic) or short-term (acute).  Treatment for insomnia depends on the cause. Treatment may focus on treating an underlying condition that is causing insomnia.  Keep a sleep diary to help you and your health care provider figure out what could be causing your insomnia. This information is not intended to replace advice given to you by your health care provider. Make sure you discuss any questions you have with your health care provider. Document  Revised: 05/29/2020 Document Reviewed: 05/29/2020 Elsevier Patient Education  2021 Elsevier Inc. Quality Sleep Information, Adult Quality sleep is important for your mental and physical health. It also improves your quality of life. Quality sleep means you:  Are asleep for most of the time you are in bed.  Fall asleep within 30 minutes.  Wake up no more than once a night.  Are awake for no longer than 20 minutes if you do wake up during the night. Most adults need 7-8 hours of quality sleep each night. How can poor sleep affect me? If you do not get enough quality sleep, you may have:  Mood swings.  Daytime sleepiness.  Confusion.  Decreased reaction time.  Sleep disorders, such as insomnia and sleep apnea.  Difficulty with: ? Solving problems. ? Coping with stress. ? Paying attention. These issues may affect your performance and productivity at work, school, and at home. Lack of sleep may also put you at higher risk for accidents, suicide, and risky behaviors. If you do not get quality sleep you may also be at higher risk for several health problems, including:  Infections.  Type 2 diabetes.  Heart disease.  High blood pressure.  Obesity.  Worsening of long-term conditions, like arthritis, kidney disease, depression, Parkinson's disease,   and epilepsy. What actions can I take to get more quality sleep?  Stick to a sleep schedule. Go to sleep and wake up at about the same time each day. Do not try to sleep less on weekdays and make up for lost sleep on weekends. This does not work.  Try to get about 30 minutes of exercise on most days. Do not exercise 2-3 hours before going to bed.  Limit naps during the day to 30 minutes or less.  Do not use any products that contain nicotine or tobacco, such as cigarettes or e-cigarettes. If you need help quitting, ask your health care provider.  Do not drink caffeinated beverages for at least 8 hours before going to bed.  Coffee, tea, and some sodas contain caffeine.  Do not drink alcohol close to bedtime.  Do not eat large meals close to bedtime.  Do not take naps in the late afternoon.  Try to get at least 30 minutes of sunlight every day. Morning sunlight is best.  Make time to relax before bed. Reading, listening to music, or taking a hot bath promotes quality sleep.  Make your bedroom a place that promotes quality sleep. Keep your bedroom dark, quiet, and at a comfortable room temperature. Make sure your bed is comfortable. Take out sleep distractions like TV, a computer, smartphone, and bright lights.  If you are lying awake in bed for longer than 20 minutes, get up and do a relaxing activity until you feel sleepy.  Work with your health care provider to treat medical conditions that may affect sleeping, such as: ? Nasal obstruction. ? Snoring. ? Sleep apnea and other sleep disorders.  Talk to your health care provider if you think any of your prescription medicines may cause you to have difficulty falling or staying asleep.  If you have sleep problems, talk with a sleep consultant. If you think you have a sleep disorder, talk with your health care provider about getting evaluated by a specialist.      Where to find more information  National Sleep Foundation website: https://sleepfoundation.org  National Heart, Lung, and Blood Institute (NHLBI): www.nhlbi.nih.gov/files/docs/public/sleep/healthy_sleep.pdf  Centers for Disease Control and Prevention (CDC): www.cdc.gov/sleep/index.html Contact a health care provider if you:  Have trouble getting to sleep or staying asleep.  Often wake up very early in the morning and cannot get back to sleep.  Have daytime sleepiness.  Have daytime sleep attacks of suddenly falling asleep and sudden muscle weakness (narcolepsy).  Have a tingling sensation in your legs with a strong urge to move your legs (restless legs syndrome).  Stop breathing  briefly during sleep (sleep apnea).  Think you have a sleep disorder or are taking a medicine that is affecting your quality of sleep. Summary  Most adults need 7-8 hours of quality sleep each night.  Getting enough quality sleep is an important part of health and well-being.  Make your bedroom a place that promotes quality sleep and avoid things that may cause you to have poor sleep, such as alcohol, caffeine, smoking, and large meals.  Talk to your health care provider if you have trouble falling asleep or staying asleep. This information is not intended to replace advice given to you by your health care provider. Make sure you discuss any questions you have with your health care provider. Document Revised: 10/26/2017 Document Reviewed: 10/26/2017 Elsevier Patient Education  2021 Elsevier Inc.  

## 2020-11-03 NOTE — Progress Notes (Addendum)
SLEEP MEDICINE CLINIC    Provider:  Melvyn Novas, MD  Primary Care Physician:  [name], MD [address] [place] [address] [place] [address]     Referring Provider: Dr Lucia Gaskins , MD         Chief Complaint according to patient   Patient presents with:    . New Patient (Initial Visit)           HISTORY OF PRESENT ILLNESS:   11-03-2020 Andrew Patton is a 60  Year- old  male patient  and seen upon a referral by Dr Lucia Gaskins on 11/03/2020 . Chief concern according to patient : Insomnia,     Andrew Patton  has a past medical history of Allergies - hayfever, Anxiety, Psoriasis -Arthritis, Ascites, Diabetes mellitus type - 2 (HCC),  Elevated transaminase level, Fatigue, Fatty liver, Hyperlipidemia, Hypertension, Hypothyroidism, Inguinal muscle strain, Insomnia, Left knee pain, Multinodular goiter, Obesity, Pneumonia, Psoriasis, and Tinnitus.     Sleep relevant medical history: Nocturia- on diuretics, taken BID- seizures at night and in day- no sleep walking - Nasal septal surgery, sinus surgery. Still has a deviated septum, 2 fractures and  Tonsillectomy, appendectomy, Concussion, right shoulder injury.  2021. Family medical /sleep history: No other family member on CPAP with OSA, insomnia, sleep walkers. wife is a CPAP sure.    Social history:  Patient is retired from Occupational hygienist- and lives in a household with spouse, no pets, they have adult children.  The patient currently works/ used to work in shifts( Chief Technology Officer,) Tobacco use: 27 years ago. Marland Kitchen  ETOH use ; yes, moderately due to liver ( 4 drinks a week or less , Caffeine intake in form of Coffee( 1 cup in AM) no energy drinks. Regular exercise: none now.      Sleep habits are as follows: The patient's dinner time is between 5-7 PM. He sometimes sleeps on and off all day and is not eating (!).  The patient goes to sleep at 10 PM on the couch and then f goes to bed- may stay up until; 1 AM.   and continues  to sleep for 4-6 hours, wakes for several bathroom breaks.   The preferred sleep position is left or supine, with the support of 3 pillows.  Dreams are reportedly infrequent/ but vivid. Surreal.   has no usual rise time. The patient wakes up with difficulties.  He reports not feeling refreshed or restored in AM, with symptoms such as dry mouth , morning headaches, and residual fatigue.  Naps are taken in PM , falling asleep while watching TV- not in the bedroom.   lasting from 30-120 minutes .    Review of Systems: Out of a complete 14 system review, the patient complains of only the following symptoms, and all other reviewed systems are negative.:  Fatigue, sleepiness , snoring,  fragmented sleep, Insomnia - irregular sleep habits.    How likely are you to doze in the following situations: 0 = not likely, 1 = slight chance, 2 = moderate chance, 3 = high chance   Sitting and Reading? Watching Television? Sitting inactive in a public place (theater or meeting)? As a passenger in a car for an hour without a break? Lying down in the afternoon when circumstances permit? Sitting and talking to someone? Sitting quietly after lunch without alcohol? In a car, while stopped for a few minutes in traffic?   Total = 9-12/ 24 points   FSS endorsed  at N/A/ 63 points.  GDS -   Social History   Socioeconomic History  . Marital status: Married    Spouse name: Not on file  . Number of children: 2  . Years of education: Not on file  . Highest education level: Associate degree: academic program  Occupational History  . Occupation: Airline pilot  Tobacco Use  . Smoking status: Former Smoker    Types: Cigarettes    Quit date: 03/27/1994    Years since quitting: 26.6  . Smokeless tobacco: Former Neurosurgeon  . Tobacco comment: did chew as a teenager  Vaping Use  . Vaping Use: Never used  Substance and Sexual Activity  . Alcohol use: Not Currently    Alcohol/week: 2.0 standard drinks    Types: 2 Glasses  of wine per week    Comment: daily  . Drug use: No  . Sexual activity: Not on file  Other Topics Concern  . Not on file  Social History Narrative   Lives at home with wife    Right handed   Caffeine: maybe 1 cup/week   Social Determinants of Health   Financial Resource Strain: Not on file  Food Insecurity: Not on file  Transportation Needs: Not on file  Physical Activity: Not on file  Stress: Not on file  Social Connections: Not on file    Family History  Problem Relation Age of Onset  . Breast cancer Mother   . Hyperlipidemia Mother   . Other Mother        ? form of parkinson's   . Bladder Cancer Father   . Parkinson's disease Father   . Memory loss Maternal Grandmother        in her 90s  . Birth defects Maternal Grandfather   . Birth defects Paternal Grandmother   . Colon cancer Neg Hx   . Esophageal cancer Neg Hx   . Rectal cancer Neg Hx   . Stomach cancer Neg Hx   . Colon polyps Neg Hx   . Dementia Neg Hx   . Alzheimer's disease Neg Hx     Past Medical History:  Diagnosis Date  . Allergy   . Anxiety   . Arthritis   . Ascites   . Diabetes (HCC)   . Elevated transaminase level   . Fatigue   . Fatty liver   . Hyperlipidemia   . Hypertension    "under control", not on any blood pressure medication, has lost 60 lbs  . Hypothyroidism    on synthroid  . Inguinal muscle strain   . Insomnia   . Left knee pain    "long time ago"  . Multinodular goiter   . Obesity   . Pneumonia   . Psoriasis   . Tinnitus     Past Surgical History:  Procedure Laterality Date  . APPENDECTOMY  1976  . FOOT SURGERY Bilateral 1975  . NOSE SURGERY    . ROTATOR CUFF REPAIR Left    left  . SHOULDER SURGERY Right 12/20/2019   [facility]      Current Outpatient Medications on File Prior to Visit  Medication Sig Dispense Refill  . azelastine (ASTELIN) 0.1 % nasal spray Place 1 spray into both nostrils 2 (two) times daily. Use in each nostril as directed     . busPIRone (BUSPAR) 15 MG tablet Take 30 mg by mouth 2 (two) times daily.    . Cholecalciferol (VITAMIN D3) 1000 UNITS CAPS Take 1 capsule by mouth daily.     Marland Kitchen  colesevelam (WELCHOL) 625 MG tablet Take 1,875 mg by mouth daily.    Marland Kitchen ezetimibe (ZETIA) 10 MG tablet Take 10 mg by mouth daily.    . fluticasone (FLONASE) 50 MCG/ACT nasal spray Place 1 spray into both nostrils daily as needed for allergies.     . furosemide (LASIX) 40 MG tablet Take 1 tablet (40 mg total) by mouth daily. 30 tablet 5  . ipratropium (ATROVENT) 0.06 % nasal spray Place 1 spray into the nose daily.    . Ixekizumab (TALTZ Schleicher) Inject into the skin every 14 (fourteen) days.    Andrew Patton 500 MG CAPS Take 500 mg by mouth daily.    . Lactulose 20 GM/30ML SOLN Take 30 mLs (20 g total) by mouth in the morning, at noon, and at bedtime. Please titrate this medication to 2-3 soft stools daily, not diarrhea. (Patient taking differently: Take 15 mLs by mouth. Please titrate this medication to 2-3 soft stools daily, not diarrhea.  Take 1-2 times a day.) 2700 mL 0  . levETIRAcetam (KEPPRA XR) 500 MG 24 hr tablet Take 2 tablets (1,000 mg total) by mouth daily. 180 tablet 3  . levocetirizine (XYZAL) 5 MG tablet Take 5 mg by mouth every evening.    Marland Kitchen levothyroxine (SYNTHROID) 50 MCG tablet Take 50 mcg by mouth daily.    . metFORMIN (GLUCOPHAGE) 500 MG tablet Take 1,000 mg by mouth every evening.    . mupirocin cream (BACTROBAN) 2 % Apply 1 application topically 2 (two) times daily. Use BID , 1 week PRN for nasal crusting or scabbing 15 g 0  . Omeprazole Magnesium (PRILOSEC OTC PO) Take 1 tablet by mouth daily.    . ondansetron (ZOFRAN ODT) 4 MG disintegrating tablet Take 1 tablet (4 mg total) by mouth every 8 (eight) hours as needed for nausea or vomiting. 6 tablet 0  . sildenafil (REVATIO) 20 MG tablet Take 1 tablet by mouth as needed.    Marland Kitchen spironolactone (ALDACTONE) 100 MG tablet Take 1 tablet (100 mg total) by mouth daily. 30 tablet  5  . thiamine 100 MG tablet Take 1 tablet (100 mg total) by mouth daily. 90 tablet 0  . vitamin E 1000 UNIT capsule Take 1,000 Units by mouth daily.    Marland Kitchen zolpidem (AMBIEN) 5 MG tablet Take 5 mg by mouth at bedtime as needed for sleep.     No current facility-administered medications on file prior to visit.    Allergies  Allergen Reactions  . Crestor [Rosuvastatin Calcium]     Elevated liver enzymes  . Mobic [Meloxicam] Other (See Comments)    Leg pain  . Penicillins Rash    Has patient had a PCN reaction causing immediate rash, facial/tongue/throat swelling, SOB or lightheadedness with hypotension: No Has patient had a PCN reaction causing severe rash involving mucus membranes or skin necrosis: No Has patient had a PCN reaction that required hospitalization: No Has patient had a PCN reaction occurring within the last 10 years: No If all of the above answers are "NO", then may proceed with Cephalosporin use.    Physical exam:  There were no vitals filed for this visit. There is no height or weight on file to calculate BMI.  BMI :   Wt Readings from Last 3 Encounters:  10/27/20 204 lb 4 oz (92.6 kg)  09/04/20 206 lb (93.4 kg)  08/28/20 210 lb (95.3 kg)     Ht Readings from Last 3 Encounters:  10/27/20 5' 7.75" (1.721 m)  09/04/20 5\' 7"  (1.702 m)  08/28/20 5\' 7"  (1.702 m)      General: The patient is awake, alert and appears not in acute distress. The patient is well groomed. Head: Normocephalic, atraumatic. Neck is with restricted ROM>  . Mallampati 3 , small and crowded lower jaw- ,  neck circumference:17 inches .  Nasal airflow barely patent.  Retrognathia is seen.  Dental status:  Cardiovascular:  Regular rate and cardiac rhythm by pulse,  without distended neck veins. Respiratory: Lungs are clear to auscultation.  Skin:  Without evidence of ankle edema, or rash. Trunk: The patient's posture is erect.   Neurologic exam : The patient is awake and alert, oriented to  place and time.   Memory subjective described as intact.  Attention span & concentration ability appears normal.  Speech is fluent,  without  dysarthria, dysphonia or aphasia.  Mood and affect are appropriate.   Cranial nerves: no loss of smell or taste reported  Pupils are equal and briskly reactive to light. Funduscopic exam deferred.   Extraocular movements in vertical and horizontal planes were intact and without nystagmus. No Diplopia. Visual fields by finger perimetry are intact. Hearing was intact to soft voice and finger rubbing.    Facial sensation intact to fine touch.  Facial motor strength is symmetric and tongue in midline.  Neck ROM : rotation, tilt and flexion extension were normal for age and shoulder shrug was symmetrical.    Motor exam:  Symmetric bulk, tone and ROM.   Normal tone without cog wheeling, symmetric grip strength .   Sensory:  Fine touch and vibration affecting fingers and toes. Reynaud's, and DM neuropathy.  Proprioception tested in the upper extremities was normal.   Coordination: Rapid alternating movements in the fingers/hands were of normal speed.  The Finger-to-nose maneuver was intact without evidence of ataxia, dysmetria or tremor.   Gait and station: Patient could rise unassisted from a seated position, walked without assistive device. Appears parkinsonian, little arm swing, stiff.  Stance is of normal width/ base and the patient turned with 3 steps.   Deep tendon reflexes: in the  upper and lower extremities are symmetric but only trace. Babinski response was deferred.      After spending a total time of  45  minutes face to face and additional time for physical and neurologic examination, review of laboratory studies,  personal review of imaging studies, reports and results of other testing and review of referral information / records as far as provided in visit, I have established the following assessments:  1)  Upper air way anatomy is man  risk factor for OSA along with deviated septum.  2)  Moderate snoring reported by wife, improved  Since he lost some weight. BMI is 31.  3)  Very erratic sleep habits, will start to set a rise time and will restrict daytime naps. . Advance the diuretic to mid afternoon to eliminate some of his bathroom breaks.    My Plan is to proceed with:  1) expended EEG attended sleep study. Needs a sound machine !!!.  2)  Brings his speaker with meditative music.  3) brings his meds.   I would like to thank Dr Lucia GaskinsAhern, MD  and [name], Walter, Md [address] [place] [id number] for allowing me to meet with and to take care of this pleasant patient.   In short, Andrew Patton is presenting with insomnia, free running sleep habits. Snoring, EDS.  I plan to follow up either personally or through our NP within 3 month.    Electronically signed by: Melvyn Novas, MD 11/03/2020 9:56 AM  [facility] Sleep Board certified by [employer] of Sleep Medicine and Fellow of the [employer] of Neurology. Medical Director of [employer].

## 2020-11-06 ENCOUNTER — Telehealth: Payer: Self-pay

## 2020-11-06 NOTE — Telephone Encounter (Signed)
LVM for pt to call me back to schedule sleep study  

## 2020-11-13 ENCOUNTER — Other Ambulatory Visit: Payer: Self-pay | Admitting: Neurology

## 2020-11-13 ENCOUNTER — Telehealth: Payer: Self-pay | Admitting: Neurology

## 2020-11-13 ENCOUNTER — Other Ambulatory Visit (INDEPENDENT_AMBULATORY_CARE_PROVIDER_SITE_OTHER): Payer: Self-pay

## 2020-11-13 DIAGNOSIS — Z0289 Encounter for other administrative examinations: Secondary | ICD-10-CM

## 2020-11-13 DIAGNOSIS — Z79899 Other long term (current) drug therapy: Secondary | ICD-10-CM | POA: Diagnosis not present

## 2020-11-13 NOTE — Telephone Encounter (Addendum)
Spoke with the patient's wife and discussed the message below by Dr. Lucia Gaskins.  The wife understands that Dr. Lucia Gaskins is not convinced that seizures is what is affecting patient's mental status.  He may have something else going on, something metabolic perhaps? We discussed the EEG results and that Dr. Lucia Gaskins questions if cirrhosis could be impacting him even though his ammonia is normal.  I let her know we are rechecking this as well as other lab levels and urine.  We are going to send the results over to Dr. Carolee Rota office.  She is aware the messages were sent to both Dr. Renne Crigler and Dr Barron Alvine.  The wife will call both of their offices to make appointment.  He currently has 1 with Dr. Barron Alvine on May 10 but she will see if he can be seen sooner.  She also stated the sleep study has been scheduled.  The patient saw Dr. Vickey Huger on April 4.  [facility] (72 hour EEG) has not called her but I will call them to follow-up. The pt's wife will bring the patient in for labs today.  She verbalized appreciation and did not have any questions at this time.  Lab results faxed to Dr Renne Crigler at [phone].

## 2020-11-13 NOTE — Telephone Encounter (Signed)
I tried to call [facility] twice this AM but their line is busy. [phone].

## 2020-11-13 NOTE — Telephone Encounter (Signed)
[name]: patient with altered mental status. Can you call wife and have him come in today for blood and urine testing including a keppra level(cbc, cmp, keppra, ammonia, UA and culture, b12, b1)? Also tell her I contacted Andrew Patton's pcp Dr. Renne Crigler and his GI doctor via mychart message and I want his wife to get a follow up scheduled with both doctors because I think something else may be going on other than seizures. Please call wife and discuss the following:   I have him on keppra but I am not convinced his symptoms are solely due to seizures. He has a lot going on, I did some routine blood work and his TSH was elevated and he is being treated for that, I think he has sleep apnea and he is scheduling a sleep test with us(we called and left him a message, tell wife to call back asap and schedule the sleep test). He also had a concussion but that was almost a year ago and MRI of his brain was unremarkable so I'm not sure how much that has anything to do with his symptoms. His EEG did NOT show epileptiform activity but DID show generalized slowing concerning for a systemic metabolic or toxic etiology. I wonder if his cirrhosis could be impacting him even though his ammonia is normal on the lactulose(thats a question for Dr. Barron Alvine)? He is also on a ton of meds and has mood disorders with a history of alcohol abuse I believe. I am hesitant to increase his keppra or change it at this time without definitely knowing his altered mental status is seizures. We have ordered a 72 hour home EEG to further examine(you sent in a request to [facility]) but I really wonder if something else is going on.   Tell her to come in for the labs and urine, tell her to call our office back to schedule the sleep test, ask her if she has the 72 hour eeg scheduled yet, and tell her to make appointments with Dr. Craig Staggers)  and Dr. Mellody Dance doc). Also print this out and fax a copy to Dr. Carolee Rota office please, I cced  him on a mychart message but I am not sure if he gets those emails. As always, acute changes ned to go to the Emergency room. Thanks [name] (c Dr. Renne Crigler and Dr. Barron Alvine)

## 2020-11-15 LAB — URINE CULTURE

## 2020-11-15 LAB — URINALYSIS, ROUTINE W REFLEX MICROSCOPIC
Bilirubin, UA: NEGATIVE
Glucose, UA: NEGATIVE
Ketones, UA: NEGATIVE
Leukocytes,UA: NEGATIVE
Nitrite, UA: NEGATIVE
Protein,UA: NEGATIVE
RBC, UA: NEGATIVE
Specific Gravity, UA: 1.012 (ref 1.005–1.030)
Urobilinogen, Ur: 1 mg/dL (ref 0.2–1.0)
pH, UA: 7 (ref 5.0–7.5)

## 2020-11-17 ENCOUNTER — Telehealth: Payer: Self-pay | Admitting: *Deleted

## 2020-11-17 NOTE — Telephone Encounter (Signed)
Spoke with patient and wife and discussed the lab levels per Dr. Lucia Gaskins.  They are aware the results were sent to Dr. Barron Alvine.  Their questions were answered.  They are aware that I have reached out to [facility] and left a message about the 72-hour EEG scheduling.  They verbalized understanding and appreciation for the call.

## 2020-11-17 NOTE — Telephone Encounter (Signed)
-----   Message from Anson Fret, MD sent at 11/17/2020  1:41 PM EDT ----- Keppra level is fine, I was checking to make sure he was not toxic I'm ok with that level. Urine without infection but it is orange from bilirubin and his liver enzymes are elevated which is likely to his liver disease I can forward to his GI doctor.

## 2020-11-17 NOTE — Telephone Encounter (Signed)
I called [facility] and LVM asking for call back.

## 2020-11-19 ENCOUNTER — Telehealth: Payer: Self-pay | Admitting: General Surgery

## 2020-11-19 NOTE — Telephone Encounter (Signed)
I sent a secure message to the [facility] patient liason, Rusty P. Will update when I hear back.

## 2020-11-19 NOTE — Telephone Encounter (Signed)
Tried to schedule patient with an APP, no schedules available. Pt scheduled for 11/26/2020 @10 :00am. The patient will call back and advise if this day will work. He needs to check with his wife.

## 2020-11-19 NOTE — Telephone Encounter (Signed)
-----   Message from [facility], DO sent at 11/19/2020  3:48 PM EDT ----- Regarding: RE: [facility] patient Yes, lets see if one of the APPs can work him in next week. Otherwise, 4/27 at 10:00. Thanks.   ----- Message ----- From: Edwin Cap, CMA Sent: 11/19/2020   3:32 PM EDT To: Shellia Cleverly, DO Subject: RE: [facility] patient                             According to your schedule you are here only 2 half days next week and in the hospital the following week. Should we try to get him in with an APP?   ----- Message ----- From: Shellia Cleverly, DO Sent: 11/19/2020   7:41 AM EDT To: Edwin Cap, CMA Subject: FW: [facility] patient                             Do we have anything sooner than May 10 for this patient to be seen in [facility] office?  ----- Message ----- From: Anson Fret, MD Sent: 11/13/2020   9:38 AM EDT To: [name], MD, Shellia Cleverly, DO Subject: [facility] patient                                 Dr. Laroy Apple and Dr. Renne Crigler: I am seeing a [facility] patient for episodes of alteration of awareness. I have him on keppra but I am not convinced his symptoms are solely due to seizures. He has a lot going on, I did some routine blood work and his TSH was elevated and he is being treated for that, I think he has sleep apnea and he is scheduling a sleep test with [name]. He also had a concussion but that was almost a year ago and MRI of his brain was unremarkable. His EEG did not show epileptiform activity but did show generalized slowing concerning for a systemic metabolic or toxic etiology. Could his cirrhosis be impacting him even though his ammonia is normal on the lactulose? He is also on a ton of meds. I am hesitant to increase his keppra or change it at this time without definitely knowing this is seizures. I have ordered a 72 hour home EEG to further examine but I really wonder if something else is going on. Would you both be able to follow up with him and take a look?  Thanks.

## 2020-11-20 NOTE — Telephone Encounter (Signed)
I was able to reach [place] at [facility] at [phone]. States they never received anything for this patient. I pulled his records and found the original order on 10/28/2020, along with verification that it want through without issue. I re-faxed it again with confirmation received. I also called the patient's wife back. Left her a message that once insurance approves the testing, the company will contact them to schedule. This process typically takes one week. If they do not hear from them, she was instructed to notify our office.

## 2020-11-24 NOTE — Telephone Encounter (Signed)
Received a fax from [employer] which stated the referral has been received and is being processed by their office.  Once the patient has been successfully scheduled they will reach out again to let us know. For any questions call [phone].

## 2020-11-25 LAB — COMPREHENSIVE METABOLIC PANEL
ALT: 40 IU/L (ref 0–44)
AST: 62 IU/L — ABNORMAL HIGH (ref 0–40)
Albumin/Globulin Ratio: 1.6 (ref 1.2–2.2)
Albumin: 4.4 g/dL (ref 3.8–4.9)
Alkaline Phosphatase: 132 IU/L — ABNORMAL HIGH (ref 44–121)
BUN/Creatinine Ratio: 7 — ABNORMAL LOW (ref 9–20)
BUN: 6 mg/dL (ref 6–24)
Bilirubin Total: 2.6 mg/dL — ABNORMAL HIGH (ref 0.0–1.2)
CO2: 25 mmol/L (ref 20–29)
Calcium: 9.1 mg/dL (ref 8.7–10.2)
Chloride: 97 mmol/L (ref 96–106)
Creatinine, Ser: 0.85 mg/dL (ref 0.76–1.27)
Globulin, Total: 2.8 g/dL (ref 1.5–4.5)
Glucose: 139 mg/dL — ABNORMAL HIGH (ref 65–99)
Potassium: 4.4 mmol/L (ref 3.5–5.2)
Sodium: 138 mmol/L (ref 134–144)
Total Protein: 7.2 g/dL (ref 6.0–8.5)
eGFR: 100 mL/min/{1.73_m2} (ref >59)

## 2020-11-25 LAB — CBC WITH DIFFERENTIAL/PLATELET
Basophils Absolute: 0 10*3/uL (ref 0.0–0.2)
Basos: 1 %
EOS (ABSOLUTE): 0.2 10*3/uL (ref 0.0–0.4)
Eos: 4 %
Hematocrit: 38.7 % (ref 37.5–51.0)
Hemoglobin: 13.7 g/dL (ref 13.0–17.7)
Immature Grans (Abs): 0 10*3/uL (ref 0.0–0.1)
Immature Granulocytes: 0 %
Lymphocytes Absolute: 1.9 10*3/uL (ref 0.7–3.1)
Lymphs: 33 %
MCH: 35.8 pg — ABNORMAL HIGH (ref 26.6–33.0)
MCHC: 35.4 g/dL (ref 31.5–35.7)
MCV: 101 fL — ABNORMAL HIGH (ref 79–97)
Monocytes Absolute: 0.7 10*3/uL (ref 0.1–0.9)
Monocytes: 13 %
Neutrophils Absolute: 2.9 10*3/uL (ref 1.4–7.0)
Neutrophils: 49 %
Platelets: 160 10*3/uL (ref 150–450)
RBC: 3.83 x10E6/uL — ABNORMAL LOW (ref 4.14–5.80)
RDW: 14.2 % (ref 11.6–15.4)
WBC: 5.8 10*3/uL (ref 3.4–10.8)

## 2020-11-25 LAB — METHYLMALONIC ACID, SERUM: Methylmalonic Acid: 92 nmol/L (ref 0–378)

## 2020-11-25 LAB — B12 AND FOLATE PANEL
Folate: 5.2 ng/mL (ref >3.0)
Vitamin B-12: 569 pg/mL (ref 232–1245)

## 2020-11-25 LAB — LEVETIRACETAM LEVEL: Levetiracetam Lvl: 9.7 ug/mL — ABNORMAL LOW (ref 10.0–40.0)

## 2020-11-25 LAB — VITAMIN B1: Thiamine: 177.9 nmol/L (ref 66.5–200.0)

## 2020-11-25 LAB — AMMONIA: Ammonia: 162 ug/dL (ref 40–200)

## 2020-11-26 ENCOUNTER — Encounter: Payer: Self-pay | Admitting: Gastroenterology

## 2020-11-26 ENCOUNTER — Ambulatory Visit (INDEPENDENT_AMBULATORY_CARE_PROVIDER_SITE_OTHER): Payer: BC Managed Care – PPO | Admitting: Neurology

## 2020-11-26 ENCOUNTER — Other Ambulatory Visit: Payer: Self-pay

## 2020-11-26 ENCOUNTER — Ambulatory Visit (INDEPENDENT_AMBULATORY_CARE_PROVIDER_SITE_OTHER): Payer: BC Managed Care – PPO | Admitting: Gastroenterology

## 2020-11-26 VITALS — BP 100/56 | HR 65 | Ht 67.5 in | Wt 198.2 lb

## 2020-11-26 DIAGNOSIS — R748 Abnormal levels of other serum enzymes: Secondary | ICD-10-CM

## 2020-11-26 DIAGNOSIS — K7031 Alcoholic cirrhosis of liver with ascites: Secondary | ICD-10-CM | POA: Diagnosis not present

## 2020-11-26 DIAGNOSIS — K3189 Other diseases of stomach and duodenum: Secondary | ICD-10-CM

## 2020-11-26 DIAGNOSIS — K766 Portal hypertension: Secondary | ICD-10-CM

## 2020-11-26 DIAGNOSIS — F0781 Postconcussional syndrome: Secondary | ICD-10-CM

## 2020-11-26 DIAGNOSIS — G4710 Hypersomnia, unspecified: Secondary | ICD-10-CM | POA: Diagnosis not present

## 2020-11-26 DIAGNOSIS — R404 Transient alteration of awareness: Secondary | ICD-10-CM | POA: Diagnosis not present

## 2020-11-26 DIAGNOSIS — R0683 Snoring: Secondary | ICD-10-CM

## 2020-11-26 DIAGNOSIS — G40209 Localization-related (focal) (partial) symptomatic epilepsy and epileptic syndromes with complex partial seizures, not intractable, without status epilepticus: Secondary | ICD-10-CM

## 2020-11-26 DIAGNOSIS — G4701 Insomnia due to medical condition: Secondary | ICD-10-CM

## 2020-11-26 NOTE — Progress Notes (Signed)
P  Chief Complaint:    Cirrhosis, confusion  GI History: 60 year old male with a history of psoriatic arthritis (previously treated with Enbrel,nowCosentyx), hypertension, hyperlipidemia, and history of alcohol abuse,diagnosed with cirrhosis in 03/2019 on hospital admission (see prior notes for summary of hospitalization work-up).   Cirrhosis Evaluation: -Etiology: Steatohepatitis; suspected 2/2 EtOH, but also RFs for NASHto include hypertension, hyperlipidemia, central adiposity/history of obesity -Complications: Ascites, portal hypertensive gastropathy -HCC screening: RUQ [name] 06/2020: Cirrhosis, no HCC.  AFP normal 08/2020 -Variceal screening:EGD 04/2019 (no varices) -Serologic evaluation: Completed 03/2019-all negative -Viral hepatitis vaccination:UTD; HAVAb+, HBsAb+ -Flu vaccine: Declines -Liver biopsy: None todate -Medications: Lasix 40 mg, Aldactone 100 mg, lactulose (not taking) -MELD:18 -Child Pugh score:B (8points)   Endoscopic history: - Colonoscopy (01/2013, Dr. Jarold Motto): Normal. Repeat in 10 years -EGD (04/2019, Dr. Barron Alvine): Normal esophagus, moderatePHG, normal duodenum   HPI:     Patient is a 60 y.o. male presenting to the Gastroenterology Clinic for follow-up.  Last seen by me on 08/28/2020.  Main issue at that time was fogginess and intermittent confusion.  Unfortunately, was also still drinking a couple glasses of wine/Prosecco each night.  Restarted his lactulose.  Since his last appointment with me, has been seen in the Neurology clinic.  Episodic alteration of awareness not felt solely due to seizures (on Keppra).  TSH was elevated (6.9) and was also set up for sleep test to evaluate for OSA.  MRI brain in 09/2020 unremarkable and EEG did not show epileptiform activity but did show generalized slowing concerning for systemic metabolic or toxic etiology.  72-hour home EEG ordered.  Recommended no driving.  Today, he states he feels  like he has been improving lately with meds (changed to Keppra extended release, modified Buspar timing) and rest, and wife agrees. Scheduled for sleep study tonight, then 72-hour EEG on 12/08/20.   He has now completely stopped all EtOH.  Rarely takes Lactulose (has 2-3 BM at baseline without Lactulose, then diarrhea when he takes it). Not on rifaximin.    Labs from 11/13/2020: - MCV 101, otherwise normal CBC - CMP essentially stable with AST/ALT 60/40, T bili 2.6, albumin 4.4, ALP 132, creatinine 0.85, Na 138.  Normal UA - Ammonia 162 (normal range on that panel) - Keppra level not supratherapeutic/toxic - Folate 5.2, B12 normal, thiamine normal    Review of systems:     No chest pain, no SOB, no fevers, no urinary sx   Past Medical History:  Diagnosis Date  . Allergy   . Anxiety   . Arthritis   . Ascites   . Diabetes (HCC)   . Elevated transaminase level   . Fatigue   . Fatty liver   . Hyperlipidemia   . Hypertension    "under control", not on any blood pressure medication, has lost 60 lbs  . Hypothyroidism    on synthroid  . Inguinal muscle strain   . Insomnia   . Left knee pain    "long time ago"  . Multinodular goiter   . Obesity   . Pneumonia   . Psoriasis   . Tinnitus     Patient's surgical history, family medical history, social history, medications and allergies were all reviewed in Epic    Current Outpatient Medications  Medication Sig Dispense Refill  . azelastine (ASTELIN) 0.1 % nasal spray Place 1 spray into both nostrils 2 (two) times daily. Use in each nostril as directed    . busPIRone (BUSPAR) 15 MG tablet Take 30  mg by mouth 2 (two) times daily.    . Cholecalciferol (VITAMIN D3) 1000 UNITS CAPS Take 1 capsule by mouth daily.     . colesevelam (WELCHOL) 625 MG tablet Take 1,875 mg by mouth daily.    Marland Kitchen ezetimibe (ZETIA) 10 MG tablet Take 10 mg by mouth daily.    . fluticasone (FLONASE) 50 MCG/ACT nasal spray Place 1 spray into both nostrils daily as  needed for allergies.     . furosemide (LASIX) 40 MG tablet Take 1 tablet (40 mg total) by mouth daily. 30 tablet 5  . ipratropium (ATROVENT) 0.06 % nasal spray Place 1 spray into the nose daily.    . Ixekizumab (TALTZ [place]) Inject into the skin every 14 (fourteen) days.    Boris Lown Oil 500 MG CAPS Take 500 mg by mouth daily.    . Lactulose 20 GM/30ML SOLN Take 30 mLs (20 g total) by mouth in the morning, at noon, and at bedtime. Please titrate this medication to 2-3 soft stools daily, not diarrhea. (Patient taking differently: Take 15 mLs by mouth. Please titrate this medication to 2-3 soft stools daily, not diarrhea.  Take 1-2 times a day.) 2700 mL 0  . levETIRAcetam (KEPPRA XR) 500 MG 24 hr tablet Take 2 tablets (1,000 mg total) by mouth daily. 180 tablet 3  . levocetirizine (XYZAL) 5 MG tablet Take 5 mg by mouth every evening.    Marland Kitchen levothyroxine (SYNTHROID) 50 MCG tablet Take 50 mcg by mouth daily.    . metFORMIN (GLUCOPHAGE) 500 MG tablet Take 1,000 mg by mouth every evening.    . mupirocin cream (BACTROBAN) 2 % Apply 1 application topically 2 (two) times daily. Use BID , 1 week PRN for nasal crusting or scabbing 15 g 0  . Omeprazole Magnesium (PRILOSEC OTC PO) Take 1 tablet by mouth daily.    . ondansetron (ZOFRAN ODT) 4 MG disintegrating tablet Take 1 tablet (4 mg total) by mouth every 8 (eight) hours as needed for nausea or vomiting. 6 tablet 0  . sildenafil (REVATIO) 20 MG tablet Take 1 tablet by mouth as needed.    Marland Kitchen spironolactone (ALDACTONE) 100 MG tablet Take 1 tablet (100 mg total) by mouth daily. 30 tablet 5  . thiamine 100 MG tablet Take 1 tablet (100 mg total) by mouth daily. 90 tablet 0  . vitamin E 1000 UNIT capsule Take 1,000 Units by mouth daily.    Marland Kitchen zolpidem (AMBIEN) 5 MG tablet Take 5 mg by mouth at bedtime as needed for sleep.     No current facility-administered medications for this visit.    Physical Exam:     BP (!) 100/56   Pulse 65   Ht 5' 7.5" (1.715 m)   Wt  198 lb 4 oz (89.9 kg)   BMI 30.59 kg/m   GENERAL:  Pleasant male in NAD PSYCH: : Cooperative, normal affect NEURO: Alert and oriented x 3, no focal neurologic deficits, no asterixis   IMPRESSION and PLAN:    1) EtOH cirrhosis 2) Ascites 3) Portal hypertensive gastropathy  - Congratulated him on stopping EtOH since last appointment and encouraged him to continue complete cessation of all alcohol - If he can remain abstinent, and if ongoing elevated MELD score, plan for referral to Transplant Hepatology for evaluation -Start folic acid 1 mg/day - Continue lactulose.  Only takes intermittently as he has 2-3 soft stools per day at baseline - Did discuss potentially starting rifaximin.  Recent ammonia otherwise normal.  Ultimately elected to hold off on the medication while going through multiple adjustments for altered awareness as outlined below.  If completely unrevealing work-up, may consider starting rifaximin at that juncture - Repeat labs in 3 months for recalculation of MELD/CTP - Continue low-sodium diet - UTD on HCC screening - Continue Lasix/Aldactone      4) altered sensorium -Episodic delirium, but feels better lately.  Extensive work-up currently in progress by his Neurologist.  Ammonia levels otherwise normal. - Recent EEG with no epileptiform activity but did show generalized slowing concerning for systemic metabolic or toxic etiology.  Has 72-hour home EEG scheduled. -Sleep study scheduled for this evening -Has had medication adjustments which he thinks have improved -Placed consult to Pharmacy to assist in evaluation for medication interactions, hepatic dosing, etc. in the setting of cirrhosis and polypharmacy  RTC in 3-4 months or sooner as needed  I spent 40 minutes of time, including in depth chart review, independent review of results as outlined above, communicating results with the patient directly, face-to-face time with the patient, coordinating care, ordering  studies and medications as appropriate, and documentation.        Verlin Dike [employer] Demicco ,DO, FACG 11/26/2020, 10:13 AM

## 2020-11-26 NOTE — Patient Instructions (Signed)
If you are age 60 or older, your body mass index should be between 23-30. Your Body mass index is 30.59 kg/m. If this is out of the aforementioned range listed, please consider follow up with your Primary Care Provider.  If you are age 48 or younger, your body mass index should be between 19-25. Your Body mass index is 30.59 kg/m. If this is out of the aformentioned range listed, please consider follow up with your Primary Care Provider.   Your provider has requested that you go to the basement level for lab work in 3 months anytime after 02/25/2021. Press "B" on the elevator. The lab is located at the first door on the left as you exit the elevator.'  Please contact the office to follow up in 3-4 months, [phone].  Due to recent changes in healthcare laws, you may see the results of your imaging and laboratory studies on MyChart before your provider has had a chance to review them.  We understand that in some cases there may be results that are confusing or concerning to you. Not all laboratory results come back in the same time frame and the provider may be waiting for multiple results in order to interpret others.  Please give [name] 48 hours in order for your provider to thoroughly review all the results before contacting the office for clarification of your results.   Start over the counter Folic Acid 1mg  daily.  Thank you for choosing me and [facility].  Vito Cirigliano, D.O.

## 2020-11-27 NOTE — Progress Notes (Signed)
[facility]  HOME SLEEP TEST (Watch PAT)  STUDY DATE: 11/27/20  DOB: Apr 19, 1961  MRN: [id number]  ORDERING CLINICIAN: Melvyn Novas, MD   REFERRING CLINICIAN: Dr. Lucia Gaskins CLINICAL INFORMATION/HISTORY:  11/03/20: Insomnia work up.Sleepiness in daytime.  Deviated septum nasi, sinus surgery. Falling asleep watching TV ,staying asleep up to 2 hours before retreating to the bedroom.   Andrew Patton  has a past medical history of Allergies - hayfever, Anxiety, Psoriasis -Arthritis, Ascites, Diabetes mellitus type - 2 (HCC), Elevated transaminase level, Fatigue, Fatty liver, Hyperlipidemia, Hypertension, Hypothyroidism, Inguinal muscle strain, Insomnia, Left knee pain, Multinodular goiter, Obesity, Pneumonia, Psoriasis, and Tinnitus.  Epworth sleepiness score: 12/24.  BMI: 23.2 kg/m  Neck Circumference: 17 "  FINDINGS:   Total Record Time (hours, min): 9 h 30 min  Total Sleep Time (hours, min):  8 h 16 min  Percent REM (%):    5.30 %   Calculated pAHI (per hour): 1.2    REM pAHI: N/A    NREM pAHI: N/A Supine AHI: 1.5   Oxygen Saturation (%) Mean: 95  Minimum oxygen saturation (%):         89   O2 Saturation Range (%): 89-98  O2Saturation (minutes) <=88%: 0 min  Pulse Mean (bpm):    63    IMPRESSION: This HST indicated no OSA (obstructive sleep apnea) to be present. The hypnogram indicted normal pulse rate variability. Snoring channel was activated- moderate snoring was present.  RECOMMENDATION: This HST indicated no Insomnia to be present, the overall sleep time was normal, no apnea is present either. Snoring can be treated with a dental device. My only other suggestion is to work on sleep habits , routines, and exercise.     INTERPRETING PHYSICIAN:  Melvyn Novas, MD    [facility] Sleep Board certified by The [employer] of Sleep Medicine and Diplomate of the [employer] of Sleep Medicine. Board certified In  Neurology through the ABPN, Fellow of the [employer] of Neurology. Medical Director of [employer].   Sleep Summary  Oxygen Saturation Statistics   Start Study Time: End Study Time: Total Recording Time:          11:46:42 PM 9:17:32 AM 9 h, 30 min  Total Sleep Time % REM of Sleep Time:  8 h, 16 min  5.3    Mean: 95 Minimum: 89 Maximum: 98  Mean of Desaturations Nadirs (%):   90  Oxygen Desatur. %: 4-9 10-20 >20 Total  Events Number Total  2 100.0  0 0.0  0 0.0  2 100.0  Oxygen Saturation: <90 <=88 <85 <80 <70  Duration (minutes): Sleep % 0.0 0.0 0.0 0.0 0.0 0.0 0.0 0.0 0.0 0.0     Respiratory Indices      Total Events REM NREM All Night  pRDI: pAHI 3%: ODI 4%: pAHIc 3%: % CSR: pAHI 4%:  19  10  2   0 0.0 2 N/A N/A N/A N/A N/A N/A N/A N/A 2.3 1.2 0.2 0.0 0.2       Pulse Rate Statistics during Sleep (BPM)      Mean: 63 Minimum: 54 Maximum: 85        Body Position Statistics  Position Supine Prone Right Left Non-Supine  Sleep (min) 160.0 0.0 15.5 320.8 336.3  Sleep % 32.2 0.0 3.1 64.6 67.8  pRDI 2.6 N/A 0.0 2.2 2.1  pAHI 3% 1.5 N/A 0.0 1.1 1.1  ODI 4% 0.8 N/A 0.0 0.0 0.0  Snoring Statistics Snoring Level (dB) >40 >50 >60 >70 >80 >Threshold (45)  Sleep (min) 320.4 6.7 3.6 0.0 0.0 17.4  Sleep % 64.6 1.3 0.7 0.0 0.0 3.5    Mean: 41 dB

## 2020-11-28 DIAGNOSIS — G4701 Insomnia due to medical condition: Secondary | ICD-10-CM | POA: Insufficient documentation

## 2020-11-28 DIAGNOSIS — R0683 Snoring: Secondary | ICD-10-CM | POA: Insufficient documentation

## 2020-11-28 NOTE — Procedures (Signed)
[facility]  HOME SLEEP TEST (Watch PAT)  STUDY DATE: 11/27/20  DOB: [birth year]  MRN: [id number]  ORDERING CLINICIAN: Melvyn Novas, MD   REFERRING CLINICIAN: Dr. Lucia Gaskins CLINICAL INFORMATION/HISTORY:  11/03/20: Insomnia work up.Sleepiness in daytime.  Deviated septum nasi, sinus surgery. Falling asleep watching TV ,staying asleep up to 2 hours before retreating to the bedroom.   Andrew Patton  has a past medical history of Allergies - hayfever, Anxiety, Psoriasis -Arthritis, Ascites, seizures, TBI,  Diabetes mellitus type - 2 (HCC), Elevated transaminase level, Fatigue, Fatty liver, Hyperlipidemia, Hypertension, Hypothyroidism, Inguinal muscle strain, Insomnia, Left knee pain, Multinodular goiter, Obesity, Pneumonia, Psoriasis, and Tinnitus.  Epworth sleepiness score: 12/24.  BMI: 23.2 kg/m  Neck Circumference: 17 "  FINDINGS:   Total Record Time (hours, min): 9 h 30 min  Total Sleep Time (hours, min):  8 h 16 min  Percent REM (%):    5.30 %   Calculated pAHI (per hour): 1.2    REM pAHI: N/A    NREM pAHI: N/A Supine AHI: 1.5   Oxygen Saturation (%) Mean: 95  Minimum oxygen saturation (%):         89   O2 Saturation Range (%): 89-98  O2Saturation (minutes) <=88%: 0 min  Pulse Mean (bpm):    63  Pulse Range (55-89)   IMPRESSION: This HST indicated no OSA (obstructive sleep apnea) to be present. The hypnogram indicted normal pulse rate variability. Snoring channel was activated- moderate snoring was present.  RECOMMENDATION: This HST indicated no Insomnia to be present, the overall sleep time was normal, no apnea is present either. Snoring can be treated with a dental device. My only other suggestion is to work on sleep habits , routines, and exercise.     INTERPRETING PHYSICIAN:  Melvyn Novas, MD    [facility] Sleep Board certified by The [employer] of Sleep Medicine and Diplomate of the [employer] of Sleep  Medicine. Board certified In Neurology through the ABPN, Fellow of the [employer] of Neurology. Medical Director of [employer].   Sleep Summary  Oxygen Saturation Statistics   Start Study Time: End Study Time: Total Recording Time:          11:46:42 PM 9:17:32 AM 9 h, 30 min  Total Sleep Time % REM of Sleep Time:  8 h, 16 min  5.3    Mean: 95 Minimum: 89 Maximum: 98  Mean of Desaturations Nadirs (%):   90  Oxygen Desatur. %: 4-9 10-20 >20 Total  Events Number Total  2 100.0  0 0.0  0 0.0  2 100.0  Oxygen Saturation: <90 <=88 <85 <80 <70  Duration (minutes): Sleep % 0.0 0.0 0.0 0.0 0.0 0.0 0.0 0.0 0.0 0.0     Respiratory Indices      Total Events REM NREM All Night  pRDI: pAHI 3%: ODI 4%: pAHIc 3%: % CSR: pAHI 4%:  19  10  2   0 0.0 2 N/A N/A N/A N/A N/A N/A N/A N/A 2.3 1.2 0.2 0.0 0.2       Pulse Rate Statistics during Sleep (BPM)      Mean: 63 Minimum: 54 Maximum: 85        Body Position Statistics  Position Supine Prone Right Left Non-Supine  Sleep (min) 160.0 0.0 15.5 320.8 336.3  Sleep % 32.2 0.0 3.1 64.6 67.8  pRDI 2.6 N/A 0.0 2.2 2.1  pAHI 3% 1.5 N/A 0.0 1.1 1.1  ODI 4%  0.8 N/A 0.0 0.0 0.0     Snoring Statistics Snoring Level (dB) >40 >50 >60 >70 >80 >Threshold (45)  Sleep (min) 320.4 6.7 3.6 0.0 0.0 17.4  Sleep % 64.6 1.3 0.7 0.0 0.0 3.5

## 2020-11-28 NOTE — Progress Notes (Signed)
IMPRESSION: This HST indicated no OSA (obstructive sleep apnea) to be present. The hypnogram indicted normal pulse rate variability. Snoring channel was activated- moderate snoring was present.  RECOMMENDATION: This HST indicated no Insomnia to be present, the overall sleep time was normal, no apnea is present either. Snoring can be treated with a dental device. My only other suggestion is to work on sleep habits , routines, and exercise.  PS : If the patient feels that this HST did not reflect his sleep at night accurately, I will follow up with an attended sleep study, expanded EEG montage.

## 2020-12-08 DIAGNOSIS — G40209 Localization-related (focal) (partial) symptomatic epilepsy and epileptic syndromes with complex partial seizures, not intractable, without status epilepticus: Secondary | ICD-10-CM | POA: Diagnosis not present

## 2020-12-09 ENCOUNTER — Telehealth: Payer: Self-pay | Admitting: Neurology

## 2020-12-09 ENCOUNTER — Ambulatory Visit: Payer: BC Managed Care – PPO | Admitting: Gastroenterology

## 2020-12-09 DIAGNOSIS — G40209 Localization-related (focal) (partial) symptomatic epilepsy and epileptic syndromes with complex partial seizures, not intractable, without status epilepticus: Secondary | ICD-10-CM | POA: Diagnosis not present

## 2020-12-09 NOTE — Telephone Encounter (Signed)
-----   Message from Anson Fret, MD sent at 11/28/2020 12:59 PM EDT ----- I would really like him to have an attended sleep study with expanded EEG montage if possible. I am concerned for this patient, he is very lethargic, sleepy, episodic delirium. He is having episodes that I am not sure are seizures or not;  his EEG was slowed indicating some sort of metabolic or other cerebral dysfunction of unknown cause. He had a concussion but that was over a year ago and mri of his brain unremarkable. I sent him back to his primary care and his GI doctor again to ensure no medical, or otherwise, reasons. He has cirrhosis with asciites which is followed by GI, on lactulose, he stopped using EtOH.  I found his tsh to be low and he is being treated for that too. I have him on Keppra for what sounds like complex partial seiures. Do you think an attended sleep study with EEG would helpful?  ----- Message ----- From: Melvyn Novas, MD Sent: 11/28/2020  11:33 AM EDT To: Judi Cong, RN, Anson Fret, MD  IMPRESSION: This HST indicated no OSA (obstructive sleep apnea) to be present. The hypnogram indicted normal pulse rate variability. Snoring channel was activated- moderate snoring was present.  RECOMMENDATION: This HST indicated no Insomnia to be present, the overall sleep time was normal, no apnea is present either. Snoring can be treated with a dental device. My only other suggestion is to work on sleep habits , routines, and exercise.  PS : If the patient feels that this HST did not reflect his sleep at night accurately, I will follow up with an attended sleep study, expanded EEG montage.

## 2020-12-09 NOTE — Telephone Encounter (Signed)
Called the patient to review sleep study results.  Reviewed the home sleep test in detail with the patient.  Advised that based off of the study overall he had good sleep, no concerns for sleep apnea and no issues with heart rate or oxygen.  Advised the patient initially Dr. Vickey Huger really wanted to bring him in for a in lab study unfortunately insurance would not cover this.  Patient is currently completing the neurovative diagnostic EEG monitoring which will hopefully allow to assess if anything was to occur through the night.  Patient verbalized understanding of the results and had no further questions at this time.  Offered a follow-up appointment if needed, however advised from the sleep clinic standpoint there would be no need unless he wanted.  Patient was appreciative for the call.

## 2020-12-10 DIAGNOSIS — G40209 Localization-related (focal) (partial) symptomatic epilepsy and epileptic syndromes with complex partial seizures, not intractable, without status epilepticus: Secondary | ICD-10-CM | POA: Diagnosis not present

## 2020-12-11 DIAGNOSIS — G40209 Localization-related (focal) (partial) symptomatic epilepsy and epileptic syndromes with complex partial seizures, not intractable, without status epilepticus: Secondary | ICD-10-CM | POA: Diagnosis not present

## 2020-12-22 DIAGNOSIS — E785 Hyperlipidemia, unspecified: Secondary | ICD-10-CM | POA: Diagnosis not present

## 2020-12-22 DIAGNOSIS — R946 Abnormal results of thyroid function studies: Secondary | ICD-10-CM | POA: Diagnosis not present

## 2020-12-22 DIAGNOSIS — E1169 Type 2 diabetes mellitus with other specified complication: Secondary | ICD-10-CM | POA: Diagnosis not present

## 2020-12-22 DIAGNOSIS — E041 Nontoxic single thyroid nodule: Secondary | ICD-10-CM | POA: Diagnosis not present

## 2020-12-23 NOTE — Telephone Encounter (Signed)
Received 72 hour EEG results.  Physician Conclusion/Impression: This is awake and Ambulatory EEG was within broad range of normal limits.  No focal, lateralized or epileptiform features were noted.  The one episode of theta generalized bursts during the awake state is nonspecific finding.  The patient logged 0 events and there were 0 "patient event" button pushes noted.  Impression: This ambulatory EEG is within normal limits.  Although an electroencephalogram is normal does not rule out the possibility of seizure disorder.  Clinical correlation recommended.  Signed by Dr. Geraldine Solar

## 2020-12-23 NOTE — Telephone Encounter (Signed)
Spoke with patient at [phone] and advised 72 hour EEG was within normal limits, nothing concerning. If he had an episode during that time it was not a seizure. Pt stated he didn't have any episodes during the EEG. He verbalized appreciation for the call.

## 2020-12-23 NOTE — Telephone Encounter (Signed)
Yes, EEG was within normal limits. If he had an episode during this time, it was not a seizure. thanks

## 2021-01-12 DIAGNOSIS — F419 Anxiety disorder, unspecified: Secondary | ICD-10-CM | POA: Diagnosis not present

## 2021-01-21 DIAGNOSIS — L409 Psoriasis, unspecified: Secondary | ICD-10-CM | POA: Diagnosis not present

## 2021-01-21 DIAGNOSIS — M1990 Unspecified osteoarthritis, unspecified site: Secondary | ICD-10-CM | POA: Diagnosis not present

## 2021-01-21 DIAGNOSIS — Z79899 Other long term (current) drug therapy: Secondary | ICD-10-CM | POA: Diagnosis not present

## 2021-01-21 DIAGNOSIS — L4050 Arthropathic psoriasis, unspecified: Secondary | ICD-10-CM | POA: Diagnosis not present

## 2021-01-27 DIAGNOSIS — F419 Anxiety disorder, unspecified: Secondary | ICD-10-CM | POA: Diagnosis not present

## 2021-01-28 ENCOUNTER — Encounter: Payer: Self-pay | Admitting: Neurology

## 2021-01-28 ENCOUNTER — Ambulatory Visit: Payer: BC Managed Care – PPO | Admitting: Neurology

## 2021-01-28 VITALS — BP 109/64 | HR 69 | Ht 67.5 in | Wt 189.0 lb

## 2021-01-28 DIAGNOSIS — Z87820 Personal history of traumatic brain injury: Secondary | ICD-10-CM

## 2021-01-28 DIAGNOSIS — R419 Unspecified symptoms and signs involving cognitive functions and awareness: Secondary | ICD-10-CM

## 2021-01-28 DIAGNOSIS — E039 Hypothyroidism, unspecified: Secondary | ICD-10-CM | POA: Diagnosis not present

## 2021-01-28 DIAGNOSIS — R413 Other amnesia: Secondary | ICD-10-CM | POA: Diagnosis not present

## 2021-01-28 DIAGNOSIS — E1165 Type 2 diabetes mellitus with hyperglycemia: Secondary | ICD-10-CM

## 2021-01-28 DIAGNOSIS — F0781 Postconcussional syndrome: Secondary | ICD-10-CM

## 2021-01-28 DIAGNOSIS — G40209 Localization-related (focal) (partial) symptomatic epilepsy and epileptic syndromes with complex partial seizures, not intractable, without status epilepticus: Secondary | ICD-10-CM

## 2021-01-28 NOTE — Patient Instructions (Addendum)
Formal memory testing Will send to OT for cognitive therapy  Memory Compensation Strategies  Use "WARM" strategy.  W= write it down  A= associate it  R= repeat it  M= make a mental note  2.   You can keep a Glass blower/designer.  Use a 3-ring notebook with sections for the following: calendar, important names and phone numbers,  medications, doctors' names/phone numbers, lists/reminders, and a section to journal what you did  each day.   3.    Use a calendar to write appointments down.  4.    Write yourself a schedule for the day.  This can be placed on the calendar or in a separate section of the Memory Notebook.  Keeping a  regular schedule can help memory.  5.    Use medication organizer with sections for each day or morning/evening pills.  You may need help loading it  6.    Keep a basket, or pegboard by the door.  Place items that you need to take out with you in the basket or on the pegboard.  You may also want to  include a message board for reminders.  7.    Use sticky notes.  Place sticky notes with reminders in a place where the task is performed.  For example: " turn off the  stove" placed by the stove, "lock the door" placed on the door at eye level, " take your medications" on  the bathroom mirror or by the place where you normally take your medications.  8.    Use alarms/timers.  Use while cooking to remind yourself to check on food or as a reminder to take your medicine, or as a  reminder to make a call, or as a reminder to perform another task, etc.

## 2021-01-28 NOTE — Progress Notes (Signed)
[facility]    Provider:  Dr Lucia Gaskins Requesting Provider: [name], MD Primary Care Provider:  [name], MD  CC:  Memory loss, seizures, concussions  01/29/2021: He is feeling much,much better. He has lost weight. He is doing well with seizure medication and no seizures. He is dealing with a lot of anxiety and depression and he is seeing [name] working with medications and therapy. He is managing his thyroid. He has lost a lot of weight, his hgba1c is down, he is cutting down on alcohol, he has occ drinks but not like it was before. He is doing better. Right now he fights short-term memory issues. He is here with his sister today (I treated sister's husband inpatient for stroke in 2020).   Interval history 10/27/2020: MRI of the brain was normal for age. EEG showed no epileptiform activity. On Keppra. They have adjusted his thyroid medication and his hgba1c. He snores at night. He does have a deviated septum. He does not sleep well at night. He wakes frequently. He sees an ENT. No moe episodes on the Keppra. But he is very tired. He is closing his eyes dyring the appointment. He does snore, his ESS is 10. Being on the keppra has caused him a lot of zoning out. The medication makes him very tired. And then he wakes up more. When he started the medication he was a zombie.They are scheduled to go to [place] in July. He naps during the day.I would supposrt short-term disability, EEG was slowed. 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 It can be very dangerous. He has sensitivity to light, it is worse, since the concussion. He has had eye exams.   HPI:  Andrew Patton is a 60 y.o. male here as requested by [name], MD([facility]) for memory loss.  Past medical history of diabetes, elevated transaminases secondary to fatty liver, hyperlipidemia, hypothyroidism, insomnia, hypertension, obesity, psoriasis, fatigue, osteoarthritis,  diabetes with hypertension and hypercholesterolemia.  I requested his pcp's notes from Dr. Renne Crigler as well.   Patient is here with his wife who provides most information, he is having episodes of confusion, he had a concussion in July 2021 and since then he had several episodes where he would not know where he is going, wife would make him pull over. A "glazed look" in his eye. They started possibly in September of 2021, he had an episode in a restaurant where he got a glazed look in his face, he was confused and disoriented, lasting 5-10 minutes, slowly resolve, he says he remembers these. This year January 8th they were at a winery and they were there at 12:15 maybe been there 20-25 minutes and sitting talking at a table and he was eating nuts, he had not had anything to eat yet, he took his shirt off, he said he was going to bed, he wants to go lay down, didn;t know where he was, thought he was at home, they got him into a chair in the office and wanted to get up and wander. They checked heart rate and pulse, it was fine, no loss of consciousness, he would not answer questions such as "do you know who I am", no focal weakness at the time, no speech slurring, they called the EMT and his glucose was 242.  He has had a couple other smaller episodes where he was more attentive but disoriented still. The one on January 8th of this year lasted 25  minutes and slowly resolved like "waking up from a dream". His ammonia level was elevated. The last episode was on the 18th of January, they went to the store and he did not look right, he had the grocery cart and kept running into the sides.     I reviewed [name] notes:  he is a 60 year old male with history of prediabetes, liver disease, arthritis that complains of multiple episodes of disorientation and memory last, he had a traumatic brain injury in July 2021 but acute imaging CT of the brain showed no acute findings, since then he has had multiple episodes most  recently earlier this month of 25 to 30 minutes of disorientation where his wife noticed he was staring off and not his normal self, no focal seizure activity, no loss of consciousness, no focal weakness, numbness or tingling, I also reviewed Dr. Dorthula Nettles notes, it appears patient was seen on August 13, 2020, patient reported during that visit that he was somewhere and had not eaten that morning, he had a glass of [place], he was eating nuts and berries, when he got up and started walking he wandered around the winery, was completely "not there" for 25 minutes, he woke up in the office, he had buspirone, Lasix and WelChol, per his wife at 1255 he stood up and took off his shirt, very disoriented, agitated and confused not verbal no seizures, EMT arrived and blood sugar was 242, was not seen by the doctor at the emergency room, EKG was reported to be normal, he went to the ER but decided to skip being seen by MD due to the weight.  Labs drawn the same day included a CBC which was unremarkable, it appears ammonia was also taken, he has elevated transaminases secondary to fatty liver, CMP that was collected July 10, 2020 showed an elevated glucose, elevated AST at 94 glucose 135, BUN 9, creatinine 0.9 otherwise unremarkable, hemoglobin A1c July 10, 2020 7.5, free T4 July 10, 2020 1.47, LDL 180, TSH elevated 6.47 July 10, 2020.   I also reviewed notes from the emergency room in July 2021 where he presented for evaluation of headache, nausea, fatigue, photophobia, pain to the right eye after mechanical fall 9 days prior, he was walking in the middle the night from the bedroom to the bathroom when he tripped and fell hitting his face and head against the dresser.  CT of the head did not show anything acute.   Reviewed notes, labs and imaging from outside physicians, which showed:  CT of the head February 08, 2020: Showed generalized mild atrophy, possibly a 9 mm right frontal lobe calcification versus tiny  meningioma, personally reviewed images and agree with findings. Review of Systems: Patient complains of symptoms per HPI as well as the following symptoms: short-term memory loss . Pertinent negatives and positives per HPI. All others negative     Social History   Socioeconomic History   Marital status: Married    Spouse name: Not on file   Number of children: 2   Years of education: Not on file   Highest education level: Associate degree: academic program  Occupational History   Occupation: sales  Tobacco Use   Smoking status: Former    Pack years: 0.00    Types: Cigarettes    Quit date: 03/27/1994    Years since quitting: 26.8   Smokeless tobacco: Former    Types: Chew   Tobacco comments:    did chew as a teenager  Vaping  Use   Vaping Use: Never used  Substance and Sexual Activity   Alcohol use: Yes    Alcohol/week: 2.0 standard drinks    Types: 2 Glasses of wine per week    Comment: "very little"   Drug use: No   Sexual activity: Not on file  Other Topics Concern   Not on file  Social History Narrative   Lives at home with wife    Right handed   Caffeine: maybe 1-3 cups/week   Social Determinants of Health   Financial Resource Strain: Not on file  Food Insecurity: Not on file  Transportation Needs: Not on file  Physical Activity: Not on file  Stress: Not on file  Social Connections: Not on file  Intimate Partner Violence: Not on file    Family History  Problem Relation Age of Onset   Breast cancer Mother    Hyperlipidemia Mother    Other Mother        ? form of parkinson's    Parkinsonism Mother    Bladder Cancer Father    Parkinson's disease Father    Memory loss Maternal Grandmother        in her 81s   Birth defects Maternal Grandfather    Birth defects Paternal Grandmother    Colon cancer Neg Hx    Esophageal cancer Neg Hx    Rectal cancer Neg Hx    Stomach cancer Neg Hx    Colon polyps Neg Hx    Dementia Neg Hx    Alzheimer's disease Neg  Hx     Past Medical History:  Diagnosis Date   Allergy    Anxiety    Arthritis    Ascites    Diabetes (HCC)    Elevated transaminase level    Fatigue    Fatty liver    Hyperlipidemia    Hypertension    "under control", not on any blood pressure medication, has lost 60 lbs   Hypothyroidism    on synthroid   Inguinal muscle strain    Insomnia    Left knee pain    "long time ago"   Multinodular goiter    Obesity    Pneumonia    Psoriasis    Tinnitus     Patient Active Problem List   Diagnosis Date Noted   Insomnia due to medical condition 11/28/2020   Snoring 11/28/2020   Complex partial seizures with impaired consciousness at onset (HCC) 10/27/2020   Cognitive complaints 10/27/2020   Daytime somnolence 10/27/2020   Hypothyroidism 10/27/2020   Uncontrolled type 2 diabetes mellitus with hyperglycemia (HCC) 10/27/2020   Chronic post concussive encephalopathy 10/27/2020   Abdominal distention    Diarrhea    Rectal bleeding 03/08/2019   Abnormal liver function 03/08/2019   Portal vein thrombosis 03/07/2019   Ascites of liver 03/07/2019   Abdominal pain 03/07/2019   Unspecified essential hypertension 04/29/2014   Other and unspecified hyperlipidemia 04/29/2014    Past Surgical History:  Procedure Laterality Date   APPENDECTOMY  1976   FOOT SURGERY Bilateral 1975   NOSE SURGERY     ROTATOR CUFF REPAIR Left    left   SHOULDER SURGERY Right 12/20/2019   [facility]     Current Outpatient Medications  Medication Sig Dispense Refill   azelastine (ASTELIN) 0.1 % nasal spray Place 1 spray into both nostrils 2 (two) times daily. Use in each nostril as directed     busPIRone (BUSPAR) 15 MG tablet Take 30 mg by mouth  2 (two) times daily.     Cholecalciferol (VITAMIN D3) 1000 UNITS CAPS Take 1 capsule by mouth daily.      colesevelam (WELCHOL) 625 MG tablet Take 1,875 mg by mouth daily.     escitalopram (LEXAPRO) 10 MG tablet Take 10 mg by mouth daily.      ezetimibe (ZETIA) 10 MG tablet Take 10 mg by mouth daily.     fluticasone (FLONASE) 50 MCG/ACT nasal spray Place 1 spray into both nostrils daily as needed for allergies.      furosemide (LASIX) 40 MG tablet Take 1 tablet (40 mg total) by mouth daily. 30 tablet 5   ipratropium (ATROVENT) 0.06 % nasal spray Place 1 spray into the nose daily.     Ixekizumab (TALTZ [place]) Inject into the skin every 14 (fourteen) days.     Krill Oil 500 MG CAPS Take 500 mg by mouth daily.     levocetirizine (XYZAL) 5 MG tablet Take 5 mg by mouth every evening.     levothyroxine (SYNTHROID) 50 MCG tablet Take 50 mcg by mouth daily.     metFORMIN (GLUCOPHAGE) 500 MG tablet Take 1,000 mg by mouth every evening.     mupirocin cream (BACTROBAN) 2 % Apply 1 application topically 2 (two) times daily. Use BID , 1 week PRN for nasal crusting or scabbing 15 g 0   Omeprazole Magnesium (PRILOSEC OTC PO) Take 1 tablet by mouth daily.     ondansetron (ZOFRAN ODT) 4 MG disintegrating tablet Take 1 tablet (4 mg total) by mouth every 8 (eight) hours as needed for nausea or vomiting. 6 tablet 0   sildenafil (REVATIO) 20 MG tablet Take 1 tablet by mouth as needed.     spironolactone (ALDACTONE) 100 MG tablet Take 1 tablet (100 mg total) by mouth daily. 30 tablet 5   thiamine 100 MG tablet Take 1 tablet (100 mg total) by mouth daily. 90 tablet 0   vitamin E 1000 UNIT capsule Take 1,000 Units by mouth daily.     zolpidem (AMBIEN) 5 MG tablet Take 5 mg by mouth at bedtime as needed for sleep.     levETIRAcetam (KEPPRA XR) 500 MG 24 hr tablet Take 2 tablets (1,000 mg total) by mouth daily. 180 tablet 3   No current facility-administered medications for this visit.    Allergies as of 01/28/2021 - Review Complete 11/26/2020  Allergen Reaction Noted   Crestor [rosuvastatin calcium]  03/15/2019   Mobic [meloxicam] Other (See Comments) 03/08/2019   Penicillins Rash 01/24/2013    Vitals: BP 109/64 (BP Location: Right Arm, Patient  Position: Sitting)   Pulse 69   Ht 5' 7.5" (1.715 m)   Wt 189 lb (85.7 kg)   BMI 29.16 kg/m  Last Weight:  Wt Readings from Last 1 Encounters:  01/28/21 189 lb (85.7 kg)   Last Height:   Ht Readings from Last 1 Encounters:  01/28/21 5' 7.5" (1.715 m)   Exam: NAD, pleasant                  Speech:    Speech is normal; fluent and spontaneous with normal comprehension.  Cognition:    The patient is oriented to person, place, and time;      language fluent;   MMSE - Mini Mental State Exam 09/04/2020  Orientation to time 4  Orientation to Place 5  Registration 3  Attention/ Calculation 5  Recall 3  Language- name 2 objects 2  Language- repeat 1  Language-  follow 3 step command 3  Language- read & follow direction 1  Write a sentence 1  Copy design 1  Total score 29      Cranial Nerves:    The pupils are equal, round, and reactive to light.Trigeminal sensation is intact and the muscles of mastication are normal. The face is symmetric. The palate elevates in the midline. Hearing intact. Voice is normal. Shoulder shrug is normal. The tongue has normal motion without fasciculations.   Coordination:  No dysmetria  Motor Observation:    No asymmetry, no atrophy, and no involuntary movements noted. Tone:    Normal muscle tone.     Strength:    Strength is V/V in the upper and lower limbs.      Sensation: intact to LT     Assessment/Plan:  60 year old with witnessed, sterotyped episodes of alteration of awareness and confusion after TBI and concussion.PMHx prior concussions.  Sounds like  "complex partial" seizures now called focal impaired awareness.   - we have been working on a lot of his medical and neurologic problems and he is feeling much better and is in great spirits - Sleep study was negative - He has lost much weight and is getting is hgba1c down - He has significantly cut back on etoh due to liver problems, doing well - Bloodwork last appointment showed  elevated TSH and he being managed by endocrinology - 72 hour eeg was normal and he is not having anymore episodes on keppra ER - will refer for neurocognitive testing due to multiple concussions (his next concern is his short-term memory difficulties). - He can resume driving short distances during the day - MRI of the brain w/wo contrast seizure protocol was unremarkable normal for age.     Orders Placed This Encounter  Procedures   Ambulatory referral to Neuropsychology    Meds ordered this encounter  Medications   levETIRAcetam (KEPPRA XR) 500 MG 24 hr tablet    Sig: Take 2 tablets (1,000 mg total) by mouth daily.    Dispense:  180 tablet    Refill:  3     Cc: [name], Walter, MD,  [name], Walter, MD  Naomie DeanAntonia Elysa Womac, MD  [facility] [address] [place], [place] [id number]  Phone [phone] Fax [phone]  I spent over 40  minutes of face-to-face and non-face-to-face time with patient on the  1. Short-term memory loss   2. H/O multiple concussions   3. Complex partial seizures with impaired consciousness at onset (HCC)   4. Hypothyroidism, unspecified type   5. Uncontrolled type 2 diabetes mellitus with hyperglycemia (HCC)   6. Chronic post concussive encephalopathy   7. Alteration of awareness     diagnosis.  This included previsit chart review, lab review, study review, order entry, electronic health record documentation, patient education on the different diagnostic and therapeutic options, counseling and coordination of care, risks and benefits of management, compliance, or risk factor reduction

## 2021-01-29 MED ORDER — LEVETIRACETAM ER 500 MG PO TB24: 1000.0000 mg | ORAL_TABLET | Freq: Every day | ORAL | 3 refills | Status: DC

## 2021-02-03 DIAGNOSIS — M25512 Pain in left shoulder: Secondary | ICD-10-CM | POA: Diagnosis not present

## 2021-03-19 ENCOUNTER — Telehealth: Payer: Self-pay | Admitting: Neurology

## 2021-03-19 NOTE — Telephone Encounter (Signed)
Pt's wife called stating her husband is supposed to be going to a wood working event in [place], wife states pt wants to go alone but wife doesn't think it is a good idea. Wife says he is much better, but she does not think he is ready considering he will be around a lot of machinery and lots of people. Wife is requesting a call back.

## 2021-03-23 NOTE — Telephone Encounter (Signed)
FYI wife called back, message was relayed to her.  She will call Dr Marvetta Gibbons office to go about scheduling.

## 2021-03-23 NOTE — Telephone Encounter (Signed)
I called [name] (on DPR) and LVM with office number asking for call back. When she calls back, its ok to give her this message from Dr Lucia Gaskins: Dr Lucia Gaskins really can't advise, they (pt and wife) have to make that call together. Dr Lucia Gaskins has not seen him in a few months either. She sent in a referral for formal neurocognitive testing with Dr Kieth Brightly. We don't see he has made that appointment. In fact, I see Dr Marvetta Gibbons office called them 2 times to try to schedule an appointment. Unfortunately, Dr Lucia Gaskins cannot advise. If they will schedule with Dr Marvetta Gibbons office, the number is [phone].

## 2021-03-23 NOTE — Telephone Encounter (Signed)
Thank you so much for the update.

## 2021-03-31 ENCOUNTER — Other Ambulatory Visit: Payer: Self-pay | Admitting: Gastroenterology

## 2021-03-31 DIAGNOSIS — K766 Portal hypertension: Secondary | ICD-10-CM

## 2021-03-31 DIAGNOSIS — K3189 Other diseases of stomach and duodenum: Secondary | ICD-10-CM

## 2021-03-31 DIAGNOSIS — K2970 Gastritis, unspecified, without bleeding: Secondary | ICD-10-CM

## 2021-03-31 DIAGNOSIS — R748 Abnormal levels of other serum enzymes: Secondary | ICD-10-CM

## 2021-03-31 DIAGNOSIS — K7460 Unspecified cirrhosis of liver: Secondary | ICD-10-CM

## 2021-03-31 DIAGNOSIS — R188 Other ascites: Secondary | ICD-10-CM

## 2021-04-20 DIAGNOSIS — E1169 Type 2 diabetes mellitus with other specified complication: Secondary | ICD-10-CM | POA: Diagnosis not present

## 2021-04-20 DIAGNOSIS — F419 Anxiety disorder, unspecified: Secondary | ICD-10-CM | POA: Diagnosis not present

## 2021-04-22 DIAGNOSIS — Z0271 Encounter for disability determination: Secondary | ICD-10-CM

## 2021-06-10 DIAGNOSIS — Z0271 Encounter for disability determination: Secondary | ICD-10-CM

## 2021-07-02 ENCOUNTER — Inpatient Hospital Stay (HOSPITAL_COMMUNITY)
Admission: EM | Admit: 2021-07-02 | Discharge: 2021-07-06 | DRG: 433 | Disposition: A | Discharge status: Home / Self Care | Payer: BC Managed Care – PPO | Attending: Internal Medicine | Admitting: Internal Medicine

## 2021-07-02 ENCOUNTER — Other Ambulatory Visit: Payer: Self-pay

## 2021-07-02 ENCOUNTER — Encounter (HOSPITAL_COMMUNITY): Payer: Self-pay

## 2021-07-02 ENCOUNTER — Emergency Department (HOSPITAL_COMMUNITY): Payer: BC Managed Care – PPO

## 2021-07-02 DIAGNOSIS — E119 Type 2 diabetes mellitus without complications: Secondary | ICD-10-CM | POA: Diagnosis present

## 2021-07-02 DIAGNOSIS — Z8616 Personal history of COVID-19: Secondary | ICD-10-CM | POA: Diagnosis not present

## 2021-07-02 DIAGNOSIS — E039 Hypothyroidism, unspecified: Secondary | ICD-10-CM | POA: Diagnosis present

## 2021-07-02 DIAGNOSIS — D638 Anemia in other chronic diseases classified elsewhere: Secondary | ICD-10-CM | POA: Diagnosis not present

## 2021-07-02 DIAGNOSIS — R748 Abnormal levels of other serum enzymes: Secondary | ICD-10-CM

## 2021-07-02 DIAGNOSIS — K7290 Hepatic failure, unspecified without coma: Secondary | ICD-10-CM | POA: Diagnosis not present

## 2021-07-02 DIAGNOSIS — R188 Other ascites: Secondary | ICD-10-CM | POA: Diagnosis not present

## 2021-07-02 DIAGNOSIS — R1110 Vomiting, unspecified: Secondary | ICD-10-CM | POA: Diagnosis not present

## 2021-07-02 DIAGNOSIS — F1010 Alcohol abuse, uncomplicated: Secondary | ICD-10-CM | POA: Diagnosis present

## 2021-07-02 DIAGNOSIS — I10 Essential (primary) hypertension: Secondary | ICD-10-CM | POA: Diagnosis not present

## 2021-07-02 DIAGNOSIS — Z87898 Personal history of other specified conditions: Secondary | ICD-10-CM

## 2021-07-02 DIAGNOSIS — Z87891 Personal history of nicotine dependence: Secondary | ICD-10-CM

## 2021-07-02 DIAGNOSIS — K7460 Unspecified cirrhosis of liver: Secondary | ICD-10-CM

## 2021-07-02 DIAGNOSIS — F419 Anxiety disorder, unspecified: Secondary | ICD-10-CM | POA: Diagnosis not present

## 2021-07-02 DIAGNOSIS — K7031 Alcoholic cirrhosis of liver with ascites: Principal | ICD-10-CM | POA: Diagnosis present

## 2021-07-02 DIAGNOSIS — K7030 Alcoholic cirrhosis of liver without ascites: Secondary | ICD-10-CM | POA: Diagnosis not present

## 2021-07-02 DIAGNOSIS — E876 Hypokalemia: Secondary | ICD-10-CM

## 2021-07-02 DIAGNOSIS — G40909 Epilepsy, unspecified, not intractable, without status epilepticus: Secondary | ICD-10-CM | POA: Diagnosis present

## 2021-07-02 DIAGNOSIS — E871 Hypo-osmolality and hyponatremia: Secondary | ICD-10-CM | POA: Diagnosis not present

## 2021-07-02 DIAGNOSIS — F418 Other specified anxiety disorders: Secondary | ICD-10-CM

## 2021-07-02 DIAGNOSIS — Z820 Family history of epilepsy and other diseases of the nervous system: Secondary | ICD-10-CM

## 2021-07-02 DIAGNOSIS — Z8052 Family history of malignant neoplasm of bladder: Secondary | ICD-10-CM | POA: Diagnosis not present

## 2021-07-02 DIAGNOSIS — R17 Unspecified jaundice: Secondary | ICD-10-CM | POA: Diagnosis not present

## 2021-07-02 DIAGNOSIS — K3189 Other diseases of stomach and duodenum: Secondary | ICD-10-CM

## 2021-07-02 DIAGNOSIS — Z7984 Long term (current) use of oral hypoglycemic drugs: Secondary | ICD-10-CM

## 2021-07-02 DIAGNOSIS — Z79899 Other long term (current) drug therapy: Secondary | ICD-10-CM

## 2021-07-02 DIAGNOSIS — Z79890 Hormone replacement therapy (postmenopausal): Secondary | ICD-10-CM

## 2021-07-02 DIAGNOSIS — R410 Disorientation, unspecified: Secondary | ICD-10-CM | POA: Diagnosis not present

## 2021-07-02 DIAGNOSIS — R059 Cough, unspecified: Secondary | ICD-10-CM

## 2021-07-02 DIAGNOSIS — R791 Abnormal coagulation profile: Secondary | ICD-10-CM | POA: Diagnosis present

## 2021-07-02 DIAGNOSIS — E785 Hyperlipidemia, unspecified: Secondary | ICD-10-CM | POA: Diagnosis present

## 2021-07-02 DIAGNOSIS — F32A Depression, unspecified: Secondary | ICD-10-CM | POA: Diagnosis not present

## 2021-07-02 DIAGNOSIS — K2970 Gastritis, unspecified, without bleeding: Secondary | ICD-10-CM

## 2021-07-02 DIAGNOSIS — R110 Nausea: Secondary | ICD-10-CM | POA: Diagnosis not present

## 2021-07-02 DIAGNOSIS — K760 Fatty (change of) liver, not elsewhere classified: Secondary | ICD-10-CM | POA: Diagnosis present

## 2021-07-02 DIAGNOSIS — R933 Abnormal findings on diagnostic imaging of other parts of digestive tract: Secondary | ICD-10-CM | POA: Diagnosis not present

## 2021-07-02 DIAGNOSIS — K766 Portal hypertension: Secondary | ICD-10-CM | POA: Diagnosis present

## 2021-07-02 DIAGNOSIS — R1084 Generalized abdominal pain: Secondary | ICD-10-CM | POA: Diagnosis not present

## 2021-07-02 DIAGNOSIS — R112 Nausea with vomiting, unspecified: Secondary | ICD-10-CM | POA: Diagnosis present

## 2021-07-02 LAB — URINALYSIS, ROUTINE W REFLEX MICROSCOPIC
Glucose, UA: NEGATIVE mg/dL
Hgb urine dipstick: NEGATIVE
Ketones, ur: 5 mg/dL — AB
Leukocytes,Ua: NEGATIVE
Nitrite: NEGATIVE
Protein, ur: NEGATIVE mg/dL
Specific Gravity, Urine: >1.046 — ABNORMAL HIGH (ref 1.005–1.030)
pH: 6 (ref 5.0–8.0)

## 2021-07-02 LAB — RESP PANEL BY RT-PCR (FLU A&B, COVID) ARPGX2
Influenza A by PCR: NEGATIVE
Influenza B by PCR: NEGATIVE
SARS Coronavirus 2 by RT PCR: NEGATIVE

## 2021-07-02 LAB — CBC WITH DIFFERENTIAL/PLATELET
Abs Immature Granulocytes: 0.03 10*3/uL (ref 0.00–0.07)
Basophils Absolute: 0.1 10*3/uL (ref 0.0–0.1)
Basophils Relative: 1 %
Eosinophils Absolute: 0.2 10*3/uL (ref 0.0–0.5)
Eosinophils Relative: 2 %
HCT: 32.3 % — ABNORMAL LOW (ref 39.0–52.0)
Hemoglobin: 12.1 g/dL — ABNORMAL LOW (ref 13.0–17.0)
Immature Granulocytes: 0 %
Lymphocytes Relative: 18 %
Lymphs Abs: 1.4 10*3/uL (ref 0.7–4.0)
MCH: 35.5 pg — ABNORMAL HIGH (ref 26.0–34.0)
MCHC: 37.5 g/dL — ABNORMAL HIGH (ref 30.0–36.0)
MCV: 94.7 fL (ref 80.0–100.0)
Monocytes Absolute: 1 10*3/uL (ref 0.1–1.0)
Monocytes Relative: 13 %
Neutro Abs: 4.9 10*3/uL (ref 1.7–7.7)
Neutrophils Relative %: 66 %
Platelets: 135 10*3/uL — ABNORMAL LOW (ref 150–400)
RBC: 3.41 MIL/uL — ABNORMAL LOW (ref 4.22–5.81)
RDW: 17.2 % — ABNORMAL HIGH (ref 11.5–15.5)
WBC: 7.6 10*3/uL (ref 4.0–10.5)
nRBC: 0 % (ref 0.0–0.2)

## 2021-07-02 LAB — COMPREHENSIVE METABOLIC PANEL
ALT: 48 U/L — ABNORMAL HIGH (ref 0–44)
AST: 137 U/L — ABNORMAL HIGH (ref 15–41)
Albumin: 3.5 g/dL (ref 3.5–5.0)
Alkaline Phosphatase: 130 U/L — ABNORMAL HIGH (ref 38–126)
Anion gap: 14 (ref 5–15)
BUN: 9 mg/dL (ref 6–20)
CO2: 34 mmol/L — ABNORMAL HIGH (ref 22–32)
Calcium: 7.8 mg/dL — ABNORMAL LOW (ref 8.9–10.3)
Chloride: 81 mmol/L — ABNORMAL LOW (ref 98–111)
Creatinine, Ser: 1.15 mg/dL (ref 0.61–1.24)
GFR, Estimated: >60 mL/min (ref >60)
Glucose, Bld: 115 mg/dL — ABNORMAL HIGH (ref 70–99)
Potassium: 2.5 mmol/L — CL (ref 3.5–5.1)
Sodium: 129 mmol/L — ABNORMAL LOW (ref 135–145)
Total Bilirubin: 16.1 mg/dL — ABNORMAL HIGH (ref 0.3–1.2)
Total Protein: 7.1 g/dL (ref 6.5–8.1)

## 2021-07-02 LAB — PROTIME-INR
INR: 1.7 — ABNORMAL HIGH (ref 0.8–1.2)
Prothrombin Time: 20.2 seconds — ABNORMAL HIGH (ref 11.4–15.2)

## 2021-07-02 LAB — LIPASE, BLOOD: Lipase: 45 U/L (ref 11–51)

## 2021-07-02 MED ORDER — SODIUM CHLORIDE 0.9 % IV SOLN: INTRAVENOUS | Status: DC

## 2021-07-02 MED ORDER — ONDANSETRON HCL 4 MG/2ML IJ SOLN
4.0000 mg | Freq: Once | INTRAMUSCULAR | Status: AC
  Administered 2021-07-02: 4 mg via INTRAVENOUS
  Filled 2021-07-02: qty 2

## 2021-07-02 MED ORDER — MORPHINE SULFATE (PF) 4 MG/ML IV SOLN
4.0000 mg | Freq: Once | INTRAVENOUS | Status: AC
  Administered 2021-07-02: 4 mg via INTRAVENOUS
  Filled 2021-07-02: qty 1

## 2021-07-02 MED ORDER — EZETIMIBE 10 MG PO TABS
10.0000 mg | ORAL_TABLET | Freq: Every day | ORAL | Status: DC
  Administered 2021-07-03 – 2021-07-06 (×4): 10 mg via ORAL
  Filled 2021-07-02 (×4): qty 1

## 2021-07-02 MED ORDER — BUSPIRONE HCL 10 MG PO TABS: 22.5000 mg | ORAL_TABLET | Freq: Every day | ORAL | Status: DC

## 2021-07-02 MED ORDER — ENOXAPARIN SODIUM 40 MG/0.4ML IJ SOSY
40.0000 mg | PREFILLED_SYRINGE | INTRAMUSCULAR | Status: DC
  Filled 2021-07-02 (×3): qty 0.4

## 2021-07-02 MED ORDER — IOHEXOL 350 MG/ML SOLN
80.0000 mL | Freq: Once | INTRAVENOUS | Status: AC | PRN
  Administered 2021-07-02: 80 mL via INTRAVENOUS

## 2021-07-02 MED ORDER — ONDANSETRON HCL 4 MG/2ML IJ SOLN: 4.0000 mg | Freq: Four times a day (QID) | INTRAMUSCULAR | Status: DC | PRN

## 2021-07-02 MED ORDER — ZOLPIDEM TARTRATE 5 MG PO TABS
5.0000 mg | ORAL_TABLET | Freq: Every day | ORAL | Status: DC
  Administered 2021-07-03 – 2021-07-05 (×4): 5 mg via ORAL
  Filled 2021-07-02 (×4): qty 1

## 2021-07-02 MED ORDER — LEVOTHYROXINE SODIUM 75 MCG PO TABS
75.0000 ug | ORAL_TABLET | Freq: Every day | ORAL | Status: DC
  Administered 2021-07-03 – 2021-07-06 (×4): 75 ug via ORAL
  Filled 2021-07-02 (×4): qty 1

## 2021-07-02 MED ORDER — ESCITALOPRAM OXALATE 10 MG PO TABS: 5.0000 mg | ORAL_TABLET | Freq: Every day | ORAL | Status: DC

## 2021-07-02 MED ORDER — POTASSIUM CHLORIDE 10 MEQ/100ML IV SOLN
10.0000 meq | INTRAVENOUS | Status: AC
  Administered 2021-07-02 (×4): 10 meq via INTRAVENOUS
  Filled 2021-07-02 (×4): qty 100

## 2021-07-02 MED ORDER — COLESEVELAM HCL 625 MG PO TABS
625.0000 mg | ORAL_TABLET | Freq: Every day | ORAL | Status: DC
  Administered 2021-07-03 – 2021-07-06 (×4): 625 mg via ORAL
  Filled 2021-07-02 (×5): qty 1

## 2021-07-02 MED ORDER — LEVETIRACETAM ER 500 MG PO TB24: 1000.0000 mg | ORAL_TABLET | Freq: Every day | ORAL | Status: DC

## 2021-07-02 MED ORDER — SODIUM CHLORIDE 0.9 % IV BOLUS
1000.0000 mL | Freq: Once | INTRAVENOUS | Status: AC
  Administered 2021-07-02: 1000 mL via INTRAVENOUS

## 2021-07-02 MED ORDER — BUSPIRONE HCL 5 MG PO TABS
15.0000 mg | ORAL_TABLET | Freq: Every day | ORAL | Status: DC
  Administered 2021-07-03 – 2021-07-06 (×4): 15 mg via ORAL
  Filled 2021-07-02 (×3): qty 1
  Filled 2021-07-02: qty 2

## 2021-07-02 NOTE — ED Triage Notes (Signed)
Pt c/o abdominal pain, nausea, vomiting, and diarrhea x 2 weeks.

## 2021-07-02 NOTE — H&P (Addendum)
History and Physical    Andrew Patton [id number] DOB: Apr 13, 1961 DOA: 07/02/2021  PCP: Deland Pretty, MD  Patient coming from: Home  I have personally briefly reviewed patient's old medical records in [place]  Chief Complaint: Nausea, vomiting, abdominal pain /distention  HPI: Andrew Patton is a 60 y.o. male with medical history significant for psoriatic arthritis, cirrhosis suspected secondary to alcohol abuse, hypertension and hyperlipidemia who presents with concerns of nausea, vomiting abdominal pain and distention.  About 2 weeks ago patient was still going out to eat at restaurants but then had a day where he suddenly had hours of dry heaving causing abdominal pain.  Since then has been dealing with nausea, vomiting and not being able to tolerate much food. Has looser stools and is not on lactulose since he already has good bowel movements on his own.  Feels hot when he is vomiting but doubts he had a fever.  Tried to bear with it over the Thanksgiving holiday prior to seeking for help today.    Denies any new medication.  Only change has been discontinuation of his metformin by PCP.  He also discontinued Lasix and spironolactone on his own after he was becoming more dehydrated.  He continues to have alcohol use but states is very minimal.  He drinks less than 1 can of beer on each occasion.  Has been working hard with his wife to quit.Occasionally has "edible" cookies given by a friend.  He has been noting more tightening distention of his abdomen.  Last paracentesis was somewhere around 3 to 5 years ago.  No other edema to the extremities. Has new yellowing of the skin.   Last travel to [place] in May and returned home with COVID.  ED Course: He was afebrile, normotensive fabellae tachycardic with heart rate of 103 on room air.  Sodium of 129, potassium of 2.5, chloride of 81, CO2 of 34, creatinine of 1.15, normal anion gap. AST elevated to 137, ALT of 48, alkaline  phos of 130 around his baseline, total bilirubin at 16.1 from his usual baseline of around 3.  INR elevated 1.7.  Flu and influenza PCR negative.  CT abdomen pelvis with contrast showed distended gallbladder without calcified stones.  No biliary dilatation.  Shows liver cirrhosis with evidence of portal hypertension.  Small volume ascites.  Negative for bowel obstructions.  Review of Systems: Constitutional: No Weight Change, No Fever ENT/Mouth: No sore throat, No Rhinorrhea Eyes: No Vision Changes Cardiovascular: No Chest Pain, no SOB,No Edema Respiratory: No Cough Gastrointestinal: + Nausea, + Vomiting, No Diarrhea, No Constipation,+ Pain Genitourinary: no Urinary Incontinence, No Urgency, No Flank Pain Musculoskeletal: No Arthralgias, No Myalgias Skin: No Skin Lesions, No Pruritus, Neuro: no Weakness, No Numbness Psych: No Anxiety/Panic, No Depression, + decrease appetite Heme/Lymph: No Bruising, No Bleeding   Past Medical History:  Diagnosis Date   Allergy    Anxiety    Arthritis    Ascites    Diabetes (HCC)    Elevated transaminase level    Fatigue    Fatty liver    Hyperlipidemia    Hypertension    "under control", not on any blood pressure medication, has lost 60 lbs   Hypothyroidism    on synthroid   Inguinal muscle strain    Insomnia    Left knee pain    "long time ago"   Multinodular goiter    Obesity    Pneumonia    Psoriasis    Tinnitus  Past Surgical History:  Procedure Laterality Date   APPENDECTOMY  1976   FOOT SURGERY Bilateral 1975   NOSE SURGERY     ROTATOR CUFF REPAIR Left    left   SHOULDER SURGERY Right 12/20/2019   [facility]      reports that he quit smoking about 27 years ago. His smoking use included cigarettes. He has quit using smokeless tobacco.  His smokeless tobacco use included chew. He reports current alcohol use of about 2.0 standard drinks per week. He reports that he does not use drugs. Social  History  Allergies  Allergen Reactions   Crestor [Rosuvastatin Calcium]     Elevated liver enzymes   Meloxicam Other (See Comments)    Leg pain Other reaction(s): Leg Pain   Rosuvastatin     Other reaction(s): Liver   Penicillins Rash    Has patient had a PCN reaction causing immediate rash, facial/tongue/throat swelling, SOB or lightheadedness with hypotension: No Has patient had a PCN reaction causing severe rash involving mucus membranes or skin necrosis: No Has patient had a PCN reaction that required hospitalization: No Has patient had a PCN reaction occurring within the last 10 years: No If all of the above answers are "NO", then may proceed with Cephalosporin use. Other reaction(s): rash    Family History  Problem Relation Age of Onset   Breast cancer Mother    Hyperlipidemia Mother    Other Mother        ? form of parkinson's    Parkinsonism Mother    Bladder Cancer Father    Parkinson's disease Father    Memory loss Maternal Grandmother        in her 102s   Birth defects Maternal Grandfather    Birth defects Paternal Grandmother    Colon cancer Neg Hx    Esophageal cancer Neg Hx    Rectal cancer Neg Hx    Stomach cancer Neg Hx    Colon polyps Neg Hx    Dementia Neg Hx    Alzheimer's disease Neg Hx      Prior to Admission medications   Medication Sig Start Date End Date Taking? Authorizing Provider  azelastine (ASTELIN) 0.1 % nasal spray Place 1 spray into both nostrils 2 (two) times daily. Use in each nostril as directed    [provider]  busPIRone (BUSPAR) 15 MG tablet Take 30 mg by mouth 2 (two) times daily.    [provider]  Cholecalciferol (VITAMIN D3) 1000 UNITS CAPS Take 1 capsule by mouth daily.     [provider]  colesevelam (WELCHOL) 625 MG tablet Take 1,875 mg by mouth daily.    [provider]  escitalopram (LEXAPRO) 10 MG tablet Take 10 mg by mouth daily. 01/27/21   [provider]  ezetimibe  (ZETIA) 10 MG tablet Take 10 mg by mouth daily.    [provider]  fluticasone (FLONASE) 50 MCG/ACT nasal spray Place 1 spray into both nostrils daily as needed for allergies.  01/05/13   [provider]  furosemide (LASIX) 40 MG tablet Take 1 tablet (40 mg total) by mouth daily. 03/31/21   Cirigliano, Vito V, DO  ipratropium (ATROVENT) 0.06 % nasal spray Place 1 spray into the nose daily. 02/17/19   [provider]  Ixekizumab (TALTZ [place]) Inject into the skin every 14 (fourteen) days.    [provider]  Javier Docker Oil 500 MG CAPS Take 500 mg by mouth daily.    [provider]  levETIRAcetam (KEPPRA XR) 500 MG 24 hr tablet Take 2 tablets (1,000 mg total) by mouth daily. 01/29/21   Anson Fret, MD  levocetirizine (XYZAL) 5 MG tablet Take 5 mg by mouth every evening.    [provider]  levothyroxine (SYNTHROID) 50 MCG tablet Take 50 mcg by mouth daily. 08/12/20   [provider]  metFORMIN (GLUCOPHAGE) 500 MG tablet Take 1,000 mg by mouth every evening.    [provider]  mupirocin cream (BACTROBAN) 2 % Apply 1 application topically 2 (two) times daily. Use BID , 1 week PRN for nasal crusting or scabbing 06/17/20   Drema Halon, MD  Omeprazole Magnesium (PRILOSEC OTC PO) Take 1 tablet by mouth daily.    [provider]  ondansetron (ZOFRAN ODT) 4 MG disintegrating tablet Take 1 tablet (4 mg total) by mouth every 8 (eight) hours as needed for nausea or vomiting. 02/08/20   Graciella Freer A, PA-C  sildenafil (REVATIO) 20 MG tablet Take 1 tablet by mouth as needed. 02/26/19   [provider]  spironolactone (ALDACTONE) 100 MG tablet Take 1 tablet (100 mg total) by mouth daily. 10/14/20   Cirigliano, Vito V, DO  thiamine 100 MG tablet Take 1 tablet (100 mg total) by mouth daily. 03/10/19   Almon Hercules, MD  vitamin E 1000 UNIT capsule Take 1,000 Units by mouth daily.    [provider]  zolpidem (AMBIEN) 5  MG tablet Take 5 mg by mouth at bedtime as needed for sleep.    [provider]    Physical Exam: Vitals:   07/02/21 1445 07/02/21 1840  BP: 125/64 120/64  Pulse: (!) 103 90  Resp: 16 18  Temp: 98.2 F (36.8 C)   TempSrc: Oral   SpO2: 96% 96%  Weight:  85.7 kg  Height:  5' 7.5" (1.715 m)    Constitutional: NAD, calm, comfortable, nontoxic appearing elderly male sitting upright in bed with notable facial jaundice and scleral icterus Vitals:   07/02/21 1445 07/02/21 1840  BP: 125/64 120/64  Pulse: (!) 103 90  Resp: 16 18  Temp: 98.2 F (36.8 C)   TempSrc: Oral   SpO2: 96% 96%  Weight:  85.7 kg  Height:  5' 7.5" (1.715 m)   Eyes: Scleral icterus bilaterally ENMT: Mucous membranes are moist.  Neck: normal, supple Respiratory: clear to auscultation bilaterally, no wheezing, no crackles. Normal respiratory effort. No accessory muscle use.  Cardiovascular: Regular rate and rhythm, no murmurs / rubs / gallops. No extremity edema.  Abdomen: Distended abdomen with abdominal wall tightness.  Recti diastasis. musculoskeletal: no clubbing / cyanosis. No joint deformity upper and lower extremities. Good ROM, no contractures. Normal muscle tone.  Skin: no rashes, lesions, ulcers. No induration Neurologic: CN 2-12 grossly intact. Sensation intact, Strength 5/5 in all 4.  Psychiatric: Normal judgment and insight. Alert and oriented x 3. Normal mood.     Labs on Admission: I have personally reviewed following labs and imaging studies  CBC: Recent Labs  Lab 07/02/21 1539  WBC 7.6  NEUTROABS 4.9  HGB 12.1*  HCT 32.3*  MCV 94.7  PLT 135*   Basic Metabolic Panel: Recent Labs  Lab 07/02/21 1539  NA 129*  K 2.5*  CL 81*  CO2 34*  GLUCOSE 115*  BUN 9  CREATININE 1.15  CALCIUM 7.8*   GFR: Estimated Creatinine Clearance: 72.2 mL/min (by C-G formula based on SCr of 1.15 mg/dL). Liver Function Tests: Recent Labs  Lab 07/02/21 1539  AST 137*  ALT 48*  ALKPHOS  130*  BILITOT 16.1*  PROT 7.1  ALBUMIN 3.5   Recent Labs  Lab 07/02/21 1539  LIPASE 45   No results for input(s): AMMONIA in the last 168 hours. Coagulation Profile: Recent Labs  Lab 07/02/21 1702  INR 1.7*   Cardiac Enzymes: No results for input(s): CKTOTAL, CKMB, CKMBINDEX, TROPONINI in the last 168 hours. BNP (last 3 results) No results for input(s): PROBNP in the last 8760 hours. HbA1C: No results for input(s): HGBA1C in the last 72 hours. CBG: No results for input(s): GLUCAP in the last 168 hours. Lipid Profile: No results for input(s): CHOL, HDL, LDLCALC, TRIG, CHOLHDL, LDLDIRECT in the last 72 hours. Thyroid Function Tests: No results for input(s): TSH, T4TOTAL, FREET4, T3FREE, THYROIDAB in the last 72 hours. Anemia Panel: No results for input(s): VITAMINB12, FOLATE, FERRITIN, TIBC, IRON, RETICCTPCT in the last 72 hours. Urine analysis:    Component Value Date/Time   COLORURINE ORANGE (A) 03/07/2019 1630   APPEARANCEUR Clear 11/13/2020 1333   LABSPEC 1.020 03/07/2019 1630   PHURINE 5.5 03/07/2019 1630   GLUCOSEU Negative 11/13/2020 1333   HGBUR NEGATIVE 03/07/2019 1630   BILIRUBINUR Negative 11/13/2020 1333   KETONESUR 15 (A) 03/07/2019 1630   PROTEINUR Negative 11/13/2020 1333   PROTEINUR 30 (A) 03/07/2019 1630   NITRITE Negative 11/13/2020 1333   NITRITE POSITIVE (A) 03/07/2019 1630   LEUKOCYTESUR Negative 11/13/2020 1333   LEUKOCYTESUR NEGATIVE 03/07/2019 1630    Radiological Exams on Admission: CT Abdomen Pelvis W Contrast  Result Date: 07/02/2021 CLINICAL DATA:  Epigastric pain with nausea vomiting and diarrhea EXAM: CT ABDOMEN AND PELVIS WITH CONTRAST TECHNIQUE: Multidetector CT imaging of the abdomen and pelvis was performed using the standard protocol following bolus administration of intravenous contrast. CONTRAST:  43mL OMNIPAQUE IOHEXOL 350 MG/ML SOLN COMPARISON:  CT 03/07/2019 FINDINGS: Lower chest: Lung bases demonstrate no acute consolidation  or pleural effusion. Borderline cardiomegaly. Hepatobiliary: Liver cirrhosis. Diffuse micro nodularity of the liver without dominant hepatic mass. Distended gallbladder without calcified stone. No biliary dilatation. Pancreas: Unremarkable. No pancreatic ductal dilatation or surrounding inflammatory changes. Spleen: Mildly enlarged. Adrenals/Urinary Tract: Adrenal glands are unremarkable. Kidneys are normal, without renal calculi, focal lesion, or hydronephrosis. Bladder is unremarkable. Stomach/Bowel: Stomach nonenlarged. No dilated small bowel. Prominent submucosal fat deposition at the colon with collapsed appearance. No definite colon inflammatory process. Negative appendix. Diverticular disease of left colon. Vascular/Lymphatic: Recanalized paraumbilical vein. Nonaneurysmal aorta with atherosclerosis. No suspicious nodes. Reproductive: Prostate is unremarkable. Other: Negative for free air. Small amount of ascites within the abdomen and pelvis. Small fat and fluid containing umbilical hernia Musculoskeletal: No acute or significant osseous findings. IMPRESSION: 1. Liver cirrhosis with evidence of portal hypertension. Small volume of abdominopelvic ascites. Diffuse micronodularity of the liver parenchyma without dominant mass, When the patient is clinically stable and able to follow directions and hold their breath (preferably as an outpatient) further evaluation with dedicated abdominal MRI should be considered. 2. Dilated gallbladder without calcified stone. Correlation with right upper quadrant ultrasound as indicated 3. Negative for bowel obstruction. Favor collapsed appearance and prominent submucosal fat deposition of the colon as opposed to colitis type changes. 4. Left colon diverticular disease without acute inflammation. Electronically Signed   By: Donavan Foil M.D.   On: 07/02/2021 17:57      Assessment/Plan  Hyperbilirubinemia Elevated liver enzyme Cirrhosis secondary to alcohol use-not on  transplant list due to ongoing ETOH use AST of 137, ALT  of 48, alkaline phosphatase of 130 -Total bilirubin at baseline around 3 now elevated to 16.1. No biliary obstruction seen on CT abdomen.  Possibly could be intrahepatic injury triggered by viral infection in the recent past. -Obtain fractionated conjugated/unconjugated bilirubin -GI consulted and will see tomorrow.  Follows with [facility] Dr. Bryan Lemma.   Abdominal pain secondary to distention and nausea/vomiting  -Only small volume abdominopelvic ascites seen on CT abdomen and pelvis.  Suspect he is having increased fluid retention in his abdominal wall following discontinuation of his furosemide and spironolactone.  However, will hold diuresis overnight since he is intravascularly depleted and notably dehydrated with his electrolyte derangements. -No fever and pain is usually following vomiting-low concern for SBP -GI panel and C.diff testing pending  Hypokalemia - Replete with IV 10 mEq potassium x4  Hyponatremia - Sodium of 129.  Repeat BMP following 1L of NS in ED. Continue gentle IV fluid hydration overnight. Check urine studies  Hypomagnesemia - Replete with IV magnesium  History of seizure - Continue Keppra  Hypothyroidism Continue levothyroxine  Type 2 diabetes - Recently taken off metformin by PCP  Depression/anxiety Continue BuSpar,  Hold Lexapro for now due to hyponatremia   DVT prophylaxis:.Lovenox Code Status: Full Family Communication: Plan discussed with patient at bedside  disposition Plan: Home with at least 2 midnight stays  Consults called:  Admission status: inpatient  Level of care: Med-Surg  Status is: Inpatient  Remains inpatient appropriate because: Cirrhosis with significantly elevated hyperbilirubinemia with symptoms concerning for worsening intrahepatocellular cellular injury.         Orene Desanctis DO [facility]   If 7PM-7AM, please contact  night-coverage [email or web address]   07/02/2021, 8:42 PM

## 2021-07-02 NOTE — ED Provider Notes (Signed)
Emergency Medicine Provider Triage Evaluation Note  Andrew Patton , a 60 y.o. male  was evaluated in triage.  Pt complains of abdominal pain, abdominal distention, nausea, vomiting, diarrhea x2 weeks.  Abdominal pain is diffuse, worse with movement. Also having hematuria.  Patient has liver cirrhosis, this is a ventral and umbilical hernia which is soft and easily reducible.  Is followed by [facility].  Called his physician who told him to the ED for additional work-up for intra-abdominal infectious process.  Status post appendectomy, he has had therapeutic paracentesis in the past.  He is not currently on liver transplant list.  He had COVID roughly 3 to 4 weeks ago, also had a sinus infection which he was treated with a Z-Pak about 3 to 5 weeks ago.  Review of Systems  Positive: Abdominal pain, abdominal distention, nausea, vomiting, diarrhea Negative: Fever  Physical Exam  BP 125/64 (BP Location: Left Arm)   Pulse (!) 103   Temp 98.2 F (36.8 C) (Oral)   Resp 16   SpO2 96%  Gen:   Awake, ill-appearing Resp:  Normal effort  MSK:   Moves extremities without difficulty  Other:  Scleral icterus.  Generalized abdominal tenderness, abdomen is distended.  Umbilical hernia soft and reducible.  No CVA tenderness.  No ascites.  Medical Decision Making  Medically screening exam initiated at 3:09 PM.  Appropriate orders placed.  Andrew Patton was informed that the remainder of the evaluation will be completed by another provider, this initial triage assessment does not replace that evaluation, and the importance of remaining in the ED until their evaluation is complete.  Abdominal labs, urine, CT abdomen.  Will also get stool cultures and check for C. difficile.   [name], PA-C 07/02/21 1514    [name], MD 07/02/21 1659

## 2021-07-02 NOTE — ED Provider Notes (Signed)
[facility] DEPT Provider Note   CSN: [id number] Arrival date & time: 07/02/21  1429     History Chief Complaint  Patient presents with   Abdominal Pain   Nausea   Vomiting    Andrew Patton is a 60 y.o. male.  He has a history of cirrhosis and follows with [facility].  He said he caught COVID in July and has been slow to recover.  Complaining of nausea and vomiting, loose stools for 2 weeks.  Has been vomiting so much that he has developed an umbilical hernia.  Complaining of 5 out of 10 generalized abdominal pain.  He thinks he seen some blood in his urine.  Saw his primary care doctor who recommended he come to the emergency department.  Today he noticed there was some more yellow in his eyes and skin.  Low-grade fevers.  Did travel to [place] this past summer.  States minimal alcohol intake.  The history is provided by the patient.  Abdominal Pain Pain location:  Epigastric Pain quality: aching   Pain radiates to:  Chest Pain severity:  Moderate Onset quality:  Gradual Duration:  2 weeks Timing:  Intermittent Progression:  Unchanged Chronicity:  New Context: not trauma   Relieved by:  None tried Worsened by:  Vomiting Ineffective treatments:  None tried Associated symptoms: diarrhea, fatigue, hematuria, nausea and vomiting   Associated symptoms: no chest pain, no constipation, no cough, no dysuria, no fever, no hematemesis, no hematochezia, no shortness of breath and no sore throat       Past Medical History:  Diagnosis Date   Allergy    Anxiety    Arthritis    Ascites    Diabetes (HCC)    Elevated transaminase level    Fatigue    Fatty liver    Hyperlipidemia    Hypertension    "under control", not on any blood pressure medication, has lost 60 lbs   Hypothyroidism    on synthroid   Inguinal muscle strain    Insomnia    Left knee pain    "long time ago"   Multinodular goiter    Obesity    Pneumonia    Psoriasis    Tinnitus      Patient Active Problem List   Diagnosis Date Noted   Insomnia due to medical condition 11/28/2020   Snoring 11/28/2020   Complex partial seizures with impaired consciousness at onset [facility]) 10/27/2020   Cognitive complaints 10/27/2020   Daytime somnolence 10/27/2020   Hypothyroidism 10/27/2020   Uncontrolled type 2 diabetes mellitus with hyperglycemia ([place]) 10/27/2020   Chronic post concussive encephalopathy 10/27/2020   Abdominal distention    Diarrhea    Rectal bleeding 03/08/2019   Abnormal liver function 03/08/2019   Portal vein thrombosis 03/07/2019   Ascites of liver 03/07/2019   Abdominal pain 03/07/2019   Unspecified essential hypertension 04/29/2014   Other and unspecified hyperlipidemia 04/29/2014    Past Surgical History:  Procedure Laterality Date   APPENDECTOMY  1976   FOOT SURGERY Bilateral 1975   NOSE SURGERY     ROTATOR CUFF REPAIR Left    left   SHOULDER SURGERY Right 12/20/2019   [place]        Family History  Problem Relation Age of Onset   Breast cancer Mother    Hyperlipidemia Mother    Other Mother        ? form of parkinson's    Parkinsonism Mother  Bladder Cancer Father    Parkinson's disease Father    Memory loss Maternal Grandmother        in her 46s   Birth defects Maternal Grandfather    Birth defects Paternal Grandmother    Colon cancer Neg Hx    Esophageal cancer Neg Hx    Rectal cancer Neg Hx    Stomach cancer Neg Hx    Colon polyps Neg Hx    Dementia Neg Hx    Alzheimer's disease Neg Hx     Social History   Tobacco Use   Smoking status: Former    Types: Cigarettes    Quit date: 03/27/1994    Years since quitting: 27.2   Smokeless tobacco: Former    Types: Chew   Tobacco comments:    did chew as a teenager  Vaping Use   Vaping Use: Never used  Substance Use Topics   Alcohol use: Yes    Alcohol/week: 2.0 standard drinks    Types: 2 Glasses of wine per week    Comment: "very little"   Drug  use: No    Home Medications Prior to Admission medications   Medication Sig Start Date End Date Taking? Authorizing Provider  azelastine (ASTELIN) 0.1 % nasal spray Place 1 spray into both nostrils 2 (two) times daily. Use in each nostril as directed    [provider]  busPIRone (BUSPAR) 15 MG tablet Take 30 mg by mouth 2 (two) times daily.    [provider]  Cholecalciferol (VITAMIN D3) 1000 UNITS CAPS Take 1 capsule by mouth daily.     [provider]  colesevelam (WELCHOL) 625 MG tablet Take 1,875 mg by mouth daily.    [provider]  escitalopram (LEXAPRO) 10 MG tablet Take 10 mg by mouth daily. 01/27/21   [provider]  ezetimibe (ZETIA) 10 MG tablet Take 10 mg by mouth daily.    [provider]  fluticasone (FLONASE) 50 MCG/ACT nasal spray Place 1 spray into both nostrils daily as needed for allergies.  01/05/13   [provider]  furosemide (LASIX) 40 MG tablet Take 1 tablet (40 mg total) by mouth daily. 03/31/21   Cirigliano, Vito V, DO  ipratropium (ATROVENT) 0.06 % nasal spray Place 1 spray into the nose daily. 02/17/19   [provider]  Ixekizumab (TALTZ [place]) Inject into the skin every 14 (fourteen) days.    [provider]  Javier Docker Oil 500 MG CAPS Take 500 mg by mouth daily.    [provider]  levETIRAcetam (KEPPRA XR) 500 MG 24 hr tablet Take 2 tablets (1,000 mg total) by mouth daily. 01/29/21   Melvenia Beam, MD  levocetirizine (XYZAL) 5 MG tablet Take 5 mg by mouth every evening.    [provider]  levothyroxine (SYNTHROID) 50 MCG tablet Take 50 mcg by mouth daily. 08/12/20   [provider]  metFORMIN (GLUCOPHAGE) 500 MG tablet Take 1,000 mg by mouth every evening.    [provider]  mupirocin cream (BACTROBAN) 2 % Apply 1 application topically 2 (two) times daily. Use BID , 1 week PRN for nasal crusting or scabbing 06/17/20   Rozetta Nunnery, MD   Omeprazole Magnesium (PRILOSEC OTC PO) Take 1 tablet by mouth daily.    [provider]  ondansetron (ZOFRAN ODT) 4 MG disintegrating tablet Take 1 tablet (4 mg total) by mouth every 8 (eight) hours as needed for nausea or vomiting. 02/08/20   Volanda Napoleon,  PA-C  sildenafil (REVATIO) 20 MG tablet Take 1 tablet by mouth as needed. 02/26/19   [provider]  spironolactone (ALDACTONE) 100 MG tablet Take 1 tablet (100 mg total) by mouth daily. 10/14/20   Cirigliano, Vito V, DO  thiamine 100 MG tablet Take 1 tablet (100 mg total) by mouth daily. 03/10/19   Mercy Riding, MD  vitamin E 1000 UNIT capsule Take 1,000 Units by mouth daily.    [provider]  zolpidem (AMBIEN) 5 MG tablet Take 5 mg by mouth at bedtime as needed for sleep.    [provider]    Allergies    Crestor [rosuvastatin calcium], Mobic [meloxicam], and Penicillins  Review of Systems   Review of Systems  Constitutional:  Positive for fatigue. Negative for fever.  HENT:  Negative for sore throat.   Eyes:  Negative for visual disturbance.  Respiratory:  Negative for cough and shortness of breath.   Cardiovascular:  Negative for chest pain.  Gastrointestinal:  Positive for abdominal pain, diarrhea, nausea and vomiting. Negative for constipation, hematemesis and hematochezia.  Genitourinary:  Positive for hematuria. Negative for dysuria.  Musculoskeletal:  Negative for neck pain.  Skin:  Negative for rash.  Neurological:  Negative for headaches.   Physical Exam Updated Vital Signs BP 125/64 (BP Location: Left Arm)   Pulse (!) 103   Temp 98.2 F (36.8 C) (Oral)   Resp 16   SpO2 96%   Physical Exam Vitals and nursing note reviewed.  Constitutional:      General: He is not in acute distress.    Appearance: Normal appearance. He is well-developed.  HENT:     Head: Normocephalic and atraumatic.  Eyes:     Conjunctiva/sclera: Conjunctivae normal.  Cardiovascular:     Rate and  Rhythm: Normal rate and regular rhythm.     Heart sounds: No murmur heard. Pulmonary:     Effort: Pulmonary effort is normal. No respiratory distress.     Breath sounds: Normal breath sounds.  Abdominal:     Palpations: Abdomen is soft.     Tenderness: There is no abdominal tenderness. There is no guarding or rebound.     Hernia: A hernia is present. Hernia is present in the umbilical area (reducable).  Musculoskeletal:        General: No swelling. Normal range of motion.     Cervical back: Neck supple.  Skin:    General: Skin is warm and dry.     Capillary Refill: Capillary refill takes less than 2 seconds.     Coloration: Skin is jaundiced.  Neurological:     General: No focal deficit present.     Mental Status: He is alert.     Sensory: No sensory deficit.     Motor: No weakness.  Psychiatric:        Mood and Affect: Mood normal.    ED Results / Procedures / Treatments   Labs (all labs ordered are listed, but only abnormal results are displayed) Labs Reviewed  COMPREHENSIVE METABOLIC PANEL - Abnormal; Notable for the following components:      Result Value   Sodium 129 (*)    Potassium 2.5 (*)    Chloride 81 (*)    CO2 34 (*)    Glucose, Bld 115 (*)    Calcium 7.8 (*)    AST 137 (*)    ALT 48 (*)    Alkaline Phosphatase 130 (*)    Total Bilirubin 16.1 (*)  All other components within normal limits  C DIFFICILE QUICK SCREEN W PCR REFLEX    GASTROINTESTINAL PANEL BY PCR, STOOL (REPLACES STOOL CULTURE)  LIPASE, BLOOD  CBC WITH DIFFERENTIAL/PLATELET  URINALYSIS, ROUTINE W REFLEX MICROSCOPIC    EKG None  Radiology CT Abdomen Pelvis W Contrast  Result Date: 07/02/2021 CLINICAL DATA:  Epigastric pain with nausea vomiting and diarrhea EXAM: CT ABDOMEN AND PELVIS WITH CONTRAST TECHNIQUE: Multidetector CT imaging of the abdomen and pelvis was performed using the standard protocol following bolus administration of intravenous contrast. CONTRAST:  78mL OMNIPAQUE  IOHEXOL 350 MG/ML SOLN COMPARISON:  CT 03/07/2019 FINDINGS: Lower chest: Lung bases demonstrate no acute consolidation or pleural effusion. Borderline cardiomegaly. Hepatobiliary: Liver cirrhosis. Diffuse micro nodularity of the liver without dominant hepatic mass. Distended gallbladder without calcified stone. No biliary dilatation. Pancreas: Unremarkable. No pancreatic ductal dilatation or surrounding inflammatory changes. Spleen: Mildly enlarged. Adrenals/Urinary Tract: Adrenal glands are unremarkable. Kidneys are normal, without renal calculi, focal lesion, or hydronephrosis. Bladder is unremarkable. Stomach/Bowel: Stomach nonenlarged. No dilated small bowel. Prominent submucosal fat deposition at the colon with collapsed appearance. No definite colon inflammatory process. Negative appendix. Diverticular disease of left colon. Vascular/Lymphatic: Recanalized paraumbilical vein. Nonaneurysmal aorta with atherosclerosis. No suspicious nodes. Reproductive: Prostate is unremarkable. Other: Negative for free air. Small amount of ascites within the abdomen and pelvis. Small fat and fluid containing umbilical hernia Musculoskeletal: No acute or significant osseous findings. IMPRESSION: 1. Liver cirrhosis with evidence of portal hypertension. Small volume of abdominopelvic ascites. Diffuse micronodularity of the liver parenchyma without dominant mass, When the patient is clinically stable and able to follow directions and hold their breath (preferably as an outpatient) further evaluation with dedicated abdominal MRI should be considered. 2. Dilated gallbladder without calcified stone. Correlation with right upper quadrant ultrasound as indicated 3. Negative for bowel obstruction. Favor collapsed appearance and prominent submucosal fat deposition of the colon as opposed to colitis type changes. 4. Left colon diverticular disease without acute inflammation. Electronically Signed   By: Jasmine Pang M.D.   On: 07/02/2021  17:57   DG CHEST PORT 1 VIEW  Result Date: 07/03/2021 CLINICAL DATA:  Abdominal pain EXAM: PORTABLE CHEST 1 VIEW COMPARISON:  Chest x-ray dated April 06, 2017 FINDINGS: Cardiac and mediastinal contours are unchanged. No evidence of pleural effusion or pneumothorax. Mild left basilar opacities, likely due to atelectasis. IMPRESSION: No active disease. Electronically Signed   By: Allegra Lai M.D.   On: 07/03/2021 10:25   US Abdomen Limited RUQ (LIVER/GB)  Result Date: 07/03/2021 CLINICAL DATA:  Distended gallbladder EXAM: ULTRASOUND ABDOMEN LIMITED RIGHT UPPER QUADRANT COMPARISON:  CT abdomen and pelvis dated July 02, 2021 FINDINGS: Gallbladder: Gallbladder wall thickening, measuring up to 7 mm. No gallstones. No sonographic Murphy sign noted by sonographer. Common bile duct: Diameter: 5 mm Liver: No focal lesion identified. Nodular liver contour. Increased parenchymal echogenicity. Portal vein is patent on color Doppler imaging with normal direction of blood flow towards the liver. Other: Small volume ascites. IMPRESSION: 1. Gallbladder wall thickening with no gallstones and negative sonographic Murphy sign, gallbladder wall thickening may be reactive given presence of ascites. 2. Cirrhotic liver morphology and small volume abdominal ascites. Electronically Signed   By: Allegra Lai M.D.   On: 07/03/2021 10:28    Procedures Procedures   Medications Ordered in ED Medications  enoxaparin (LOVENOX) injection 40 mg (40 mg Subcutaneous Patient Refused/Not Given 07/02/21 2237)  colesevelam [facility]) tablet 625 mg (has no administration in time range)  ezetimibe (ZETIA)  tablet 10 mg (has no administration in time range)  busPIRone (BUSPAR) tablet 25-30 mg (has no administration in time range)  zolpidem (AMBIEN) tablet 5 mg (5 mg Oral Given 07/03/21 0047)  levothyroxine (SYNTHROID) tablet 75 mcg (75 mcg Oral Given 07/03/21 0556)  busPIRone (BUSPAR) tablet 15 mg (has no administration in time  range)  ondansetron (ZOFRAN) injection 4 mg (has no administration in time range)  levETIRAcetam (KEPPRA XR) 24 hr tablet 1,000 mg (1,000 mg Oral Given 07/03/21 0047)  potassium chloride 10 mEq in 100 mL IVPB (0 mEq Intravenous Stopped 07/03/21 0356)  potassium chloride SA (KLOR-CON M) CR tablet 40 mEq (has no administration in time range)  pantoprazole (PROTONIX) injection 40 mg (has no administration in time range)  cefTRIAXone (ROCEPHIN) 2 g in sodium chloride 0.9 % 100 mL IVPB (has no administration in time range)  ondansetron (ZOFRAN) injection 4 mg (4 mg Intravenous Given 07/02/21 1716)  morphine 4 MG/ML injection 4 mg (4 mg Intravenous Given 07/02/21 1716)  sodium chloride 0.9 % bolus 1,000 mL (0 mLs Intravenous Stopped 07/02/21 1924)  iohexol (OMNIPAQUE) 350 MG/ML injection 80 mL (80 mLs Intravenous Contrast Given 07/02/21 1730)  potassium chloride 10 mEq in 100 mL IVPB (0 mEq Intravenous Stopped 07/02/21 2338)  magnesium sulfate IVPB 2 g 50 mL (0 g Intravenous Stopped 07/03/21 0146)  potassium chloride SA (KLOR-CON M) CR tablet 40 mEq (40 mEq Oral Given 07/03/21 0145)  morphine 4 MG/ML injection 4 mg (4 mg Intravenous Given 07/03/21 0214)  potassium chloride SA (KLOR-CON M) CR tablet 40 mEq (40 mEq Oral Given 07/03/21 0556)    ED Course  I have reviewed the triage vital signs and the nursing notes.  Pertinent labs & imaging results that were available during my care of the patient were reviewed by me and considered in my medical decision making (see chart for details).  Clinical Course as of 07/03/21 1052  Thu Jul 02, 2021  1908 Discussed with Dr. Hilarie Fredrickson GI who recommends admission and [facility] will consult on him tomorrow. [MB]  1933 Discussed with Dr. Flossie Buffy [facility] hospital[facility] who will evaluate the patient for admission. [MB]    Clinical Course User Index [MB] Hayden Rasmussen, MD   MDM Rules/Calculators/A&P                          This patient complains of abdominal pain nausea  vomiting diarrhea jaundice; this involves an extensive number of treatment Options and is a complaint that carries with it a high risk of complications and Morbidity. The differential includes worsening liver failure, cirrhosis, obstruction, dehydration, metabolic derangement, infectious diarrhea  I ordered, reviewed and interpreted labs, which included CBC with normal white count, hemoglobin slightly lower than priors, INR elevated, chemistries with low sodium low potassium, elevated LFTs and markedly elevated bilirubin I ordered medication IV fluids IV potassium I ordered imaging studies which included CT abdomen and pelvis and I independently    visualized and interpreted imaging which showed evidence of cirrhosis small volume ascites Additional history obtained from patient's family member Previous records obtained and reviewed in epic including prior GI visits I consulted Dr. Luis Abed GI and Dr. Flossie Buffy [facility] hospital[facility] and discussed lab and imaging findings  After the interventions stated above, I reevaluated the patient and found patient was minimally symptomatic.  He is agreeable to admission to the hospital for further work-up.   Final Clinical Impression(s) / ED Diagnoses Final diagnoses:  Decompensated hepatic  cirrhosis ([place])  Hyponatremia  Hypokalemia    Rx / DC Orders ED Discharge Orders     None        Hayden Rasmussen, MD 07/03/21 1057

## 2021-07-03 ENCOUNTER — Inpatient Hospital Stay (HOSPITAL_COMMUNITY): Payer: BC Managed Care – PPO

## 2021-07-03 DIAGNOSIS — E876 Hypokalemia: Secondary | ICD-10-CM

## 2021-07-03 DIAGNOSIS — K7031 Alcoholic cirrhosis of liver with ascites: Principal | ICD-10-CM

## 2021-07-03 DIAGNOSIS — E871 Hypo-osmolality and hyponatremia: Secondary | ICD-10-CM | POA: Diagnosis not present

## 2021-07-03 LAB — BILIRUBIN, FRACTIONATED(TOT/DIR/INDIR)
Bilirubin, Direct: 5.6 mg/dL — ABNORMAL HIGH (ref 0.0–0.2)
Indirect Bilirubin: 8.9 mg/dL — ABNORMAL HIGH (ref 0.3–0.9)
Total Bilirubin: 14.5 mg/dL — ABNORMAL HIGH (ref 0.3–1.2)

## 2021-07-03 LAB — COMPREHENSIVE METABOLIC PANEL
ALT: 36 U/L (ref 0–44)
AST: 106 U/L — ABNORMAL HIGH (ref 15–41)
Albumin: 2.7 g/dL — ABNORMAL LOW (ref 3.5–5.0)
Alkaline Phosphatase: 96 U/L (ref 38–126)
Anion gap: 9 (ref 5–15)
BUN: 9 mg/dL (ref 6–20)
CO2: 30 mmol/L (ref 22–32)
Calcium: 6.7 mg/dL — ABNORMAL LOW (ref 8.9–10.3)
Chloride: 88 mmol/L — ABNORMAL LOW (ref 98–111)
Creatinine, Ser: 0.96 mg/dL (ref 0.61–1.24)
GFR, Estimated: >60 mL/min (ref >60)
Glucose, Bld: 91 mg/dL (ref 70–99)
Potassium: 2.8 mmol/L — ABNORMAL LOW (ref 3.5–5.1)
Sodium: 127 mmol/L — ABNORMAL LOW (ref 135–145)
Total Bilirubin: 12.9 mg/dL — ABNORMAL HIGH (ref 0.3–1.2)
Total Protein: 5.4 g/dL — ABNORMAL LOW (ref 6.5–8.1)

## 2021-07-03 LAB — BASIC METABOLIC PANEL
Anion gap: 12 (ref 5–15)
Anion gap: 12 (ref 5–15)
BUN: 9 mg/dL (ref 6–20)
BUN: 9 mg/dL (ref 6–20)
CO2: 30 mmol/L (ref 22–32)
CO2: 30 mmol/L (ref 22–32)
Calcium: 6.7 mg/dL — ABNORMAL LOW (ref 8.9–10.3)
Calcium: 6.8 mg/dL — ABNORMAL LOW (ref 8.9–10.3)
Chloride: 84 mmol/L — ABNORMAL LOW (ref 98–111)
Chloride: 86 mmol/L — ABNORMAL LOW (ref 98–111)
Creatinine, Ser: 1.01 mg/dL (ref 0.61–1.24)
Creatinine, Ser: 1.04 mg/dL (ref 0.61–1.24)
GFR, Estimated: >60 mL/min (ref >60)
GFR, Estimated: >60 mL/min (ref >60)
Glucose, Bld: 96 mg/dL (ref 70–99)
Glucose, Bld: 97 mg/dL (ref 70–99)
Potassium: 2.4 mmol/L — CL (ref 3.5–5.1)
Potassium: 2.4 mmol/L — CL (ref 3.5–5.1)
Sodium: 126 mmol/L — ABNORMAL LOW (ref 135–145)
Sodium: 128 mmol/L — ABNORMAL LOW (ref 135–145)

## 2021-07-03 LAB — PROTIME-INR
INR: 2 — ABNORMAL HIGH (ref 0.8–1.2)
Prothrombin Time: 22.9 seconds — ABNORMAL HIGH (ref 11.4–15.2)

## 2021-07-03 LAB — OSMOLALITY, URINE: Osmolality, Ur: 375 mOsm/kg (ref 300–900)

## 2021-07-03 LAB — MAGNESIUM: Magnesium: 1.1 mg/dL — ABNORMAL LOW (ref 1.7–2.4)

## 2021-07-03 LAB — AMMONIA: Ammonia: 107 umol/L — ABNORMAL HIGH (ref 9–35)

## 2021-07-03 LAB — SODIUM, URINE, RANDOM: Sodium, Ur: 25 mmol/L

## 2021-07-03 MED ORDER — LEVETIRACETAM ER 500 MG PO TB24
1000.0000 mg | ORAL_TABLET | Freq: Every day | ORAL | Status: DC
  Administered 2021-07-03 – 2021-07-05 (×4): 1000 mg via ORAL
  Filled 2021-07-03 (×5): qty 2

## 2021-07-03 MED ORDER — POTASSIUM CHLORIDE CRYS ER 20 MEQ PO TBCR
40.0000 meq | EXTENDED_RELEASE_TABLET | Freq: Once | ORAL | Status: AC
  Administered 2021-07-03: 40 meq via ORAL
  Filled 2021-07-03: qty 2

## 2021-07-03 MED ORDER — MORPHINE SULFATE (PF) 4 MG/ML IV SOLN
4.0000 mg | Freq: Once | INTRAVENOUS | Status: AC
  Administered 2021-07-03: 4 mg via INTRAVENOUS
  Filled 2021-07-03: qty 1

## 2021-07-03 MED ORDER — POTASSIUM CHLORIDE 10 MEQ/100ML IV SOLN
10.0000 meq | INTRAVENOUS | Status: DC
  Administered 2021-07-03: 10 meq via INTRAVENOUS
  Filled 2021-07-03: qty 100

## 2021-07-03 MED ORDER — BUSPIRONE HCL 5 MG PO TABS
22.5000 mg | ORAL_TABLET | Freq: Every day | ORAL | Status: DC
  Administered 2021-07-03 – 2021-07-05 (×3): 22.5 mg via ORAL
  Filled 2021-07-03 (×3): qty 1

## 2021-07-03 MED ORDER — OXYCODONE HCL 5 MG PO TABS
5.0000 mg | ORAL_TABLET | Freq: Four times a day (QID) | ORAL | Status: DC | PRN
  Administered 2021-07-03 – 2021-07-06 (×8): 5 mg via ORAL
  Filled 2021-07-03 (×8): qty 1

## 2021-07-03 MED ORDER — SODIUM CHLORIDE 0.9 % IV SOLN
2.0000 g | INTRAVENOUS | Status: DC
  Administered 2021-07-03 – 2021-07-06 (×4): 2 g via INTRAVENOUS
  Filled 2021-07-03 (×4): qty 20

## 2021-07-03 MED ORDER — PANTOPRAZOLE SODIUM 40 MG IV SOLR
40.0000 mg | INTRAVENOUS | Status: DC
  Administered 2021-07-03 – 2021-07-04 (×2): 40 mg via INTRAVENOUS
  Filled 2021-07-03 (×2): qty 40

## 2021-07-03 MED ORDER — MAGNESIUM SULFATE 2 GM/50ML IV SOLN
2.0000 g | Freq: Once | INTRAVENOUS | Status: AC
  Administered 2021-07-03: 2 g via INTRAVENOUS
  Filled 2021-07-03: qty 50

## 2021-07-03 NOTE — ED Notes (Signed)
Critical potassium K+ 2.4. Will redraw.

## 2021-07-03 NOTE — Consult Note (Addendum)
Referring Provider: Dr. Flora Lipps Primary Care Physician:  Deland Pretty, MD Primary Gastroenterologist:  Dr. Bryan Lemma   Reason for Consultation:  N/V/D with decompensated cirrhosis   HPI: Andrew Patton is a 60 y.o. 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   He developed epigastric pain with nausea, vomiting and diarrhea x 2 weeks and presented to the ED 07/02/2021 for further evaluation. Labs in the ED showed a sodium level 129.  Potassium 2.5.  Glucose 115.  BUN 9.  Creatinine 1.15.  Calcium 7.8.  Alk phos 130.  Total bili 16.1.  AST 137.  ALT 48.  Albumin 3.5 WBC 7.6.  Hemoglobin 12.1.  Platelet 136.  INR 1.7 Influenza A and B negative. Alcohol level was not done. Sars Coronavirus 2 negative. CTAP with contrast showed cirrhosis with portal hypertension, small volume of ascites, the gallbladder was dilated without obvious gallstones and left colon diverticulosis was noted.   He reported eating out a several restaurants 2 to 3 weeks ago and abruptly developed N/V, nonbloody diarrhea and central upper abdominal pain. No specific food triggers. He attempted to stay well hydrated, he took Imodium and rested without improvement. He reported vomiting once or twice daily x 2 weeks and also had dry heaves. No coffee ground or hematemesis. His diarrhea was initially "nonstop" then decreased to 1 to 3 episodes of brown watery to mud like diarrhea daily. Previously passed a normal brown stool most days. No rectal bleeding or black stools. He complains of abdominal distension, abdomen feels tight like a drum. His last paracentesis was 01/2019 (1.8L of peritoneal fluid was removed at that time without evidence of SBP).  He stopped taking his diuretics  about a week ago as he was concerned he was getting dehydrated. No recent antibiotics. No NSAID use. He traveled to [place] June 2022 and developed a cough, fever and  body aches and he was diagnosed with Covid 19 infection  but did not require hospital admission. His symptoms lingered for 3 to 4 weeks. He continues to have a mild cough which sometimes triggers dry heaves. He drank 2 to 3 glasses of wine while in [place]. Since then, he reported drinking 1 to 2 glasses of Prosecco most nights. He last drank alcohol 2 to 3 days ago, reported drinking 2 to 3 sips of Bourbon. His urine has been darker in color for the past few weeks. He has intermittent confusion episodes followed by neurology without any recent seizure activity. He reported his last seizure was 09/2020. He remains on Keppra. No overt confusion at this time.  He has lost approximately 10 pounds over the past month.  No night sweats.  He underwent surveillance EGD 04/16/2019 without evidence of esophageal or gastric varices.  He was found to have portal hypertensive gastropathy.  He underwent a screening colonoscopy in 2014 which was normal, no polyps.  He was last seen by Dr. Bryan Lemma in the outpatient GI clinic on 08/28/2020, at that time his cirrhosis was stable with a calculated MELD score of 19. Child Pugh score B. AST 75. ALT 41. Alk phos 144. T. Bili 5.1.  AFP 4.8.  He was not considered a liver transplant candidate secondary to continued alcohol use. He also noted having issues with delirium episodes/AMS and he was restarted on Lactulose at that time (ammonia level was normal) and  he was advised to follow up with neurology.  A brain MRI 09/23/2020 was unremarkable.  He was thought to have complex partial seizures from a prior TBI/concussion suffered in July 2021.  He was placed on Keppra and continues follow-up. He was advised to follow-up in the GI clinic in 3 to 4 months which was not done.  Previously vaccinated for hepatitis A and B,  serologies 03/2019 show immunity to hepatitis A and B.  CTAP with contrast 07/02/2021: 1. Liver cirrhosis with evidence of portal hypertension. Small volume of abdominopelvic ascites. Diffuse micronodularity of the liver parenchyma without dominant mass, When the patient is clinically stable and able to follow directions and hold their breath (preferably as an outpatient) further evaluation with dedicated abdominal MRI should be considered. 2. Dilated gallbladder without calcified stone. Correlation with right upper quadrant ultrasound as indicated 3. Negative for bowel obstruction. Favor collapsed appearance and prominent submucosal fat deposition of the colon as opposed to colitis type changes. 4. Left colon diverticular disease without acute inflammation.  EGD 04/16/2019:- Normal esophagus. - Z-line regular, 39 cm from the incisors. - Portal hypertensive gastropathy. Biopsied. - Normal duodenal bulb, first portion of the duodenum and second portion of the duodenum.  Colonoscopy 02/07/2013 by Dr. Sharlett Iles: Normal colonoscopy, no polyps Recall colonoscopy 10 years   Past Medical History:  Diagnosis Date   Allergy    Anxiety    Arthritis    Ascites    Diabetes (HCC)    Elevated transaminase level    Fatigue    Fatty liver    Hyperlipidemia    Hypertension    "under control", not on any blood pressure medication, has lost 60 lbs   Hypothyroidism    on synthroid   Inguinal muscle strain    Insomnia    Left knee pain    "long time ago"   Multinodular goiter    Obesity    Pneumonia    Psoriasis    Tinnitus     Past Surgical History:  Procedure Laterality Date   APPENDECTOMY  1976   FOOT SURGERY Bilateral 1975   NOSE SURGERY     ROTATOR CUFF REPAIR Left    left   SHOULDER SURGERY Right 12/20/2019   [place]     Prior to Admission medications   Medication Sig Start Date End Date Taking? Authorizing Provider  busPIRone (BUSPAR) 15 MG tablet  Take 15-30 mg by mouth See admin instructions. Take 15 mg by mouth in the morning and 22.5-30 mg after supper   Yes [provider]  clonazePAM (KLONOPIN) 0.5 MG tablet Take 0.5 mg by mouth See admin instructions. Take 0.5 mg by mouth at bedtime and an additional 0.5 mg once a day as needed for palpitations   Yes [provider]  colesevelam (WELCHOL) 625 MG tablet Take 625 mg by mouth daily with breakfast.   Yes [provider]  escitalopram (LEXAPRO) 10 MG tablet Take 5 mg by mouth daily. 01/27/21  Yes [provider]  ezetimibe (ZETIA) 10 MG tablet Take 10 mg by mouth daily.   Yes [provider]  fluticasone (FLONASE) 50 MCG/ACT nasal spray Place 1 spray into both nostrils at bedtime. 01/05/13  Yes [provider]  furosemide (LASIX) 40 MG tablet Take 1 tablet (40 mg total) by mouth daily. 03/31/21  Yes Cirigliano, Vito V, DO  levETIRAcetam (KEPPRA XR) 500 MG 24 hr tablet Take 2 tablets (1,000 mg total) by mouth daily. Patient taking differently: Take  1,000 mg by mouth daily after supper. 01/29/21  Yes Melvenia Beam, MD  levothyroxine (SYNTHROID) 75 MCG tablet Take 75 mcg by mouth daily before breakfast.   Yes [provider]  mupirocin cream (BACTROBAN) 2 % Apply 1 application topically 2 (two) times daily. Use BID , 1 week PRN for nasal crusting or scabbing Patient taking differently: Apply 1 application topically 2 (two) times daily as needed (for nasal crusting or scabbing). 06/17/20  Yes Rozetta Nunnery, MD  ondansetron (ZOFRAN ODT) 4 MG disintegrating tablet Take 1 tablet (4 mg total) by mouth every 8 (eight) hours as needed for nausea or vomiting. Patient taking differently: Take 4 mg by mouth every 8 (eight) hours as needed for nausea or vomiting (dissolve orally). 02/08/20  Yes Providence Lanius A, PA-C  zolpidem (AMBIEN) 5 MG tablet Take 5 mg by mouth at bedtime.   Yes [provider]  azelastine (ASTELIN) 0.1 %  nasal spray Place 1 spray into both nostrils 2 (two) times daily. Use in each nostril as directed Patient not taking: Reported on 07/02/2021    [provider]  Cholecalciferol (VITAMIN D3) 1000 UNITS CAPS Take 1 capsule by mouth daily.  Patient not taking: Reported on 07/02/2021    [provider]  ipratropium (ATROVENT) 0.06 % nasal spray Place 1 spray into the nose daily. Patient not taking: Reported on 07/02/2021 02/17/19   [provider]  Ixekizumab (TALTZ [place]) Inject into the skin every 14 (fourteen) days. Patient not taking: Reported on 07/02/2021    [provider]  Javier Docker Oil 500 MG CAPS Take 500 mg by mouth daily. Patient not taking: Reported on 07/02/2021    [provider]  levocetirizine (XYZAL) 5 MG tablet Take 5 mg by mouth every evening. Patient not taking: Reported on 07/02/2021    [provider]  Omeprazole Magnesium (PRILOSEC OTC PO) Take 1 tablet by mouth daily. Patient not taking: Reported on 07/02/2021    [provider]  sildenafil (REVATIO) 20 MG tablet Take 1 tablet by mouth as needed. Patient not taking: Reported on 07/02/2021 02/26/19   [provider]  spironolactone (ALDACTONE) 100 MG tablet Take 1 tablet (100 mg total) by mouth daily. Patient not taking: Reported on 07/02/2021 10/14/20   Cirigliano, Luanna Salk V, DO  thiamine 100 MG tablet Take 1 tablet (100 mg total) by mouth daily. Patient not taking: Reported on 07/02/2021 03/10/19   Mercy Riding, MD  vitamin E 1000 UNIT capsule Take 1,000 Units by mouth daily. Patient not taking: Reported on 07/02/2021    [provider]    Current Facility-Administered Medications  Medication Dose Route Frequency Provider Last Rate Last Admin   busPIRone (BUSPAR) tablet 15 mg  15 mg Oral Daily Tu, Ching T, DO       busPIRone (BUSPAR) tablet 25-30 mg  25-30 mg Oral QPC supper Tu, Ching T, DO       colesevelam [facility]) tablet 625 mg  625 mg Oral Q breakfast Tu,  Ching T, DO       enoxaparin (LOVENOX) injection 40 mg  40 mg Subcutaneous Q24H Tu, Ching T, DO       ezetimibe (ZETIA) tablet 10 mg  10 mg Oral Daily Tu, Ching T, DO       levETIRAcetam (KEPPRA XR) 24 hr tablet 1,000 mg  1,000 mg Oral QPC supper Tu, Ching T, DO   1,000 mg at 07/03/21 0047   levothyroxine (SYNTHROID) tablet 75 mcg  75  mcg Oral Q0600 Tu, Ching T, DO   75 mcg at 07/03/21 0556   ondansetron (ZOFRAN) injection 4 mg  4 mg Intravenous Q6H PRN Tu, Ching T, DO       potassium chloride SA (KLOR-CON M) CR tablet 40 mEq  40 mEq Oral Once Pokhrel, Laxman, MD       zolpidem (AMBIEN) tablet 5 mg  5 mg Oral QHS Tu, Ching T, DO   5 mg at 07/03/21 6256   Current Outpatient Medications  Medication Sig Dispense Refill   busPIRone (BUSPAR) 15 MG tablet Take 15-30 mg by mouth See admin instructions. Take 15 mg by mouth in the morning and 22.5-30 mg after supper     clonazePAM (KLONOPIN) 0.5 MG tablet Take 0.5 mg by mouth See admin instructions. Take 0.5 mg by mouth at bedtime and an additional 0.5 mg once a day as needed for palpitations     colesevelam (WELCHOL) 625 MG tablet Take 625 mg by mouth daily with breakfast.     escitalopram (LEXAPRO) 10 MG tablet Take 5 mg by mouth daily.     ezetimibe (ZETIA) 10 MG tablet Take 10 mg by mouth daily.     fluticasone (FLONASE) 50 MCG/ACT nasal spray Place 1 spray into both nostrils at bedtime.     furosemide (LASIX) 40 MG tablet Take 1 tablet (40 mg total) by mouth daily. 30 tablet 3   levETIRAcetam (KEPPRA XR) 500 MG 24 hr tablet Take 2 tablets (1,000 mg total) by mouth daily. (Patient taking differently: Take 1,000 mg by mouth daily after supper.) 180 tablet 3   levothyroxine (SYNTHROID) 75 MCG tablet Take 75 mcg by mouth daily before breakfast.     mupirocin cream (BACTROBAN) 2 % Apply 1 application topically 2 (two) times daily. Use BID , 1 week PRN for nasal crusting or scabbing (Patient taking differently: Apply 1 application topically 2 (two) times  daily as needed (for nasal crusting or scabbing).) 15 g 0   ondansetron (ZOFRAN ODT) 4 MG disintegrating tablet Take 1 tablet (4 mg total) by mouth every 8 (eight) hours as needed for nausea or vomiting. (Patient taking differently: Take 4 mg by mouth every 8 (eight) hours as needed for nausea or vomiting (dissolve orally).) 6 tablet 0   zolpidem (AMBIEN) 5 MG tablet Take 5 mg by mouth at bedtime.     azelastine (ASTELIN) 0.1 % nasal spray Place 1 spray into both nostrils 2 (two) times daily. Use in each nostril as directed (Patient not taking: Reported on 07/02/2021)     Cholecalciferol (VITAMIN D3) 1000 UNITS CAPS Take 1 capsule by mouth daily.  (Patient not taking: Reported on 07/02/2021)     ipratropium (ATROVENT) 0.06 % nasal spray Place 1 spray into the nose daily. (Patient not taking: Reported on 07/02/2021)     Ixekizumab (TALTZ [place]) Inject into the skin every 14 (fourteen) days. (Patient not taking: Reported on 07/02/2021)     Krill Oil 500 MG CAPS Take 500 mg by mouth daily. (Patient not taking: Reported on 07/02/2021)     levocetirizine (XYZAL) 5 MG tablet Take 5 mg by mouth every evening. (Patient not taking: Reported on 07/02/2021)     Omeprazole Magnesium (PRILOSEC OTC PO) Take 1 tablet by mouth daily. (Patient not taking: Reported on 07/02/2021)     sildenafil (REVATIO) 20 MG tablet Take 1 tablet by mouth as needed. (Patient not taking: Reported on 07/02/2021)     spironolactone (ALDACTONE) 100 MG tablet Take 1 tablet (  100 mg total) by mouth daily. (Patient not taking: Reported on 07/02/2021) 30 tablet 5   thiamine 100 MG tablet Take 1 tablet (100 mg total) by mouth daily. (Patient not taking: Reported on 07/02/2021) 90 tablet 0   vitamin E 1000 UNIT capsule Take 1,000 Units by mouth daily. (Patient not taking: Reported on 07/02/2021)      Allergies as of 07/02/2021 - Review Complete 07/02/2021  Allergen Reaction Noted   Crestor [rosuvastatin calcium] Other (See Comments) 03/15/2019   Meloxicam  Other (See Comments) 03/08/2019   Rosuvastatin Other (See Comments) 07/02/2021   Penicillins Rash 01/24/2013    Family History  Problem Relation Age of Onset   Breast cancer Mother    Hyperlipidemia Mother    Other Mother        ? form of parkinson's    Parkinsonism Mother    Bladder Cancer Father    Parkinson's disease Father    Memory loss Maternal Grandmother        in her 36s   Birth defects Maternal Grandfather    Birth defects Paternal Grandmother    Colon cancer Neg Hx    Esophageal cancer Neg Hx    Rectal cancer Neg Hx    Stomach cancer Neg Hx    Colon polyps Neg Hx    Dementia Neg Hx    Alzheimer's disease Neg Hx     Social History   Socioeconomic History   Marital status: Married    Spouse name: Not on file   Number of children: 2   Years of education: Not on file   Highest education level: Associate degree: academic program  Occupational History   Occupation: Press photographer  Tobacco Use   Smoking status: Former    Types: Cigarettes    Quit date: 03/27/1994    Years since quitting: 27.2   Smokeless tobacco: Former    Types: Chew   Tobacco comments:    did chew as a teenager  Vaping Use   Vaping Use: Never used  Substance and Sexual Activity   Alcohol use: Yes    Alcohol/week: 2.0 standard drinks    Types: 2 Glasses of wine per week    Comment: "very little"   Drug use: No   Sexual activity: Not on file  Other Topics Concern   Not on file  Social History Narrative   Lives at home with wife    Right handed   Caffeine: maybe 1-3 cups/week   Social Determinants of Health   Financial Resource Strain: Not on file  Food Insecurity: Not on file  Transportation Needs: Not on file  Physical Activity: Not on file  Stress: Not on file  Social Connections: Not on file  Intimate Partner Violence: Not on file    Review of Systems: Gen: Denies fever, sweats or chills. No weight loss.  CV: Denies chest pain, palpitations or edema. Resp: + Cough, no SOB.   GI: See HPI.  No GERD symptoms. GU : +blood orange urine.  MS: + Generalized weakness.  Derm: + Jaundice.  Psych: + Anxiety.  Heme: Denies easy bruising, bleeding. Neuro:  See HPI.  Endo:  Denies any problems with DM, thyroid or adrenal function.  Physical Exam: Vital signs in last 24 hours: Temp:  [98.2 F (36.8 C)] 98.2 F (36.8 C) (12/01 1445) Pulse Rate:  [74-103] 74 (12/02 0630) Resp:  [16-18] 17 (12/02 0630) BP: (92-125)/(55-66) 100/58 (12/02 0630) SpO2:  [90 %-97 %] 94 % (12/02 0630) Weight:  [85.7  kg] 85.7 kg (12/01 1840)   General: Fatigued appearing 60 year old male in no acute distress. Head:  Normocephalic and atraumatic. Eyes: Moderate scleral icterus.  Conjunctiva pink. Ears:  Normal auditory acuity. Nose:  No deformity, discharge or lesions. Mouth: Upper palate with jaundice discoloration.  Dentition intact. No ulcers or lesions.  Neck:  Supple. No lymphadenopathy or thyromegaly.  Lungs: Breath sounds clear throughout. Heart: Regular rate and rhythm.  Soft systolic murmur. Abdomen: Distended, moderate tenderness to the epigastric and supraumbilical area without rebound or guarding.  Significant hepatosplenomegaly.  No significant amount of ascites appreciated.  No mass.  Positive bowel sounds to all 4 quadrants. Rectal: Deferred. Musculoskeletal:  Symmetrical without gross deformities.  Pulses:  Normal pulses noted. Extremities:  Without clubbing or edema. Neurologic:  Alert and  oriented x4. No focal deficits.  No asterixis. Skin:  Intact without significant lesions or rashes. Psych:  Alert and cooperative. Normal mood and affect.  Intake/Output from previous day: 12/01 0701 - 12/02 0700 In: 1559.8 [I.V.:120.3; IV Piggyback:1439.5] Out: -  Intake/Output this shift: No intake/output data recorded.  Lab Results: Recent Labs    07/02/21 1539  WBC 7.6  HGB 12.1*  HCT 32.3*  PLT 135*   BMET Recent Labs    07/02/21 2340 07/03/21 0036  07/03/21 0500  NA 128* 126* 127*  K 2.4* 2.4* 2.8*  CL 86* 84* 88*  CO2 $Re'30 30 30  'iKn$ GLUCOSE 96 97 91  BUN $Re'9 9 9  'eKA$ CREATININE 1.01 1.04 0.96  CALCIUM 6.7* 6.8* 6.7*   LFT Recent Labs    07/02/21 2340 07/03/21 0500  PROT  --  5.4*  ALBUMIN  --  2.7*  AST  --  106*  ALT  --  36  ALKPHOS  --  96  BILITOT 14.5* 12.9*  BILIDIR 5.6*  --   IBILI 8.9*  --    PT/INR Recent Labs    07/02/21 1702  LABPROT 20.2*  INR 1.7*   Hepatitis Panel No results for input(s): HEPBSAG, HCVAB, HEPAIGM, HEPBIGM in the last 72 hours.    Studies/Results: CT Abdomen Pelvis W Contrast  Result Date: 07/02/2021 CLINICAL DATA:  Epigastric pain with nausea vomiting and diarrhea EXAM: CT ABDOMEN AND PELVIS WITH CONTRAST TECHNIQUE: Multidetector CT imaging of the abdomen and pelvis was performed using the standard protocol following bolus administration of intravenous contrast. CONTRAST:  84mL OMNIPAQUE IOHEXOL 350 MG/ML SOLN COMPARISON:  CT 03/07/2019 FINDINGS: Lower chest: Lung bases demonstrate no acute consolidation or pleural effusion. Borderline cardiomegaly. Hepatobiliary: Liver cirrhosis. Diffuse micro nodularity of the liver without dominant hepatic mass. Distended gallbladder without calcified stone. No biliary dilatation. Pancreas: Unremarkable. No pancreatic ductal dilatation or surrounding inflammatory changes. Spleen: Mildly enlarged. Adrenals/Urinary Tract: Adrenal glands are unremarkable. Kidneys are normal, without renal calculi, focal lesion, or hydronephrosis. Bladder is unremarkable. Stomach/Bowel: Stomach nonenlarged. No dilated small bowel. Prominent submucosal fat deposition at the colon with collapsed appearance. No definite colon inflammatory process. Negative appendix. Diverticular disease of left colon. Vascular/Lymphatic: Recanalized paraumbilical vein. Nonaneurysmal aorta with atherosclerosis. No suspicious nodes. Reproductive: Prostate is unremarkable. Other: Negative for free air. Small  amount of ascites within the abdomen and pelvis. Small fat and fluid containing umbilical hernia Musculoskeletal: No acute or significant osseous findings. IMPRESSION: 1. Liver cirrhosis with evidence of portal hypertension. Small volume of abdominopelvic ascites. Diffuse micronodularity of the liver parenchyma without dominant mass, When the patient is clinically stable and able to follow directions and hold their breath (preferably as an outpatient) further  evaluation with dedicated abdominal MRI should be considered. 2. Dilated gallbladder without calcified stone. Correlation with right upper quadrant ultrasound as indicated 3. Negative for bowel obstruction. Favor collapsed appearance and prominent submucosal fat deposition of the colon as opposed to colitis type changes. 4. Left colon diverticular disease without acute inflammation. Electronically Signed   By: Donavan Foil M.D.   On: 07/02/2021 17:57    IMPRESSION/PLAN:  65) 60 year old male with decompensated cirrhosis secondary to fatty liver disease and alcohol use disorder, ascites, splenomegaly and portal hypertensive gastropathy admitted to the hospital 12/1/20222 with N/V/D. Significantly elevated T. Bili level 16.1 ->12.9.  Admission MDF 47.8. MELD 23. MELD Na 29 per labs 12/1. CTAP with contrast consistent with cirrhosis, portal hypertension, splenomegaly, a small amount of ascites with a distended gallbladder without evidence of acute cholecystitis.  WBC 7.6.  Normal renal function.  He is afebrile.  Hemodynamically stable.  No overt hepatic encephalopathy at this time -RUQ sonogram to evaluate the gallbladder, assess if enough ascites to tap and rule out portal vein thrombosis  -Diagnostic paracentesis if enough ascites to tap to include cell count with diff, gram stain, aerobic/anaerobic culture albumin, protein and cytology  -Hold diuretics  -Rocephin 2gm IV Q 24 hrs for prophylaxis  -Chest xray to rule to rule out any concern for  infectious pulmonary process  -PT/INR, Ammonia level, CMV IgM, EBV IgM -CBC, hepatic panel, BMP and INR in am -PPI IV QD -Zofran 4 mg p.o. or IV every 6 hours as needed -GI pathogen panel and C. difficile PCR ordered, specimen not yet collected -Clear liquid diet for now -Monitor neuro status closely -Patient counseled no alcohol ever  -Eventual abdominal MRI, likely as an outpatient -Further recommendations per Dr.Braian Tijerina  2) Hyponatremia secondary to cirrhosis and diuretics.  Urine sodium level 25.  3) Hypokalemia, hypomagnesemia  -KCl replacement per the hospitalist  4) DM II  5) History of seizure disorder, TBI  Noralyn Pick  07/03/2021, 08:160(:42 AM  GI ATTENDING  History, laboratories, x-rays reviewed.  Patient personally seen and examined in the emergency room.  Agree with comprehensive consultation as outlined above.  60 year old patient with alcoholic cirrhosis, who continues to drink, presents to the hospital with severe malaise after problems with nausea, vomiting, diarrhea.  He was found to be dehydrated.  Had severe COVID several months ago.  Bilirubin significantly elevated over baseline, though a large portion is of the indirect nature.  Feeling a little better after hydration.  Abdominal ultrasound does not show significant ascites.  I suspect that he suffered from viral gastroenteritis.  He may also have an element of alcoholic hepatitis based on his liver test profile and ongoing alcohol consumption.  Care at this point is supportive.  Dr. Benson Norway did will be on-call this weekend and follow-up.  Docia Chuck. Geri Seminole., M.D. [facility] Division of Gastroenterology

## 2021-07-03 NOTE — Progress Notes (Addendum)
PROGRESS NOTE  Andrew Patton [id number] DOB: 1961/03/21 DOA: 07/02/2021 PCP: Deland Pretty, MD   LOS: 1 day   Brief narrative: Andrew Patton is a 60 y.o. male with past medical history history of psoriatic arthritis, cirrhosis of liver due to alcohol abuse, hypertension and hyperlipidemia presented to hospital with nausea, vomiting abdominal pain and distention.  Patient stated that he drinks weekly nowadays instead of daily and had been eating in the outside restaurants.  In the ED, patient was noted to be tachycardic.  Labs showed hyponatremia and hypokalemia with elevated AST and ALT.  Bilirubin was elevated at 16.1 from his usual baseline of around 3.  INR was elevated at 1.7.  CT scan of the abdomen and pelvis showed distended gallbladder without stones or biliary dilatation but cirrhosis of liver with small ascites.  Patient was then admitted hospital for further evaluation and treatment.  Assessment/Plan:  Principal Problem:   Hyperbilirubinemia Active Problems:   Hypothyroidism   Elevated liver enzymes   History of seizure   Hyponatremia   Hypokalemia   Depression   Anxiety   Abdominal pain, nausea vomiting with decompensated cirrhosis of liver with hyperbilirubinemia/elevated LFT, -not on transplant list due to ongoing ETOH use. Significant bilirubin elevation this time with elevated INR.  No biliary obstruction seen on CT abdomen.  Patient normally follows with [facility] Dr. Bryan Lemma.  GI has seen the patient today and recommended right upper quadrant ultrasound.  Right upper quadrant ultrasound showed gallbladder wall thickening with no gallstones cirrhotic liver with small volume abdominal ascites.  Diuretics on hold.  Rocephin has been started for SBP prophylaxis.  Chest x-ray without any infiltrate.  Viral panel has been sent including hepatic panel.  Continue PPI Zofran GI and C. difficile panel.  We will follow GI recommendation.   Hypokalemia Continue to  replenish aggressively.  Check levels in a.m.   Hyponatremia Latest sodium of 127.  Received normal saline overnight.  Could be secondary to alcoholic liver disease.  Check BMP in AM.   Hypomagnesemia Placed with IV magnesium sulfate.  Check magnesium level today.   History of seizure Continue continue Synthroid.  Keppra.   Hypothyroidism Continue Synthroid.   Type 2 diabetes Patient was on metformin which has recently been taken off by the patient's PCP.  Continue sliding scale insulin while in the hospital if needed.   Depression/anxiety Continue BuSpar, Lexapro on hold due to hyponatremia.   DVT prophylaxis: enoxaparin (LOVENOX) injection 40 mg Start: 07/02/21 2200    Code Status: Full code  Family Communication: Spoke with the patient at bedside.  Status is: Inpatient  Remains inpatient appropriate because: Decompensated cirrhosis of the liver.   Consultants: GI.  Procedures: None  Anti-infectives:  Rocephin IV  Anti-infectives (From admission, onward)    Start     Dose/Rate Route Frequency Ordered Stop   07/03/21 1100  cefTRIAXone (ROCEPHIN) 2 g in sodium chloride 0.9 % 100 mL IVPB        2 g 200 mL/hr over 30 Minutes Intravenous Every 24 hours 07/03/21 1007         Subjective: Today, patient was seen and examined at bedside.  Patient denies any nausea vomiting or overt abdominal pain at the time of my exam.  Objective: Vitals:   07/03/21 0630 07/03/21 1000  BP: (!) 100/58 116/61  Pulse: 74 89  Resp: 17 18  Temp:    SpO2: 94% 97%    Intake/Output Summary (Last 24 hours) at 07/03/2021 1146  Last data filed at 07/03/2021 0257 Gross per 24 hour  Intake 1559.75 ml  Output --  Net 1559.75 ml   Filed Weights   07/02/21 1840  Weight: 85.7 kg   Body mass index is 29.16 kg/m.   Physical Exam: GENERAL: Patient is alert awake and oriented. Not in obvious distress. HENT: Scleral icterus noted.  Pallor noted.. Pupils equally reactive to light.  Oral mucosa is moist NECK: is supple, no gross swelling noted. CHEST: Clear to auscultation. No crackles or wheezes.  Diminished breath sounds bilaterally. CVS: S1 and S2 heard, murmur noted.  Regular rate and rhythm.  ABDOMEN: Soft, mild tenderness over the epigastric and umbilical  area bowel sounds are present.  Distended abdomen with umbilical hernia.  Mild ascites noted. EXTREMITIES: No edema. CNS: Cranial nerves are intact. No focal motor deficits. SKIN: warm and dry without rashes.  Spider nevi present.  Data Review: I have personally reviewed the following laboratory data and studies,  CBC: Recent Labs  Lab 07/02/21 1539  WBC 7.6  NEUTROABS 4.9  HGB 12.1*  HCT 32.3*  MCV 94.7  PLT [id number]*   Basic Metabolic Panel: Recent Labs  Lab 07/02/21 1539 07/02/21 2340 07/03/21 0036 07/03/21 0500  NA 129* 128* 126* 127*  K 2.5* 2.4* 2.4* 2.8*  CL 81* 86* 84* 88*  CO2 34* 30 30 30   GLUCOSE 115* 96 97 91  BUN 9 9 9 9   CREATININE 1.15 1.01 1.04 0.96  CALCIUM 7.8* 6.7* 6.8* 6.7*  MG  --  1.1*  --   --    Liver Function Tests: Recent Labs  Lab 07/02/21 1539 07/02/21 2340 07/03/21 0500  AST 137*  --  106*  ALT 48*  --  36  ALKPHOS 130*  --  96  BILITOT 16.1* 14.5* 12.9*  PROT 7.1  --  5.4*  ALBUMIN 3.5  --  2.7*   Recent Labs  Lab 07/02/21 1539  LIPASE 45   No results for input(s): AMMONIA in the last 168 hours. Cardiac Enzymes: No results for input(s): CKTOTAL, CKMB, CKMBINDEX, TROPONINI in the last 168 hours. BNP (last 3 results) No results for input(s): BNP in the last 8760 hours.  ProBNP (last 3 results) No results for input(s): PROBNP in the last 8760 hours.  CBG: No results for input(s): GLUCAP in the last 168 hours. Recent Results (from the past 240 hour(s))  Resp Panel by RT-PCR (Flu A&B, Covid) Nasopharyngeal Swab     Status: None   Collection Time: 07/02/21  5:02 PM   Specimen: Nasopharyngeal Swab; Nasopharyngeal(NP) swabs in vial transport medium   Result Value Ref Range Status   SARS Coronavirus 2 by RT PCR NEGATIVE NEGATIVE Final    Comment: (NOTE) SARS-CoV-2 target nucleic acids are NOT DETECTED.  The SARS-CoV-2 RNA is generally detectable in upper respiratory specimens during the acute phase of infection. The lowest concentration of SARS-CoV-2 viral copies this assay can detect is 138 copies/mL. A negative result does not preclude SARS-Cov-2 infection and should not be used as the sole basis for treatment or other patient management decisions. A negative result may occur with  improper specimen collection/handling, submission of specimen other than nasopharyngeal swab, presence of viral mutation(s) within the areas targeted by this assay, and inadequate number of viral copies(<138 copies/mL). A negative result must be combined with clinical observations, patient history, and epidemiological information. The expected result is Negative.  Fact Sheet for Patients:  EntrepreneurPulse.com.au  Fact Sheet for Healthcare Providers:  IncredibleEmployment.be  This test is no t yet approved or cleared by the [place] and  has been authorized for detection and/or diagnosis of SARS-CoV-2 by FDA under an Emergency Use Authorization (EUA). This EUA will remain  in effect (meaning this test can be used) for the duration of the COVID-19 declaration under Section 564(b)(1) of the Act, 21 U.S.C.section 360bbb-3(b)(1), unless the authorization is terminated  or revoked sooner.       Influenza A by PCR NEGATIVE NEGATIVE Final   Influenza B by PCR NEGATIVE NEGATIVE Final    Comment: (NOTE) The Xpert Xpress SARS-CoV-2/FLU/RSV plus assay is intended as an aid in the diagnosis of influenza from Nasopharyngeal swab specimens and should not be used as a sole basis for treatment. Nasal washings and aspirates are unacceptable for Xpert Xpress SARS-CoV-2/FLU/RSV testing.  Fact Sheet for  Patients: BloggerCourse.com  Fact Sheet for Healthcare Providers: SeriousBroker.it  This test is not yet approved or cleared by the [place] FDA and has been authorized for detection and/or diagnosis of SARS-CoV-2 by FDA under an Emergency Use Authorization (EUA). This EUA will remain in effect (meaning this test can be used) for the duration of the COVID-19 declaration under Section 564(b)(1) of the Act, 21 U.S.C. section 360bbb-3(b)(1), unless the authorization is terminated or revoked.  Performed at [facility], [address], [place], [place] [address]      Studies: CT Abdomen Pelvis W Contrast  Result Date: 07/02/2021 CLINICAL DATA:  Epigastric pain with nausea vomiting and diarrhea EXAM: CT ABDOMEN AND PELVIS WITH CONTRAST TECHNIQUE: Multidetector CT imaging of the abdomen and pelvis was performed using the standard protocol following bolus administration of intravenous contrast. CONTRAST:  80mL OMNIPAQUE IOHEXOL 350 MG/ML SOLN COMPARISON:  CT 03/07/2019 FINDINGS: Lower chest: Lung bases demonstrate no acute consolidation or pleural effusion. Borderline cardiomegaly. Hepatobiliary: Liver cirrhosis. Diffuse micro nodularity of the liver without dominant hepatic mass. Distended gallbladder without calcified stone. No biliary dilatation. Pancreas: Unremarkable. No pancreatic ductal dilatation or surrounding inflammatory changes. Spleen: Mildly enlarged. Adrenals/Urinary Tract: Adrenal glands are unremarkable. Kidneys are normal, without renal calculi, focal lesion, or hydronephrosis. Bladder is unremarkable. Stomach/Bowel: Stomach nonenlarged. No dilated small bowel. Prominent submucosal fat deposition at the colon with collapsed appearance. No definite colon inflammatory process. Negative appendix. Diverticular disease of left colon. Vascular/Lymphatic: Recanalized paraumbilical vein. Nonaneurysmal aorta with  atherosclerosis. No suspicious nodes. Reproductive: Prostate is unremarkable. Other: Negative for free air. Small amount of ascites within the abdomen and pelvis. Small fat and fluid containing umbilical hernia Musculoskeletal: No acute or significant osseous findings. IMPRESSION: 1. Liver cirrhosis with evidence of portal hypertension. Small volume of abdominopelvic ascites. Diffuse micronodularity of the liver parenchyma without dominant mass, When the patient is clinically stable and able to follow directions and hold their breath (preferably as an outpatient) further evaluation with dedicated abdominal MRI should be considered. 2. Dilated gallbladder without calcified stone. Correlation with right upper quadrant ultrasound as indicated 3. Negative for bowel obstruction. Favor collapsed appearance and prominent submucosal fat deposition of the colon as opposed to colitis type changes. 4. Left colon diverticular disease without acute inflammation. Electronically Signed   By: Jasmine Pang M.D.   On: 07/02/2021 17:57   DG CHEST PORT 1 VIEW  Result Date: 07/03/2021 CLINICAL DATA:  Abdominal pain EXAM: PORTABLE CHEST 1 VIEW COMPARISON:  Chest x-ray dated April 06, 2017 FINDINGS: Cardiac and mediastinal contours are unchanged. No evidence of pleural effusion or pneumothorax. Mild left basilar opacities, likely due to  atelectasis. IMPRESSION: No active disease. Electronically Signed   By: Yetta Glassman M.D.   On: 07/03/2021 10:25   US Abdomen Limited RUQ (LIVER/GB)  Result Date: 07/03/2021 CLINICAL DATA:  Distended gallbladder EXAM: ULTRASOUND ABDOMEN LIMITED RIGHT UPPER QUADRANT COMPARISON:  CT abdomen and pelvis dated July 02, 2021 FINDINGS: Gallbladder: Gallbladder wall thickening, measuring up to 7 mm. No gallstones. No sonographic Murphy sign noted by sonographer. Common bile duct: Diameter: 5 mm Liver: No focal lesion identified. Nodular liver contour. Increased parenchymal echogenicity. Portal  vein is patent on color Doppler imaging with normal direction of blood flow towards the liver. Other: Small volume ascites. IMPRESSION: 1. Gallbladder wall thickening with no gallstones and negative sonographic Murphy sign, gallbladder wall thickening may be reactive given presence of ascites. 2. Cirrhotic liver morphology and small volume abdominal ascites. Electronically Signed   By: Yetta Glassman M.D.   On: 07/03/2021 10:28      Flora Lipps, MD  [facility] 07/03/2021  If 7PM-7AM, please contact night-coverage

## 2021-07-03 NOTE — ED Notes (Signed)
Confirmed with Blount, NP that she only wanted 2 additional potassium chloride bags hung.

## 2021-07-04 LAB — CBC
HCT: 27.4 % — ABNORMAL LOW (ref 39.0–52.0)
Hemoglobin: 10.2 g/dL — ABNORMAL LOW (ref 13.0–17.0)
MCH: 36.2 pg — ABNORMAL HIGH (ref 26.0–34.0)
MCHC: 37.2 g/dL — ABNORMAL HIGH (ref 30.0–36.0)
MCV: 97.2 fL (ref 80.0–100.0)
Platelets: 125 10*3/uL — ABNORMAL LOW (ref 150–400)
RBC: 2.82 MIL/uL — ABNORMAL LOW (ref 4.22–5.81)
RDW: 17.2 % — ABNORMAL HIGH (ref 11.5–15.5)
WBC: 5.5 10*3/uL (ref 4.0–10.5)
nRBC: 0 % (ref 0.0–0.2)

## 2021-07-04 LAB — COMPREHENSIVE METABOLIC PANEL
ALT: 37 U/L (ref 0–44)
AST: 104 U/L — ABNORMAL HIGH (ref 15–41)
Albumin: 2.7 g/dL — ABNORMAL LOW (ref 3.5–5.0)
Alkaline Phosphatase: 101 U/L (ref 38–126)
Anion gap: 6 (ref 5–15)
BUN: 8 mg/dL (ref 6–20)
CO2: 31 mmol/L (ref 22–32)
Calcium: 7.4 mg/dL — ABNORMAL LOW (ref 8.9–10.3)
Chloride: 92 mmol/L — ABNORMAL LOW (ref 98–111)
Creatinine, Ser: 0.97 mg/dL (ref 0.61–1.24)
GFR, Estimated: >60 mL/min (ref >60)
Glucose, Bld: 103 mg/dL — ABNORMAL HIGH (ref 70–99)
Potassium: 3 mmol/L — ABNORMAL LOW (ref 3.5–5.1)
Sodium: 129 mmol/L — ABNORMAL LOW (ref 135–145)
Total Bilirubin: 12.3 mg/dL — ABNORMAL HIGH (ref 0.3–1.2)
Total Protein: 5.6 g/dL — ABNORMAL LOW (ref 6.5–8.1)

## 2021-07-04 LAB — CMV IGM: CMV IgM: <30 AU/mL (ref 0.0–29.9)

## 2021-07-04 LAB — PHOSPHORUS: Phosphorus: 2.1 mg/dL — ABNORMAL LOW (ref 2.5–4.6)

## 2021-07-04 LAB — GLUCOSE, CAPILLARY: Glucose-Capillary: 138 mg/dL — ABNORMAL HIGH (ref 70–99)

## 2021-07-04 LAB — EPSTEIN-BARR VIRUS VCA, IGM: EBV VCA IgM: <36 U/mL (ref 0.0–35.9)

## 2021-07-04 LAB — HEPATITIS PANEL, ACUTE
HCV Ab: NONREACTIVE
Hep A IgM: NONREACTIVE
Hep B C IgM: NONREACTIVE
Hepatitis B Surface Ag: NONREACTIVE

## 2021-07-04 LAB — MAGNESIUM: Magnesium: 1.7 mg/dL (ref 1.7–2.4)

## 2021-07-04 MED ORDER — INSULIN ASPART 100 UNIT/ML IJ SOLN: 0.0000 [IU] | Freq: Every day | INTRAMUSCULAR | Status: DC

## 2021-07-04 MED ORDER — MAGNESIUM OXIDE -MG SUPPLEMENT 400 (240 MG) MG PO TABS
400.0000 mg | ORAL_TABLET | Freq: Two times a day (BID) | ORAL | Status: DC
  Administered 2021-07-04 – 2021-07-06 (×5): 400 mg via ORAL
  Filled 2021-07-04 (×5): qty 1

## 2021-07-04 MED ORDER — INSULIN ASPART 100 UNIT/ML IJ SOLN
0.0000 [IU] | Freq: Three times a day (TID) | INTRAMUSCULAR | Status: DC
  Administered 2021-07-05: 13:00:00 1 [IU] via SUBCUTANEOUS

## 2021-07-04 MED ORDER — K PHOS MONO-SOD PHOS DI & MONO 155-852-130 MG PO TABS
500.0000 mg | ORAL_TABLET | Freq: Three times a day (TID) | ORAL | Status: AC
  Administered 2021-07-04 – 2021-07-05 (×4): 500 mg via ORAL
  Filled 2021-07-04 (×4): qty 2

## 2021-07-04 MED ORDER — SPIRONOLACTONE 100 MG PO TABS
100.0000 mg | ORAL_TABLET | Freq: Every day | ORAL | Status: DC
  Administered 2021-07-04 – 2021-07-06 (×3): 100 mg via ORAL
  Filled 2021-07-04 (×3): qty 1

## 2021-07-04 MED ORDER — LACTULOSE 10 GM/15ML PO SOLN: 10.0000 g | Freq: Two times a day (BID) | ORAL | Status: DC

## 2021-07-04 NOTE — Progress Notes (Signed)
PROGRESS NOTE  Subjective: No new complaints.  Abdominal discomfort from distension.  Objective: Vital signs in last 24 hours: Temp:  [97.7 F (36.5 C)-98.3 F (36.8 C)] 98.1 F (36.7 C) (12/03 0428) Pulse Rate:  [81-91] 88 (12/03 0428) Resp:  [18-20] 20 (12/03 0428) BP: (103-116)/(60-69) 115/65 (12/03 0428) SpO2:  [94 %-100 %] 96 % (12/03 0428) Weight:  [79.5 kg] 79.5 kg (12/02 1642) Last BM Date: 07/02/21  Intake/Output from previous day: No intake/output data recorded. Intake/Output this shift: No intake/output data recorded.  General appearance: alert, no distress, and jaundiced GI: mildly diffusely tender, mild protrusion of the umbilicus  Lab Results: Recent Labs    07/02/21 1539  WBC 7.6  HGB 12.1*  HCT 32.3*  PLT 135*   BMET Recent Labs    07/02/21 2340 07/03/21 0036 07/03/21 0500  NA 128* 126* 127*  K 2.4* 2.4* 2.8*  CL 86* 84* 88*  CO2 30 30 30   GLUCOSE 96 97 91  BUN 9 9 9   CREATININE 1.01 1.04 0.96  CALCIUM 6.7* 6.8* 6.7*   LFT Recent Labs    07/02/21 2340 07/03/21 0500  PROT  --  5.4*  ALBUMIN  --  2.7*  AST  --  106*  ALT  --  36  ALKPHOS  --  96  BILITOT 14.5* 12.9*  BILIDIR 5.6*  --   IBILI 8.9*  --    PT/INR Recent Labs    07/02/21 1702 07/03/21 1225  LABPROT 20.2* 22.9*  INR 1.7* 2.0*   Hepatitis Panel No results for input(s): HEPBSAG, HCVAB, HEPAIGM, HEPBIGM in the last 72 hours. C-Diff No results for input(s): CDIFFTOX in the last 72 hours. Fecal Lactopherrin No results for input(s): FECLLACTOFRN in the last 72 hours.  Studies/Results: CT Abdomen Pelvis W Contrast  Result Date: 07/02/2021 CLINICAL DATA:  Epigastric pain with nausea vomiting and diarrhea EXAM: CT ABDOMEN AND PELVIS WITH CONTRAST TECHNIQUE: Multidetector CT imaging of the abdomen and pelvis was performed using the standard protocol following bolus administration of intravenous contrast. CONTRAST:  42mL OMNIPAQUE IOHEXOL 350 MG/ML SOLN COMPARISON:  CT  03/07/2019 FINDINGS: Lower chest: Lung bases demonstrate no acute consolidation or pleural effusion. Borderline cardiomegaly. Hepatobiliary: Liver cirrhosis. Diffuse micro nodularity of the liver without dominant hepatic mass. Distended gallbladder without calcified stone. No biliary dilatation. Pancreas: Unremarkable. No pancreatic ductal dilatation or surrounding inflammatory changes. Spleen: Mildly enlarged. Adrenals/Urinary Tract: Adrenal glands are unremarkable. Kidneys are normal, without renal calculi, focal lesion, or hydronephrosis. Bladder is unremarkable. Stomach/Bowel: Stomach nonenlarged. No dilated small bowel. Prominent submucosal fat deposition at the colon with collapsed appearance. No definite colon inflammatory process. Negative appendix. Diverticular disease of left colon. Vascular/Lymphatic: Recanalized paraumbilical vein. Nonaneurysmal aorta with atherosclerosis. No suspicious nodes. Reproductive: Prostate is unremarkable. Other: Negative for free air. Small amount of ascites within the abdomen and pelvis. Small fat and fluid containing umbilical hernia Musculoskeletal: No acute or significant osseous findings. IMPRESSION: 1. Liver cirrhosis with evidence of portal hypertension. Small volume of abdominopelvic ascites. Diffuse micronodularity of the liver parenchyma without dominant mass, When the patient is clinically stable and able to follow directions and hold their breath (preferably as an outpatient) further evaluation with dedicated abdominal MRI should be considered. 2. Dilated gallbladder without calcified stone. Correlation with right upper quadrant ultrasound as indicated 3. Negative for bowel obstruction. Favor collapsed appearance and prominent submucosal fat deposition of the colon as opposed to colitis type changes. 4. Left colon diverticular disease without acute inflammation. Electronically Signed  By: Jasmine Pang M.D.   On: 07/02/2021 17:57   DG CHEST PORT 1 VIEW  Result  Date: 07/03/2021 CLINICAL DATA:  Abdominal pain EXAM: PORTABLE CHEST 1 VIEW COMPARISON:  Chest x-ray dated April 06, 2017 FINDINGS: Cardiac and mediastinal contours are unchanged. No evidence of pleural effusion or pneumothorax. Mild left basilar opacities, likely due to atelectasis. IMPRESSION: No active disease. Electronically Signed   By: Allegra Lai M.D.   On: 07/03/2021 10:25   US Abdomen Limited RUQ (LIVER/GB)  Result Date: 07/03/2021 CLINICAL DATA:  Distended gallbladder EXAM: ULTRASOUND ABDOMEN LIMITED RIGHT UPPER QUADRANT COMPARISON:  CT abdomen and pelvis dated July 02, 2021 FINDINGS: Gallbladder: Gallbladder wall thickening, measuring up to 7 mm. No gallstones. No sonographic Murphy sign noted by sonographer. Common bile duct: Diameter: 5 mm Liver: No focal lesion identified. Nodular liver contour. Increased parenchymal echogenicity. Portal vein is patent on color Doppler imaging with normal direction of blood flow towards the liver. Other: Small volume ascites. IMPRESSION: 1. Gallbladder wall thickening with no gallstones and negative sonographic Murphy sign, gallbladder wall thickening may be reactive given presence of ascites. 2. Cirrhotic liver morphology and small volume abdominal ascites. Electronically Signed   By: Allegra Lai M.D.   On: 07/03/2021 10:28    Medications: Scheduled:  busPIRone  15 mg Oral Daily   busPIRone  22.5 mg Oral Q supper   colesevelam  625 mg Oral Q breakfast   enoxaparin (LOVENOX) injection  40 mg Subcutaneous Q24H   ezetimibe  10 mg Oral Daily   levETIRAcetam  1,000 mg Oral QPC supper   levothyroxine  75 mcg Oral Q0600   pantoprazole (PROTONIX) IV  40 mg Intravenous Q24H   spironolactone  100 mg Oral Daily   zolpidem  5 mg Oral QHS   Continuous:  cefTRIAXone (ROCEPHIN)  IV Stopped (07/03/21 1251)    Assessment/Plan: 1) Decompensated cirrhosis. 2) ETOH abuse - ? ETOH hepatitis. 3) Small ascites.   The patient's abdomen is distended.   Review of the CT scan does show that he has ascites, but it appears to be diffusely distributed throughout the abdomen.  There is not one single place that will allow for a paracentesis.  His renal function is intact, but he is hyponatremic.  Plan: 1) Start spironolactone. 2) 2 gram or less sodium diet. 3) Watch for signs or symptoms of withdrawal.  LOS: 2 days   Keven Soucy D 07/04/2021, 7:28 AM

## 2021-07-04 NOTE — Plan of Care (Signed)
  Problem: Safety: Goal: Ability to remain free from injury will improve Outcome: Progressing   Has not gotten up assisted this shift

## 2021-07-04 NOTE — Progress Notes (Addendum)
PROGRESS NOTE  Andrew Patton [id number] DOB: 1960/11/27 DOA: 07/02/2021 PCP: Deland Pretty, MD   LOS: 2 days   Brief narrative:  Andrew Patton is a 60 y.o. male with past medical history history of psoriatic arthritis, cirrhosis of liver due to alcohol abuse, hypertension and hyperlipidemia presented to hospital with nausea, vomiting abdominal pain and distention.  Patient stated that he drinks weekly nowadays instead of daily and had been eating in the outside restaurants.  In the ED, patient was noted to be tachycardic.  Labs showed hyponatremia and hypokalemia with elevated AST and ALT.  Bilirubin was elevated at 16.1 from his usual baseline of around 3.  INR was elevated at 1.7.  CT scan of the abdomen and pelvis showed distended gallbladder without stones or biliary dilatation but cirrhosis of liver with small ascites.  Patient was then admitted hospital for further evaluation and treatment.  Assessment/Plan:  Principal Problem:   Hyperbilirubinemia Active Problems:   Hypothyroidism   Elevated liver enzymes   History of seizure   Hyponatremia   Hypokalemia   Depression   Anxiety   Abdominal pain, nausea vomiting with decompensated cirrhosis of liver with hyperbilirubinemia/elevated LFT, -not on transplant list due to ongoing ETOH use. Significant bilirubin elevation at this time with elevated INR.  No biliary obstruction seen on CT abdomen.  Patient normally follows with [facility] Dr. Bryan Lemma.  GI has seen during this admission.  Right upper quadrant ultrasound showed gallbladder wall thickening with no gallstones, cirrhotic liver with small volume abdominal ascites.  Diuretics on hold.  Rocephin has been started for SBP prophylaxis.  Chest x-ray without any infiltrate.   Continue PPI Zofran.GI pathogen panel and C. difficile pending .  Ammonia was elevated at 107.  Patient has been started on spironolactone starting today.  Bilirubin trending down.  We will consider  lactulose to keep her bowels moving twice a day tomorrow if no bowel movements.   Hypokalemia Improved with replacement.  Will be given K-Phos today.   Hyponatremia Latest sodium of 129, likely secondary to alcoholic liver disease.  Monitor BMP closely.  Hypomagnesemia Improved.  Magnesium 1.7.  We will continue to replenish with p.o. magnesium oxide.  Hypophosphatemia.  We will replenish with K-Phos.  Check levels in a.m.   History of seizure Continue continue Synthroid.  Keppra.   Hypothyroidism Continue Synthroid.  Mild anemia.  Likely anemia of chronic disease.   Type 2 diabetes Patient was on metformin which has recently been taken off by the patient's PCP.  Continue sliding scale insulin while in the hospital if needed.   Depression/anxiety Continue BuSpar, Lexapro on hold due to hyponatremia.   DVT prophylaxis: enoxaparin (LOVENOX) injection 40 mg Start: 07/02/21 2200  Disposition. Home likely in 1 to 2 days  Code Status: Full code  Family Communication: Spoke with the patient at bedside.  Status is: Inpatient  Remains inpatient appropriate because: Decompensated cirrhosis of the liver.   Consultants: GI.  Procedures: None  Anti-infectives:  Rocephin IV  Anti-infectives (From admission, onward)    Start     Dose/Rate Route Frequency Ordered Stop   07/03/21 1100  cefTRIAXone (ROCEPHIN) 2 g in sodium chloride 0.9 % 100 mL IVPB        2 g 200 mL/hr over 30 Minutes Intravenous Every 24 hours 07/03/21 1007         Subjective: Today, patient was seen and examined at bedside.  Patient denies any overt abdominal pain but mild abdominal distention.  Denies any nausea or vomiting.  Has tolerated oral diet.  Has not had a bowel movement today.   Objective: Vitals:   07/04/21 0428 07/04/21 1331  BP: 115/65 101/62  Pulse: 88 80  Resp: 20 18  Temp: 98.1 F (36.7 C) 97.6 F (36.4 C)  SpO2: 96% 95%    Intake/Output Summary (Last 24 hours) at  07/04/2021 1400 Last data filed at 07/04/2021 1300 Gross per 24 hour  Intake 240 ml  Output 200 ml  Net 40 ml    Filed Weights   07/02/21 1840 07/03/21 1642  Weight: 85.7 kg 79.5 kg   Body mass index is 27.05 kg/m.   Physical Exam: GENERAL: Patient is alert awake and oriented. Not in obvious distress. HENT: Mild scleral pallor and icterus noted.  Pupils equally reactive to light. Oral mucosa is moist NECK: is supple, no gross swelling noted. CHEST: Clear to auscultation. No crackles or wheezes.  Diminished breath sounds bilaterally. CVS: S1 and S2 heard, murmur noted.  Regular rate and rhythm.  ABDOMEN: Soft,  Distended abdomen with umbilical hernia.   EXTREMITIES: No edema. CNS: Cranial nerves are intact. No focal motor deficits. SKIN: warm and dry without rashes.  Spider nevi present.  Data Review: I have personally reviewed the following laboratory data and studies,  CBC: Recent Labs  Lab 07/02/21 1539 07/04/21 0629  WBC 7.6 5.5  NEUTROABS 4.9  --   HGB 12.1* 10.2*  HCT 32.3* 27.4*  MCV 94.7 97.2  PLT 135* 125*    Basic Metabolic Panel: Recent Labs  Lab 07/02/21 1539 07/02/21 2340 07/03/21 0036 07/03/21 0500 07/04/21 0629  NA 129* 128* 126* 127* 129*  K 2.5* 2.4* 2.4* 2.8* 3.0*  CL 81* 86* 84* 88* 92*  CO2 34* 30 30 30 31   GLUCOSE 115* 96 97 91 103*  BUN 9 9 9 9 8   CREATININE 1.15 1.01 1.04 0.96 0.97  CALCIUM 7.8* 6.7* 6.8* 6.7* 7.4*  MG  --  1.1*  --   --  1.7  PHOS  --   --   --   --  2.1*    Liver Function Tests: Recent Labs  Lab 07/02/21 1539 07/02/21 2340 07/03/21 0500 07/04/21 0629  AST 137*  --  106* 104*  ALT 48*  --  36 37  ALKPHOS 130*  --  96 101  BILITOT 16.1* 14.5* 12.9* 12.3*  PROT 7.1  --  5.4* 5.6*  ALBUMIN 3.5  --  2.7* 2.7*    Recent Labs  Lab 07/02/21 1539  LIPASE 45    Recent Labs  Lab 07/03/21 1225  AMMONIA 107*   Cardiac Enzymes: No results for input(s): CKTOTAL, CKMB, CKMBINDEX, TROPONINI in the last 168  hours. BNP (last 3 results) No results for input(s): BNP in the last 8760 hours.  ProBNP (last 3 results) No results for input(s): PROBNP in the last 8760 hours.  CBG: No results for input(s): GLUCAP in the last 168 hours. Recent Results (from the past 240 hour(s))  Resp Panel by RT-PCR (Flu A&B, Covid) Nasopharyngeal Swab     Status: None   Collection Time: 07/02/21  5:02 PM   Specimen: Nasopharyngeal Swab; Nasopharyngeal(NP) swabs in vial transport medium  Result Value Ref Range Status   SARS Coronavirus 2 by RT PCR NEGATIVE NEGATIVE Final    Comment: (NOTE) SARS-CoV-2 target nucleic acids are NOT DETECTED.  The SARS-CoV-2 RNA is generally detectable in upper respiratory specimens during the acute phase of infection. The lowest  concentration of SARS-CoV-2 viral copies this assay can detect is 138 copies/mL. A negative result does not preclude SARS-Cov-2 infection and should not be used as the sole basis for treatment or other patient management decisions. A negative result may occur with  improper specimen collection/handling, submission of specimen other than nasopharyngeal swab, presence of viral mutation(s) within the areas targeted by this assay, and inadequate number of viral copies(<138 copies/mL). A negative result must be combined with clinical observations, patient history, and epidemiological information. The expected result is Negative.  Fact Sheet for Patients:  EntrepreneurPulse.com.au  Fact Sheet for Healthcare Providers:  IncredibleEmployment.be  This test is no t yet approved or cleared by the [place] FDA and  has been authorized for detection and/or diagnosis of SARS-CoV-2 by FDA under an Emergency Use Authorization (EUA). This EUA will remain  in effect (meaning this test can be used) for the duration of the COVID-19 declaration under Section 564(b)(1) of the Act, 21 U.S.C.section 360bbb-3(b)(1), unless the  authorization is terminated  or revoked sooner.       Influenza A by PCR NEGATIVE NEGATIVE Final   Influenza B by PCR NEGATIVE NEGATIVE Final    Comment: (NOTE) The Xpert Xpress SARS-CoV-2/FLU/RSV plus assay is intended as an aid in the diagnosis of influenza from Nasopharyngeal swab specimens and should not be used as a sole basis for treatment. Nasal washings and aspirates are unacceptable for Xpert Xpress SARS-CoV-2/FLU/RSV testing.  Fact Sheet for Patients: EntrepreneurPulse.com.au  Fact Sheet for Healthcare Providers: IncredibleEmployment.be  This test is not yet approved or cleared by the [place] FDA and has been authorized for detection and/or diagnosis of SARS-CoV-2 by FDA under an Emergency Use Authorization (EUA). This EUA will remain in effect (meaning this test can be used) for the duration of the COVID-19 declaration under Section 564(b)(1) of the Act, 21 U.S.C. section 360bbb-3(b)(1), unless the authorization is terminated or revoked.  Performed at [facility], [place] [address], [place], [place] [address]       Studies: CT Abdomen Pelvis W Contrast  Result Date: 07/02/2021 CLINICAL DATA:  Epigastric pain with nausea vomiting and diarrhea EXAM: CT ABDOMEN AND PELVIS WITH CONTRAST TECHNIQUE: Multidetector CT imaging of the abdomen and pelvis was performed using the standard protocol following bolus administration of intravenous contrast. CONTRAST:  22mL OMNIPAQUE IOHEXOL 350 MG/ML SOLN COMPARISON:  CT 03/07/2019 FINDINGS: Lower chest: Lung bases demonstrate no acute consolidation or pleural effusion. Borderline cardiomegaly. Hepatobiliary: Liver cirrhosis. Diffuse micro nodularity of the liver without dominant hepatic mass. Distended gallbladder without calcified stone. No biliary dilatation. Pancreas: Unremarkable. No pancreatic ductal dilatation or surrounding inflammatory changes. Spleen: Mildly enlarged.  Adrenals/Urinary Tract: Adrenal glands are unremarkable. Kidneys are normal, without renal calculi, focal lesion, or hydronephrosis. Bladder is unremarkable. Stomach/Bowel: Stomach nonenlarged. No dilated small bowel. Prominent submucosal fat deposition at the colon with collapsed appearance. No definite colon inflammatory process. Negative appendix. Diverticular disease of left colon. Vascular/Lymphatic: Recanalized paraumbilical vein. Nonaneurysmal aorta with atherosclerosis. No suspicious nodes. Reproductive: Prostate is unremarkable. Other: Negative for free air. Small amount of ascites within the abdomen and pelvis. Small fat and fluid containing umbilical hernia Musculoskeletal: No acute or significant osseous findings. IMPRESSION: 1. Liver cirrhosis with evidence of portal hypertension. Small volume of abdominopelvic ascites. Diffuse micronodularity of the liver parenchyma without dominant mass, When the patient is clinically stable and able to follow directions and hold their breath (preferably as an outpatient) further evaluation with dedicated abdominal MRI should be considered. 2.  Dilated gallbladder without calcified stone. Correlation with right upper quadrant ultrasound as indicated 3. Negative for bowel obstruction. Favor collapsed appearance and prominent submucosal fat deposition of the colon as opposed to colitis type changes. 4. Left colon diverticular disease without acute inflammation. Electronically Signed   By: Jasmine Pang M.D.   On: 07/02/2021 17:57   DG CHEST PORT 1 VIEW  Result Date: 07/03/2021 CLINICAL DATA:  Abdominal pain EXAM: PORTABLE CHEST 1 VIEW COMPARISON:  Chest x-ray dated April 06, 2017 FINDINGS: Cardiac and mediastinal contours are unchanged. No evidence of pleural effusion or pneumothorax. Mild left basilar opacities, likely due to atelectasis. IMPRESSION: No active disease. Electronically Signed   By: Allegra Lai M.D.   On: 07/03/2021 10:25   US Abdomen Limited  RUQ (LIVER/GB)  Result Date: 07/03/2021 CLINICAL DATA:  Distended gallbladder EXAM: ULTRASOUND ABDOMEN LIMITED RIGHT UPPER QUADRANT COMPARISON:  CT abdomen and pelvis dated July 02, 2021 FINDINGS: Gallbladder: Gallbladder wall thickening, measuring up to 7 mm. No gallstones. No sonographic Murphy sign noted by sonographer. Common bile duct: Diameter: 5 mm Liver: No focal lesion identified. Nodular liver contour. Increased parenchymal echogenicity. Portal vein is patent on color Doppler imaging with normal direction of blood flow towards the liver. Other: Small volume ascites. IMPRESSION: 1. Gallbladder wall thickening with no gallstones and negative sonographic Murphy sign, gallbladder wall thickening may be reactive given presence of ascites. 2. Cirrhotic liver morphology and small volume abdominal ascites. Electronically Signed   By: Allegra Lai M.D.   On: 07/03/2021 10:28      Joycelyn Das, MD  [facility] 07/04/2021  If 7PM-7AM, please contact night-coverage

## 2021-07-05 LAB — CBC
HCT: 26.9 % — ABNORMAL LOW (ref 39.0–52.0)
Hemoglobin: 9.9 g/dL — ABNORMAL LOW (ref 13.0–17.0)
MCH: 36 pg — ABNORMAL HIGH (ref 26.0–34.0)
MCHC: 36.8 g/dL — ABNORMAL HIGH (ref 30.0–36.0)
MCV: 97.8 fL (ref 80.0–100.0)
Platelets: 136 10*3/uL — ABNORMAL LOW (ref 150–400)
RBC: 2.75 MIL/uL — ABNORMAL LOW (ref 4.22–5.81)
RDW: 17.6 % — ABNORMAL HIGH (ref 11.5–15.5)
WBC: 5.2 10*3/uL (ref 4.0–10.5)
nRBC: 0 % (ref 0.0–0.2)

## 2021-07-05 LAB — COMPREHENSIVE METABOLIC PANEL
ALT: 40 U/L (ref 0–44)
AST: 115 U/L — ABNORMAL HIGH (ref 15–41)
Albumin: 2.7 g/dL — ABNORMAL LOW (ref 3.5–5.0)
Alkaline Phosphatase: 101 U/L (ref 38–126)
Anion gap: 10 (ref 5–15)
BUN: 6 mg/dL (ref 6–20)
CO2: 32 mmol/L (ref 22–32)
Calcium: 7.9 mg/dL — ABNORMAL LOW (ref 8.9–10.3)
Chloride: 90 mmol/L — ABNORMAL LOW (ref 98–111)
Creatinine, Ser: 0.98 mg/dL (ref 0.61–1.24)
GFR, Estimated: >60 mL/min (ref >60)
Glucose, Bld: 98 mg/dL (ref 70–99)
Potassium: 2.8 mmol/L — ABNORMAL LOW (ref 3.5–5.1)
Sodium: 132 mmol/L — ABNORMAL LOW (ref 135–145)
Total Bilirubin: 11.1 mg/dL — ABNORMAL HIGH (ref 0.3–1.2)
Total Protein: 5.5 g/dL — ABNORMAL LOW (ref 6.5–8.1)

## 2021-07-05 LAB — AMMONIA: Ammonia: 78 umol/L — ABNORMAL HIGH (ref 9–35)

## 2021-07-05 LAB — GLUCOSE, CAPILLARY
Glucose-Capillary: 94 mg/dL (ref 70–99)
Glucose-Capillary: 145 mg/dL — ABNORMAL HIGH (ref 70–99)
Glucose-Capillary: 98 mg/dL (ref 70–99)
Glucose-Capillary: 126 mg/dL — ABNORMAL HIGH (ref 70–99)

## 2021-07-05 LAB — MAGNESIUM: Magnesium: 1.5 mg/dL — ABNORMAL LOW (ref 1.7–2.4)

## 2021-07-05 MED ORDER — LACTULOSE 10 GM/15ML PO SOLN
20.0000 g | Freq: Three times a day (TID) | ORAL | Status: DC
  Administered 2021-07-05 – 2021-07-06 (×3): 20 g via ORAL
  Filled 2021-07-05 (×4): qty 30

## 2021-07-05 MED ORDER — MAGNESIUM SULFATE 2 GM/50ML IV SOLN
2.0000 g | Freq: Once | INTRAVENOUS | Status: AC
  Administered 2021-07-05: 11:00:00 2 g via INTRAVENOUS
  Filled 2021-07-05: qty 50

## 2021-07-05 MED ORDER — POTASSIUM CHLORIDE 10 MEQ/100ML IV SOLN
10.0000 meq | INTRAVENOUS | Status: AC
  Administered 2021-07-05 (×4): 10 meq via INTRAVENOUS
  Filled 2021-07-05 (×4): qty 100

## 2021-07-05 MED ORDER — POTASSIUM CHLORIDE CRYS ER 20 MEQ PO TBCR
40.0000 meq | EXTENDED_RELEASE_TABLET | Freq: Once | ORAL | Status: AC
  Administered 2021-07-05: 09:00:00 40 meq via ORAL
  Filled 2021-07-05: qty 2

## 2021-07-05 MED ORDER — THIAMINE HCL 100 MG PO TABS
100.0000 mg | ORAL_TABLET | Freq: Every day | ORAL | Status: DC
  Administered 2021-07-06: 100 mg via ORAL
  Filled 2021-07-05: qty 1

## 2021-07-05 MED ORDER — FOLIC ACID 1 MG PO TABS
1.0000 mg | ORAL_TABLET | Freq: Every day | ORAL | Status: DC
  Administered 2021-07-06: 1 mg via ORAL
  Filled 2021-07-05: qty 1

## 2021-07-05 MED ORDER — ADULT MULTIVITAMIN W/MINERALS CH
1.0000 | ORAL_TABLET | Freq: Every day | ORAL | Status: DC
  Administered 2021-07-06: 1 via ORAL
  Filled 2021-07-05: qty 1

## 2021-07-05 MED ORDER — PHYTONADIONE 5 MG PO TABS
10.0000 mg | ORAL_TABLET | Freq: Every day | ORAL | Status: DC
  Administered 2021-07-05 – 2021-07-06 (×2): 10 mg via ORAL
  Filled 2021-07-05 (×2): qty 2

## 2021-07-05 MED ORDER — ENSURE ENLIVE PO LIQD
237.0000 mL | Freq: Three times a day (TID) | ORAL | Status: DC
  Administered 2021-07-06: 237 mL via ORAL

## 2021-07-05 NOTE — Progress Notes (Signed)
Initial Nutrition Assessment  DOCUMENTATION CODES:   Not applicable  INTERVENTION:   Ensure Enlive po TID, each supplement provides 350 kcal and 20 grams of protein  Recommend MVI, folic acid and thiamine po daily   Low sodium diet   Pt at high refeed risk; recommend monitor potassium, magnesium and phosphorus labs daily until stable  NUTRITION DIAGNOSIS:   Increased nutrient needs related to catabolic illness (cirrhosis) as evidenced by estimated needs.  GOAL:   Patient will meet greater than or equal to 90% of their needs  MONITOR:   PO intake, Supplement acceptance, Labs, Weight trends, Skin, I & O's  REASON FOR ASSESSMENT:   Malnutrition Screening Tool    ASSESSMENT:   60 y.o. 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  RD working remotely.  Unable to reach pt by phone. Per chart review, pt with nausea, vomiting and diarrhea for 2 weeks pta. Pt documented to be eating 75-100% of meals in hospital. RD will add supplements and vitamins to help pt meet his estimated needs. Pt is likely at refeed risk. Per chart, pt is down 29lbs(14%) over the past eight months; this is significant. RD will obtain nutrition related history and exam at follow-up.   Medications reviewed and include: lovenox, insulin, lactulose, synthroid, Mg oxide, Kphos, aldactone, ceftriaxone   Labs reviewed: Hgb 9.9(L), Hct 26.9(L)  NUTRITION - FOCUSED PHYSICAL EXAM: Unable to perform   Diet Order:   Diet Order             Diet 2 gram sodium Room service appropriate? Yes; Fluid consistency: Thin  Diet effective now                  EDUCATION  NEEDS:   Education needs have been addressed  Skin:  Skin Assessment: Reviewed RN Assessment  Last BM:  12/4  Height:   Ht Readings from Last 1 Encounters:  07/03/21 5' 7.5" (1.715 m)    Weight:   Wt Readings from Last 1 Encounters:  07/03/21 79.5 kg    Ideal Body Weight:  68.6 kg  BMI:  Body mass index is 27.05 kg/m.  Estimated Nutritional Needs:   Kcal:  2100-2400kcal/day  Protein:  105-120g/day  Fluid:  1.7-2.0L/day  Betsey Holiday MS, RD, LDN Please refer to [facility] for RD and/or RD on-call/weekend/after hours pager

## 2021-07-05 NOTE — Progress Notes (Signed)
PROGRESS NOTE  Andrew Patton ZOX:[id number] DOB: 1961/01/16 DOA: 07/02/2021 PCP: [name], MD   LOS: 3 days   Brief narrative:  Andrew Patton is a 60 y.o. male with past medical history history of psoriatic arthritis, cirrhosis of liver due to alcohol abuse, hypertension and hyperlipidemia presented to the hospital with nausea, vomiting, abdominal pain and distention.  Patient stated that he drinks weekly nowadays instead of daily and had been eating in the outside restaurants.  In the ED, patient was noted to be tachycardic.  Labs showed hyponatremia and hypokalemia with elevated AST and ALT.  Bilirubin was elevated at 16.1 from his usual baseline of around 3.  INR was elevated at 1.7.  CT scan of the abdomen and pelvis showed distended gallbladder without stones or biliary dilatation but cirrhosis of liver with small ascites.  Patient was then admitted hospital for further evaluation and treatment.  Assessment/Plan:  Principal Problem:   Hyperbilirubinemia Active Problems:   Hypothyroidism   Elevated liver enzymes   History of seizure   Hyponatremia   Hypokalemia   Depression   Anxiety  Decompensated cirrhosis of liver with hyperbilirubinemia/elevated LFT, -not on transplant list due to ongoing ETOH use. Significant bilirubin elevation on presentation with elevated INR.  No biliary obstruction seen on CT abdomen.  Patient normally follows with [facility], Dr. Barron Alvine.   Right upper quadrant ultrasound showed gallbladder wall thickening with no gallstones, cirrhotic liver with small volume abdominal ascites.  Empirically Rocephin was started for SBP prophylaxis due to abdominal pain.  Chest x-ray without any infiltrate.   Continue PPI, Zofran.unable to obtain GI pathogen panel and C. Difficile, ammonia was elevated at 107.  Patient has been started on spironolactone starting 09/04/2020.  Bilirubin trending down to 11.1 today.  We will give p.o. vitamin K x3 days.  Check INR in AM.   We will start the patient on lactulose since patient has not had a bowel movement in few days.  He was unable to sleep that months yesterday.   Hypokalemia Improved with replacement.  We will give her p.o. potassium, IV KCl and K-Phos due to significantly low potassium.  Potassium of 2.8 today.   Hyponatremia Latest sodium of 132, likely secondary to alcoholic liver disease.  Monitor BMP closely.  Consider Lasix when sodium has improved  Hypomagnesemia Magnesium again down to 1.5 today.  We will give 2 g of magnesium sulfate.  Continue oral magnesium oxide as well.  Check levels in a.m.  Hypophosphatemia.  We will replenish with K-Phos.  Check levels in a.m.   History of seizure Continue continue Synthroid.  Keppra.   Hypothyroidism Continue Synthroid.  Mild anemia.  Likely anemia of chronic disease.   Type 2 diabetes Patient was on metformin which has recently been taken off by the patient's PCP.  Continue sliding scale insulin while in the hospital.  Latest POC glucose of 94.   Depression/anxiety Continue BuSpar, Lexapro on hold due to hyponatremia.  DVT prophylaxis: enoxaparin (LOVENOX) injection 40 mg Start: 07/02/21 2200  Disposition. Home likely in 1 to 2 days if okay with GI.  Code Status: Full code  Family Communication:  Spoke with the patient at bedside.  Status is: Inpatient  Remains inpatient appropriate because: Decompensated cirrhosis of the liver, electrolyte imbalances,.   Consultants: GI.  Procedures: None  Anti-infectives:  Rocephin IV  Anti-infectives (From admission, onward)    Start     Dose/Rate Route Frequency Ordered Stop   07/03/21 1100  cefTRIAXone (  ROCEPHIN) 2 g in sodium chloride 0.9 % 100 mL IVPB        2 g 200 mL/hr over 30 Minutes Intravenous Every 24 hours 07/03/21 1007         Subjective: Today, patient was seen and examined at bedside.  Patient complains of mild abdominal distention.  Denies any nausea or vomiting.  Has  tolerated diet.  Has not had a bowel movement.  He states that he could not sleep that well yesterday.   Objective: Vitals:   07/04/21 2133 07/05/21 0605  BP: 103/60 (!) 102/59  Pulse: 88 83  Resp: 16 16  Temp: 97.8 F (36.6 C) 97.9 F (36.6 C)  SpO2: 94% 92%    Intake/Output Summary (Last 24 hours) at 07/05/2021 0809 Last data filed at 07/04/2021 1811 Gross per 24 hour  Intake 680 ml  Output 200 ml  Net 480 ml    Filed Weights   07/02/21 1840 07/03/21 1642  Weight: 85.7 kg 79.5 kg   Body mass index is 27.05 kg/m.   Physical Exam: General:  Average built, not in obvious distress HENT:   Scleral pallor noted, icteric oral mucosa is moist.  Chest:    Diminished breath sounds bilaterally. No crackles or wheezes.  CVS: S1 &S2 heard. No murmur.  Regular rate and rhythm. Abdomen: Soft, distended abdomen with umbilical hernia, bowel sounds are heard.   Extremities: No cyanosis, clubbing or edema.  Peripheral pulses are palpable. Psych: Alert, awake and oriented, normal mood CNS:  No cranial nerve deficits.  Power equal in all extremities.   Skin: Warm and dry.  Spider nevi present   Data Review: I have personally reviewed the following laboratory data and studies,  CBC: Recent Labs  Lab 07/02/21 1539 07/04/21 0629 07/05/21 0542  WBC 7.6 5.5 5.2  NEUTROABS 4.9  --   --   HGB 12.1* 10.2* 9.9*  HCT 32.3* 27.4* 26.9*  MCV 94.7 97.2 97.8  PLT 135* 125* 136*    Basic Metabolic Panel: Recent Labs  Lab 07/02/21 2340 07/03/21 0036 07/03/21 0500 07/04/21 0629 07/05/21 0542  NA 128* 126* 127* 129* 132*  K 2.4* 2.4* 2.8* 3.0* 2.8*  CL 86* 84* 88* 92* 90*  CO2 30 30 30 31  32  GLUCOSE 96 97 91 103* 98  BUN 9 9 9 8 6   CREATININE 1.01 1.04 0.96 0.97 0.98  CALCIUM 6.7* 6.8* 6.7* 7.4* 7.9*  MG 1.1*  --   --  1.7 1.5*  PHOS  --   --   --  2.1*  --     Liver Function Tests: Recent Labs  Lab 07/02/21 1539 07/02/21 2340 07/03/21 0500 07/04/21 0629 07/05/21 0542   AST 137*  --  106* 104* 115*  ALT 48*  --  36 37 40  ALKPHOS 130*  --  96 101 101  BILITOT 16.1* 14.5* 12.9* 12.3* 11.1*  PROT 7.1  --  5.4* 5.6* 5.5*  ALBUMIN 3.5  --  2.7* 2.7* 2.7*    Recent Labs  Lab 07/02/21 1539  LIPASE 45    Recent Labs  Lab 07/03/21 1225 07/05/21 0542  AMMONIA 107* 78*    Cardiac Enzymes: No results for input(s): CKTOTAL, CKMB, CKMBINDEX, TROPONINI in the last 168 hours. BNP (last 3 results) No results for input(s): BNP in the last 8760 hours.  ProBNP (last 3 results) No results for input(s): PROBNP in the last 8760 hours.  CBG: Recent Labs  Lab 07/04/21 2150 07/05/21 2151  GLUCAP 138* 94   Recent Results (from the past 240 hour(s))  Resp Panel by RT-PCR (Flu A&B, Covid) Nasopharyngeal Swab     Status: None   Collection Time: 07/02/21  5:02 PM   Specimen: Nasopharyngeal Swab; Nasopharyngeal(NP) swabs in vial transport medium  Result Value Ref Range Status   SARS Coronavirus 2 by RT PCR NEGATIVE NEGATIVE Final    Comment: (NOTE) SARS-CoV-2 target nucleic acids are NOT DETECTED.  The SARS-CoV-2 RNA is generally detectable in upper respiratory specimens during the acute phase of infection. The lowest concentration of SARS-CoV-2 viral copies this assay can detect is 138 copies/mL. A negative result does not preclude SARS-Cov-2 infection and should not be used as the sole basis for treatment or other patient management decisions. A negative result may occur with  improper specimen collection/handling, submission of specimen other than nasopharyngeal swab, presence of viral mutation(s) within the areas targeted by this assay, and inadequate number of viral copies(<138 copies/mL). A negative result must be combined with clinical observations, patient history, and epidemiological information. The expected result is Negative.  Fact Sheet for Patients:  BloggerCourse.com  Fact Sheet for Healthcare Providers:   SeriousBroker.it  This test is no t yet approved or cleared by the [place] FDA and  has been authorized for detection and/or diagnosis of SARS-CoV-2 by FDA under an Emergency Use Authorization (EUA). This EUA will remain  in effect (meaning this test can be used) for the duration of the COVID-19 declaration under Section 564(b)(1) of the Act, 21 U.S.C.section 360bbb-3(b)(1), unless the authorization is terminated  or revoked sooner.       Influenza A by PCR NEGATIVE NEGATIVE Final   Influenza B by PCR NEGATIVE NEGATIVE Final    Comment: (NOTE) The Xpert Xpress SARS-CoV-2/FLU/RSV plus assay is intended as an aid in the diagnosis of influenza from Nasopharyngeal swab specimens and should not be used as a sole basis for treatment. Nasal washings and aspirates are unacceptable for Xpert Xpress SARS-CoV-2/FLU/RSV testing.  Fact Sheet for Patients: BloggerCourse.com  Fact Sheet for Healthcare Providers: SeriousBroker.it  This test is not yet approved or cleared by the [place] FDA and has been authorized for detection and/or diagnosis of SARS-CoV-2 by FDA under an Emergency Use Authorization (EUA). This EUA will remain in effect (meaning this test can be used) for the duration of the COVID-19 declaration under Section 564(b)(1) of the Act, 21 U.S.C. section 360bbb-3(b)(1), unless the authorization is terminated or revoked.  Performed at [facility], [address] [place], [place] [address]       Studies: DG CHEST PORT 1 VIEW  Result Date: 07/03/2021 CLINICAL DATA:  Abdominal pain EXAM: PORTABLE CHEST 1 VIEW COMPARISON:  Chest x-ray dated April 06, 2017 FINDINGS: Cardiac and mediastinal contours are unchanged. No evidence of pleural effusion or pneumothorax. Mild left basilar opacities, likely due to atelectasis. IMPRESSION: No active disease. Electronically Signed    By: Allegra Lai M.D.   On: 07/03/2021 10:25   US Abdomen Limited RUQ (LIVER/GB)  Result Date: 07/03/2021 CLINICAL DATA:  Distended gallbladder EXAM: ULTRASOUND ABDOMEN LIMITED RIGHT UPPER QUADRANT COMPARISON:  CT abdomen and pelvis dated July 02, 2021 FINDINGS: Gallbladder: Gallbladder wall thickening, measuring up to 7 mm. No gallstones. No sonographic Murphy sign noted by sonographer. Common bile duct: Diameter: 5 mm Liver: No focal lesion identified. Nodular liver contour. Increased parenchymal echogenicity. Portal vein is patent on color Doppler imaging with normal direction of blood flow towards the liver. Other: Small  volume ascites. IMPRESSION: 1. Gallbladder wall thickening with no gallstones and negative sonographic Murphy sign, gallbladder wall thickening may be reactive given presence of ascites. 2. Cirrhotic liver morphology and small volume abdominal ascites. Electronically Signed   By: Allegra Lai M.D.   On: 07/03/2021 10:28      Joycelyn Das, MD  [facility] 07/05/2021  If 7PM-7AM, please contact night-coverage

## 2021-07-05 NOTE — Progress Notes (Signed)
Subjective: Feeling better.  Abdominal pain is improving.  Objective: Vital signs in last 24 hours: Temp:  [97.6 F (36.4 C)-97.9 F (36.6 C)] 97.9 F (36.6 C) (12/04 0605) Pulse Rate:  [80-88] 83 (12/04 0605) Resp:  [16-18] 16 (12/04 0605) BP: (101-103)/(59-62) 102/59 (12/04 0605) SpO2:  [92 %-95 %] 92 % (12/04 0605) Last BM Date: 07/02/21  Intake/Output from previous day: 12/03 0701 - 12/04 0700 In: 680 [P.O.:480; IV Piggyback:200] Out: 200 [Urine:200] Intake/Output this shift: No intake/output data recorded.  General appearance: alert and no distress GI: less distended, softer  Lab Results: Recent Labs    07/02/21 1539 07/04/21 0629 07/05/21 0542  WBC 7.6 5.5 5.2  HGB 12.1* 10.2* 9.9*  HCT 32.3* 27.4* 26.9*  PLT 135* 125* 136*   BMET Recent Labs    07/03/21 0036 07/03/21 0500 07/04/21 0629  NA 126* 127* 129*  K 2.4* 2.8* 3.0*  CL 84* 88* 92*  CO2 30 30 31   GLUCOSE 97 91 103*  BUN 9 9 8   CREATININE 1.04 0.96 0.97  CALCIUM 6.8* 6.7* 7.4*   LFT Recent Labs    07/02/21 2340 07/03/21 0500 07/04/21 0629  PROT  --    < > 5.6*  ALBUMIN  --    < > 2.7*  AST  --    < > 104*  ALT  --    < > 37  ALKPHOS  --    < > 101  BILITOT 14.5*   < > 12.3*  BILIDIR 5.6*  --   --   IBILI 8.9*  --   --    < > = values in this interval not displayed.   PT/INR Recent Labs    07/02/21 1702 07/03/21 1225  LABPROT 20.2* 22.9*  INR 1.7* 2.0*   Hepatitis Panel Recent Labs    07/04/21 1411  HEPBSAG NON REACTIVE  HCVAB NON REACTIVE  HEPAIGM NON REACTIVE  HEPBIGM NON REACTIVE   C-Diff No results for input(s): CDIFFTOX in the last 72 hours. Fecal Lactopherrin No results for input(s): FECLLACTOFRN in the last 72 hours.  Studies/Results: DG CHEST PORT 1 VIEW  Result Date: 07/03/2021 CLINICAL DATA:  Abdominal pain EXAM: PORTABLE CHEST 1 VIEW COMPARISON:  Chest x-ray dated April 06, 2017 FINDINGS: Cardiac and mediastinal contours are unchanged. No evidence of  pleural effusion or pneumothorax. Mild left basilar opacities, likely due to atelectasis. IMPRESSION: No active disease. Electronically Signed   By: 14/09/2020 M.D.   On: 07/03/2021 10:25   Allegra Lai Abdomen Limited RUQ (LIVER/GB)  Result Date: 07/03/2021 CLINICAL DATA:  Distended gallbladder EXAM: ULTRASOUND ABDOMEN LIMITED RIGHT UPPER QUADRANT COMPARISON:  CT abdomen and pelvis dated July 02, 2021 FINDINGS: Gallbladder: Gallbladder wall thickening, measuring up to 7 mm. No gallstones. No sonographic Murphy sign noted by sonographer. Common bile duct: Diameter: 5 mm Liver: No focal lesion identified. Nodular liver contour. Increased parenchymal echogenicity. Portal vein is patent on color Doppler imaging with normal direction of blood flow towards the liver. Other: Small volume ascites. IMPRESSION: 1. Gallbladder wall thickening with no gallstones and negative sonographic Murphy sign, gallbladder wall thickening may be reactive given presence of ascites. 2. Cirrhotic liver morphology and small volume abdominal ascites. Electronically Signed   By: 14/09/2020 M.D.   On: 07/03/2021 10:28    Medications: Scheduled:  busPIRone  15 mg Oral Daily   busPIRone  22.5 mg Oral Q supper   colesevelam  625 mg Oral Q breakfast   enoxaparin (LOVENOX) injection  40 mg Subcutaneous Q24H   ezetimibe  10 mg Oral Daily   insulin aspart  0-5 Units Subcutaneous QHS   insulin aspart  0-9 Units Subcutaneous TID WC   levETIRAcetam  1,000 mg Oral QPC supper   levothyroxine  75 mcg Oral Q0600   magnesium oxide  400 mg Oral BID   pantoprazole (PROTONIX) IV  40 mg Intravenous Q24H   phosphorus  500 mg Oral TID   spironolactone  100 mg Oral Daily   zolpidem  5 mg Oral QHS   Continuous:  cefTRIAXone (ROCEPHIN)  IV Stopped (07/04/21 1124)    Assessment/Plan: 1) Decompensated ETOH cirrhosis. 2) Ascites. 3) Hypokalemia. 4) Anemia. 5) Hyponatremia.   The patient responded to the spironolactone.  He is  improving.  A 2 gram sodium diet was discussed with him in detail as well as servings on a nutrition label.  He had a good understanding of being a low sodium diet, but now he knows to keep it below 2 grams per day.  There is an anemia, but no overt signs of bleeding.  He did not have varices on 04/16/2019, but with his hepatic decompensation he will benefit from a nonemergent EGD in the near future.  Plan: 1) Correct potassium. 2) 2 gram sodium diet. 3) Maintain spironolactone.  Lasix can be added once his serum sodium improves.  This can be done as an outpatient. 4) Anticipate D/C tomorrow AM.  LOS: 3 days   Leanne Sisler D 07/05/2021, 6:40 AM

## 2021-07-06 ENCOUNTER — Telehealth: Payer: Self-pay

## 2021-07-06 DIAGNOSIS — R188 Other ascites: Secondary | ICD-10-CM

## 2021-07-06 DIAGNOSIS — K7460 Unspecified cirrhosis of liver: Secondary | ICD-10-CM

## 2021-07-06 DIAGNOSIS — K7290 Hepatic failure, unspecified without coma: Secondary | ICD-10-CM

## 2021-07-06 LAB — COMPREHENSIVE METABOLIC PANEL
ALT: 39 U/L (ref 0–44)
AST: 116 U/L — ABNORMAL HIGH (ref 15–41)
Albumin: 2.6 g/dL — ABNORMAL LOW (ref 3.5–5.0)
Alkaline Phosphatase: 90 U/L (ref 38–126)
Anion gap: 6 (ref 5–15)
BUN: <5 mg/dL — ABNORMAL LOW (ref 6–20)
CO2: 32 mmol/L (ref 22–32)
Calcium: 7.7 mg/dL — ABNORMAL LOW (ref 8.9–10.3)
Chloride: 93 mmol/L — ABNORMAL LOW (ref 98–111)
Creatinine, Ser: 0.92 mg/dL (ref 0.61–1.24)
GFR, Estimated: >60 mL/min (ref >60)
Glucose, Bld: 101 mg/dL — ABNORMAL HIGH (ref 70–99)
Potassium: 3.1 mmol/L — ABNORMAL LOW (ref 3.5–5.1)
Sodium: 131 mmol/L — ABNORMAL LOW (ref 135–145)
Total Bilirubin: 10.8 mg/dL — ABNORMAL HIGH (ref 0.3–1.2)
Total Protein: 5.4 g/dL — ABNORMAL LOW (ref 6.5–8.1)

## 2021-07-06 LAB — GLUCOSE, CAPILLARY
Glucose-Capillary: 98 mg/dL (ref 70–99)
Glucose-Capillary: 103 mg/dL — ABNORMAL HIGH (ref 70–99)

## 2021-07-06 LAB — CBC
HCT: 25.5 % — ABNORMAL LOW (ref 39.0–52.0)
Hemoglobin: 9.5 g/dL — ABNORMAL LOW (ref 13.0–17.0)
MCH: 36.3 pg — ABNORMAL HIGH (ref 26.0–34.0)
MCHC: 37.3 g/dL — ABNORMAL HIGH (ref 30.0–36.0)
MCV: 97.3 fL (ref 80.0–100.0)
Platelets: 127 10*3/uL — ABNORMAL LOW (ref 150–400)
RBC: 2.62 MIL/uL — ABNORMAL LOW (ref 4.22–5.81)
RDW: 17.6 % — ABNORMAL HIGH (ref 11.5–15.5)
WBC: 5.5 10*3/uL (ref 4.0–10.5)
nRBC: 0 % (ref 0.0–0.2)

## 2021-07-06 LAB — PROTIME-INR
INR: 2 — ABNORMAL HIGH (ref 0.8–1.2)
Prothrombin Time: 22.9 seconds — ABNORMAL HIGH (ref 11.4–15.2)

## 2021-07-06 LAB — MAGNESIUM: Magnesium: 1.7 mg/dL (ref 1.7–2.4)

## 2021-07-06 LAB — PHOSPHORUS: Phosphorus: 3.6 mg/dL (ref 2.5–4.6)

## 2021-07-06 MED ORDER — FOLIC ACID 1 MG PO TABS: 1.0000 mg | ORAL_TABLET | Freq: Every day | ORAL | 0 refills | Status: AC

## 2021-07-06 MED ORDER — SPIRONOLACTONE 100 MG PO TABS: 100.0000 mg | ORAL_TABLET | Freq: Every day | ORAL | 5 refills | Status: DC

## 2021-07-06 MED ORDER — POTASSIUM CHLORIDE CRYS ER 20 MEQ PO TBCR
40.0000 meq | EXTENDED_RELEASE_TABLET | Freq: Once | ORAL | Status: AC
  Administered 2021-07-06: 40 meq via ORAL
  Filled 2021-07-06: qty 2

## 2021-07-06 MED ORDER — FUROSEMIDE 40 MG PO TABS: 40.0000 mg | ORAL_TABLET | Freq: Every day | ORAL | 3 refills | Status: DC

## 2021-07-06 MED ORDER — MAGNESIUM OXIDE -MG SUPPLEMENT 400 (240 MG) MG PO TABS
400.0000 mg | ORAL_TABLET | Freq: Two times a day (BID) | ORAL | 0 refills | Status: AC
Start: 2021-07-06 — End: 2021-07-16

## 2021-07-06 MED ORDER — ADULT MULTIVITAMIN W/MINERALS CH: 1.0000 | ORAL_TABLET | Freq: Every day | ORAL | Status: AC

## 2021-07-06 MED ORDER — THIAMINE HCL 100 MG PO TABS: 100.0000 mg | ORAL_TABLET | Freq: Every day | ORAL | 0 refills | Status: DC

## 2021-07-06 MED ORDER — LACTULOSE 10 GM/15ML PO SOLN: 10.0000 g | Freq: Two times a day (BID) | ORAL | Status: DC | PRN

## 2021-07-06 NOTE — Discharge Summary (Signed)
Physician Discharge Summary  Andrew Patton JJK:[id number] DOB: 05/15/1961 DOA: 07/02/2021  PCP: [name], MD  Admit date: 07/02/2021 Discharge date: 07/06/2021  Admitted From: Home  Discharge disposition: Home  Recommendations for Outpatient Follow-Up:   Follow up with your primary care provider in one week.  Check CBC, CMP, phosphorus, magnesium in the next visit Follow-up with [facility] Dr. Barron Alvine on Wednesday, 08/05/21 at 1:40 pm.  Discharge Diagnosis:   Principal Problem:   Hyperbilirubinemia Active Problems:   Hypothyroidism   Elevated liver enzymes   History of seizure   Hyponatremia   Hypokalemia   Depression   Anxiety   Discharge Condition: Improved.  Diet recommendation: Low sodium, heart healthy.    Wound care: None.  Code status: Full.   History of Present Illness:   Andrew Patton is a 60 y.o. male with past medical history history of psoriatic arthritis, cirrhosis of liver due to alcohol abuse, hypertension and hyperlipidemia presented to the hospital with nausea, vomiting, abdominal pain and distention.  Patient stated that he drinks weekly nowadays instead of daily and had been eating in the outside restaurants.  In the ED, patient was noted to be tachycardic.  Labs showed hyponatremia and hypokalemia with elevated AST and ALT.  Bilirubin was elevated at 16.1 from his usual baseline of around 3.  INR was elevated at 1.7.  CT scan of the abdomen and pelvis showed distended gallbladder without stones or biliary dilatation but cirrhosis of liver with small ascites.  Patient was then admitted hospital for further evaluation and treatment.  Hospital Course:   Following conditions were addressed during hospitalization as listed below,  Decompensated cirrhosis of liver with hyperbilirubinemia/elevated LFT, -not on transplant list due to ongoing ETOH use.  Patient was extensively counseled about the need for alcohol cessation.  Significant bilirubin  elevation on presentation with elevated INR.  Trending down bilirubin during hospitalization.  No biliary obstruction seen on CT abdomen.  Patient normally follows with [facility], Dr. Barron Alvine.   Right upper quadrant ultrasound showed gallbladder wall thickening with no gallstones, cirrhotic liver with small volume abdominal ascites.  Empirically Rocephin was started for SBP prophylaxis due to abdominal pain.  This will be discontinued on discharge.  Chest x-ray without any infiltrate.  ammonia was elevated at 107.  Patient has been started on spironolactone starting 09/04/2020.  This will be continued on discharge.  Lactulose will be prescribed with aim of 2-3 bowel movements a day.  Spoke with the patient as well as patient's spouse at bedside regarding the need for complete cessation of alcohol.    Hypokalemia Patient was given potassium supplements during hospitalization.  We will need to monitor BMP as outpatient.  Patient will be on spironolactone and Lasix will be on hold on discharge.   Hyponatremia Latest sodium of 131, likely secondary to alcoholic liver disease.  As per GI, recommendation is to start Lasix when sodium levels improved as outpatient.  Patient will have an appointment scheduled with GI for follow-up.  hypomagnesemia Replenished during hospitalization.  Patient was 1.7 prior to discharge.  Will be given magnesium oxide on discharge.   Hypophosphatemia.  Replenished during hospitalization.Marland Kitchen   History of seizure Continue  Keppra and Klonopin from home    Hypothyroidism Continue Synthroid.   Mild anemia.  Likely anemia of chronic disease.  Will need outpatient monitoring of CBC.   Type 2 diabetes Patient was on metformin which has recently been taken off by the patient's PCP.  Will  need outpatient monitoring.  Not on oral hypoglycemic agents at home.  Depression/anxiety Continue BuSpar, Lexapro and Klonopin from home.  Disposition.  At this time, patient is stable for  disposition home with outpatient PCP and GI follow-up.  Spoke with the patient's spouse at bedside regarding the need for complete alcohol cessation.  Medical Consultants:   GI Procedures:    None Subjective:   Today, patient was seen and examined at bedside.  Continues to feel better.  Denies any nausea, vomiting fever or chills.  Has mild abdominal discomfort  Discharge Exam:   Vitals:   07/05/21 2112 07/06/21 0518  BP: 104/67 100/62  Pulse: 86 87  Resp: 20 20  Temp: 98.2 F (36.8 C) 98.1 F (36.7 C)  SpO2: 95% 95%   Vitals:   07/04/21 2133 07/05/21 0605 07/05/21 2112 07/06/21 0518  BP: 103/60 (!) 102/59 104/67 100/62  Pulse: 88 83 86 87  Resp: 16 16 20 20   Temp: 97.8 F (36.6 C) 97.9 F (36.6 C) 98.2 F (36.8 C) 98.1 F (36.7 C)  TempSrc: Oral Oral Oral Oral  SpO2: 94% 92% 95% 95%  Weight:      Height:       General: Alert awake, not in obvious distress HENT: pupils equally reacting to light, scleral icterus noted.. Oral mucosa is moist.  Chest:  Clear breath sounds.  Diminished breath sounds bilaterally. No crackles or wheezes.  CVS: S1 &S2 heard. No murmur.  Regular rate and rhythm. Abdomen: Soft, nontender, mildly distended abdomen with umbilical hernia, bowel sounds are heard.   Extremities: No cyanosis, clubbing or edema.  Peripheral pulses are palpable. Psych: Alert, awake and oriented, normal mood CNS:  No cranial nerve deficits.  Power equal in all extremities.   Skin: Warm and dry.   The results of significant diagnostics from this hospitalization (including imaging, microbiology, ancillary and laboratory) are listed below for reference.     Diagnostic Studies:   CT Abdomen Pelvis W Contrast  Result Date: 07/02/2021 CLINICAL DATA:  Epigastric pain with nausea vomiting and diarrhea EXAM: CT ABDOMEN AND PELVIS WITH CONTRAST TECHNIQUE: Multidetector CT imaging of the abdomen and pelvis was performed using the standard protocol following bolus  administration of intravenous contrast. CONTRAST:  4mL OMNIPAQUE IOHEXOL 350 MG/ML SOLN COMPARISON:  CT 03/07/2019 FINDINGS: Lower chest: Lung bases demonstrate no acute consolidation or pleural effusion. Borderline cardiomegaly. Hepatobiliary: Liver cirrhosis. Diffuse micro nodularity of the liver without dominant hepatic mass. Distended gallbladder without calcified stone. No biliary dilatation. Pancreas: Unremarkable. No pancreatic ductal dilatation or surrounding inflammatory changes. Spleen: Mildly enlarged. Adrenals/Urinary Tract: Adrenal glands are unremarkable. Kidneys are normal, without renal calculi, focal lesion, or hydronephrosis. Bladder is unremarkable. Stomach/Bowel: Stomach nonenlarged. No dilated small bowel. Prominent submucosal fat deposition at the colon with collapsed appearance. No definite colon inflammatory process. Negative appendix. Diverticular disease of left colon. Vascular/Lymphatic: Recanalized paraumbilical vein. Nonaneurysmal aorta with atherosclerosis. No suspicious nodes. Reproductive: Prostate is unremarkable. Other: Negative for free air. Small amount of ascites within the abdomen and pelvis. Small fat and fluid containing umbilical hernia Musculoskeletal: No acute or significant osseous findings. IMPRESSION: 1. Liver cirrhosis with evidence of portal hypertension. Small volume of abdominopelvic ascites. Diffuse micronodularity of the liver parenchyma without dominant mass, When the patient is clinically stable and able to follow directions and hold their breath (preferably as an outpatient) further evaluation with dedicated abdominal MRI should be considered. 2. Dilated gallbladder without calcified stone. Correlation with right upper quadrant ultrasound as indicated 3.  Negative for bowel obstruction. Favor collapsed appearance and prominent submucosal fat deposition of the colon as opposed to colitis type changes. 4. Left colon diverticular disease without acute inflammation.  Electronically Signed   By: Donavan Foil M.D.   On: 07/02/2021 17:57   DG CHEST PORT 1 VIEW  Result Date: 07/03/2021 CLINICAL DATA:  Abdominal pain EXAM: PORTABLE CHEST 1 VIEW COMPARISON:  Chest x-ray dated April 06, 2017 FINDINGS: Cardiac and mediastinal contours are unchanged. No evidence of pleural effusion or pneumothorax. Mild left basilar opacities, likely due to atelectasis. IMPRESSION: No active disease. Electronically Signed   By: Yetta Glassman M.D.   On: 07/03/2021 10:25   US Abdomen Limited RUQ (LIVER/GB)  Result Date: 07/03/2021 CLINICAL DATA:  Distended gallbladder EXAM: ULTRASOUND ABDOMEN LIMITED RIGHT UPPER QUADRANT COMPARISON:  CT abdomen and pelvis dated July 02, 2021 FINDINGS: Gallbladder: Gallbladder wall thickening, measuring up to 7 mm. No gallstones. No sonographic Murphy sign noted by sonographer. Common bile duct: Diameter: 5 mm Liver: No focal lesion identified. Nodular liver contour. Increased parenchymal echogenicity. Portal vein is patent on color Doppler imaging with normal direction of blood flow towards the liver. Other: Small volume ascites. IMPRESSION: 1. Gallbladder wall thickening with no gallstones and negative sonographic Murphy sign, gallbladder wall thickening may be reactive given presence of ascites. 2. Cirrhotic liver morphology and small volume abdominal ascites. Electronically Signed   By: Yetta Glassman M.D.   On: 07/03/2021 10:28     Labs:   Basic Metabolic Panel: Recent Labs  Lab 07/02/21 2340 07/03/21 0036 07/03/21 0500 07/04/21 0629 07/05/21 0542 07/06/21 0455  NA 128* 126* 127* 129* 132* 131*  K 2.4* 2.4* 2.8* 3.0* 2.8* 3.1*  CL 86* 84* 88* 92* 90* 93*  CO2 30 30 30 31  32 32  GLUCOSE 96 97 91 103* 98 101*  BUN 9 9 9 8 6  <5*  CREATININE 1.01 1.04 0.96 0.97 0.98 0.92  CALCIUM 6.7* 6.8* 6.7* 7.4* 7.9* 7.7*  MG 1.1*  --   --  1.7 1.5* 1.7  PHOS  --   --   --  2.1*  --  3.6   GFR Estimated Creatinine Clearance: 81.3 mL/min  (by C-G formula based on SCr of 0.92 mg/dL). Liver Function Tests: Recent Labs  Lab 07/02/21 1539 07/02/21 2340 07/03/21 0500 07/04/21 0629 07/05/21 0542 07/06/21 0455  AST 137*  --  106* 104* 115* 116*  ALT 48*  --  36 37 40 39  ALKPHOS 130*  --  96 101 101 90  BILITOT 16.1* 14.5* 12.9* 12.3* 11.1* 10.8*  PROT 7.1  --  5.4* 5.6* 5.5* 5.4*  ALBUMIN 3.5  --  2.7* 2.7* 2.7* 2.6*   Recent Labs  Lab 07/02/21 1539  LIPASE 45   Recent Labs  Lab 07/03/21 1225 07/05/21 0542  AMMONIA 107* 78*   Coagulation profile Recent Labs  Lab 07/02/21 1702 07/03/21 1225 07/06/21 0455  INR 1.7* 2.0* 2.0*    CBC: Recent Labs  Lab 07/02/21 1539 07/04/21 0629 07/05/21 0542 07/06/21 0455  WBC 7.6 5.5 5.2 5.5  NEUTROABS 4.9  --   --   --   HGB 12.1* 10.2* 9.9* 9.5*  HCT 32.3* 27.4* 26.9* 25.5*  MCV 94.7 97.2 97.8 97.3  PLT 135* 125* 136* 127*   Cardiac Enzymes: No results for input(s): CKTOTAL, CKMB, CKMBINDEX, TROPONINI in the last 168 hours. BNP: Invalid input(s): POCBNP CBG: Recent Labs  Lab 07/05/21 1151 07/05/21 1609 07/05/21 2114 07/06/21 0751 07/06/21 1145  GLUCAP 145* 98 126* 98 103*   D-Dimer No results for input(s): DDIMER in the last 72 hours. Hgb A1c No results for input(s): HGBA1C in the last 72 hours. Lipid Profile No results for input(s): CHOL, HDL, LDLCALC, TRIG, CHOLHDL, LDLDIRECT in the last 72 hours. Thyroid function studies No results for input(s): TSH, T4TOTAL, T3FREE, THYROIDAB in the last 72 hours.  Invalid input(s): FREET3 Anemia work up No results for input(s): VITAMINB12, FOLATE, FERRITIN, TIBC, IRON, RETICCTPCT in the last 72 hours. Microbiology Recent Results (from the past 240 hour(s))  Resp Panel by RT-PCR (Flu A&B, Covid) Nasopharyngeal Swab     Status: None   Collection Time: 07/02/21  5:02 PM   Specimen: Nasopharyngeal Swab; Nasopharyngeal(NP) swabs in vial transport medium  Result Value Ref Range Status   SARS Coronavirus 2 by  RT PCR NEGATIVE NEGATIVE Final    Comment: (NOTE) SARS-CoV-2 target nucleic acids are NOT DETECTED.  The SARS-CoV-2 RNA is generally detectable in upper respiratory specimens during the acute phase of infection. The lowest concentration of SARS-CoV-2 viral copies this assay can detect is 138 copies/mL. A negative result does not preclude SARS-Cov-2 infection and should not be used as the sole basis for treatment or other patient management decisions. A negative result may occur with  improper specimen collection/handling, submission of specimen other than nasopharyngeal swab, presence of viral mutation(s) within the areas targeted by this assay, and inadequate number of viral copies(<138 copies/mL). A negative result must be combined with clinical observations, patient history, and epidemiological information. The expected result is Negative.  Fact Sheet for Patients:  EntrepreneurPulse.com.au  Fact Sheet for Healthcare Providers:  IncredibleEmployment.be  This test is no t yet approved or cleared by the [place] FDA and  has been authorized for detection and/or diagnosis of SARS-CoV-2 by FDA under an Emergency Use Authorization (EUA). This EUA will remain  in effect (meaning this test can be used) for the duration of the COVID-19 declaration under Section 564(b)(1) of the Act, 21 U.S.C.section 360bbb-3(b)(1), unless the authorization is terminated  or revoked sooner.       Influenza A by PCR NEGATIVE NEGATIVE Final   Influenza B by PCR NEGATIVE NEGATIVE Final    Comment: (NOTE) The Xpert Xpress SARS-CoV-2/FLU/RSV plus assay is intended as an aid in the diagnosis of influenza from Nasopharyngeal swab specimens and should not be used as a sole basis for treatment. Nasal washings and aspirates are unacceptable for Xpert Xpress SARS-CoV-2/FLU/RSV testing.  Fact Sheet for Patients: EntrepreneurPulse.com.au  Fact Sheet  for Healthcare Providers: IncredibleEmployment.be  This test is not yet approved or cleared by the [place] FDA and has been authorized for detection and/or diagnosis of SARS-CoV-2 by FDA under an Emergency Use Authorization (EUA). This EUA will remain in effect (meaning this test can be used) for the duration of the COVID-19 declaration under Section 564(b)(1) of the Act, 21 U.S.C. section 360bbb-3(b)(1), unless the authorization is terminated or revoked.  Performed at [facility], [place] [address], [place], [place] [address]      Discharge Instructions:   Discharge Instructions     Call MD for:  persistant nausea and vomiting   Complete by: As directed    Call MD for:  severe uncontrolled pain   Complete by: As directed    Call MD for:  temperature >100.4   Complete by: As directed    Diet - low sodium heart healthy   Complete by: As directed    Discharge instructions  Complete by: As directed    Follow-up with your primary care provider in 1 week.  Follow-up with [facility] in 3 to 4 weeks.  Low-salt diet.  Do not take your Lasix until seen by GI doctor as outpatient.  Check blood work in the next visit.  Please consider complete alcohol abstinence.   Increase activity slowly   Complete by: As directed       Allergies as of 07/06/2021       Reactions   Crestor [rosuvastatin Calcium] Other (See Comments)   Elevated liver enzymes   Meloxicam Other (See Comments)   Leg pain   Rosuvastatin Other (See Comments)   Elevated liver enzymes   Penicillins Rash   Has patient had a PCN reaction causing immediate rash, facial/tongue/throat swelling, SOB or lightheadedness with hypotension: No Has patient had a PCN reaction causing severe rash involving mucus membranes or skin necrosis: No Has patient had a PCN reaction that required hospitalization: No Has patient had a PCN reaction occurring within the last 10 years: No If all of the  above answers are "NO", then may proceed with Cephalosporin use. Other reaction(s): rash        Medication List     STOP taking these medications    vitamin E 1000 UNIT capsule       TAKE these medications    azelastine 0.1 % nasal spray Commonly known as: ASTELIN Place 1 spray into both nostrils 2 (two) times daily. Use in each nostril as directed   busPIRone 15 MG tablet Commonly known as: BUSPAR Take 15-30 mg by mouth See admin instructions. Take 15 mg by mouth in the morning and 22.5-30 mg after supper   clonazePAM 0.5 MG tablet Commonly known as: KLONOPIN Take 0.5 mg by mouth See admin instructions. Take 0.5 mg by mouth at bedtime and an additional 0.5 mg once a day as needed for palpitations   colesevelam 625 MG tablet Commonly known as: WELCHOL Take 625 mg by mouth daily with breakfast.   escitalopram 10 MG tablet Commonly known as: LEXAPRO Take 5 mg by mouth daily.   ezetimibe 10 MG tablet Commonly known as: ZETIA Take 10 mg by mouth daily.   fluticasone 50 MCG/ACT nasal spray Commonly known as: FLONASE Place 1 spray into both nostrils at bedtime.   folic acid 1 MG tablet Commonly known as: FOLVITE Take 1 tablet (1 mg total) by mouth daily. Start taking on: July 07, 2021   furosemide 40 MG tablet Commonly known as: LASIX Take 1 tablet (40 mg total) by mouth daily. Start taking on: July 27, 2021 What changed: These instructions start on July 27, 2021. If you are unsure what to do until then, ask your doctor or other care provider.   ipratropium 0.06 % nasal spray Commonly known as: ATROVENT Place 1 spray into the nose daily.   Krill Oil 500 MG Caps Take 500 mg by mouth daily.   lactulose 10 GM/15ML solution Commonly known as: CHRONULAC Take 15 mLs (10 g total) by mouth 2 (two) times daily as needed for mild constipation (for bowel movments 2-3 per day).   levETIRAcetam 500 MG 24 hr tablet Commonly known as: Keppra XR Take 2  tablets (1,000 mg total) by mouth daily. What changed: when to take this   levocetirizine 5 MG tablet Commonly known as: XYZAL Take 5 mg by mouth every evening.   levothyroxine 75 MCG tablet Commonly known as: SYNTHROID Take 75 mcg by mouth daily before  breakfast.   magnesium oxide 400 (240 Mg) MG tablet Commonly known as: MAG-OX Take 1 tablet (400 mg total) by mouth 2 (two) times daily for 10 days.   multivitamin with minerals Tabs tablet Take 1 tablet by mouth daily. Start taking on: July 07, 2021   mupirocin cream 2 % Commonly known as: Bactroban Apply 1 application topically 2 (two) times daily. Use BID , 1 week PRN for nasal crusting or scabbing What changed:  when to take this reasons to take this additional instructions   ondansetron 4 MG disintegrating tablet Commonly known as: Zofran ODT Take 1 tablet (4 mg total) by mouth every 8 (eight) hours as needed for nausea or vomiting. What changed: reasons to take this   PRILOSEC OTC PO Take 1 tablet by mouth daily.   sildenafil 20 MG tablet Commonly known as: REVATIO Take 1 tablet by mouth as needed.   spironolactone 100 MG tablet Commonly known as: ALDACTONE Take 1 tablet (100 mg total) by mouth daily.   TALTZ [place] Inject into the skin every 14 (fourteen) days.   thiamine 100 MG tablet Take 1 tablet (100 mg total) by mouth daily.   Vitamin D3 25 MCG (1000 UT) Caps Take 1 capsule by mouth daily.   zolpidem 5 MG tablet Commonly known as: AMBIEN Take 5 mg by mouth at bedtime.        Follow-up Information     Deland Pretty, MD Follow up in 1 week(s).   Specialty: Internal Medicine Contact information: [address] [place] [address] [phone]         [name] V, DO Follow up in 2 week(s).   Specialty: Gastroenterology Why: COL followup, lasix decision Contact information: [place]  [id number] [phone]                   Time coordinating discharge: 39 minutes  Signed:  Heyden Jaber  [facility] 07/06/2021, 12:18 PM

## 2021-07-06 NOTE — Telephone Encounter (Signed)
Patient has been scheduled for a 4-week hospital follow up with Dr. Barron Alvine on Wednesday, 08/05/21 at 1:40 pm.  Lm on vm for patient to return call to discuss appt information.

## 2021-07-06 NOTE — Telephone Encounter (Signed)
-----   Message from [facility] [place], [place] sent at 07/06/2021 10:07 AM EST ----- Regarding: follow up Patient needs follow up- ideally with Dr. Barron Alvine in 3-4 weeks- if not him then one of [name] [employer]... doesn't matter who- Gunnar Fusi had most contact with him.  Thanks-JLL

## 2021-07-06 NOTE — Progress Notes (Addendum)
    Progress Note   Subjective  Chief Complaint: Decompensated alcoholic cirrhosis with ascites and hypokalemia/hyponatremia  Today, the patient tells me that he feels quite well as "everyone let me sleep last night".  Does report 3 loose bowel movements yesterday evening around 7:00 PM since starting the Lactulose.  Tells me that his abdomen continues to feel distended and hardened, but this is better than when he came in.  His wife is in the room and does ask some questions in regards to medications going forward.  Denies any new complaints or concerns.  Does tell me that they are planning on going on a cruise in early January and they wanted to make sure this was okay.   Objective   Vital signs in last 24 hours: Temp:  [98.1 F (36.7 C)-98.2 F (36.8 C)] 98.1 F (36.7 C) (12/05 0518) Pulse Rate:  [86-87] 87 (12/05 0518) Resp:  [20] 20 (12/05 0518) BP: (100-104)/(62-67) 100/62 (12/05 0518) SpO2:  [95 %] 95 % (12/05 0518) Last BM Date: 07/05/21 General:    Chronically ill-appearing white male in NAD with icterus Heart:  Regular rate and rhythm; no murmurs Lungs: Respirations even and unlabored, lungs CTA bilaterally Abdomen:  Soft, nontender and moderate distention. Normal bowel sounds. Extremities:  Without edema. Psych:  Cooperative. Normal mood and affect.  Intake/Output from previous day: 12/04 0701 - 12/05 0700 In: 1150.9 [P.O.:960; IV Piggyback:190.9] Out: -    Lab Results: Recent Labs    07/04/21 0629 07/05/21 0542 07/06/21 0455  WBC 5.5 5.2 5.5  HGB 10.2* 9.9* 9.5*  HCT 27.4* 26.9* 25.5*  PLT 125* 136* 127*   BMET Recent Labs    07/04/21 0629 07/05/21 0542 07/06/21 0455  NA 129* 132* 131*  K 3.0* 2.8* 3.1*  CL 92* 90* 93*  CO2 31 32 32  GLUCOSE 103* 98 101*  BUN 8 6 <5*  CREATININE 0.97 0.98 0.92  CALCIUM 7.4* 7.9* 7.7*   LFT Recent Labs    07/06/21 0455  PROT 5.4*  ALBUMIN 2.6*  AST 116*  ALT 39  ALKPHOS 90  BILITOT 10.8*    PT/INR Recent Labs    07/03/21 1225 07/06/21 0455  LABPROT 22.9* 22.9*  INR 2.0* 2.0*    Assessment / Plan:   Assessment: 1.  Decompensated alcoholic cirrhosis: With ascites, hypokalemia and hyponatremia 2.  Anemia: Hemoglobin 10.2--> 9.5, no report of bloody stools  Plan: 1.  Continue to correct potassium 2.  2 g sodium diet 3.  Continue Spironolactone 100 mg daily 4.  Again Lasix can be added once his sodium improves-this can be done as an outpatient 5.  Would continue Lactulose as an outpatient.  Discussed that he should aim for 2-3 bowel movements a day. 6.  Complete alcohol abstinence recommended. 7.  We will need to repeat EGD likely in the next 6 months as an outpatient 8.  I will arrange for patient to follow in clinic in the next 3 to 4 weeks  Thank you for your kind consultation.   LOS: 4 days   Unk Lightning  07/06/2021, 9:15 AM   Patient was dced before I was able to round. Agree with above except would anticipate scheduling EGD sooner than 6 months - will notify Dr. Barron Alvine who may reach out and arrange vs waiting to do at F/U in early January.  Iva Boop, MD, [facility] 07/06/2021 3:43 PM

## 2021-07-07 NOTE — Telephone Encounter (Signed)
Received a return call from patient's wife [name]. We have reviewed patient's appt information and recommendations from Dr. Barron Alvine. Pt's wife wanted to know when he should resume his Lasix. Advised that based on discharge instructions pt is to have repeat labs and if sodium had improved the Lasix would be resumed. Pt's wife states that he will be getting labs for his PCP on 12/9 and plans to follow up with PCP on 12/15. Advised that patient will need to keep follow up appt with Dr. Barron Alvine as scheduled. She is aware that his appt is at the [facility] office. Pt's wife verbalized understanding of all information and had no concerns at the end of the call.

## 2021-07-07 NOTE — Telephone Encounter (Signed)
Additional note:  Cirigliano, Verlin Dike, DO  Iva Boop, MD; Missy Sabins, RN Thank you for the update on Andrew Patton.  I completely agree with you that going on a cruise would be risky for this patient on multiple levels.   Andrew Patton, can you please reach out to this patient to follow-up with him after recent hospital discharge.  Can you please let him know that I agree with Dr. Leone Payor and advised against going on a cruise.  To follow-up with me in early January as scheduled.        Previous Messages   ----- Message -----  From: Iva Boop, MD  Sent: 07/06/2021   3:48 PM EST  To: Unk Lightning, PA, *  Subject: [name],   This man was in with problems after gastroenteritis - I did not see him but signed note today.   He has f/u you in early Jan   ? If you want to go ahead and set up an EGD appt now vs waiting to do at Jan f/u - 2020 no varices but has decompensated some so needs one again   He is drinking again also   When you see him he may ask about going on a cruise in Jan - I did not get a chance to address but would have advised cancel and hope you have insurance   Baldo Ash

## 2021-07-09 ENCOUNTER — Encounter: Payer: Self-pay | Admitting: Gastroenterology

## 2021-07-10 DIAGNOSIS — E041 Nontoxic single thyroid nodule: Secondary | ICD-10-CM | POA: Diagnosis not present

## 2021-07-10 DIAGNOSIS — E1165 Type 2 diabetes mellitus with hyperglycemia: Secondary | ICD-10-CM | POA: Diagnosis not present

## 2021-07-10 DIAGNOSIS — E7801 Familial hypercholesterolemia: Secondary | ICD-10-CM | POA: Diagnosis not present

## 2021-07-10 DIAGNOSIS — Z125 Encounter for screening for malignant neoplasm of prostate: Secondary | ICD-10-CM | POA: Diagnosis not present

## 2021-07-10 DIAGNOSIS — R7989 Other specified abnormal findings of blood chemistry: Secondary | ICD-10-CM | POA: Diagnosis not present

## 2021-07-13 DIAGNOSIS — E1165 Type 2 diabetes mellitus with hyperglycemia: Secondary | ICD-10-CM | POA: Diagnosis not present

## 2021-07-13 DIAGNOSIS — R7989 Other specified abnormal findings of blood chemistry: Secondary | ICD-10-CM | POA: Diagnosis not present

## 2021-07-13 DIAGNOSIS — E7801 Familial hypercholesterolemia: Secondary | ICD-10-CM | POA: Diagnosis not present

## 2021-07-13 DIAGNOSIS — E041 Nontoxic single thyroid nodule: Secondary | ICD-10-CM | POA: Diagnosis not present

## 2021-07-16 DIAGNOSIS — Z0000 Encounter for general adult medical examination without abnormal findings: Secondary | ICD-10-CM | POA: Diagnosis not present

## 2021-07-16 DIAGNOSIS — I10 Essential (primary) hypertension: Secondary | ICD-10-CM | POA: Diagnosis not present

## 2021-07-16 DIAGNOSIS — E1169 Type 2 diabetes mellitus with other specified complication: Secondary | ICD-10-CM | POA: Diagnosis not present

## 2021-07-16 DIAGNOSIS — E039 Hypothyroidism, unspecified: Secondary | ICD-10-CM | POA: Diagnosis not present

## 2021-07-16 DIAGNOSIS — K7031 Alcoholic cirrhosis of liver with ascites: Secondary | ICD-10-CM | POA: Diagnosis not present

## 2021-07-23 DIAGNOSIS — K766 Portal hypertension: Secondary | ICD-10-CM | POA: Diagnosis not present

## 2021-07-23 DIAGNOSIS — K7011 Alcoholic hepatitis with ascites: Secondary | ICD-10-CM | POA: Diagnosis not present

## 2021-07-23 DIAGNOSIS — K429 Umbilical hernia without obstruction or gangrene: Secondary | ICD-10-CM | POA: Diagnosis not present

## 2021-07-23 DIAGNOSIS — K7290 Hepatic failure, unspecified without coma: Secondary | ICD-10-CM | POA: Diagnosis not present

## 2021-07-28 DIAGNOSIS — E039 Hypothyroidism, unspecified: Secondary | ICD-10-CM | POA: Diagnosis not present

## 2021-08-05 ENCOUNTER — Ambulatory Visit (INDEPENDENT_AMBULATORY_CARE_PROVIDER_SITE_OTHER): Payer: BC Managed Care – PPO | Admitting: Gastroenterology

## 2021-08-05 ENCOUNTER — Other Ambulatory Visit: Payer: Self-pay

## 2021-08-05 ENCOUNTER — Encounter: Payer: Self-pay | Admitting: Gastroenterology

## 2021-08-05 ENCOUNTER — Ambulatory Visit (HOSPITAL_BASED_OUTPATIENT_CLINIC_OR_DEPARTMENT_OTHER)
Admission: RE | Admit: 2021-08-05 | Discharge: 2021-08-05 | Disposition: A | Discharge status: Home / Self Care | Payer: BC Managed Care – PPO | Source: Ambulatory Visit | Attending: Gastroenterology | Admitting: Gastroenterology

## 2021-08-05 VITALS — BP 110/66 | HR 78 | Ht 66.5 in | Wt 171.1 lb

## 2021-08-05 DIAGNOSIS — K7031 Alcoholic cirrhosis of liver with ascites: Secondary | ICD-10-CM | POA: Diagnosis not present

## 2021-08-05 DIAGNOSIS — Z87898 Personal history of other specified conditions: Secondary | ICD-10-CM

## 2021-08-05 DIAGNOSIS — K3189 Other diseases of stomach and duodenum: Secondary | ICD-10-CM

## 2021-08-05 DIAGNOSIS — R1310 Dysphagia, unspecified: Secondary | ICD-10-CM | POA: Diagnosis not present

## 2021-08-05 DIAGNOSIS — K7682 Hepatic encephalopathy: Secondary | ICD-10-CM | POA: Diagnosis not present

## 2021-08-05 DIAGNOSIS — K766 Portal hypertension: Secondary | ICD-10-CM

## 2021-08-05 DIAGNOSIS — R17 Unspecified jaundice: Secondary | ICD-10-CM | POA: Diagnosis not present

## 2021-08-05 DIAGNOSIS — R112 Nausea with vomiting, unspecified: Secondary | ICD-10-CM | POA: Diagnosis not present

## 2021-08-05 MED ORDER — FUROSEMIDE 20 MG PO TABS: 20.0000 mg | ORAL_TABLET | Freq: Every day | ORAL | 3 refills | Status: DC

## 2021-08-05 MED ORDER — RIFAXIMIN 550 MG PO TABS: 550.0000 mg | ORAL_TABLET | Freq: Two times a day (BID) | ORAL | 11 refills | Status: DC

## 2021-08-05 NOTE — Progress Notes (Signed)
Chief Complaint:    Hospital follow-up, cirrhosis, nausea/vomiting  GI History: 61 year old male with a history of psoriatic arthritis (previously treated with Enbrel, now Cosentyx), hypertension, hyperlipidemia, and history of alcohol abuse,  diagnosed with cirrhosis in 03/2019 on hospital admission (see prior notes for summary of hospitalization work-up).    Cirrhosis Evaluation: - Etiology: Steatohepatitis; 2/2 EtOH, but also RFs for NASH to include hypertension, hyperlipidemia, central adiposity/history of obesity   - Complications: Ascites, portal hypertensive gastropathy, hepatic encephalopathy - HCC screening: RUQ US 07/2021: Cirrhosis, no HCC.  AFP normal 08/2020 - Variceal screening: EGD 04/2019 (no varices) - Serologic evaluation: Completed 03/2019-all negative - Viral hepatitis vaccination: UTD; HAVAb+, HBsAb+ - Flu vaccine: Declines - Liver biopsy: None to date - Medications: Lasix 40 mg, Aldactone 100 mg, lactulose (not taking) - MELD: 26 - Child Pugh score: C (11 points)     Endoscopic history: - Colonoscopy (01/2013, Dr. Jarold MottoPatterson): Normal.  Repeat in 10 years -EGD (04/2019, Dr. Barron Alvineirigliano): Normal esophagus, moderate PHG, normal duodenum  HPI:     Patient is a 61 y.o. male presenting [facility] for hospital follow-up.  Admitted 12/1-5, 2022 with hyponatremia, hypokalemia, elevated T bili (14.5), distended abdomen, nausea/vomiting/diarrhea.  Had stopped taking diuretics 1 week prior to admission due to concerns of dehydration with his symptoms.  Did admit to drinking prosseco 1-2 glasses on most nights.  Inpatient evaluation notable for the following: - CT abdomen/pelvis: Cirrhosis with portal hypertension, small volume ascites, dilated GB without gallstones - RUQ US: GB wall thickening without gallstones favored to be reactive thickening.  Negative Murphy sign.  Normal CBD.  Small volume ascites - Electrolytes improving/stable and T bili was downtrending,  10.8 at time of discharge.  Elevated AST: ALT ratio c/w EtOH - Was discharged with spironolactone, lactulose, low-sodium diet, with plan to restart Lasix as outpatient once hyponatremia allows. -07/06/2021: MELD 26.  GI service recommended repeat outpatient EGD to evaluate for esophageal varices given decompensation - 07/16/2021: Outpatient labs notable for ammonia 69, H/H11.7/36.4, PLT 186, albumin 2.9, AST/ALT 140/55, T bili 11.5, ALP 131, BUN/creatinine 6/1.21, sodium 137, potassium 3.5   Was seen and followed by his PCM, Dr. Renne CriglerPharr on 07/28/2021.  Today, his main complaint is the umbilical hernia. He was seen in [facility] by Dr. Dossie DerStechschulte, with no plan for elective surgery until liver improves.   Does have loose stools, which he atteibutes to the Lactulose. Will use periodically due to diarrhea ADR. Also with post tussive nausea/vomiting for last couple of weeks. No n/v outside of coughing.   Does c/o intermittent solid food dysphagia, pointing to mid chest.  No issue tolerating liquids, protein shakes, and yogurt.  No reflux symptoms.  Reports complete abstinence of all EtOH since hospital discharge.  Review of systems:     No chest pain, no SOB, no fevers, no urinary sx   Past Medical History:  Diagnosis Date   Allergy    Anxiety    Arthritis    Ascites    Diabetes (HCC)    Elevated transaminase level    Fatigue    Fatty liver    Hyperlipidemia    Hypertension    "under control", not on any blood pressure medication, has lost 60 lbs   Hypothyroidism    on synthroid   Inguinal muscle strain    Insomnia    Left knee pain    "long time ago"   Multinodular goiter    Obesity    Pneumonia  Psoriasis    Tinnitus     Patient's surgical history, family medical history, social history, medications and allergies were all reviewed in Epic    Current Outpatient Medications  Medication Sig Dispense Refill   azelastine (ASTELIN) 0.1 % nasal spray Place 1 spray into both  nostrils 2 (two) times daily. Use in each nostril as directed     busPIRone (BUSPAR) 15 MG tablet Take 15-30 mg by mouth See admin instructions. Take 15 mg by mouth in the morning and 22.5-30 mg after supper     Cholecalciferol (VITAMIN D3) 1000 UNITS CAPS Take 1 capsule by mouth daily.     clonazePAM (KLONOPIN) 0.5 MG tablet Take 0.5 mg by mouth See admin instructions. Take 0.5 mg by mouth at bedtime and an additional 0.5 mg once a day as needed for palpitations     colesevelam (WELCHOL) 625 MG tablet Take 625 mg by mouth daily with breakfast.     escitalopram (LEXAPRO) 10 MG tablet Take 5 mg by mouth daily.     ezetimibe (ZETIA) 10 MG tablet Take 10 mg by mouth daily.     fluticasone (FLONASE) 50 MCG/ACT nasal spray Place 1 spray into both nostrils at bedtime.     folic acid (FOLVITE) 1 MG tablet Take 1 tablet (1 mg total) by mouth daily. 100 tablet 0   ipratropium (ATROVENT) 0.06 % nasal spray Place 1 spray into the nose daily.     Ixekizumab (TALTZ [place]) Inject into the skin every 14 (fourteen) days.     Krill Oil 500 MG CAPS Take 500 mg by mouth daily.     lactulose (CHRONULAC) 10 GM/15ML solution Take 15 mLs (10 g total) by mouth 2 (two) times daily as needed for mild constipation (for bowel movments 2-3 per day).     levETIRAcetam (KEPPRA XR) 500 MG 24 hr tablet Take 2 tablets (1,000 mg total) by mouth daily. (Patient taking differently: Take 1,000 mg by mouth daily after supper.) 180 tablet 3   levocetirizine (XYZAL) 5 MG tablet Take 5 mg by mouth every evening.     levothyroxine (SYNTHROID) 75 MCG tablet Take 75 mcg by mouth daily before breakfast.     Multiple Vitamin (MULTIVITAMIN WITH MINERALS) TABS tablet Take 1 tablet by mouth daily.     mupirocin cream (BACTROBAN) 2 % Apply 1 application topically 2 (two) times daily. Use BID , 1 week PRN for nasal crusting or scabbing (Patient taking differently: Apply 1 application topically 2 (two) times daily as needed (for nasal crusting or  scabbing).) 15 g 0   Omeprazole Magnesium (PRILOSEC OTC PO) Take 1 tablet by mouth daily.     ondansetron (ZOFRAN ODT) 4 MG disintegrating tablet Take 1 tablet (4 mg total) by mouth every 8 (eight) hours as needed for nausea or vomiting. (Patient taking differently: Take 4 mg by mouth every 8 (eight) hours as needed for nausea or vomiting (dissolve orally).) 6 tablet 0   ondansetron (ZOFRAN) 4 MG tablet Take 4 mg by mouth every 8 (eight) hours as needed.     sildenafil (REVATIO) 20 MG tablet Take 1 tablet by mouth as needed.     spironolactone (ALDACTONE) 100 MG tablet Take 1 tablet (100 mg total) by mouth daily. 30 tablet 5   thiamine 100 MG tablet Take 1 tablet (100 mg total) by mouth daily. 90 tablet 0   traMADol (ULTRAM) 50 MG tablet Take 25 mg by mouth 3 (three) times daily as needed.  zolpidem (AMBIEN) 5 MG tablet Take 5 mg by mouth at bedtime.     furosemide (LASIX) 40 MG tablet Take 1 tablet (40 mg total) by mouth daily. (Patient not taking: Reported on 08/05/2021) 30 tablet 3   No current facility-administered medications for this visit.    Physical Exam:     BP 110/66    Pulse 78    Ht 5' 6.5" (1.689 m)    Wt 171 lb 2 oz (77.6 kg)    BMI 27.21 kg/m   GENERAL:  Pleasant male in NAD PSYCH: : Cooperative, normal affect EENT: Icteric sclera  CARDIAC:  RRR, no murmur heard, no peripheral edema PULM: Normal respiratory effort, lungs CTA bilaterally, no wheezing ABDOMEN: Distended but soft, nontender, normal bowel sounds SKIN: Jaundiced complexion  Musculoskeletal: Slight temporal muscle wasting, decreased muscle mass  b/l UE compared with previous exam  NEURO: Alert and oriented x 3, no focal neurologic deficits   IMPRESSION and PLAN:    1) EtOH cirrhosis 2) Ascites 3) Portal hypertensive gastropathy 4) Hepatic encephalopathy 5) Jaundice/Elevated bilirubin  - Continue low-sodium diet - Continue spironolactone - Restarting Lasix 20 mg/day.  Repeat BMP in 7-10 days.  Can  titrate up as needed.  Was previously 40 mg/day (but also was not taking Aldactone regularly at that time) - Start rifaximin 550 mg bid - Continue lactulose - UTD on HCC screening - Repeat EGD for EV screening - Strongly counseled on complete cessation of all EtOH.  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  His wife has also quit drinking as a means to support him - Continue complete abstinence  7) Altered sleep/wake cycles - Suspect due to underlying Hepatic Encephalopathy - Continue lactulose - Starting rifaximin - Recommend follow-up with Neurology  8) Dysphagia - EGD to evaluate for luminal pathology with dilation as appropriate  9) Posttussive emesis - CXR - Evaluate for mucosal/luminal pathology time EGD as above  The indications, risks, and benefits of EGD were explained to the patient in detail. Risks include but are not limited to bleeding, perforation, adverse reaction to medications, and cardiopulmonary compromise. Sequelae include but are not limited to the possibility of surgery, hospitalization, and mortality. The patient verbalized understanding and wished to proceed. All questions answered, referred to scheduler. Further recommendations pending results of the exam.   I spent 45 minutes of time, including in depth chart review, independent review of results as outlined above, communicating results with the patient directly, face-to-face time with the patient, coordinating care, ordering studies and medications as appropriate, and documentation.       Shellia Cleverly ,DO, FACG 08/05/2021, 1:43 PM

## 2021-08-05 NOTE — Patient Instructions (Addendum)
If you are age 61 or older, your body mass index should be between 23-30. Your Body mass index is 27.21 kg/m. If this is out of the aforementioned range listed, please consider follow up with your Primary Care Provider.  If you are age 74 or younger, your body mass index should be between 19-25. Your Body mass index is 27.21 kg/m. If this is out of the aformentioned range listed, please consider follow up with your Primary Care Provider.   __________________________________________________________  The [facility] providers would like to encourage you to use [facility] to communicate with providers for non-urgent requests or questions.  Due to long hold times on the telephone, sending your provider a message by [facility] may be a faster and more efficient way to get a response.  Please allow 48 business hours for a response.  Please remember that this is for non-urgent requests.   Due to recent changes in healthcare laws, you may see the results of your imaging and laboratory studies on MyChart before your provider has had a chance to review them.  We understand that in some cases there may be results that are confusing or concerning to you. Not all laboratory results come back in the same time frame and the provider may be waiting for multiple results in order to interpret others.  Please give [name] 48 hours in order for your provider to thoroughly review all the results before contacting the office for clarification of your results.   You have been scheduled for an endoscopy. Please follow written instructions given to you at your visit today. If you use inhalers (even only as needed), please bring them with you on the day of your procedure.   Please go to Radiology in 1st floor before you leave today.  Please Follow up in April Please call to schedule at [phone].  We have sent the following medications to your pharmacy for you to pick up at your convenience:   Lasix 20mg  - Take once  daily Rifaximin 550 mg - Take twice daily   Thank you for choosing me and [facility].  Vito Cirigliano, D.O.

## 2021-08-06 ENCOUNTER — Telehealth: Payer: Self-pay

## 2021-08-06 ENCOUNTER — Encounter: Payer: Self-pay | Admitting: Gastroenterology

## 2021-08-06 MED ORDER — AZITHROMYCIN 250 MG PO TABS: ORAL_TABLET | ORAL | 0 refills | Status: DC

## 2021-08-06 MED ORDER — CEFPODOXIME PROXETIL 200 MG PO TABS: 200.0000 mg | ORAL_TABLET | Freq: Two times a day (BID) | ORAL | 0 refills | Status: DC

## 2021-08-06 NOTE — Telephone Encounter (Signed)
See chest x-ray result note.

## 2021-08-06 NOTE — Telephone Encounter (Signed)
Discussed test result with the patient's wife Gervon Lardie that the patient CXR demonstrates probable left lower lobe pneumonia. Informed her that following 2 medications has ben sent to the pharmacy:  -cefpodoxime 200 mg twice daily x7 days. - Azithromycin 500 mg on day 1 followed by 250 mg daily x4 days   Informed her to reach out to his PCM to discuss chest x-ray findings and to ensure he is in agreement with antibiotic plan.  Plan for repeat chest x-ray through his PCM in 3-4 weeks.

## 2021-08-07 ENCOUNTER — Telehealth: Payer: Self-pay

## 2021-08-07 NOTE — Telephone Encounter (Signed)
Received PA approval from [facility] for Xifaxan effective from 08/06/2021 through 08/05/2021. Patient Assistant for Andrew Patton is initated since his copay will be $615 for 60 tablets ( 1 month). Spoke with the [place], Patient's wife. Informed her paperwork will be mailed to her to complete the patient's portion and either she can mail or fax to the company. She agreed.

## 2021-08-11 ENCOUNTER — Other Ambulatory Visit: Payer: Self-pay

## 2021-08-11 ENCOUNTER — Encounter: Payer: Self-pay | Admitting: Gastroenterology

## 2021-08-11 ENCOUNTER — Ambulatory Visit (AMBULATORY_SURGERY_CENTER): Payer: BC Managed Care – PPO | Admitting: Gastroenterology

## 2021-08-11 VITALS — BP 108/45 | HR 66 | Temp 98.9°F | Resp 12 | Ht 66.5 in | Wt 171.0 lb

## 2021-08-11 DIAGNOSIS — K2980 Duodenitis without bleeding: Secondary | ICD-10-CM

## 2021-08-11 DIAGNOSIS — K2100 Gastro-esophageal reflux disease with esophagitis, without bleeding: Secondary | ICD-10-CM | POA: Diagnosis not present

## 2021-08-11 DIAGNOSIS — R1310 Dysphagia, unspecified: Secondary | ICD-10-CM

## 2021-08-11 DIAGNOSIS — K766 Portal hypertension: Secondary | ICD-10-CM | POA: Diagnosis not present

## 2021-08-11 DIAGNOSIS — K3189 Other diseases of stomach and duodenum: Secondary | ICD-10-CM

## 2021-08-11 DIAGNOSIS — K7031 Alcoholic cirrhosis of liver with ascites: Secondary | ICD-10-CM

## 2021-08-11 MED ORDER — PANTOPRAZOLE SODIUM 20 MG PO TBEC: 20.0000 mg | DELAYED_RELEASE_TABLET | Freq: Every day | ORAL | 11 refills | Status: DC

## 2021-08-11 MED ORDER — PANTOPRAZOLE SODIUM 20 MG PO TBEC: DELAYED_RELEASE_TABLET | ORAL | 1 refills | Status: DC

## 2021-08-11 MED ORDER — SODIUM CHLORIDE 0.9 % IV SOLN: 500.0000 mL | Freq: Once | INTRAVENOUS | Status: DC

## 2021-08-11 NOTE — Progress Notes (Signed)
A and O x3. Report to RN. Tolerated MAC anesthesia well.Teeth unchanged after procedure. 

## 2021-08-11 NOTE — Patient Instructions (Signed)
Discharge instructions given. Prescription sent to pharmacy. Resume previous medications. YOU HAD AN ENDOSCOPIC PROCEDURE TODAY AT THE [facility]:   Refer to the procedure report that was given to you for any specific questions about what was found during the examination.  If the procedure report does not answer your questions, please call your gastroenterologist to clarify.  If you requested that your care partner not be given the details of your procedure findings, then the procedure report has been included in a sealed envelope for you to review at your convenience later.  YOU SHOULD EXPECT: Some feelings of bloating in the abdomen. Passage of more gas than usual.  Walking can help get rid of the air that was put into your GI tract during the procedure and reduce the bloating. If you had a lower endoscopy (such as a colonoscopy or flexible sigmoidoscopy) you may notice spotting of blood in your stool or on the toilet paper. If you underwent a bowel prep for your procedure, you may not have a normal bowel movement for a few days.  Please Note:  You might notice some irritation and congestion in your nose or some drainage.  This is from the oxygen used during your procedure.  There is no need for concern and it should clear up in a day or so.  SYMPTOMS TO REPORT IMMEDIATELY:  Following upper endoscopy (EGD)  Vomiting of blood or coffee ground material  New chest pain or pain under the shoulder blades  Painful or persistently difficult swallowing  New shortness of breath  Fever of 100F or higher  Black, tarry-looking stools  For urgent or emergent issues, a gastroenterologist can be reached at any hour by calling [phone]. Do not use MyChart messaging for urgent concerns.    DIET:  We do recommend a small meal at first, but then you may proceed to your regular diet.  Drink plenty of fluids but you should avoid alcoholic beverages for 24 hours.  ACTIVITY:  You should plan  to take it easy for the rest of today and you should NOT DRIVE or use heavy machinery until tomorrow (because of the sedation medicines used during the test).    FOLLOW UP: Our staff will call the number listed on your records 48-72 hours following your procedure to check on you and address any questions or concerns that you may have regarding the information given to you following your procedure. If we do not reach you, we will leave a message.  We will attempt to reach you two times.  During this call, we will ask if you have developed any symptoms of COVID 19. If you develop any symptoms (ie: fever, flu-like symptoms, shortness of breath, cough etc.) before then, please call [phone].  If you test positive for Covid 19 in the 2 weeks post procedure, please call and report this information to [name].    If any biopsies were taken you will be contacted by phone or by letter within the next 1-3 weeks.  Please call us at [phone] if you have not heard about the biopsies in 3 weeks.    SIGNATURES/CONFIDENTIALITY: You and/or your care partner have signed paperwork which will be entered into your electronic medical record.  These signatures attest to the fact that that the information above on your After Visit Summary has been reviewed and is understood.  Full responsibility of the confidentiality of this discharge information lies with you and/or your care-partner.

## 2021-08-11 NOTE — Op Note (Signed)
[facility] Patient Name: Andrew Patton Procedure Date: 08/11/2021 9:08 AM MRN: [id number] Endoscopist: Doristine Locks , MD Age: 61 Referring MD:  Date of Birth: Nov 11, 1960 Gender: Male Account #: [id number] Procedure:                Upper GI endoscopy Indications:              Dysphagia, Cirrhosis rule out esophageal varices,                            Nausea with vomiting                           61 yo male with cirrhosis presents for EGD for                            eseophageal varices screening along with evaluation                            of intermittent solid food dysphagia and                            nausea/vomiting. Medicines:                Monitored Anesthesia Care Procedure:                Pre-Anesthesia Assessment:                           - Prior to the procedure, a History and Physical                            was performed, and patient medications and                            allergies were reviewed. The patient's tolerance of                            previous anesthesia was also reviewed. The risks                            and benefits of the procedure and the sedation                            options and risks were discussed with the patient.                            All questions were answered, and informed consent                            was obtained. Prior Anticoagulants: The patient has                            taken no previous anticoagulant or antiplatelet  agents. ASA Grade Assessment: III - A patient with                            severe systemic disease. After reviewing the risks                            and benefits, the patient was deemed in                            satisfactory condition to undergo the procedure.                           After obtaining informed consent, the endoscope was                            passed under direct vision. Throughout the                             procedure, the patient's blood pressure, pulse, and                            oxygen saturations were monitored continuously. The                            Endoscope was introduced through the mouth, and                            advanced to the second part of duodenum. The upper                            GI endoscopy was accomplished without difficulty.                            The patient tolerated the procedure well. Scope In: Scope Out: Findings:                 LA Grade A (one or more mucosal breaks less than 5                            mm, not extending between tops of 2 mucosal folds)                            esophagitis with no bleeding was found 38 cm from                            the incisors.                           The upper third of the esophagus, middle third of                            the esophagus and lower third of the esophagus were  normal. No rings, strictures, or luminal narrowing                            noted. No esophageal varices noted on this study.                           Moderate portal hypertensive gastropathy was found                            in the entire examined stomach. Biopsies were taken                            with a cold forceps for Helicobacter pylori                            testing. Estimated blood loss was minimal.                           Mildly erythematous mucosa without active bleeding                            was found in the duodenal bulb and in the second                            portion of the duodenum. Biopsies were taken with a                            cold forceps for histology. Estimated blood loss                            was minimal. Complications:            No immediate complications. Estimated Blood Loss:     Estimated blood loss was minimal. Impression:               - LA Grade A reflux esophagitis with no bleeding.                           - Normal upper third of  esophagus, middle third of                            esophagus and lower third of esophagus.                           - Portal hypertensive gastropathy. Biopsied.                           - Erythematous duodenopathy. Biopsied. Recommendation:           - Patient has a contact number available for                            emergencies. The signs and symptoms of potential  delayed complications were discussed with the                            patient. Return to normal activities tomorrow.                            Written discharge instructions were provided to the                            patient.                           - Resume previous diet.                           - Continue present medications.                           - Use Protonix (pantoprazole) 20 mg PO BID for 6                            weeks to promote mucosal healing, then can reduce                            to 20 mg daily for control of reflux.                           - Stop OTC omeprazole when starting Prilosec.                           - Return to GI clinic in 3 months or sooner prn. Doristine Locks, MD 08/11/2021 9:35:09 AM

## 2021-08-11 NOTE — Progress Notes (Signed)
GASTROENTEROLOGY PROCEDURE H&P NOTE   Primary Care Physician: [name], Walter, MD    Reason for Procedure:   Cirrhosis, EV screening, nausea/vomiting, dysphagia  Plan:    EGD  Patient is appropriate for endoscopic procedure(s) in the ambulatory ([facility]) setting.  The nature of the procedure, as well as the risks, benefits, and alternatives were carefully and thoroughly reviewed with the patient. Ample time for discussion and questions allowed. The patient understood, was satisfied, and agreed to proceed.     HPI: Andrew Patton is a 61 y.o. male who presents for EGD for evaluation of multiple upper GI sxs to include nausea/vomiting and intermittent dysphagia, along with EV screening.  Patient was most recently seen in the Gastroenterology Clinic on 08/05/2021 by me.  No interval change in medical history since that appointment. Please refer to that note for full details regarding GI history and clinical presentation.   Past Medical History:  Diagnosis Date   Allergy    Anxiety    Arthritis    Ascites    Diabetes (HCC)    Elevated transaminase level    Fatigue    Fatty liver    Hyperlipidemia    Hypertension    "under control", not on any blood pressure medication, has lost 60 lbs   Hypothyroidism    on synthroid   Inguinal muscle strain    Insomnia    Left knee pain    "long time ago"   Multinodular goiter    Obesity    Pneumonia    Psoriasis    Tinnitus     Past Surgical History:  Procedure Laterality Date   APPENDECTOMY  1976   FOOT SURGERY Bilateral 1975   NOSE SURGERY     ROTATOR CUFF REPAIR Left    left   SHOULDER SURGERY Right [facility]20/2019   [facility]     Prior to Admission medications   Medication Sig Start Date End Date Taking? Authorizing Provider  azelastine (ASTELIN) 0.1 % nasal spray Place 1 spray into both nostrils 2 (two) times daily. Use in each nostril as directed   Yes [provider]  azithromycin (ZITHROMAX) 250 MG  tablet Azithromycin 500 mg on day 1 followed by 250 mg daily x4 days 08/06/21  Yes Stepfanie Yott V, DO  busPIRone (BUSPAR) 15 MG tablet Take 15-30 mg by mouth See admin instructions. Take 15 mg by mouth in the morning and 22.5-30 mg after supper   Yes [provider]  clonazePAM (KLONOPIN) 0.5 MG tablet Take 0.5 mg by mouth See admin instructions. Take 0.5 mg by mouth at bedtime and an additional 0.5 mg once a day as needed for palpitations   Yes [provider]  colesevelam (WELCHOL) 625 MG tablet Take 625 mg by mouth daily with breakfast.   Yes [provider]  escitalopram (LEXAPRO) 10 MG tablet Take 5 mg by mouth daily. 01/27/21  Yes [provider]  ezetimibe (ZETIA) 10 MG tablet Take 10 mg by mouth daily.   Yes [provider]  fluticasone (FLONASE) 50 MCG/ACT nasal spray Place 1 spray into both nostrils at bedtime. 01/05/13  Yes [provider]  folic acid (FOLVITE) 1 MG tablet Take 1 tablet (1 mg total) by mouth daily. 07/07/21 10/15/21 Yes Pokhrel, Laxman, MD  furosemide (LASIX) 20 MG tablet Take 1 tablet (20 mg total) by mouth daily. 08/05/21  Yes Brittony Billick V, DO  ipratropium (ATROVENT) 0.06 % nasal spray Place 1 spray into the nose daily.  02/17/19  Yes [provider]  lactulose (CHRONULAC) 10 GM/15ML solution Take 15 mLs (10 g total) by mouth 2 (two) times daily as needed for mild constipation (for bowel movments 2-3 per day). 07/06/21  Yes Pokhrel, Laxman, MD  levETIRAcetam (KEPPRA XR) 500 MG 24 hr tablet Take 2 tablets (1,000 mg total) by mouth daily. Patient taking differently: Take 1,000 mg by mouth daily after supper. 01/29/21  Yes Anson FretAhern, Antonia B, MD  levocetirizine (XYZAL) 5 MG tablet Take 5 mg by mouth every evening.   Yes [provider]  levothyroxine (SYNTHROID) 75 MCG tablet Take 75 mcg by mouth daily before breakfast.   Yes [provider]  Multiple Vitamin (MULTIVITAMIN WITH MINERALS) TABS tablet  Take 1 tablet by mouth daily. 07/07/21 10/15/21 Yes Pokhrel, Laxman, MD  mupirocin cream (BACTROBAN) 2 % Apply 1 application topically 2 (two) times daily. Use BID , 1 week PRN for nasal crusting or scabbing Patient taking differently: Apply 1 application topically 2 (two) times daily as needed (for nasal crusting or scabbing). 06/17/20  Yes Drema HalonNewman, Christopher E, MD  ondansetron (ZOFRAN ODT) 4 MG disintegrating tablet Take 1 tablet (4 mg total) by mouth every 8 (eight) hours as needed for nausea or vomiting. Patient taking differently: Take 4 mg by mouth every 8 (eight) hours as needed for nausea or vomiting (dissolve orally). 02/08/20  Yes Graciella FreerLayden, Lindsey A, PA-C  ondansetron (ZOFRAN) 4 MG tablet Take 4 mg by mouth every 8 (eight) hours as needed. 07/21/21  Yes [provider]  spironolactone (ALDACTONE) 100 MG tablet Take 1 tablet (100 mg total) by mouth daily. 07/06/21  Yes Pokhrel, Laxman, MD  thiamine 100 MG tablet Take 1 tablet (100 mg total) by mouth daily. 07/06/21  Yes Pokhrel, Laxman, MD  traMADol (ULTRAM) 50 MG tablet Take 25 mg by mouth 3 (three) times daily as needed. 07/30/21  Yes [provider]  zolpidem (AMBIEN) 5 MG tablet Take 5 mg by mouth at bedtime.   Yes [provider]  cefpodoxime (VANTIN) 200 MG tablet Take 1 tablet (200 mg total) by mouth 2 (two) times daily. Patient not taking: Reported on 08/11/2021 08/06/21   Elizar Alpern, Gae BonVito V, DO  Cholecalciferol (VITAMIN D3) 1000 UNITS CAPS Take 1 capsule by mouth daily. Patient not taking: Reported on 08/11/2021    [provider]  Ixekizumab (TALTZ [place]) Inject into the skin every 14 (fourteen) days.    [provider]  Boris LownKrill Oil 500 MG CAPS Take 500 mg by mouth daily. Patient not taking: Reported on 08/11/2021    [provider]  Omeprazole Magnesium (PRILOSEC OTC PO) Take 1 tablet by mouth daily. Patient not taking: Reported on 08/11/2021    [provider]  rifaximin (XIFAXAN)  550 MG TABS tablet Take 1 tablet (550 mg total) by mouth 2 (two) times daily. Patient not taking: Reported on 08/11/2021 08/05/21   Easton Fetty V, DO  sildenafil (REVATIO) 20 MG tablet Take 1 tablet by mouth as needed. 02/26/19   [provider]    Current Outpatient Medications  Medication Sig Dispense Refill   azelastine (ASTELIN) 0.1 % nasal spray Place 1 spray into both nostrils 2 (two) times daily. Use in each nostril as directed     azithromycin (ZITHROMAX) 250 MG tablet Azithromycin 500 mg on day 1 followed by 250 mg daily x4 days 6 each 0   busPIRone (BUSPAR) 15 MG tablet Take 15-30 mg by mouth See admin instructions. Take 15 mg by mouth  in the morning and 22.5-30 mg after supper     clonazePAM (KLONOPIN) 0.5 MG tablet Take 0.5 mg by mouth See admin instructions. Take 0.5 mg by mouth at bedtime and an additional 0.5 mg once a day as needed for palpitations     colesevelam (WELCHOL) 625 MG tablet Take 625 mg by mouth daily with breakfast.     escitalopram (LEXAPRO) 10 MG tablet Take 5 mg by mouth daily.     ezetimibe (ZETIA) 10 MG tablet Take 10 mg by mouth daily.     fluticasone (FLONASE) 50 MCG/ACT nasal spray Place 1 spray into both nostrils at bedtime.     folic acid (FOLVITE) 1 MG tablet Take 1 tablet (1 mg total) by mouth daily. 100 tablet 0   furosemide (LASIX) 20 MG tablet Take 1 tablet (20 mg total) by mouth daily. 30 tablet 3   ipratropium (ATROVENT) 0.06 % nasal spray Place 1 spray into the nose daily.     lactulose (CHRONULAC) 10 GM/15ML solution Take 15 mLs (10 g total) by mouth 2 (two) times daily as needed for mild constipation (for bowel movments 2-3 per day).     levETIRAcetam (KEPPRA XR) 500 MG 24 hr tablet Take 2 tablets (1,000 mg total) by mouth daily. (Patient taking differently: Take 1,000 mg by mouth daily after supper.) 180 tablet 3   levocetirizine (XYZAL) 5 MG tablet Take 5 mg by mouth every evening.     levothyroxine (SYNTHROID) 75 MCG tablet Take 75  mcg by mouth daily before breakfast.     Multiple Vitamin (MULTIVITAMIN WITH MINERALS) TABS tablet Take 1 tablet by mouth daily.     mupirocin cream (BACTROBAN) 2 % Apply 1 application topically 2 (two) times daily. Use BID , 1 week PRN for nasal crusting or scabbing (Patient taking differently: Apply 1 application topically 2 (two) times daily as needed (for nasal crusting or scabbing).) 15 g 0   ondansetron (ZOFRAN ODT) 4 MG disintegrating tablet Take 1 tablet (4 mg total) by mouth every 8 (eight) hours as needed for nausea or vomiting. (Patient taking differently: Take 4 mg by mouth every 8 (eight) hours as needed for nausea or vomiting (dissolve orally).) 6 tablet 0   ondansetron (ZOFRAN) 4 MG tablet Take 4 mg by mouth every 8 (eight) hours as needed.     spironolactone (ALDACTONE) 100 MG tablet Take 1 tablet (100 mg total) by mouth daily. 30 tablet 5   thiamine 100 MG tablet Take 1 tablet (100 mg total) by mouth daily. 90 tablet 0   traMADol (ULTRAM) 50 MG tablet Take 25 mg by mouth 3 (three) times daily as needed.     zolpidem (AMBIEN) 5 MG tablet Take 5 mg by mouth at bedtime.     cefpodoxime (VANTIN) 200 MG tablet Take 1 tablet (200 mg total) by mouth 2 (two) times daily. (Patient not taking: Reported on 08/11/2021) 14 tablet 0   Cholecalciferol (VITAMIN D3) 1000 UNITS CAPS Take 1 capsule by mouth daily. (Patient not taking: Reported on 08/11/2021)     Ixekizumab (TALTZ [place]) Inject into the skin every 14 (fourteen) days.     Krill Oil 500 MG CAPS Take 500 mg by mouth daily. (Patient not taking: Reported on 08/11/2021)     Omeprazole Magnesium (PRILOSEC OTC PO) Take 1 tablet by mouth daily. (Patient not taking: Reported on 08/11/2021)     rifaximin (XIFAXAN) 550 MG TABS tablet Take 1 tablet (550 mg total) by mouth 2 (two) times daily. (  Patient not taking: Reported on 08/11/2021) 60 tablet 11   sildenafil (REVATIO) 20 MG tablet Take 1 tablet by mouth as needed.     Current Facility-Administered  Medications  Medication Dose Route Frequency Provider Last Rate Last Admin   0.9 %  sodium chloride infusion  500 mL Intravenous Once Lenwood Balsam V, DO        Allergies as of 08/11/2021 - Review Complete 08/11/2021  Allergen Reaction Noted   Crestor [rosuvastatin calcium] Other (See Comments) 03/15/2019   Meloxicam Other (See Comments) 03/08/2019   Rosuvastatin Other (See Comments) 07/02/2021   Penicillins Rash 01/24/2013    Family History  Problem Relation Age of Onset   Breast cancer Mother    Hyperlipidemia Mother    Other Mother        ? form of parkinson's    Parkinsonism Mother    Bladder Cancer Father    Parkinson's disease Father    Memory loss Maternal Grandmother        in her 90s   Birth defects Maternal Grandfather    Birth defects Paternal Grandmother    Colon cancer Neg Hx    Esophageal cancer Neg Hx    Rectal cancer Neg Hx    Stomach cancer Neg Hx    Colon polyps Neg Hx    Dementia Neg Hx    Alzheimer's disease Neg Hx     Social History   Socioeconomic History   Marital status: Married    Spouse name: Not on file   Number of children: 2   Years of education: Not on file   Highest education level: Associate degree: academic program  Occupational History   Occupation: Airline pilot  Tobacco Use   Smoking status: Former    Types: Cigarettes    Quit date: 03/27/1994    Years since quitting: 27.3   Smokeless tobacco: Former    Types: Chew   Tobacco comments:    did chew as a teenager  Vaping Use   Vaping Use: Never used  Substance and Sexual Activity   Alcohol use: Not Currently    Alcohol/week: 2.0 standard drinks    Types: 2 Glasses of wine per week   Drug use: No   Sexual activity: Not on file  Other Topics Concern   Not on file  Social History Narrative   Lives at home with wife    Right handed   Caffeine: maybe 1-3 cups/week   Social Determinants of Health   Financial Resource Strain: Not on file  Food Insecurity: Not on file   Transportation Needs: Not on file  Physical Activity: Not on file  Stress: Not on file  Social Connections: Not on file  Intimate Partner Violence: Not on file    Physical Exam: Vital signs in last 24 hours: @BP  (!) 97/56    Pulse 63    Temp 98.9 F (37.2 C)    Ht 5' 6.5" (1.689 m)    Wt 171 lb (77.6 kg)    SpO2 98%    BMI 27.19 kg/m  GEN: NAD EYE: Sclerae anicteric ENT: MMM CV: Non-tachycardic Pulm: CTA b/l GI: Soft, NT/ND NEURO:  Alert & Oriented x 3   , DO [facility]   08/11/2021 9:12 AM

## 2021-08-11 NOTE — Progress Notes (Signed)
Called to room to assist during endoscopic procedure.  Patient ID and intended procedure confirmed with present staff. Received instructions for my participation in the procedure from the performing physician.  

## 2021-08-12 ENCOUNTER — Ambulatory Visit: Payer: BC Managed Care – PPO | Admitting: Neurology

## 2021-08-12 ENCOUNTER — Telehealth: Payer: Self-pay

## 2021-08-12 NOTE — Telephone Encounter (Signed)
Received PA Approval for Pantoprazole BcBs Effective from 08/11/2021 through 08/10/2022. Pharmacy is informed.

## 2021-08-12 NOTE — Telephone Encounter (Signed)
Patient Assistant Application form for Andrew Patton is  mailed to the patient. Patient wife [name] is infromed. States she will mail back to the company once they fill out their part.

## 2021-08-12 NOTE — Progress Notes (Deleted)
[facility]    Provider:  Dr Lucia Gaskins Requesting Provider: [name], MD Primary Care Provider:  [name], MD  CC:  Memory loss, seizures, concussions  01/29/2021: He is feeling much,much better. He has lost weight. He is doing well with seizure medication and no seizures. He is dealing with a lot of anxiety and depression and he is seeing [name] working with medications and therapy. He is managing his thyroid. He has lost a lot of weight, his hgba1c is down, he is cutting down on alcohol, he has occ drinks but not like it was before. He is doing better. Right now he fights short-term memory issues. He is here with his sister today (I treated sister's husband inpatient for stroke in 2020).   Interval history 10/27/2020: MRI of the brain was normal for age. EEG showed no epileptiform activity. On Keppra. They have adjusted his thyroid medication and his hgba1c. He snores at night. He does have a deviated septum. He does not sleep well at night. He wakes frequently. He sees an ENT. No moe episodes on the Keppra. But he is very tired. He is closing his eyes dyring the appointment. He does snore, his ESS is 10. Being on the keppra has caused him a lot of zoning out. The medication makes him very tired. And then he wakes up more. When he started the medication he was a zombie.They are scheduled to go to [place] in July. He naps during the day.I would supposrt short-term disability, EEG was slowed. 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 It can be very dangerous. He has sensitivity to light, it is worse, since the concussion. He has had eye exams.   HPI:  Andrew Patton is a 61 y.o. male here as requested by [name], MD([facility]) for memory loss.  Past medical history of diabetes, elevated transaminases secondary to fatty liver, hyperlipidemia, hypothyroidism, insomnia, hypertension, obesity, psoriasis, fatigue, osteoarthritis,  diabetes with hypertension and hypercholesterolemia.  I requested his pcp's notes from Dr. Renne Crigler as well.   Patient is here with his wife who provides most information, he is having episodes of confusion, he had a concussion in July 2021 and since then he had several episodes where he would not know where he is going, wife would make him pull over. A "glazed look" in his eye. They started possibly in September of 2021, he had an episode in a restaurant where he got a glazed look in his face, he was confused and disoriented, lasting 5-10 minutes, slowly resolve, he says he remembers these. This year January 8th they were at a winery and they were there at 12:15 maybe been there 20-25 minutes and sitting talking at a table and he was eating nuts, he had not had anything to eat yet, he took his shirt off, he said he was going to bed, he wants to go lay down, didn;t know where he was, thought he was at home, they got him into a chair in the office and wanted to get up and wander. They checked heart rate and pulse, it was fine, no loss of consciousness, he would not answer questions such as "do you know who I am", no focal weakness at the time, no speech slurring, they called the EMT and his glucose was 242.  He has had a couple other smaller episodes where he was more attentive but disoriented still. The one on January 8th of this year lasted 25  minutes and slowly resolved like "waking up from a dream". His ammonia level was elevated. The last episode was on the 18th of January, they went to the store and he did not look right, he had the grocery cart and kept running into the sides.     I reviewed [name] notes:  he is a 61 year old male with history of prediabetes, liver disease, arthritis that complains of multiple episodes of disorientation and memory last, he had a traumatic brain injury in July 2021 but acute imaging CT of the brain showed no acute findings, since then he has had multiple episodes most  recently earlier this month of 25 to 30 minutes of disorientation where his wife noticed he was staring off and not his normal self, no focal seizure activity, no loss of consciousness, no focal weakness, numbness or tingling, I also reviewed Dr. Dorthula Nettles notes, it appears patient was seen on August 13, 2020, patient reported during that visit that he was somewhere and had not eaten that morning, he had a glass of [place], he was eating nuts and berries, when he got up and started walking he wandered around the winery, was completely "not there" for 25 minutes, he woke up in the office, he had buspirone, Lasix and WelChol, per his wife at 1255 he stood up and took off his shirt, very disoriented, agitated and confused not verbal no seizures, EMT arrived and blood sugar was 242, was not seen by the doctor at the emergency room, EKG was reported to be normal, he went to the ER but decided to skip being seen by MD due to the weight.  Labs drawn the same day included a CBC which was unremarkable, it appears ammonia was also taken, he has elevated transaminases secondary to fatty liver, CMP that was collected July 10, 2020 showed an elevated glucose, elevated AST at 94 glucose 135, BUN 9, creatinine 0.9 otherwise unremarkable, hemoglobin A1c July 10, 2020 7.5, free T4 July 10, 2020 1.47, LDL 180, TSH elevated 6.47 July 10, 2020.   I also reviewed notes from the emergency room in July 2021 where he presented for evaluation of headache, nausea, fatigue, photophobia, pain to the right eye after mechanical fall 9 days prior, he was walking in the middle the night from the bedroom to the bathroom when he tripped and fell hitting his face and head against the dresser.  CT of the head did not show anything acute.   Reviewed notes, labs and imaging from outside physicians, which showed:  CT of the head February 08, 2020: Showed generalized mild atrophy, possibly a 9 mm right frontal lobe calcification versus tiny  meningioma, personally reviewed images and agree with findings. Review of Systems: Patient complains of symptoms per HPI as well as the following symptoms: short-term memory loss . Pertinent negatives and positives per HPI. All others negative     Social History   Socioeconomic History   Marital status: Married    Spouse name: Not on file   Number of children: 2   Years of education: Not on file   Highest education level: Associate degree: academic program  Occupational History   Occupation: sales  Tobacco Use   Smoking status: Former    Types: Cigarettes    Quit date: 03/27/1994    Years since quitting: 27.3   Smokeless tobacco: Former    Types: Chew   Tobacco comments:    did chew as a teenager  Vaping Use   Vaping Use: Never  used  Substance and Sexual Activity   Alcohol use: Not Currently    Alcohol/week: 2.0 standard drinks    Types: 2 Glasses of wine per week   Drug use: No   Sexual activity: Not on file  Other Topics Concern   Not on file  Social History Narrative   Lives at home with wife    Right handed   Caffeine: maybe 1-3 cups/week   Social Determinants of Health   Financial Resource Strain: Not on file  Food Insecurity: Not on file  Transportation Needs: Not on file  Physical Activity: Not on file  Stress: Not on file  Social Connections: Not on file  Intimate Partner Violence: Not on file    Family History  Problem Relation Age of Onset   Breast cancer Mother    Hyperlipidemia Mother    Other Mother        ? form of parkinson's    Parkinsonism Mother    Bladder Cancer Father    Parkinson's disease Father    Memory loss Maternal Grandmother        in her 5990s   Birth defects Maternal Grandfather    Birth defects Paternal Grandmother    Colon cancer Neg Hx    Esophageal cancer Neg Hx    Rectal cancer Neg Hx    Stomach cancer Neg Hx    Colon polyps Neg Hx    Dementia Neg Hx    Alzheimer's disease Neg Hx     Past Medical History:   Diagnosis Date   Allergy    Anxiety    Arthritis    Ascites    Diabetes (HCC)    Elevated transaminase level    Fatigue    Fatty liver    Hyperlipidemia    Hypertension    "under control", not on any blood pressure medication, has lost 60 lbs   Hypothyroidism    on synthroid   Inguinal muscle strain    Insomnia    Left knee pain    "long time ago"   Multinodular goiter    Obesity    Pneumonia    Psoriasis    Tinnitus     Patient Active Problem List   Diagnosis Date Noted   Cirrhosis of liver with ascites (HCC)    Decompensated hepatic cirrhosis (HCC)    Hyperbilirubinemia 07/02/2021   Elevated liver enzymes 07/02/2021   History of seizure 07/02/2021   Hyponatremia 07/02/2021   Hypokalemia 07/02/2021   Depression 07/02/2021   Anxiety 07/02/2021   Insomnia due to medical condition 11/28/2020   Snoring 11/28/2020   Complex partial seizures with impaired consciousness at onset [facility](HCC) 10/27/2020   Cognitive complaints 10/27/2020   Daytime somnolence 10/27/2020   Hypothyroidism 10/27/2020   Uncontrolled type 2 diabetes mellitus with hyperglycemia (HCC) 10/27/2020   Chronic post concussive encephalopathy 10/27/2020   Abdominal distention    Diarrhea    Rectal bleeding 03/08/2019   Abnormal liver function 03/08/2019   Portal vein thrombosis 03/07/2019   Ascites of liver 03/07/2019   Abdominal pain 03/07/2019   Unspecified essential hypertension 04/29/2014   Other and unspecified hyperlipidemia 04/29/2014    Past Surgical History:  Procedure Laterality Date   APPENDECTOMY  1976   FOOT SURGERY Bilateral 1975   NOSE SURGERY     ROTATOR CUFF REPAIR Left    left   [facility]     Current Outpatient Medications  Medication Sig Dispense Refill  azelastine (ASTELIN) 0.1 % nasal spray Place 1 spray into both nostrils 2 (two) times daily. Use in each nostril as directed     azithromycin (ZITHROMAX) 250 MG tablet  Azithromycin 500 mg on day 1 followed by 250 mg daily x4 days 6 each 0   busPIRone (BUSPAR) 15 MG tablet Take 15-30 mg by mouth See admin instructions. Take 15 mg by mouth in the morning and 22.5-30 mg after supper     cefpodoxime (VANTIN) 200 MG tablet Take 1 tablet (200 mg total) by mouth 2 (two) times daily. (Patient not taking: Reported on 08/11/2021) 14 tablet 0   Cholecalciferol (VITAMIN D3) 1000 UNITS CAPS Take 1 capsule by mouth daily. (Patient not taking: Reported on 08/11/2021)     clonazePAM (KLONOPIN) 0.5 MG tablet Take 0.5 mg by mouth See admin instructions. Take 0.5 mg by mouth at bedtime and an additional 0.5 mg once a day as needed for palpitations     colesevelam (WELCHOL) 625 MG tablet Take 625 mg by mouth daily with breakfast.     escitalopram (LEXAPRO) 10 MG tablet Take 5 mg by mouth daily.     ezetimibe (ZETIA) 10 MG tablet Take 10 mg by mouth daily.     fluticasone (FLONASE) 50 MCG/ACT nasal spray Place 1 spray into both nostrils at bedtime.     folic acid (FOLVITE) 1 MG tablet Take 1 tablet (1 mg total) by mouth daily. 100 tablet 0   furosemide (LASIX) 20 MG tablet Take 1 tablet (20 mg total) by mouth daily. 30 tablet 3   ipratropium (ATROVENT) 0.06 % nasal spray Place 1 spray into the nose daily.     Ixekizumab (TALTZ Mc[place]arlan) Inject into the skin every 14 (fourteen) days.     Krill Oil 500 MG CAPS Take 500 mg by mouth daily. (Patient not taking: Reported on 08/11/2021)     lactulose (CHRONULAC) 10 GM/15ML solution Take 15 mLs (10 g total) by mouth 2 (two) times daily as needed for mild constipation (for bowel movments 2-3 per day).     levETIRAcetam (KEPPRA XR) 500 MG 24 hr tablet Take 2 tablets (1,000 mg total) by mouth daily. (Patient taking differently: Take 1,000 mg by mouth daily after supper.) 180 tablet 3   levocetirizine (XYZAL) 5 MG tablet Take 5 mg by mouth every evening.     levothyroxine (SYNTHROID) 75 MCG tablet Take 75 mcg by mouth daily before breakfast.     Multiple  Vitamin (MULTIVITAMIN WITH MINERALS) TABS tablet Take 1 tablet by mouth daily.     mupirocin cream (BACTROBAN) 2 % Apply 1 application topically 2 (two) times daily. Use BID , 1 week PRN for nasal crusting or scabbing (Patient taking differently: Apply 1 application topically 2 (two) times daily as needed (for nasal crusting or scabbing).) 15 g 0   ondansetron (ZOFRAN ODT) 4 MG disintegrating tablet Take 1 tablet (4 mg total) by mouth every 8 (eight) hours as needed for nausea or vomiting. (Patient taking differently: Take 4 mg by mouth every 8 (eight) hours as needed for nausea or vomiting (dissolve orally).) 6 tablet 0   ondansetron (ZOFRAN) 4 MG tablet Take 4 mg by mouth every 8 (eight) hours as needed.     pantoprazole (PROTONIX) 20 MG tablet Protonix 20mg  po BID for 6 weeks to promote mucosal healing then reduce to 20mg  daily for control of reflux. 60 tablet 1   pantoprazole (PROTONIX) 20 MG tablet Take 1 tablet (20 mg total) by mouth daily.  30 tablet 11   rifaximin (XIFAXAN) 550 MG TABS tablet Take 1 tablet (550 mg total) by mouth 2 (two) times daily. (Patient not taking: Reported on 08/11/2021) 60 tablet 11   sildenafil (REVATIO) 20 MG tablet Take 1 tablet by mouth as needed.     spironolactone (ALDACTONE) 100 MG tablet Take 1 tablet (100 mg total) by mouth daily. 30 tablet 5   thiamine 100 MG tablet Take 1 tablet (100 mg total) by mouth daily. 90 tablet 0   traMADol (ULTRAM) 50 MG tablet Take 25 mg by mouth 3 (three) times daily as needed.     zolpidem (AMBIEN) 5 MG tablet Take 5 mg by mouth at bedtime.     No current facility-administered medications for this visit.    Allergies as of 08/12/2021 - Review Complete 08/11/2021  Allergen Reaction Noted   Crestor [rosuvastatin calcium] Other (See Comments) 03/15/2019   Meloxicam Other (See Comments) 03/08/2019   Rosuvastatin Other (See Comments) 07/02/2021   Penicillins Rash 01/24/2013    Vitals: There were no vitals taken for this  visit. Last Weight:  Wt Readings from Last 1 Encounters:  08/11/21 171 lb (77.6 kg)   Last Height:   Ht Readings from Last 1 Encounters:  08/11/21 5' 6.5" (1.689 m)   Exam: NAD, pleasant                  Speech:    Speech is normal; fluent and spontaneous with normal comprehension.  Cognition:    The patient is oriented to person, place, and time;      language fluent;   MMSE - Mini Mental State Exam 09/04/2020  Orientation to time 4  Orientation to Place 5  Registration 3  Attention/ Calculation 5  Recall 3  Language- name 2 objects 2  Language- repeat 1  Language- follow 3 step command 3  Language- read & follow direction 1  Write a sentence 1  Copy design 1  Total score 29      Cranial Nerves:    The pupils are equal, round, and reactive to light.Trigeminal sensation is intact and the muscles of mastication are normal. The face is symmetric. The palate elevates in the midline. Hearing intact. Voice is normal. Shoulder shrug is normal. The tongue has normal motion without fasciculations.   Coordination:  No dysmetria  Motor Observation:    No asymmetry, no atrophy, and no involuntary movements noted. Tone:    Normal muscle tone.     Strength:    Strength is V/V in the upper and lower limbs.      Sensation: intact to LT     Assessment/Plan:  61 year old with witnessed, sterotyped episodes of alteration of awareness and confusion after TBI and concussion.PMHx prior concussions.  Sounds like  "complex partial" seizures now called focal impaired awareness.   - we have been working on a lot of his medical and neurologic problems and he is feeling much better and is in great spirits - Sleep study was negative - He has lost much weight and is getting is hgba1c down - He has significantly cut back on etoh due to liver problems, doing well - Bloodwork last appointment showed elevated TSH and he being managed by endocrinology - 72 hour eeg was normal and he is not  having anymore episodes on keppra ER - will refer for neurocognitive testing due to multiple concussions (his next concern is his short-term memory difficulties). - He can resume driving short distances  during the day - MRI of the brain w/wo contrast seizure protocol was unremarkable normal for age.     No orders of the defined types were placed in this encounter.   No orders of the defined types were placed in this encounter.    Cc: [name], MD,  [name], MD  Naomie Dean, MD  [facility] [address] [place], [place] [address]  Phone [phone] Fax [phone]  I spent over 40  minutes of face-to-face and non-face-to-face time with patient on the  No diagnosis found.   diagnosis.  This included previsit chart review, lab review, study review, order entry, electronic health record documentation, patient education on the different diagnostic and therapeutic options, counseling and coordination of care, risks and benefits of management, compliance, or risk factor reduction

## 2021-08-12 NOTE — Telephone Encounter (Signed)
Error

## 2021-08-13 ENCOUNTER — Encounter: Payer: Self-pay | Admitting: Gastroenterology

## 2021-08-13 ENCOUNTER — Telehealth: Payer: Self-pay

## 2021-08-13 ENCOUNTER — Telehealth: Payer: Self-pay | Admitting: *Deleted

## 2021-08-13 NOTE — Telephone Encounter (Signed)
No answer for post procedure call back. Left VM. 

## 2021-08-13 NOTE — Telephone Encounter (Signed)
°  Follow up Call-  Call back number 08/11/2021 04/16/2019  Post procedure Call Back phone  # [phone] [name]) [phone]  Permission to leave phone message Yes Yes  Some recent data might be hidden    1st follow up call made.  NALM

## 2021-08-18 DIAGNOSIS — E039 Hypothyroidism, unspecified: Secondary | ICD-10-CM | POA: Diagnosis not present

## 2021-08-20 DIAGNOSIS — E039 Hypothyroidism, unspecified: Secondary | ICD-10-CM | POA: Diagnosis not present

## 2021-08-20 DIAGNOSIS — E785 Hyperlipidemia, unspecified: Secondary | ICD-10-CM | POA: Diagnosis not present

## 2021-08-25 DIAGNOSIS — Z79899 Other long term (current) drug therapy: Secondary | ICD-10-CM | POA: Diagnosis not present

## 2021-08-25 DIAGNOSIS — M1990 Unspecified osteoarthritis, unspecified site: Secondary | ICD-10-CM | POA: Diagnosis not present

## 2021-08-25 DIAGNOSIS — L4050 Arthropathic psoriasis, unspecified: Secondary | ICD-10-CM | POA: Diagnosis not present

## 2021-08-25 DIAGNOSIS — L409 Psoriasis, unspecified: Secondary | ICD-10-CM | POA: Diagnosis not present

## 2021-09-02 DIAGNOSIS — K429 Umbilical hernia without obstruction or gangrene: Secondary | ICD-10-CM | POA: Diagnosis not present

## 2021-09-02 DIAGNOSIS — J189 Pneumonia, unspecified organism: Secondary | ICD-10-CM | POA: Diagnosis not present

## 2021-09-14 ENCOUNTER — Encounter: Payer: BC Managed Care – PPO | Attending: Psychology | Admitting: Psychology

## 2021-09-14 ENCOUNTER — Other Ambulatory Visit: Payer: Self-pay

## 2021-09-14 DIAGNOSIS — R419 Unspecified symptoms and signs involving cognitive functions and awareness: Secondary | ICD-10-CM | POA: Insufficient documentation

## 2021-09-14 DIAGNOSIS — R413 Other amnesia: Secondary | ICD-10-CM | POA: Insufficient documentation

## 2021-09-14 DIAGNOSIS — Z87898 Personal history of other specified conditions: Secondary | ICD-10-CM | POA: Diagnosis not present

## 2021-09-14 DIAGNOSIS — F0781 Postconcussional syndrome: Secondary | ICD-10-CM | POA: Insufficient documentation

## 2021-09-14 DIAGNOSIS — Z87820 Personal history of traumatic brain injury: Secondary | ICD-10-CM | POA: Insufficient documentation

## 2021-09-22 ENCOUNTER — Other Ambulatory Visit: Payer: Self-pay

## 2021-09-22 DIAGNOSIS — Z87820 Personal history of traumatic brain injury: Secondary | ICD-10-CM | POA: Diagnosis not present

## 2021-09-22 DIAGNOSIS — R413 Other amnesia: Secondary | ICD-10-CM

## 2021-09-22 DIAGNOSIS — Z87898 Personal history of other specified conditions: Secondary | ICD-10-CM

## 2021-09-22 DIAGNOSIS — R419 Unspecified symptoms and signs involving cognitive functions and awareness: Secondary | ICD-10-CM | POA: Diagnosis not present

## 2021-09-22 DIAGNOSIS — F0781 Postconcussional syndrome: Secondary | ICD-10-CM | POA: Diagnosis not present

## 2021-09-22 NOTE — Progress Notes (Signed)
Behavioral Observations  The patient appeared well-groomed and appropriately dressed. His manners were polite and appropriate to the situation. The patient's speech was coherent but slightly slurred. He required frequent repetition as he forgot instructions often and quickly. The patient also frequently zoned out and required prompting. The patient had significant difficulty understanding testing instructions. He maintained a positive attitude toward testing and showed good effort.   Neuropsychology Note  Andrew Patton completed 240 minutes of neuropsychological testing with technician, Marica Otter, BA, under the supervision of Arley Phenix, PsyD., Clinical Neuropsychologist. The patient did not appear overtly distressed by the testing session, per behavioral observation or via self-report to the technician. Rest breaks were offered.   Clinical Decision Making: In considering the patient's current level of functioning, level of presumed impairment, nature of symptoms, emotional and behavioral responses during clinical interview, level of literacy, and observed level of motivation/effort, a battery of tests was selected by Dr. Kieth Brightly during initial consultation on 09/14/2021. This was communicated to the technician. Communication between the neuropsychologist and technician was ongoing throughout the testing session and changes were made as deemed necessary based on patient performance on testing, technician observations and additional pertinent factors such as those listed above.  Tests Administered: Wechsler Adult Intelligence Scale, 4th Edition (WAIS-IV) Wechsler Memory Scale, 4th Edition (WMS-IV); Adult Battery    Results:  Composite Score Summary  Scale Sum of Scaled Scores Composite Score Percentile Rank 95% Conf. Interval Qualitative Description  Verbal Comprehension 23 VCI 87 19 82-93 Low Average  Perceptual Reasoning 10 PRI 60 0.4 56-68 Extremely Low  Working Memory 7  WMI 63 1 58-72 Extremely Low  Processing Speed 2 PSI 50 <0.1 47-63 Extremely Low  Full Scale 42 FSIQ 61 0.5 58-66 Extremely Low  General Ability 33 GAI 72 3 68-78 Borderline      Verbal Comprehension Subtests Summary  Subtest Raw Score Scaled Score Percentile Rank Reference Group Scaled Score SEM  Similarities 23 9 37 9 1.08  Vocabulary 23 6 9 7  0.73  Information 10 8 25 8  0.67  (Comprehension) 16 6 9 6  1.08       Perceptual Reasoning Subtests Summary  Subtest Raw Score Scaled Score Percentile Rank Reference Group Scaled Score SEM  Block Design 6 2 0.4 2 1.04  Matrix Reasoning 8 6 9 4  0.95  Visual Puzzles 2 2 0.4 1 0.99  (Figure Weights) 4 4 2 3  0.99  (Picture Completion) 3 3 1 2  1.12       Working Raw Score Scaled Score Percentile Rank Reference Group Scaled Score SEM  Digit Span 11 2 0.4 1 0.85  Arithmetic 8 5 5 5  1.04  (Letter-Number Seq.) 11 5 5 4  1.08       Processing Speed Subtests Summary  Subtest Raw Score Scaled Score Percentile Rank Reference Group Scaled Score SEM  Symbol Search 3 1 0.1 1 1.31  Coding 11 1 0.1 1 0.99  (Cancellation) 10 1 0.1 1 1.34      Index Score Summary  Index Sum of Scaled Scores Index Score Percentile Rank 95% Confidence Interval Qualitative Descriptor  Auditory Memory (AMI) 24 77 6 72-84 Borderline  Visual Memory (VMI) 4 40 <0.1 37-48 Extremely Low  Visual Working Memory (VWMI) 11 73 4 68-82 Borderline  Immediate Memory (IMI) 9 47 <0.1 43-56 Extremely Low  Delayed Memory (DMI) 19 64 1 59-73 Extremely Low       Primary Subtest Scaled Score Summary  Subtest  Domain Raw Score Scaled Score Percentile Rank  Logical Memory I AM 7 1 0.1  Logical Memory II AM 11 6 9   Verbal Paired Associates I AM 16 6 9   Verbal Paired Associates II AM 10 11 63  Designs I VM 25 1 0.1  Designs II VM 21 1 0.1  Visual Reproduction I VM 4 1 0.1  Visual Reproduction II VM 0 1 0.1  Spatial Addition VWM 4 5 5    Symbol Span VWM 11 6 9        Auditory Memory Process Score Summary  Process Score Raw Score Scaled Score Percentile Rank Cumulative Percentage (Base Rate)  LM II Recognition 17 - - <=2%  VPA II Recognition 37 - - 26-50%       Visual Memory Process Score Summary  Process Score Raw Score Scaled Score Percentile Rank Cumulative Percentage (Base Rate)  DE I Content 18 2 0.4 -  DE I Spatial 5 2 0.4 -  DE II Content 19 4 2  -  DE II Spatial 2 1 0.1 -  DE II Recognition 7 - - <=2%  VR II Recognition 3 - - 10-16%       ABILITY-MEMORY ANALYSIS  Ability Score:  VCI: 87 Date of Testing:  WAIS-IV; WMS-IV 2021/09/22  Predicted Difference Method   Index Predicted WMS-IV Index Score Actual WMS-IV Index Score Difference Critical Value  Significant Difference Y/N Base Rate  Auditory Memory 93 77 16 9.00 Y 10-15%  Visual Memory 94 40 54 8.38 Y <1%  Visual Working Memory 93 73 20 10.86 Y 5-10%  Immediate Memory 92 47 45 10.12 Y <1%  Delayed Memory 93 64 29 9.95 Y 1-2%  Statistical significance (critical value) at the .01 level.        Feedback to Patient: Andrew Patton will return on 12/03/2021 for an interactive feedback session with Dr. at which time his test performances, clinical impressions and treatment recommendations will be reviewed in detail. The patient understands he can contact our office should he require our assistance before this time.  240 minutes spent face-to-face with patient administering standardized tests, 30 minutes spent scoring ). [CPT 2021/09/24, 96139]  Full report to follow.

## 2021-09-28 DIAGNOSIS — M25561 Pain in right knee: Secondary | ICD-10-CM | POA: Diagnosis not present

## 2021-09-28 DIAGNOSIS — S60512A Abrasion of left hand, initial encounter: Secondary | ICD-10-CM | POA: Diagnosis not present

## 2021-09-28 DIAGNOSIS — M25562 Pain in left knee: Secondary | ICD-10-CM | POA: Diagnosis not present

## 2021-09-28 DIAGNOSIS — E039 Hypothyroidism, unspecified: Secondary | ICD-10-CM | POA: Diagnosis not present

## 2021-09-30 ENCOUNTER — Encounter: Payer: Self-pay | Admitting: Psychology

## 2021-09-30 ENCOUNTER — Other Ambulatory Visit: Payer: Self-pay

## 2021-09-30 ENCOUNTER — Encounter: Payer: BC Managed Care – PPO | Attending: Psychology | Admitting: Psychology

## 2021-09-30 DIAGNOSIS — E119 Type 2 diabetes mellitus without complications: Secondary | ICD-10-CM | POA: Insufficient documentation

## 2021-09-30 DIAGNOSIS — G40909 Epilepsy, unspecified, not intractable, without status epilepticus: Secondary | ICD-10-CM | POA: Insufficient documentation

## 2021-09-30 DIAGNOSIS — Z87898 Personal history of other specified conditions: Secondary | ICD-10-CM | POA: Diagnosis not present

## 2021-09-30 DIAGNOSIS — M25562 Pain in left knee: Secondary | ICD-10-CM | POA: Diagnosis not present

## 2021-09-30 DIAGNOSIS — Z87820 Personal history of traumatic brain injury: Secondary | ICD-10-CM | POA: Diagnosis not present

## 2021-09-30 DIAGNOSIS — F0781 Postconcussional syndrome: Secondary | ICD-10-CM | POA: Diagnosis not present

## 2021-09-30 DIAGNOSIS — F419 Anxiety disorder, unspecified: Secondary | ICD-10-CM | POA: Insufficient documentation

## 2021-09-30 DIAGNOSIS — R413 Other amnesia: Secondary | ICD-10-CM

## 2021-09-30 DIAGNOSIS — R419 Unspecified symptoms and signs involving cognitive functions and awareness: Secondary | ICD-10-CM

## 2021-09-30 DIAGNOSIS — R269 Unspecified abnormalities of gait and mobility: Secondary | ICD-10-CM | POA: Insufficient documentation

## 2021-09-30 DIAGNOSIS — M25561 Pain in right knee: Secondary | ICD-10-CM | POA: Diagnosis not present

## 2021-09-30 NOTE — Progress Notes (Signed)
Neuropsychological Evaluation   Patient:  [name]   DOB: February 10, 1961  MR Number: [id number]  Location: [facility] PHYSICAL MEDICINE AND REHABILITATION [address] [place], [address] [id number] [facility] [place] [address] Dept: [phone]  Start: 1 PM End: 2 PM  Provider/Observer:     Hershal Coria PsyD  Chief Complaint:      Chief Complaint  Patient presents with   Memory Loss   Stress   Seizures   Anxiety   Other    Motor and gait changes    Reason For Service:     Andrew Patton is a 61 year old male referred by his treating neurologist Naomie Dean, MD for neuropsychological evaluation as part of the larger neurological work-up.  The patient was initially referred for neurological work-up by his treating primary care physician [name], MD due to concerns around memory loss and cognitive changes.  Patient has a past history of multiple concussions.  The patient has a past medical history that includes seizures (complex partial seizures with impaired consciousness at onset), diabetes, elevated transaminasemia secondary to fatty liver, hyperlipidemia, hypothyroidism, insomnia, hypertension, obesity, cirrhosis, fatigue, osteoarthritis, diabetes with hypertension and hypercholesterolemia.  The patient has had recent MRI and EEGs conducted with overall the brain MRI within normal limits for age and EEG showing no elective form epileptiform activity.  Patient is being managed with Keppra due to seizure disorder.  The patient is described by his wife in medical records as having episodes of confusion that really developed after a concussion in July 2021.  There have been several episodes where he would not know where he is going and the wife would make him pullover.  She described a "glazed look" in his eyes.  They both felt that the symptoms possibly started in September 2021.  When these events happen he will be  very confused and disoriented lasting 5 to 10 minutes and slowly resolved.  There was a significant event in January when the patient and his wife are at a winery and arriving around 1215 and only had been there for 20 to 25 minutes.  They were sitting at a table and he was eating nuts and had not had any alcohol at that point.  The patient became confused and took his shirt off and told his wife that he was going to bed and that he wanted to go lay down.  He was confused of where he was thinking that he was at home.  They were able to get him into a chair in the office of the restaurant but he kept wanting to get up and wander.  There is no loss of consciousness and heart rate and pulse were within normal limits.  The patient could not answer basic questions asked of him by his wife such as "do you know who I am."  There was no focal weakness at the time.  No speech slurring.  EMS was called with glucose 242.  The patient has had other smaller events as well.  As have his notes from his neurologist and June 2022 he reported that he was doing much better and has lost some weight.  He was doing well on seizure medications and had no further seizure-like events.  The patient has been dealing with a lot of anxiety and depression and is seeing a psychiatrist/therapist and working on medications and therapy.  He is better managing his thyroid condition and is A1c is down.  Patient is cutting down on alcohol.  However, he was continuing to have difficulty with short-term memory issues.  During the clinical interview today, the patient and his wife reports that he continues to have difficulties with gait and walking and has experienced some loss of muscle along with his short-term memory issues.  These memory issues are described as quite problematic and are his primary difficulty now.  He is continuing to take Keppra but is experiencing what they described as a steady worsening of his memory functions.  Patient's wife  is reporting some issues with worsening executive function and problem-solving issues as a baseline with acute events where he is having great difficulty figuring things out.  The patient has had 2 significant falls over the past couple of weeks when I saw him.  There was also some degree of geographic disorientation early on but some of these episodes of confusion have improved.  The patient has had multiple falls over the years dating back as far as 76.  The most recent significant fall with concussion was in 2021.  These falls included striking his head and having concussive event.  The patient's wife reports that in September 2021 she started noticing him spacing out with a glazed look on his face.  She described these as episodic in nature.  She recounted an earlier described event when they were at a winery.  When this happened they had had nothing to drink at that point.  She reports that he was fine by the time they got to the emergency department.  Patient was referred to Dr. Lucia Gaskins by his PCP PCP and he was started on Keppra.  There has been a significant reduction in these episodic events of confusion and disorientation.  There have been some brief episodes of acute confusion even recently.  The patient has a prescription for clonazepam where he takes 0.5 mg at bedtime and takes 1 during the day as needed.  He is continue to take his Keppra.  He is also taking Lexapro for mood and also takes Ambien at night for sleep.  The patient has taken BuSpar in the past but I failed to ask whether he is continuing to take that.  The patient describes his sleep pattern consisting of taking his sleeping medications and anxiety medications each night before attempting to go to sleep.  The patient reports that he goes to bed but gets up at least 2 times each night to go to the bathroom.  The patient does have a history of snoring and the patient had a sleep study conducted that was negative for obstructive sleep  apnea.  The patient had an EEG done recently without any findings of abnormalities.  The patient had an MRI done with and without contrast on 09/23/2020.  This was interpreted by for Guadlupe Spanish, MD.  He reports that there was no indication of acute infarction or intracranial hemorrhage.  No abnormalities were noticed in his MRI.  There is no family history of Alzheimer's but the patient's mother was diagnosed with some form of Parkinson's and his father was as well.  The patient denies any tremor although he has had motor involvement in his symptoms.  Patient denies any visual or auditory hallucinations.  Tests Administered: Wechsler Adult Intelligence Scale, 4th Edition (WAIS-IV) Wechsler Memory Scale, 4th Edition (WMS-IV); Adult Battery   Participation Level:   Active  Participation Quality:  Appropriate      Behavioral Observation:  The patient appeared well-groomed and appropriately  dressed. His manners were polite and appropriate to the situation. The patient's speech was coherent but slightly slurred. He required frequent repetition as he forgot instructions often and quickly. The patient also frequently zoned out and required prompting. The patient had significant difficulty understanding testing instructions. He maintained a positive attitude toward testing and showed good effort.  Well Groomed, Alert, and Appropriate.   Test Results:   Initially an estimation was made as to the patient's premorbid intellectual and cognitive functioning.  The patient graduated with his associates degree in technology after graduated from high school and always did very well in math classes.  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  It is estimated that the patient's premorbid intellectual and cognitive functioning would  have been around the average to high average range relative to a normative population.  We will set a conservative value with these measures around a 110 for comparison purposes.  Composite Score Summary         Scale Sum of Scaled Scores Composite Score Percentile Rank 95% Conf. Interval Qualitative Description  Verbal Comprehension 23 VCI 87 19 82-93 Low Average  Perceptual Reasoning 10 PRI 60 0.4 56-68 Extremely Low  Working Memory 7 WMI 63 1 58-72 Extremely Low  Processing Speed 2 PSI 50 <0.1 47-63 Extremely Low  Full Scale 42 FSIQ 61 0.5 58-66 Extremely Low  General Ability 33 GAI 72 3 68-78 Borderline    The patient was administered the Wechsler Adult Intelligence Scale-IV to provide a structured well normed assessment of a broad range of intellectual and cognitive functioning domains.  It is cautioned not to interpret these results as representing his longstanding premorbid functioning as the patient and wife both are reporting significant changes in cognition and memory.  These numbers should be looked at specifically as a description of his current level of functioning.  It should be noted that the patient required frequent repetition of instructions and he often forgot quite quickly what the task at hand was.  He was also described as frequently zoning out and requiring prompting and redirection throughout this test battery.  The patient produced a full-scale IQ score of 61 which falls below the 1st percentile and is in the extremely low range relative to a normative population.  This is significantly below predicted levels of premorbid functioning and suggest multiple areas of change and/or loss of function.  We also calculated the patient's general abilities index score which places less emphasis on measures that are typical he most vulnerable and variable to acute changes including auditory encoding and information processing speed.  The patient produced a general abilities index score  of 72 which falls in the 3rd percentile and in the borderline range but also roughly 30 points below predicted levels of premorbid functioning.             Verbal Comprehension Subtests Summary     Subtest Raw Score Scaled Score Percentile Rank Reference Group Scaled Score SEM  Similarities 23 9 37 9 1.08  Vocabulary 23 6 9 7  0.73  Information 10 8 25 8  0.67  (Comprehension) 16 6 9 6  1.08      The patient produced a verbal comprehension index score of 87 which falls at the 19th percentile and is in the low average range.  This is below levels are  addicted as to premorbid functioning.  There was considerable scatter noted on subtest.  Patient performed in the average and lower end of the average range with regard to his verbal reasoning and problem-solving abilities in general fund of information.  The patient had significant weaknesses in his vocabulary knowledge and social judgment comprehension both of which typically would be viewed as more stable and well-preserved types of components with regard to cognition.             Perceptual Reasoning Subtests Summary     Subtest Raw Score Scaled Score Percentile Rank Reference Group Scaled Score SEM  Block Design 6 2 0.4 2 1.04  Matrix Reasoning 8 6 9 4  0.95  Visual Puzzles 2 2 0.4 1 0.99  (Figure Weights) 4 4 2 3  0.99  (Picture Completion) 3 3 1 2  1.12      The patient produced a perceptual reasoning index score of 60 which falls below the 1st percentile and is in the extremely low range relative to a normative population.  This is roughly 40-50 points below predicted levels.  All elements/subtest of this index were in the mild to severely impaired range relative to a normative population.  The patient had mild deficits with regard to visual reasoning and problem-solving types of measures and showed significant impairments on measures of visual analysis and organization, visual estimation and visual prediction capacity as well as his ability to  identify visual anomalies within a visual gestalt.             Working PrintmakerMemory Subtests Summary    Subtest Raw Score Scaled Score Percentile Rank Reference Group Scaled Score SEM  Digit Span 11 2 0.4 1 0.85  Arithmetic 8 5 5 5  1.04  (Letter-Number Seq.) 11 5 5 4  1.08      The patient produced a working memory index score of 63 which falls at the 1st percentile and is in the extremely low range relative to a normative population.  The patient had consistent difficulties both with pure auditory encoding measures as well as his ability to maintain in process information and his active auditory register.  This level of encoding deficits would make any type of learning and memory quite problematic and difficult.             Processing Speed Subtests Summary     Subtest Raw Score Scaled Score Percentile Rank Reference Group Scaled Score SEM  Symbol Search 3 1 0.1 1 1.31  Coding 11 1 0.1 1 0.99  (Cancellation) 10 1 0.1 1 1.34      The patient produced a processing speed index score of 50 which falls well below the 0.1 percentile and in the extremely low range relative to a normative population.  All subtest measures were severely impaired.  This is consistent with significant slowing and information processing speed, difficulty effectively visually scanning and processing information effectively.  These are severe deficits with regard to focus execute abilities.           Index Score Summary      Index Sum of Scaled Scores Index Score Percentile Rank 95% Confidence Interval Qualitative Descriptor  Auditory Memory (AMI) 24 77 6 72-84 Borderline  Visual Memory (VMI) 4 40 <0.1 37-48 Extremely Low  Visual Working Memory (VWMI) 11 73 4 68-82 Borderline  Immediate Memory (IMI) 9 47 <0.1 43-56 Extremely Low  Delayed Memory (DMI) 19 64 1 59-73 Extremely Low    The patient was then administered the Wechsler  Memory Scale-IV to provide a structured well normed assessment of multiple memory and  learning domains.  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  The patient showed similar deficits for visual working memory as he produced a visual working memory index score of 73 which fell at the 4th percentile and in the borderline range.  While his visual encoding capacity was slightly better than his auditory encoding capacity both of these are significant deficits and would have a significant deleterious impact on his ability to effectively store and organize information due to significantly impaired encoding capacity.  Breaking the patient's memory functions down between auditory versus visual the patient produced an auditory working memory of 77 which falls at the 6th percentile and is in the borderline range relative to a normative population.  The patient did significantly worse on visual memory components where he produced a visual memory index score of 40 which fell below the 1st percentile and in the extreme low range.  This is a significant difference.  While both his auditory and visual memory functions were well below predicted levels and very inconsistent with the type of jobs and occupational history he has been able to effectively manage through his career the patient is doing much worse with regard to visual memory than he is with auditory memory.  Breaking the patient's memory functions down between immediate versus delayed, the patient produced an immediate memory index score of 47 which fell below the 0.1 percentile relative to a normative population.  This again is a significantly impaired score but is primarily dropped down to this degree because of his severe and significant visual memory functioning.  However in contrast to this the patient performed poorly but  did better on delayed memory index score where he produced a delayed memory index score of 64.  This is still a significantly impaired score and more than 40 points below predicted levels but does suggest that the information that he does initially learned even though quite limited is retained over a period of delay.  The patient at times improved performance under recognition and cued recall formats highlighting the fact that if the information is actually effectively encoded and stored/organized it does tend to retain itself over time.              Primary Subtest Scaled Score Summary     Subtest Domain Raw Score Scaled Score Percentile Rank  Logical Memory I AM 7 1 0.1  Logical Memory II AM 11 6 9   Verbal Paired Associates I AM 16 6 9   Verbal Paired Associates II AM 10 11 63  Designs I VM 25 1 0.1  Designs II VM 21 1 0.1  Visual Reproduction I VM 4 1 0.1  Visual Reproduction II VM 0 1 0.1  Spatial Addition VWM 4 5 5   Symbol Span VWM 11 6 9                   Auditory Memory Process Score Summary      Process Score Raw Score Scaled Score Percentile Rank Cumulative Percentage (Base Rate)  LM II Recognition 17 - - <=2%  VPA II Recognition 37 - - 26-50%                   Visual Memory Process Score  Summary      Process Score Raw Score Scaled Score Percentile Rank Cumulative Percentage (Base Rate)  DE I Content 18 2 0.4 -  DE I Spatial 5 2 0.4 -  DE II Content 19 4 2  -  DE II Spatial 2 1 0.1 -  DE II Recognition 7 - - <=2%  VR II Recognition 3 - - 10-16%            ABILITY-MEMORY ANALYSIS   Ability Score:    VCI: 87 Date of Testing:           WAIS-IV; WMS-IV 2021/09/22             Predicted Difference Method    Index Predicted WMS-IV Index Score Actual WMS-IV Index Score Difference Critical Value   Significant Difference Y/N Base Rate  Auditory Memory 93 77 16 9.00 Y 10-15%  Visual Memory 94 40 54 8.38 Y <1%  Visual Working Memory 93 73 20 10.86 Y 5-10%   Immediate Memory 92 47 45 10.12 Y <1%  Delayed Memory 93 64 29 9.95 Y 1-2%  Statistical significance (critical value) at the .01 level.      We also calculated the patient's ability/memory analysis where his current verbal comprehension index score is used to produce a predicted score on various memory indices.  This predicted score is then compared against actual achieved scores.  While the patient's verbal comprehension index is well below predicted levels of premorbid functioning is still produced estimates that were all statistically significantly higher than the actual achieved score.  The most profound difference was between his predicted score on visual memory components versus actual achieved score.   Summary of Results:   Overall, the results of the current neuropsychological evaluation are quite striking and the level of deficits noted.  However, it does appear that significant impairments for both auditory and visual encoding are playing a oversize role in many other areas assessed.  The patient had to have instructions repeatedly given to him and he had to be redirected and reminded of the task at hand on multiple occasions during the assessment.  Objective assessment of visual and auditory information were both indicative of significant deficits.  The patient also showed severe deficits with regard to focus execute abilities and information processing speed which is another component of attention and concentration.  I do think that this is the primary area of deficit and is having a significant impact on other areas of cognitive functioning.  The patient is showing profound deficits with regard to attentional variables including encoding and focus execute abilities as well as significant and profound deficits for auditory and visual memory with visual memory functions being even more significantly impaired than auditory memory.  However, the patient's day-to-day functioning would not suggest  actual memory deficits to this degree and I do think that his encoding deficits and focus execute abilities played a major role in the level of memory impairments noted.  The patient was also showing significant deficits with regard to visual spatial, visual reasoning and visual processing functions as well and difficulty effectively recalling and retrieving even well learned information.  Impression/Diagnosis:   The results of the current neuropsychological evaluation are consistent between the patient's reports and his wife reports about significant cognitive difficulties.  However, the obtained scores were significantly below and worse than the descriptions provided by the patient and his wife.  The patient was so severely impaired during the objective assessment that it would have been  very difficult for him to manage even basic ADL type functions around his house.  I do think that it is difficult to assess effectively areas outside of those primarily related to attentional deficits particularly for auditory and visual encoding and focus execute capacity.  These are so impaired that they had a significant impact on all other cognitive areas assessed.  The patient does clearly have significant cognitive deficits with significant memory and attentional deficits present.  This pattern would be consistent with postconcussion syndrome types of deficits but they are will be on what would be expected typically from a concussive event and isolation.  As far as diagnostic considerations, the patient presents with a very complicated medical and neuropsychological picture.  The patient has had multiple concussive events going back over 3 decades.  However, the most changes were noted after a significant fall in September 2021.  The patient began having seizure-like events that were consistent with complex partial type seizures.  He has responded very well to Keppra as far as reducing these events.  He would have acute  confusion, disorientation and balance issues during these times.  While neuro imaging including MRIs did not show any observable abnormalities I suspect that the patient has had some degree of diffuse axonal injury as his cognitive deficits go well beyond those typically seen with lobar white matter injury slowly.  I do, however, think that there are other issues going on that are contributing and worsening his symptoms although some of which we are not going to be able to avoid.  The patient has responded very well to Keppra and this is decreased his acute seizure/confusional events.  However, due to his anxiety and depressive symptomatology and sleep difficulties he has continued to take benzo diazepam such as Ambien, clonazepam as well as Keppra for his seizures.  The patient has longstanding sleep disturbance but was not found to have sleep apnea.  During one of his acute events at the winery he had an elevated blood glucose level of 242 which is significant although I suspect that his baseline is relatively high to begin with.  Thyroid issues, chronic insomnia, treatment of his anxiety with benzo diazepam, seizures and the focal injury or location contributing to the seizure events are all likely producing the primary complex of symptoms resulting in his subjective experiences.  The primary area of cognitive deficits around attention and focus execute abilities would suggest primary frontal lobe involvement and his deficits in the degree of other areas including temporal lobe functioning are very difficult to ascertain as his attentional deficits are so significant profound that almost all other neuropsychological measures are going to be disproportionately and significantly impacted due to his difficulty keeping instructions and directions straight during various tasks.  As far as treatment recommendations it will be important for the patient to continue to work on his depression and anxiety.  He should also  continue close monitoring on metabolic issues including his diabetes and thyroid issues and overall liver function etc.  Whenever possible I would suggest he limit his benzo diazepam use if possible as they may exacerbate some of his memory and attentional issues.  While clearly his Keppra use is not the cause of his overall cognitive dysfunction and is essential for managing his seizure activity it may also be playing some role in his cognitive issues but his attentional and memory deficits are so significant that they could not be explained by typical side effects from Keppra.  I will sit down  with the patient and go over all the results of the current neuropsychological evaluation.  We will address issues related to how to better compensate for his significant attentional issues and encoding deficits and explain how these areas of cognitive functioning are good impact other life details.  Good dietary habits, working on improving his sleep anyway we can and maintaining sustained physical activity will be important overall.  Diagnosis:    Postconcussion syndrome  Short-term memory loss  History of multiple concussions  History of seizure  Cognitive complaints  Memory loss   _____________________ Arley Phenix, Psy.D. Clinical Neuropsychologist

## 2021-09-30 NOTE — Progress Notes (Signed)
Neuropsychological Consultation   Patient:   Andrew Patton   DOB:   07/29/1961  MR Number:  [id number]  Location:  [facility] [place], [place] [id number] [place] [address] Dept: [phone]           Date of Service:   09/14/2021  Start Time:   1 PM End Time:   3 PM  Today's visit was an in person visit that was conducted in my outpatient clinic office.  1 hour was spent with the patient, his wife and myself in a formal face-to-face clinical interview and the other hour was spent with records review, report writing and setting up testing protocols.  Provider/Observer:  Andrew Patton, Psy.D.       Clinical Neuropsychologist       Billing Code/Service: 96116/96121  Reason for Service:    Andrew Patton is a 61 year old male referred by his treating neurologist Andrew Ill, MD for neuropsychological evaluation as part of the larger neurological work-up.  The patient was initially referred for neurological work-up by his treating primary care physician Andrew Pretty, MD due to concerns around memory loss and cognitive changes.  Patient has a past history of multiple concussions.  The patient has a past medical history that includes seizures, diabetes, elevated transaminasemia secondary to fatty liver, hyperlipidemia, hypothyroidism, insomnia, hypertension, obesity, cirrhosis, fatigue, osteoarthritis, diabetes with hypertension and hypercholesterolemia.  The patient has had recent MRI and EEGs conducted with overall the brain MRI within normal limits for age and EEG showing no elective form epileptiform activity.  Patient is being managed with Keppra due to seizure disorder.  The patient is described by his wife and medical records as having episodes of confusion that really developed after a concussion in July 2021.  There have been several episodes where he would not know  where he is going and the wife would make him pullover.  She described a "glazed look" in his eyes.  They both felt that the symptoms possibly started in September 2021.  When these events happen he will be very confused and disoriented lasting 5 to 10 minutes and slowly resolved.  There was a significant event in January when the patient and his wife are at a winery and arriving around 1215 and only had been there for 20 to 25 minutes.  They were sitting at a table and he was eating nuts and had not had any alcohol at that point.  The patient became confused and took his shirt off and told his wife that he was going to bed and that he wanted to go lay down.  He was confused of where he was thinking that he was at home.  They were able to get him into a chair in the office of the restaurant but he kept wanting to get up and wander.  There is no loss of consciousness and heart rate and pulse were within normal limits.  The patient could not answer basic questions asked of him by his wife such as do you know who I am.  There is no focal weakness at the time.  No speech slurring.  EMS was called with glucose 242.  The patient has had other smaller events as well.  As have his notes from his neurologist and June 2022 he reported that he was doing much better and has lost some weight.  He was doing well on seizure medications and  had no further seizure-like events.  The patient has been dealing with a lot of anxiety and depression and is seeing a psychiatrist and working on medications and therapy.  He is better managing his thyroid condition and is A1c is down.  Patient is cutting down on alcohol.  However, he was continuing to have difficulty with short-term memory issues.  During the clinical interview today, the patient and his wife reports that he continues to have difficulties with gait and walking and has experienced some loss of muscle along with his short-term memory issues.  These memory issues are described  as quite problematic and are his primary difficulty now.  He is continuing to take Barron but is experiencing what they described as a steady worsening of his memory functions.  Patient's wife is reporting some issues with worsening executive function and problem-solving issues as a baseline with acute events where he is having great difficulty figuring things out.  The patient has had 2 significant falls over the past couple of weeks when I saw him.  There was also some degree of geographic disorientation early on but some of these episodes of confusion have improved.  The patient has had multiple falls over the years dating back as far as 22.  The most recent significant fall was in 2020.  These falls included striking his head and having concussive event.  The patient's wife reports that in September 2021 she started noticing him spacing out with a glazed look on his face.  She described these as episodic in nature.  She recounted an earlier described event when they were at a winery.  When this happened they had had nothing to drink at that point.  She reports that he was fine by the time they got to the emergency department.  Patient and wife referred that their PCP had referred the patient to Dr. Jaynee Patton and he was started on Keppra.  There has been a significant reduction in these episodic events of confusion and disorientation.  There have been some brief episodes of acute confusion even recently.  The patient has a prescription for clonazepam where he takes 0.5 mg at bedtime and takes 1 during the day as needed.  He is continue to take his Keppra.  He is also taking Lexapro for mood and also takes Ambien at night for sleep.  The patient has taken BuSpar in the past but I failed to ask whether he is continuing to take that.  The patient describes his sleep pattern consisting of taking his sleeping medications and anxiety medications each night before attempting to go to sleep.  The patient reports that  he goes to bed but gets up at least 2 times each night to go to the bathroom.  The patient does have a history of snoring and the patient had a sleep study conducted that was negative for obstructive sleep apnea.  The patient had an EEG done recently any findings of abnormalities.  The patient had an MRI done with and without contrast on 09/23/2020.  This was interpreted by for Macy Mis, MD.  He reports that there was no indication of acute infarction or intracranial hemorrhage.  No abnormalities were noticed in his MRI.  There is no family history of Alzheimer's with the patient's mother was diagnosed with some form of Parkinson's and his father was as well.  The patient denies any tremor although he has had motor involvement in his symptoms.  Patient denies any visual or auditory hallucinations.  Behavioral Observation: Andrew Patton  presents as a 61 y.o.-year-old Right handed Caucasian Male who appeared his stated age. his dress was Appropriate and he was Well Groomed and his manners were Appropriate to the situation.  his participation was indicative of Appropriate and Redirectable behaviors.  There were physical disabilities noted related to gait.  he displayed an appropriate level of cooperation and motivation.     Interactions:    Active Appropriate  Attention:   abnormal and attention span appeared shorter than expected for age  Memory:   abnormal; remote memory intact, recent memory impaired  Visuo-spatial:  not examined  Speech (Volume):  normal  Speech:   normal; normal  Thought Process:  Coherent and Relevant  Though Content:  WNL; not suicidal and not homicidal  Orientation:   person, place, time/date, and situation  Judgment:   Good  Planning:   Fair  Affect:    Anxious  Mood:    Dysphoric  Insight:   Good  Intelligence:   high  Marital Status/Living: The patient was born and raised in [place] along with 1 sibling.  The patient  currently lives with his wife of 5 years and they have a 57 year old son and a 80 year old daughter.  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  The patient left this job for better opportunities.  His longest individual duration of employment was 13 years.  Hobbies and interest include cooking and travel.  Substance Use:  No concerns of substance abuse are reported.  Patient denies any alcohol or tobacco use.  Education:   Patient graduated with an AS degree in Forensic scientist from [employer] and also graduated from [employer] high school.  He always did very well in math classes.  Medical History:   Past Medical History:  Diagnosis Date   Allergy    Anxiety    Arthritis    Ascites    Diabetes (HCC)    Elevated transaminase level    Fatigue    Fatty liver    Hyperlipidemia    Hypertension    "under control", not on any blood pressure medication, has lost 60 lbs   Hypothyroidism    on synthroid   Inguinal muscle strain    Insomnia    Left knee pain    "long time ago"   Multinodular goiter    Obesity    Pneumonia    Psoriasis    Tinnitus          Patient Active Problem List   Diagnosis Date Noted   Cirrhosis of liver with ascites ([facility])    Decompensated hepatic cirrhosis ([facility])    Hyperbilirubinemia 07/02/2021   Elevated liver enzymes 07/02/2021   History of seizure 07/02/2021   Hyponatremia 07/02/2021   Hypokalemia 07/02/2021   Depression 07/02/2021   Anxiety 07/02/2021   Insomnia due to medical condition 11/28/2020   Snoring 11/28/2020   Complex partial seizures with impaired consciousness at onset [facility]) 10/27/2020   Cognitive complaints 10/27/2020   Daytime somnolence 10/27/2020   Hypothyroidism 10/27/2020   Uncontrolled type 2 diabetes mellitus with hyperglycemia ([facility]) 10/27/2020   Chronic post concussive  encephalopathy 10/27/2020   Abdominal distention    Diarrhea    Rectal bleeding 03/08/2019   Abnormal liver function 03/08/2019   Portal vein thrombosis 03/07/2019   Ascites of liver 03/07/2019   Abdominal pain 03/07/2019  Unspecified essential hypertension 04/29/2014   Other and unspecified hyperlipidemia 04/29/2014              Abuse/Trauma History: No psychological traumatic experiences are noted but the patient has had physical trauma with multiple falls and injury and recent change in financial status and impending retirement.  Psychiatric History:  Patient does have a history including anxiety difficulties.  Family Med/Psych History:  Family History  Problem Relation Age of Onset   Breast cancer Mother    Hyperlipidemia Mother    Other Mother        ? form of parkinson's    Parkinsonism Mother    Bladder Cancer Father    Parkinson's disease Father    Memory loss Maternal Grandmother        in her 63s   Birth defects Maternal Grandfather    Birth defects Paternal Grandmother    Colon cancer Neg Hx    Esophageal cancer Neg Hx    Rectal cancer Neg Hx    Stomach cancer Neg Hx    Colon polyps Neg Hx    Dementia Neg Hx    Alzheimer's disease Neg Hx     Impression/DX:  KEYNAN MATHENY is a 61 year old male referred by his treating neurologist Andrew Ill, MD for neuropsychological evaluation as part of the larger neurological work-up.  The patient was initially referred for neurological work-up by his treating primary care physician Andrew Pretty, MD due to concerns around memory loss and cognitive changes.  Patient has a past history of multiple concussions.  The patient has a past medical history that includes seizures, diabetes, elevated transaminasemia secondary to fatty liver, hyperlipidemia, hypothyroidism, insomnia, hypertension, obesity, cirrhosis, fatigue, osteoarthritis, diabetes with hypertension and hypercholesterolemia.  The patient has had recent MRI and EEGs  conducted with overall the brain MRI within normal limits for age and EEG showing no elective form epileptiform activity.  Patient is being managed with Keppra due to seizure disorder.  Disposition/Plan:  We have set the patient up for formal neuropsychological assessment as part of his larger overall neurological work-up.  We we will administer a primary neuropsychological test battery including the Wechsler Adult Intelligence Scale's and Wechsler Memory Scale.  The patient has not displayed and does not report any expressive language changes and there are not any symptoms consistent with lateralization in his findings.  Diagnosis:    Cognitive complaints  Memory loss  H/O multiple concussions  Postconcussion syndrome         Electronically Signed   _______________________ Andrew Patton, Psy.D. Clinical Neuropsychologist

## 2021-10-03 DIAGNOSIS — H40053 Ocular hypertension, bilateral: Secondary | ICD-10-CM | POA: Diagnosis not present

## 2021-10-06 DIAGNOSIS — K429 Umbilical hernia without obstruction or gangrene: Secondary | ICD-10-CM | POA: Diagnosis not present

## 2021-10-26 ENCOUNTER — Telehealth: Payer: Self-pay | Admitting: Neurology

## 2021-10-26 NOTE — Telephone Encounter (Signed)
Pt's wife, Selestino Nila; pt having dementia like symptoms. Have not been diagnosed. Pt believe there is someone in the house. Do not know what to do. Would like a call from the nurse. ? ?Informed Ms. Gibbard we would need a referral. ?

## 2021-10-26 NOTE — Telephone Encounter (Signed)
I called wife and relayed per Dr. Jaynee Eagles :She would ask him to see Dr. Pennie Banter office for a medical evaluation first, check for any metabolic/toxic/infectious etc etiologies first, UTIs, liver enzymes/ammonia for example (he has had liver problems) etc. If a medical examination is unrevealing then we can see him. I will CC Dr. Shelia Media on this email.  Wife verbalized understanding.  ? ? ?

## 2021-10-26 NOTE — Telephone Encounter (Signed)
I called wife of pt.  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 (Confusion). She said that this has be going on for 1 month.  1-2 times daily.  He is having tremory L hand, R hand not as strong, loses grips, shuffling, stooped , using cane.  Has had 4 falls.  No ETOH since 06/2021. No seizures continues on levetiracetam XR 1000mg  po daily.  Levothyroxine is 25mcg daily. Has had neuropsych intial visit, testing and due to have feedback appt 12-03-2021.  No appt with [name] at this time.  She did contact Dr. Shelia Media as well.  Please advise.  See now or after 12-03-2021?  ?

## 2021-10-29 ENCOUNTER — Ambulatory Visit: Payer: BC Managed Care – PPO | Admitting: Psychology

## 2021-10-29 DIAGNOSIS — R441 Visual hallucinations: Secondary | ICD-10-CM | POA: Diagnosis not present

## 2021-10-29 DIAGNOSIS — K7031 Alcoholic cirrhosis of liver with ascites: Secondary | ICD-10-CM | POA: Diagnosis not present

## 2021-10-29 DIAGNOSIS — E1169 Type 2 diabetes mellitus with other specified complication: Secondary | ICD-10-CM | POA: Diagnosis not present

## 2021-11-02 DIAGNOSIS — K7031 Alcoholic cirrhosis of liver with ascites: Secondary | ICD-10-CM | POA: Diagnosis not present

## 2021-11-03 DIAGNOSIS — R251 Tremor, unspecified: Secondary | ICD-10-CM | POA: Diagnosis not present

## 2021-11-03 DIAGNOSIS — K7031 Alcoholic cirrhosis of liver with ascites: Secondary | ICD-10-CM | POA: Diagnosis not present

## 2021-11-03 DIAGNOSIS — I10 Essential (primary) hypertension: Secondary | ICD-10-CM | POA: Diagnosis not present

## 2021-11-03 DIAGNOSIS — R443 Hallucinations, unspecified: Secondary | ICD-10-CM | POA: Diagnosis not present

## 2021-11-10 DIAGNOSIS — S99929A Unspecified injury of unspecified foot, initial encounter: Secondary | ICD-10-CM | POA: Diagnosis not present

## 2021-11-10 DIAGNOSIS — M79672 Pain in left foot: Secondary | ICD-10-CM | POA: Diagnosis not present

## 2021-11-10 DIAGNOSIS — M1990 Unspecified osteoarthritis, unspecified site: Secondary | ICD-10-CM | POA: Diagnosis not present

## 2021-11-10 DIAGNOSIS — L4050 Arthropathic psoriasis, unspecified: Secondary | ICD-10-CM | POA: Diagnosis not present

## 2021-11-10 DIAGNOSIS — L409 Psoriasis, unspecified: Secondary | ICD-10-CM | POA: Diagnosis not present

## 2021-11-12 DIAGNOSIS — M542 Cervicalgia: Secondary | ICD-10-CM | POA: Diagnosis not present

## 2021-11-12 DIAGNOSIS — M79672 Pain in left foot: Secondary | ICD-10-CM | POA: Diagnosis not present

## 2021-11-20 DIAGNOSIS — L400 Psoriasis vulgaris: Secondary | ICD-10-CM | POA: Diagnosis not present

## 2021-11-21 DIAGNOSIS — M542 Cervicalgia: Secondary | ICD-10-CM | POA: Diagnosis not present

## 2021-11-23 ENCOUNTER — Encounter: Payer: Self-pay | Admitting: Gastroenterology

## 2021-11-23 ENCOUNTER — Ambulatory Visit: Payer: BC Managed Care – PPO | Admitting: Gastroenterology

## 2021-11-23 VITALS — BP 104/62 | HR 63 | Ht 67.0 in | Wt 185.2 lb

## 2021-11-23 DIAGNOSIS — K766 Portal hypertension: Secondary | ICD-10-CM

## 2021-11-23 DIAGNOSIS — K7031 Alcoholic cirrhosis of liver with ascites: Secondary | ICD-10-CM | POA: Diagnosis not present

## 2021-11-23 DIAGNOSIS — K7682 Hepatic encephalopathy: Secondary | ICD-10-CM | POA: Diagnosis not present

## 2021-11-23 DIAGNOSIS — Z87898 Personal history of other specified conditions: Secondary | ICD-10-CM

## 2021-11-23 DIAGNOSIS — K3189 Other diseases of stomach and duodenum: Secondary | ICD-10-CM

## 2021-11-23 MED ORDER — FUROSEMIDE 20 MG PO TABS: 40.0000 mg | ORAL_TABLET | Freq: Every day | ORAL | 0 refills | Status: DC

## 2021-11-23 NOTE — Patient Instructions (Addendum)
If you are age 61 or younger, your body mass index should be between 19-25. Your Body mass index is 29.01 kg/m?Marland Kitchen If this is out of the aformentioned range listed, please consider follow up with your Primary Care Provider.  ? ?__________________________________________________________ ? ?The [facility] providers would like to encourage you to use [facility] to communicate with providers for non-urgent requests or questions.  Due to long hold times on the telephone, sending your provider a message by [facility] may be a faster and more efficient way to get a response.  Please allow 48 business hours for a response.  Please remember that this is for non-urgent requests.  ? ?Due to recent changes in healthcare laws, you may see the results of your imaging and laboratory studies on MyChart before your provider has had a chance to review them.  We understand that in some cases there may be results that are confusing or concerning to you. Not all laboratory results come back in the same time frame and the provider may be waiting for multiple results in order to interpret others.  Please give [name] 48 hours in order for your provider to thoroughly review all the results before contacting the office for clarification of your results.  ? ?You have been scheduled for an abdominal paracentesis at [facility] radiology (1st floor of hospital) on 11/22/21 at 900 am. Please arrive at least 30 minutes prior to your appointment time for registration. Should you need to reschedule this appointment for any reason, please call our office at [phone]. ? ?Please go to the lab on the 2nd floor [address] after 1 week from today. ? ?We have sent the following medications to your pharmacy for you to pick up at your convenience: Lasix ? ?Increase Lasix o 40 mg every day. ? ?Please follow up in 3 months. Give [name] a call at [phone] to schedule an appointment. ? ?You will be contacted by [facility]  to schedule a consult.  ? ?Thank you for  choosing me and [facility]. ? ?Doristine Locks, D.O. ? ?We want to thank you for trusting [facility] with your care. All of our staff and providers value the relationships we have built with our patients, and it is an honor to care for you.  ? ?We are writing to let you know that [facility] will close on Dec 14, 2021, and we invite you to continue to see Dr. Edman Circle and Doristine Locks at the [facility] [place] office location. We are consolidating our serices at these [facility] practices to better provide care. Our office staff will work with you to ensure a seamless transition.  ? ?Doristine Locks, DO -Dr. Barron Alvine will be movig to [facility] at [address], [place], [place] [address], effective Dec 14, 2021.  Contact [phone] to schedule an appointment with him.  ? ?Edman Circle, MD- Dr. Chales Abrahams will be movig to [facility] at [address] [place], [place] [address], effective Dec 14, 2021.  Contact [phone] to schedule an appointment with him.  ? ?Requesting Medical Records ?If you need to request your medical records, please follow the instructions below. Your medical records are confidential, and a copy can be transferred to another provider or released to you or another person you designate only with your permission. ? ?There are several ways to request your medical records: ?Requests for medical records can be submitted through our practice.   ?You can also request  your records electronically, in your MyChart account by selecting the ?Request Health Records? tab.  ?If you need additional information on how to request records, please go to [email or web address], choose Patient Information, then select Request Medical Records. ?To make an appointment or if you have any questions about your health care needs, please contact our office at [phone] and one of our staff members will be glad  to assist you. ?[facility] is committed to providing exceptional care for you and our community. Thank you for allowing [name] to serve your health care needs. ?Sincerely, ? ?Trixie Dredge, Director [facility] ?[facility] also offers convenient virtual care options. Sore throat? Sinus problems? Cold or flu symptoms? Get care from the comfort of home with [facility] Video Visits and e-Visits. Learn more about the non-emergency conditions treated and start your virtual visit at [email or web address] ? ? ?

## 2021-11-23 NOTE — Progress Notes (Signed)
? ?Chief Complaint:    Cirrhosis, abdominal bloating ? ?GI History: 61 year old male with a history of psoriatic arthritis (previously treated with Enbrel, now Cosentyx), hypertension, hyperlipidemia, and history of alcohol abuse,  diagnosed with cirrhosis in 03/2019 on hospital admission (see prior notes for summary of hospitalization work-up).  ?  ?Cirrhosis Evaluation: ?- Etiology: Steatohepatitis; 2/2 EtOH, but also RFs for NASH to include hypertension, hyperlipidemia, central adiposity/history of obesity   ?- Complications: Ascites, portal hypertensive gastropathy, hepatic encephalopathy ?- HCC screening: RUQ [name] 07/2021: Cirrhosis, no HCC.  AFP normal 08/2020 ?- Variceal screening: EGD 08/2021 (no varices) ?- Serologic evaluation: Completed 03/2019-all negative ?- Viral hepatitis vaccination: UTD; HAVAb+, HBsAb+ ?- Flu vaccine: Declines ?- Liver biopsy: None to date ?- Medications: Lasix 20 mg, Aldactone 100 mg, lactulose, Rifaxmin ?- MELD: 26 ?- Child Pugh score: C (11 points) ?  ?  ?Endoscopic history: ?- Colonoscopy (01/2013, Dr. Jarold Motto): Normal.  Repeat in 10 years ?-EGD (04/2019, Dr. Barron Alvine): Normal esophagus, moderate PHG, normal duodenum ?- EGD (08/2021, Dr. Barron Alvine): LA Grade A esophagitis, no esophageal varices, moderate portal hypertensive gastropathy, mild peptic duodenitis.  Unchanged omeprazole to Protonix ? ?HPI:   ? ? ?Patient is a 61 y.o. male presenting to the Gastroenterology Clinic for follow-up.  Last seen by me on 08/05/2021 for hospital follow-up after admission in December 2022 with decompensating cirrhosis and electrolyte abnormalities.  Reports his last drink was prior to that admission, now sober x4 months. ? ?Today, main issue is abdominal bloating/distention. ? ?Taking lactulose QOD with 3-4 BM/day. Taking Rifaxmin as prescribed.  ? ?Currently taking Lasix 20 mg/day and Aldactone 100 mg. 2 ? ?Was evaluated in the Psychology Clinic last month for neuropsychological evaluation, with  findings consistent with significant cognitive deficits with significant memory and attentional deficits present, possibly from postconcussive syndrome. ? ?Not a candidate for umbilical hernia repair due to advanced cirrhosis. ? ?Reports having labs done by [facility]; not available for review today. No new abdominal imaging for review today. ? ?Review of systems:     No chest pain, no SOB, no fevers, no urinary sx  ? ?Past Medical History:  ?Diagnosis Date  ? Allergy   ? Anxiety   ? Arthritis   ? Ascites   ? Diabetes (HCC)   ? Elevated transaminase level   ? Fatigue   ? Fatty liver   ? Hyperlipidemia   ? Hypertension   ? "under control", not on any blood pressure medication, has lost 60 lbs  ? Hypothyroidism   ? on synthroid  ? Inguinal muscle strain   ? Insomnia   ? Left knee pain   ? "long time ago"  ? Multinodular goiter   ? Obesity   ? Pneumonia   ? Psoriasis   ? Tinnitus   ? ? ?Patient's surgical history, family medical history, social history, medications and allergies were all reviewed in Epic  ? ? ?Current Outpatient Medications  ?Medication Sig Dispense Refill  ? azelastine (ASTELIN) 0.1 % nasal spray Place 1 spray into both nostrils 2 (two) times daily. Use in each nostril as directed    ? busPIRone (BUSPAR) 15 MG tablet Take 15-30 mg by mouth See admin instructions. Take 15 mg by mouth in the morning and 22.5-30 mg after supper    ? clonazePAM (KLONOPIN) 0.5 MG tablet Take 0.5 mg by mouth See admin instructions. Take 0.5 mg by mouth at bedtime and an additional 0.5 mg once a day as needed for palpitations    ?  colesevelam (WELCHOL) 625 MG tablet Take 625 mg by mouth daily with breakfast.    ? escitalopram (LEXAPRO) 10 MG tablet Take 5 mg by mouth daily.    ? ezetimibe (ZETIA) 10 MG tablet Take 10 mg by mouth daily.    ? fluticasone (FLONASE) 50 MCG/ACT nasal spray Place 1 spray into both nostrils at bedtime.    ? furosemide (LASIX) 20 MG tablet Take 1 tablet (20 mg total) by mouth daily. 30 tablet 3  ?  ipratropium (ATROVENT) 0.06 % nasal spray Place 1 spray into the nose daily.    ? lactulose (CHRONULAC) 10 GM/15ML solution Take 15 mLs (10 g total) by mouth 2 (two) times daily as needed for mild constipation (for bowel movments 2-3 per day).    ? levETIRAcetam (KEPPRA XR) 500 MG 24 hr tablet Take 2 tablets (1,000 mg total) by mouth daily. (Patient taking differently: Take 1,000 mg by mouth daily after supper.) 180 tablet 3  ? levocetirizine (XYZAL) 5 MG tablet Take 5 mg by mouth every evening.    ? Levothyroxine Sodium 88 MCG CAPS Take 88 mcg by mouth daily before breakfast.    ? ondansetron (ZOFRAN ODT) 4 MG disintegrating tablet Take 1 tablet (4 mg total) by mouth every 8 (eight) hours as needed for nausea or vomiting. (Patient taking differently: Take 4 mg by mouth every 8 (eight) hours as needed for nausea or vomiting (dissolve orally).) 6 tablet 0  ? pantoprazole (PROTONIX) 20 MG tablet Take 1 tablet (20 mg total) by mouth daily. 30 tablet 11  ? sildenafil (REVATIO) 20 MG tablet Take 1 tablet by mouth as needed.    ? spironolactone (ALDACTONE) 100 MG tablet Take 1 tablet (100 mg total) by mouth daily. 30 tablet 5  ? thiamine 100 MG tablet Take 1 tablet (100 mg total) by mouth daily. 90 tablet 0  ? traMADol (ULTRAM) 50 MG tablet Take 25 mg by mouth 3 (three) times daily as needed.    ? zolpidem (AMBIEN) 5 MG tablet Take 5 mg by mouth at bedtime.    ? rifaximin (XIFAXAN) 550 MG TABS tablet Take 1 tablet (550 mg total) by mouth 2 (two) times daily. (Patient not taking: Reported on 08/11/2021) 60 tablet 11  ? ?No current facility-administered medications for this visit.  ? ? ?Physical Exam:   ? ? ?BP 104/62 (BP Location: Left Arm, Patient Position: Sitting, Cuff Size: Normal)   Pulse 63   Ht 5\' 7"  (1.702 m)   Wt 185 lb 4 oz (84 kg)   SpO2 98%   BMI 29.01 kg/m?  ? ?GENERAL:  Pleasant male in NAD ?PSYCH: : Cooperative, normal affect ?EENT: Icteric sclera, mucous membranes moist ?ABDOMEN: Distended but not  tender.  Caput medusa day and umbilical hernia present.  ?SKIN: Jaundiced complexion.   ?NEURO: Alert and oriented x 3, no asterixis ? ? ?IMPRESSION and PLAN:   ? ?1) EtOH cirrhosis ?2) Ascites ?3) Portal hypertensive gastropathy ?4) Hepatic encephalopathy ?5) Jaundice ?6) Abdominal bloating ? ?- Has been abstinent of all EtOH since 06/2021 ?- Check PEth lab ?- Increase Lasix to 40 mg ?- Send to IR for ultrasound and therapeutic paracentesis ?- Check CHEM panel in 1 week with diuretic change ?- Check CMP, PT/INR next week for recalculation of MELD ?- [facility] ?- Continue rifaximin.  Wife will go home and review medications ensure he is still taking this ?- Continue lactulose with goal of 3-5 soft stools daily ?- MRI  liver in June for continued [facility] screening ?- UTD on variceal screening ?- Applauded him on his continued abstinence of all EtOH ? ?- RTC in 3 months or sooner prn ? ?I spent 45 minutes of time, including in depth chart review, independent review of results as outlined above, communicating results with the patient directly, face-to-face time with the patient, coordinating care, ordering studies and medications as appropriate, and documentation. ? ? ?Verlin Dike Dianely Krehbiel ,DO, FACG 11/23/2021, 10:00 AM ? ?

## 2021-11-25 ENCOUNTER — Ambulatory Visit (HOSPITAL_COMMUNITY)
Admission: RE | Admit: 2021-11-25 | Discharge: 2021-11-25 | Disposition: A | Discharge status: Home / Self Care | Payer: BC Managed Care – PPO | Source: Ambulatory Visit | Attending: Gastroenterology | Admitting: Gastroenterology

## 2021-11-25 DIAGNOSIS — F5104 Psychophysiologic insomnia: Secondary | ICD-10-CM | POA: Diagnosis not present

## 2021-11-25 DIAGNOSIS — R188 Other ascites: Secondary | ICD-10-CM | POA: Diagnosis not present

## 2021-11-25 DIAGNOSIS — K7031 Alcoholic cirrhosis of liver with ascites: Secondary | ICD-10-CM | POA: Diagnosis not present

## 2021-11-25 HISTORY — PX: IR PARACENTESIS: IMG2679

## 2021-11-25 LAB — PROTEIN, PLEURAL OR PERITONEAL FLUID: Total protein, fluid: <3 g/dL

## 2021-11-25 LAB — GRAM STAIN

## 2021-11-25 LAB — ALBUMIN, PLEURAL OR PERITONEAL FLUID: Albumin, Fluid: <1.5 g/dL

## 2021-11-25 MED ORDER — LIDOCAINE HCL (PF) 1 % IJ SOLN
INTRAMUSCULAR | Status: DC | PRN
  Administered 2021-11-25: 10 mL

## 2021-11-25 MED ORDER — LIDOCAINE HCL 1 % IJ SOLN
INTRAMUSCULAR | Status: AC
  Filled 2021-11-25: qty 20

## 2021-11-25 NOTE — Procedures (Signed)
PROCEDURE SUMMARY: ? ?Successful US guided paracentesis from left lateral abdomen.  ?Yielded 5 liters of clear yellow fluid.  ?No immediate complications.  ?Patient tolerated well.  ?EBL = trace ? ?Specimen was sent for labs. ? ?Gwynneth Macleod PA-C ?11/25/2021 ?10:09 AM ? ? ? ?

## 2021-11-26 ENCOUNTER — Encounter: Payer: Self-pay | Admitting: Gastroenterology

## 2021-11-26 LAB — CYTOLOGY - NON PAP

## 2021-11-27 NOTE — Telephone Encounter (Signed)
Nothing needs to be done for this benign finding.  These are reactive inflammatory cells.  This does not denote cancer or other worrisome features. ?

## 2021-11-30 ENCOUNTER — Other Ambulatory Visit: Payer: Self-pay

## 2021-11-30 ENCOUNTER — Other Ambulatory Visit (INDEPENDENT_AMBULATORY_CARE_PROVIDER_SITE_OTHER): Payer: BC Managed Care – PPO

## 2021-11-30 DIAGNOSIS — K7031 Alcoholic cirrhosis of liver with ascites: Secondary | ICD-10-CM

## 2021-11-30 DIAGNOSIS — Z7682 Awaiting organ transplant status: Secondary | ICD-10-CM | POA: Diagnosis not present

## 2021-11-30 DIAGNOSIS — R188 Other ascites: Secondary | ICD-10-CM

## 2021-11-30 DIAGNOSIS — M542 Cervicalgia: Secondary | ICD-10-CM | POA: Diagnosis not present

## 2021-11-30 LAB — CULTURE, BODY FLUID W GRAM STAIN -BOTTLE: Culture: NO GROWTH

## 2021-11-30 LAB — COMPREHENSIVE METABOLIC PANEL
ALT: 24 U/L (ref 0–53)
AST: 34 U/L (ref 0–37)
Albumin: 3.1 g/dL — ABNORMAL LOW (ref 3.5–5.2)
Alkaline Phosphatase: 78 U/L (ref 39–117)
BUN: 7 mg/dL (ref 6–23)
CO2: 25 mEq/L (ref 19–32)
Calcium: 8.2 mg/dL — ABNORMAL LOW (ref 8.4–10.5)
Chloride: 98 mEq/L (ref 96–112)
Creatinine, Ser: 0.76 mg/dL (ref 0.40–1.50)
GFR: 97.75 mL/min (ref >60.00)
Glucose, Bld: 99 mg/dL (ref 70–99)
Potassium: 4.2 mEq/L (ref 3.5–5.1)
Sodium: 130 mEq/L — ABNORMAL LOW (ref 135–145)
Total Bilirubin: 5.4 mg/dL — ABNORMAL HIGH (ref 0.2–1.2)
Total Protein: 5.9 g/dL — ABNORMAL LOW (ref 6.0–8.3)

## 2021-11-30 LAB — PROTIME-INR
INR: 1.6 ratio — ABNORMAL HIGH (ref 0.8–1.0)
Prothrombin Time: 17.4 s — ABNORMAL HIGH (ref 9.6–13.1)

## 2021-11-30 NOTE — Telephone Encounter (Signed)
I spoke with [name] by phone today.  Abdomen had improved after paracentesis last week, but again accumulating fluid with taut abdomen.  Plan as follows: ? ?- Referral to IR for removal of 6 L.  Give 25% albumin at 6 g/liter removed.  Send fluid for cell count, Gram stain, culture.  Please avoid scheduling on Thursday due to appointment already scheduled with Neuropsychology ?- Repeat BMP at the end of the week or early next week ?- Discussed potentially increasing Lasix and Aldactone to 80 mg / 150 mg, respectively depending on amount of fluid removed ?- Counseled patient regarding anticipated trip next month to [place].  If he continues requiring paracentesis and medication adjustments, recommend canceling trip, and she understands ? ?All questions answered and appreciative of phone call ?

## 2021-11-30 NOTE — Telephone Encounter (Signed)
Spoke with wife and let her know Dr. Frankey Shown recommendations about blood work. Also let wife know that I would send her message about pt's visit with Dr. Juliene Pina at [facility] to Dr. Barron Alvine.  ?

## 2021-11-30 NOTE — Telephone Encounter (Signed)
Avoid NSAIDs.  Okay to take Tylenol less than 2 g/day. ?

## 2021-11-30 NOTE — Telephone Encounter (Signed)
The soreness could be from reexpansion of his diaphragm after removal of 5 L of fluid.  Has he increased his Lasix to 40 mg/day?  Should also still be taking Aldactone 100 mg/day.  Lets give this a couple of days.  If he still feels like he has more fluid in his abdomen, plan to schedule repeat ultrasound with paracentesis with 5 L fluid removal and 25% albumin with IR next week.  Should be going for lab draw 7 days after increasing his Lasix to 40 mg, which hopefully is this week. ?

## 2021-11-30 NOTE — Telephone Encounter (Signed)
Paracentesis scheduled for 5/3 at 10 am. Spoke with pt's wife to let her know date and time. Also let wife know about bmp and wife stated that pt just went to get labs today that were ordered at last office appt and wanted to know if Dr. Barron Alvine still wanted him to get BMP at end of the week.  ?

## 2021-12-01 ENCOUNTER — Encounter: Payer: Self-pay | Admitting: Neurology

## 2021-12-01 ENCOUNTER — Encounter: Payer: Self-pay | Admitting: Gastroenterology

## 2021-12-01 DIAGNOSIS — R748 Abnormal levels of other serum enzymes: Secondary | ICD-10-CM

## 2021-12-01 DIAGNOSIS — K3189 Other diseases of stomach and duodenum: Secondary | ICD-10-CM

## 2021-12-01 DIAGNOSIS — K2970 Gastritis, unspecified, without bleeding: Secondary | ICD-10-CM

## 2021-12-01 DIAGNOSIS — K7460 Unspecified cirrhosis of liver: Secondary | ICD-10-CM

## 2021-12-02 ENCOUNTER — Ambulatory Visit (HOSPITAL_COMMUNITY)
Admission: RE | Admit: 2021-12-02 | Discharge: 2021-12-02 | Disposition: A | Discharge status: Home / Self Care | Payer: BC Managed Care – PPO | Source: Ambulatory Visit | Attending: Gastroenterology | Admitting: Gastroenterology

## 2021-12-02 DIAGNOSIS — K7460 Unspecified cirrhosis of liver: Secondary | ICD-10-CM

## 2021-12-02 DIAGNOSIS — R188 Other ascites: Secondary | ICD-10-CM | POA: Diagnosis not present

## 2021-12-02 DIAGNOSIS — K7031 Alcoholic cirrhosis of liver with ascites: Secondary | ICD-10-CM | POA: Insufficient documentation

## 2021-12-02 HISTORY — PX: IR PARACENTESIS: IMG2679

## 2021-12-02 LAB — BODY FLUID CELL COUNT WITH DIFFERENTIAL
Eos, Fluid: 0 %
Lymphs, Fluid: 39 %
Monocyte-Macrophage-Serous Fluid: 57 % (ref 50–90)
Neutrophil Count, Fluid: 4 % (ref 0–25)
Total Nucleated Cell Count, Fluid: 185 cu mm (ref 0–1000)

## 2021-12-02 LAB — GRAM STAIN: Gram Stain: NONE SEEN

## 2021-12-02 MED ORDER — LIDOCAINE HCL 1 % IJ SOLN
INTRAMUSCULAR | Status: AC
  Filled 2021-12-02: qty 20

## 2021-12-02 MED ORDER — LIDOCAINE HCL (PF) 1 % IJ SOLN
INTRAMUSCULAR | Status: DC | PRN
  Administered 2021-12-02: 5 mL

## 2021-12-02 NOTE — Procedures (Addendum)
PROCEDURE SUMMARY: ? ?Successful ultrasound guided paracentesis from the right lower quadrant.  ?Yielded 4.9 L of clear yellow fluid.  ?No immediate complications.  ?The patient tolerated the procedure well.  ? ?Specimen was sent for labs. ? ?EBL < 5 mL ? ?The patient has required >/=2 paracenteses in a 30 day period and a screening evaluation by the [facility] has been arranged. ? ?Candiss Norse, PA-C ?  ?

## 2021-12-02 NOTE — Progress Notes (Signed)
? ?  Portal Hypertension Clinic Screening Evaluation ? ? ?Indication for evaluation: ?Andrew Patton is a 61 y.o. male undergoing preliminary evaluation in the [facility] Interventional Radiology Portal Hypertension Clinic due to recurrent ascites. ? ?Referring Physician/Established Gastroenterologist: ? ?Etiology of cirrhosis: EtOH/NASH ?Initially diagnosed: 03/2019 ?# of paracentesis in last month: 2 ?# of paracentesis in last 2 months: 2 ?History of hepatic hydrothorax:  no ?History of hepatic encephalopathy: no ? ?Prior evaluation for liver transplant: referral made ([facility]) ?History of hepatocellular carcinoma: no ? ?Prior esophagogastroduodenoscopy/intervention: 08/11/21 - moderate portal gastropathy ?Current esophageal varices: no ?Current gastric varices: no ?History of hematemesis: no ? ?Current diuretic regimen: furosemide 40 mg QD, spironolactone 100 mg QD ?Current pharmacologic encephalopathy prophylaxis/treatment: lactulose 20 g QD, rifaximin 550 mg BID ? ?History of renal dysfunction: no ?History of hemodialysis: no ? ?History of cardiac dysfunction: no ? ?Other pertinent past medical history: Psoriasis, anxiety, diabetes, hypothyroidism, hyperlipidemia, hypertension ? ? ?Imaging: ?Prior cross sectional imaging of portal system: ?07/02/21 ? ?Patent portal system, no varices, anatomy amenable to TIPS creation ? ?Echocardiogram: None ? ? ?Labs: ?11/30/21 ?Creatinine: 0.76 ?Total Bilirubin: 5.4 ?INR: 1.6 ?Sodium: 130 ?Albumin: 3.1 ? ?Child-Pugh = 10 points, class C ?MELD = 18 (6% estimated 3 month mortality) ?[place] Index of Post-TIPS Survival (FIPS) = 0.75 (Overall survival estimated at 1 month 90.8%, 3 months 73.8%, and 6 months 64.2%) ? ? ? ?Assessment: ?Andrew Patton is a 61 y.o. male with history of EtOH/NASH cirrhosis (Child Pugh C, MELD 18) with recurrent ascites.  After preliminary evaluation, this patient is a possible TIPS candidate in the future, as up-titration of diuretics is currently  underway.  If diuretics max out with unchanged, truly refractory ascites, TIPS could be considered.   ? ?Recommendation: ?Formal consult for TIPS could be considered if the patient fails maximization of diuresis in relation to recurrent ascites.  Otherwise, IR Portal HTN clinic will follow up in 6 months. ? ? ? ?Electronically Signed: ?Bennie Dallas, MD ?12/02/2021, 1:02 PM ? ? ? ?

## 2021-12-03 ENCOUNTER — Encounter: Payer: BC Managed Care – PPO | Attending: Psychology | Admitting: Psychology

## 2021-12-03 DIAGNOSIS — R41840 Attention and concentration deficit: Secondary | ICD-10-CM | POA: Diagnosis not present

## 2021-12-03 DIAGNOSIS — Z87898 Personal history of other specified conditions: Secondary | ICD-10-CM | POA: Insufficient documentation

## 2021-12-03 DIAGNOSIS — Z87820 Personal history of traumatic brain injury: Secondary | ICD-10-CM | POA: Insufficient documentation

## 2021-12-03 DIAGNOSIS — F0781 Postconcussional syndrome: Secondary | ICD-10-CM | POA: Diagnosis not present

## 2021-12-03 DIAGNOSIS — R4189 Other symptoms and signs involving cognitive functions and awareness: Secondary | ICD-10-CM

## 2021-12-03 DIAGNOSIS — F32A Depression, unspecified: Secondary | ICD-10-CM | POA: Diagnosis not present

## 2021-12-03 DIAGNOSIS — F419 Anxiety disorder, unspecified: Secondary | ICD-10-CM | POA: Diagnosis not present

## 2021-12-03 DIAGNOSIS — R413 Other amnesia: Secondary | ICD-10-CM | POA: Insufficient documentation

## 2021-12-03 DIAGNOSIS — R419 Unspecified symptoms and signs involving cognitive functions and awareness: Secondary | ICD-10-CM | POA: Diagnosis not present

## 2021-12-03 LAB — PATHOLOGIST SMEAR REVIEW

## 2021-12-03 NOTE — Telephone Encounter (Signed)
Spoke to wife of pt.  Made appt for f/u neuropsych feedback 12-03-2021 and address tremors, ok per Dr. Jaynee Eagles.  Made appt 11-30-2021 at 1000 arrive 0930.   ?

## 2021-12-04 ENCOUNTER — Telehealth: Payer: Self-pay

## 2021-12-04 NOTE — Telephone Encounter (Signed)
-----   Message from Orion Modest, RN sent at 11/30/2021  2:15 PM EDT ----- ?Regarding: labs ?Pt needs bmp. Order in epic.  ? ?

## 2021-12-04 NOTE — Telephone Encounter (Signed)
Spoke with pt's wife and let her know that pt is due for bmp. Pt's wife stated pt would go on Wednesday.  ?

## 2021-12-06 LAB — PHOSPHATIDYLETHANOL (PETH)
Phosphatidylethanol (PEth): NEGATIVE ng/mL
Phosphatidylethanol: NEGATIVE

## 2021-12-07 LAB — CULTURE, BODY FLUID W GRAM STAIN -BOTTLE: Culture: NO GROWTH

## 2021-12-08 ENCOUNTER — Other Ambulatory Visit: Payer: Self-pay

## 2021-12-08 DIAGNOSIS — K7460 Unspecified cirrhosis of liver: Secondary | ICD-10-CM

## 2021-12-08 DIAGNOSIS — K3189 Other diseases of stomach and duodenum: Secondary | ICD-10-CM

## 2021-12-08 MED ORDER — FUROSEMIDE 20 MG PO TABS: 40.0000 mg | ORAL_TABLET | Freq: Every day | ORAL | 0 refills | Status: DC

## 2021-12-08 MED ORDER — PANTOPRAZOLE SODIUM 20 MG PO TBEC: 20.0000 mg | DELAYED_RELEASE_TABLET | Freq: Two times a day (BID) | ORAL | 3 refills | Status: DC

## 2021-12-08 MED ORDER — SPIRONOLACTONE 100 MG PO TABS: 100.0000 mg | ORAL_TABLET | Freq: Every day | ORAL | 0 refills | Status: DC

## 2021-12-08 MED ORDER — SPIRONOLACTONE 50 MG PO TABS: 50.0000 mg | ORAL_TABLET | Freq: Every day | ORAL | 0 refills | Status: DC

## 2021-12-08 NOTE — Addendum Note (Signed)
Addended by: Coletta Memos on: 12/08/2021 10:35 AM ? ? Modules accepted: Orders ? ?

## 2021-12-08 NOTE — Telephone Encounter (Signed)
Ok to increase pantoprazole to twice daily x4 weeks, then hopefully can bring back down to daily dosing ? ?Continue furosemide at 40 mg/day (this was recently increased) ? ?Given need for repeat paracentesis last week with 5 L removed, increase spironolactone to 150 mg/day with repeat BMP in 7 days.  If BMP stable but continues to accumulate fluid, will increase furosemide to 80 mg/day and send formal consultation to portal hypertension clinic (IR). ?

## 2021-12-09 ENCOUNTER — Other Ambulatory Visit (INDEPENDENT_AMBULATORY_CARE_PROVIDER_SITE_OTHER): Payer: BC Managed Care – PPO

## 2021-12-09 DIAGNOSIS — K7460 Unspecified cirrhosis of liver: Secondary | ICD-10-CM

## 2021-12-09 DIAGNOSIS — R188 Other ascites: Secondary | ICD-10-CM

## 2021-12-09 DIAGNOSIS — K766 Portal hypertension: Secondary | ICD-10-CM

## 2021-12-09 DIAGNOSIS — K3189 Other diseases of stomach and duodenum: Secondary | ICD-10-CM | POA: Diagnosis not present

## 2021-12-09 LAB — BASIC METABOLIC PANEL
BUN: 12 mg/dL (ref 6–23)
CO2: 23 mEq/L (ref 19–32)
Calcium: 8.4 mg/dL (ref 8.4–10.5)
Chloride: 98 mEq/L (ref 96–112)
Creatinine, Ser: 0.9 mg/dL (ref 0.40–1.50)
GFR: 92.87 mL/min (ref >60.00)
Glucose, Bld: 95 mg/dL (ref 70–99)
Potassium: 3.9 mEq/L (ref 3.5–5.1)
Sodium: 130 mEq/L — ABNORMAL LOW (ref 135–145)

## 2021-12-10 ENCOUNTER — Ambulatory Visit: Payer: BC Managed Care – PPO | Admitting: Neurology

## 2021-12-10 ENCOUNTER — Encounter: Payer: Self-pay | Admitting: Neurology

## 2021-12-10 VITALS — BP 108/58 | HR 72 | Ht 66.5 in | Wt 178.2 lb

## 2021-12-10 DIAGNOSIS — F0781 Postconcussional syndrome: Secondary | ICD-10-CM

## 2021-12-10 DIAGNOSIS — Z87820 Personal history of traumatic brain injury: Secondary | ICD-10-CM

## 2021-12-10 DIAGNOSIS — R4189 Other symptoms and signs involving cognitive functions and awareness: Secondary | ICD-10-CM | POA: Diagnosis not present

## 2021-12-10 DIAGNOSIS — E039 Hypothyroidism, unspecified: Secondary | ICD-10-CM

## 2021-12-10 DIAGNOSIS — G40209 Localization-related (focal) (partial) symptomatic epilepsy and epileptic syndromes with complex partial seizures, not intractable, without status epilepticus: Secondary | ICD-10-CM | POA: Diagnosis not present

## 2021-12-10 DIAGNOSIS — K7031 Alcoholic cirrhosis of liver with ascites: Secondary | ICD-10-CM

## 2021-12-10 MED ORDER — LEVETIRACETAM ER 500 MG PO TB24: 1000.0000 mg | ORAL_TABLET | Freq: Every day | ORAL | 3 refills | Status: DC

## 2021-12-10 MED ORDER — ESCITALOPRAM OXALATE 10 MG PO TABS: 5.0000 mg | ORAL_TABLET | Freq: Every day | ORAL | 3 refills | Status: DC

## 2021-12-10 NOTE — Patient Instructions (Signed)
Continue medications

## 2021-12-10 NOTE — Progress Notes (Signed)
?[facility] ? ? ? ?Provider:  Dr Lucia GaskinsAhern ?Requesting Provider: [name], Walter, MD ?Primary Care Provider:  [name], Walter, MD ? ?CC:  Memory loss, seizures, concussions ? ?Dec 10, 2021: Patient is here for follow-up after formal neurocognitive testing per Dr. Tamsen Snideroddenbough.  I reviewed his notes as below.  Luckily there is no indication of Alzheimer's or Lewy body dementia or other neurodegenerative dementia.  However he does have significant cognitive deficits.  We discussed conservative measures and most of all managing his medical conditions which she is doing very well.  I answered all the questions as to the best of my ability.  He was here with his wife who also provided much information.  Discussed returning to primary care, patient and wife would like to continue following with us, we will continue to follow him every 6 months for the next year to ensure his stability. ? ?Dr. Kieth Brightlyodenbough: ?Impression/Diagnosis:                     The results of the current neuropsychological evaluation are consistent between the patient's reports and his wife reports about significant cognitive difficulties.  However, the obtained scores were significantly below and worse than the descriptions provided by the patient and his wife.  The patient was so severely impaired during the objective assessment that it would have been very difficult for him to manage even basic ADL type functions around his house.  I do think that it is difficult to assess effectively areas outside of those primarily related to attentional deficits particularly for auditory and visual encoding and focus execute capacity.  These are so impaired that they had a significant impact on all other cognitive areas assessed.  The patient does clearly have significant cognitive deficits with significant memory and attentional deficits present.  This pattern would be consistent with postconcussion syndrome types of deficits but they are will be on  what would be expected typically from a concussive event and isolation. ? ?As far as diagnostic considerations, the patient presents with a very complicated medical and neuropsychological picture.  The patient has had multiple concussive events going back over 3 decades.  However, the most changes were noted after a significant fall in September 2021.  The patient began having seizure-like events that were consistent with complex partial type seizures.  He has responded very well to Keppra as far as reducing these events.  He would have acute confusion, disorientation and balance issues during these times.  While neuro imaging including MRIs did not show any observable abnormalities I suspect that the patient has had some degree of diffuse axonal injury as his cognitive deficits go well beyond those typically seen with lobar white matter injury slowly.  I do, however, think that there are other issues going on that are contributing and worsening his symptoms although some of which we are not going to be able to avoid.  The patient has responded very well to Keppra and this is decreased his acute seizure/confusional events.  However, due to his anxiety and depressive symptomatology and sleep difficulties he has continued to take benzo diazepam such as Ambien, clonazepam as well as Keppra for his seizures.  The patient has longstanding sleep disturbance but was not found to have sleep apnea.  During one of his acute events at the winery he had an elevated blood glucose level of 242 which is significant although I suspect that his baseline is relatively high to begin with.  Thyroid issues, chronic  insomnia, treatment of his anxiety with benzo diazepam, seizures and the focal injury or location contributing to the seizure events are all likely producing the primary complex of symptoms resulting in his subjective experiences.  The primary area of cognitive deficits around attention and focus execute abilities would  suggest primary frontal lobe involvement and his deficits in the degree of other areas including temporal lobe functioning are very difficult to ascertain as his attentional deficits are so significant profound that almost all other neuropsychological measures are going to be disproportionately and significantly impacted due to his difficulty keeping instructions and directions straight during various tasks. ? ?As far as treatment recommendations it will be important for the patient to continue to work on his depression and anxiety.  He should also continue close monitoring on metabolic issues including his diabetes and thyroid issues and overall liver function etc.  Whenever possible I would suggest he limit his benzo diazepam use if possible as they may exacerbate some of his memory and attentional issues.  While clearly his Keppra use is not the cause of his overall cognitive dysfunction and is essential for managing his seizure activity it may also be playing some role in his cognitive issues but his attentional and memory deficits are so significant that they could not be explained by typical side effects from Keppra. ?  ?I will sit down with the patient and go over all the results of the current neuropsychological evaluation.  We will address issues related to how to better compensate for his significant attentional issues and encoding deficits and explain how these areas of cognitive functioning are good impact other life details.  Good dietary habits, working on improving his sleep anyway we can and maintaining sustained physical activity will be important overall. ?  ?Diagnosis:                              ?  ?Postconcussion syndrome ?  ?Short-term memory loss ?  ?History of multiple concussions ?  ?History of seizure ?  ?Cognitive complaints ?  ?Memory loss ? ?Patient complains of symptoms per HPI as well as the following symptoms: chronic postconcussive syndrome . Pertinent negatives and positives per  HPI. All others negative ? ? ?01/29/2021: He is feeling much,much better. He has lost weight. He is doing well with seizure medication and no seizures. He is dealing with a lot of anxiety and depression and he is seeing [name] working with medications and therapy. He is managing his thyroid. He has lost a lot of weight, his hgba1c is down, he is cutting down on alcohol, he has occ drinks but not like it was before. He is doing better. Right now he fights short-term memory issues. He is here with his sister today (I treated sister's husband inpatient for stroke in 2020).  ? ?Interval history 10/27/2020: MRI of the brain was normal for age. EEG showed no epileptiform activity. On Keppra. They have adjusted his thyroid medication and his hgba1c. He snores at night. He does have a deviated septum. He does not sleep well at night. He wakes frequently. He sees an ENT. No moe episodes on the Keppra. But he is very tired. He is closing his eyes dyring the appointment. He does snore, his ESS is 10. Being on the keppra has caused him a lot of zoning out. The medication makes him very tired. And then he wakes up more. When he started the medication  he was a zombie.They are scheduled to go to [place] in July. He naps during the day.I would supposrt short-term disability, EEG was slowed. 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 It can be very dangerous. He has sensitivity to light, it is worse, since the concussion. He has had eye exams.  ? ?HPI:  TAKESHI TEASDALE is a 61 y.o. male here as requested by [name], MD([facility]) for memory loss.  Past medical history of diabetes, elevated transaminases secondary to fatty liver, hyperlipidemia, hypothyroidism, insomnia, hypertension, obesity, psoriasis, fatigue, osteoarthritis, diabetes with hypertension and hypercholesterolemia.  I requested his pcp's notes from Dr. Renne Crigler as well.  ? ?Patient is here with his wife who provides most  information, he is having episodes of confusion, he had a concussion in July 2021 and since then he had several episodes where he would not know where he is going, wife would make him pull over. A "glazed look" i

## 2021-12-11 ENCOUNTER — Other Ambulatory Visit: Payer: Self-pay

## 2021-12-14 ENCOUNTER — Other Ambulatory Visit: Payer: Self-pay

## 2021-12-14 DIAGNOSIS — K7031 Alcoholic cirrhosis of liver with ascites: Secondary | ICD-10-CM

## 2021-12-14 MED ORDER — FUROSEMIDE 80 MG PO TABS: 80.0000 mg | ORAL_TABLET | Freq: Every day | ORAL | 3 refills | Status: DC

## 2021-12-15 ENCOUNTER — Other Ambulatory Visit: Payer: Self-pay

## 2021-12-15 DIAGNOSIS — K7031 Alcoholic cirrhosis of liver with ascites: Secondary | ICD-10-CM

## 2021-12-15 MED ORDER — RIFAXIMIN 550 MG PO TABS: 550.0000 mg | ORAL_TABLET | Freq: Two times a day (BID) | ORAL | 3 refills | Status: DC

## 2021-12-17 ENCOUNTER — Ambulatory Visit (HOSPITAL_COMMUNITY)
Admission: RE | Admit: 2021-12-17 | Discharge: 2021-12-17 | Disposition: A | Discharge status: Home / Self Care | Payer: BC Managed Care – PPO | Source: Ambulatory Visit | Attending: Gastroenterology | Admitting: Gastroenterology

## 2021-12-17 ENCOUNTER — Ambulatory Visit
Admission: RE | Admit: 2021-12-17 | Discharge: 2021-12-17 | Disposition: A | Discharge status: Home / Self Care | Payer: BC Managed Care – PPO | Source: Ambulatory Visit | Attending: Gastroenterology | Admitting: Gastroenterology

## 2021-12-17 DIAGNOSIS — R188 Other ascites: Secondary | ICD-10-CM | POA: Diagnosis not present

## 2021-12-17 DIAGNOSIS — K7031 Alcoholic cirrhosis of liver with ascites: Secondary | ICD-10-CM

## 2021-12-17 HISTORY — PX: IR PARACENTESIS: IMG2679

## 2021-12-17 HISTORY — PX: IR RADIOLOGIST EVAL & MGMT: IMG5224

## 2021-12-17 LAB — BODY FLUID CELL COUNT WITH DIFFERENTIAL
Lymphs, Fluid: 72 %
Monocyte-Macrophage-Serous Fluid: 24 % — ABNORMAL LOW (ref 50–90)
Neutrophil Count, Fluid: 4 % (ref 0–25)
Total Nucleated Cell Count, Fluid: 117 cu mm (ref 0–1000)

## 2021-12-17 LAB — GRAM STAIN

## 2021-12-17 MED ORDER — ALBUMIN HUMAN 25 % IV SOLN
50.0000 g | Freq: Once | INTRAVENOUS | Status: AC
  Administered 2021-12-17: 50 g via INTRAVENOUS
  Filled 2021-12-17: qty 200

## 2021-12-17 MED ORDER — ALBUMIN HUMAN 25 % IV SOLN
INTRAVENOUS | Status: AC
  Filled 2021-12-17: qty 200

## 2021-12-17 MED ORDER — LIDOCAINE HCL 1 % IJ SOLN
INTRAMUSCULAR | Status: AC
  Filled 2021-12-17: qty 20

## 2021-12-17 NOTE — Consult Note (Signed)
Chief Complaint: Patient was seen in consultation today for recurrent ascites via virtual telephone visit  Referring Physician(s): Cirigliano,Vito V   History of Present Illness: Andrew Patton is a 61 y.o. male with history of EtOH/NASH cirrhosis initially diagnosed in 2020.  He has had 3 paracenteses in the past month: 4/26 - 5 L 5/3 - 5 L 5/18 - 6 L   Dr. Bryan Lemma increased furosemide from 40 to 80 mg QD and spironolactone from 100 to 150 mg QD on 12/08/21, which was just started 2 days ago.  He states his urine output has increased and become more clear.  No history of hepatic hydrothorax or encephalopathy.  He is currently on lactulose and rifaximin.  He has been referred to the [facility] which is set for late July.  He underwent EGD on 08/11/21 which demonstrated moderate portal gastropathy.  No prior endoscopic intervention, history of gastroesophageal varices, hematemesis, or blood in stool.  He has normal renal and cardiac function.  Other pertinent past medical history includes psoriasis, anxiety, diabetes, hypothyroidism, hyperlipidemia, hypertension.  On June 8 he is leaving to travel [place] for a month with his family, a trip he has had planned for two years.  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    Past Medical History:  Diagnosis Date   Allergy    Anxiety    Arthritis    Ascites    Diabetes (HCC)    Elevated transaminase level    Fatigue    Fatty liver    Hyperlipidemia    Hypertension    "under control", not on any blood pressure medication, has lost 60 lbs   Hypothyroidism    on synthroid   Inguinal muscle strain    Insomnia    Left knee pain    "long time ago"   Multinodular goiter    Obesity    Pneumonia    Psoriasis    Tinnitus     Past Surgical History:  Procedure Laterality Date   APPENDECTOMY  1976   FOOT SURGERY Bilateral 1975   IR PARACENTESIS  11/25/2021   IR PARACENTESIS  12/02/2021   NOSE SURGERY     ROTATOR CUFF REPAIR Left     left   SHOULDER SURGERY Right 12/20/2019   [facility]     Allergies: Crestor [rosuvastatin calcium], Meloxicam, Rosuvastatin, and Penicillins  Medications: Prior to Admission medications   Medication Sig Start Date End Date Taking? Authorizing Provider  azelastine (ASTELIN) 0.1 % nasal spray Place 1 spray into both nostrils 2 (two) times daily. Use in each nostril as directed    [provider]  busPIRone (BUSPAR) 15 MG tablet Take 15-30 mg by mouth See admin instructions. Take 15 mg by mouth in the morning and 22.5-30 mg after supper    [provider]  clonazePAM (KLONOPIN) 0.5 MG tablet Take 0.5 mg by mouth See admin instructions. Take 0.5 mg by mouth at bedtime and an additional 0.5 mg once a day as needed for palpitations    [provider]  colesevelam (WELCHOL) 625 MG tablet Take 625 mg by mouth daily with breakfast.    [provider]  escitalopram (LEXAPRO) 10 MG tablet Take 0.5 tablets (5 mg total) by mouth daily. 12/10/21   Melvenia Beam, MD  eszopiclone (LUNESTA) 2 MG TABS tablet Take 2 mg by mouth at bedtime. Take immediately before bedtime    [provider]  ezetimibe (ZETIA) 10 MG tablet Take 10 mg by  mouth daily.    [provider]  fluticasone (FLONASE) 50 MCG/ACT nasal spray Place 1 spray into both nostrils at bedtime. 01/05/13   [provider]  furosemide (LASIX) 80 MG tablet Take 1 tablet (80 mg total) by mouth daily. 12/14/21   Cirigliano, Vito V, DO  ipratropium (ATROVENT) 0.06 % nasal spray Place 1 spray into the nose daily. 02/17/19   [provider]  lactulose (CHRONULAC) 10 GM/15ML solution Take 15 mLs (10 g total) by mouth 2 (two) times daily as needed for mild constipation (for bowel movments 2-3 per day). 07/06/21   Pokhrel, Corrie Mckusick, MD  levETIRAcetam (KEPPRA XR) 500 MG 24 hr tablet Take 2 tablets (1,000 mg total) by mouth daily. 12/10/21   Melvenia Beam, MD  levocetirizine  (XYZAL) 5 MG tablet Take 5 mg by mouth every evening.    [provider]  Levothyroxine Sodium 88 MCG CAPS Take 88 mcg by mouth daily before breakfast.    [provider]  ondansetron (ZOFRAN ODT) 4 MG disintegrating tablet Take 1 tablet (4 mg total) by mouth every 8 (eight) hours as needed for nausea or vomiting. Patient taking differently: Take 4 mg by mouth every 8 (eight) hours as needed for nausea or vomiting (dissolve orally). 02/08/20   Volanda Napoleon, PA-C  pantoprazole (PROTONIX) 20 MG tablet Take 1 tablet (20 mg total) by mouth 2 (two) times daily. Then take 1 tablet by mouth daily 12/08/21   Cirigliano, Vito V, DO  rifaximin (XIFAXAN) 550 MG TABS tablet Take 1 tablet (550 mg total) by mouth 2 (two) times daily. 12/15/21   Cirigliano, Vito V, DO  sildenafil (REVATIO) 20 MG tablet Take 1 tablet by mouth as needed. 02/26/19   [provider]  spironolactone (ALDACTONE) 100 MG tablet Take 1 tablet (100 mg total) by mouth daily. Take 1 tablet with 50 mg tablet (150 mg total) by mouth daily. 12/08/21   Cirigliano, Vito V, DO  spironolactone (ALDACTONE) 50 MG tablet Take 1 tablet (50 mg total) by mouth daily. Take 1 tablet with 100 mg tablet (150 mg total) by mouth daily. 12/08/21   Cirigliano, Vito V, DO  thiamine 100 MG tablet Take 1 tablet (100 mg total) by mouth daily. 07/06/21   Pokhrel, Corrie Mckusick, MD  traMADol (ULTRAM) 50 MG tablet Take 25 mg by mouth 3 (three) times daily as needed. 07/30/21   [provider]     Family History  Problem Relation Age of Onset   Breast cancer Mother    Hyperlipidemia Mother    Other Mother        ? form of parkinson's    Parkinsonism Mother    Bladder Cancer Father    Parkinson's disease Father    Memory loss Maternal Grandmother        in her 27s   Birth defects Maternal Grandfather    Birth defects Paternal Grandmother    Colon cancer Neg Hx    Esophageal cancer Neg Hx    Rectal cancer Neg Hx    Stomach cancer Neg Hx     Colon polyps Neg Hx    Dementia Neg Hx    Alzheimer's disease Neg Hx     Social History   Socioeconomic History   Marital status: Married    Spouse name: Not on file   Number of children: 2   Years of education: Not on file   Highest education level: Associate degree: academic program  Occupational History   Occupation:  sales  Tobacco Use   Smoking status: Former    Types: Cigarettes    Quit date: 03/27/1994    Years since quitting: 27.7   Smokeless tobacco: Former    Types: Chew   Tobacco comments:    did chew as a teenager  Vaping Use   Vaping Use: Never used  Substance and Sexual Activity   Alcohol use: Not Currently    Alcohol/week: 2.0 standard drinks    Types: 2 Glasses of wine per week   Drug use: No   Sexual activity: Not on file  Other Topics Concern   Not on file  Social History Narrative   Lives at home with wife    Right handed   Caffeine: maybe 1-3 cups/week   Social Determinants of Health   Financial Resource Strain: Not on file  Food Insecurity: Not on file  Transportation Needs: Not on file  Physical Activity: Not on file  Stress: Not on file  Social Connections: Not on file    Review of Systems: A 12 point ROS discussed and pertinent positives are indicated in the HPI above.  All other systems are negative.  Vital Signs: There were no vitals taken for this visit.  No physical examination was performed in lieu of virtual telephone clinic visit.  Imaging: 07/02/21  Patent portal system, no varices, anatomy amenable to TIPS creation  Labs: 11/30/21 --> 12/09/21 Creatinine: 0.76 --> 0.90 Total Bilirubin: 5.4  INR: 1.6  Sodium: 130 --> 130 Albumin: 3.1   Child-Pugh = 10 points, class C MELD = 18 (6% estimated 3 month mortality) [place] Index of Post-TIPS Survival (FIPS) = 0.75 (Overall survival estimated at 1 month 90.8%, 3 months 73.8%, and 6 months 64.2%)    Assessment and Plan: 61 year old male with history of NASH/EtOH  cirrhosis with recurrent ascites.  He has been working to maximize diuretics with Dr. Bryan Lemma, which were only started 2 days ago.  At this time, I recommend continued surveillance of diuretic response.  If he fails to tolerate or does not respond, we can then consider TIPS.  Given that ascites onset is relatively new, we discussed obtaining a CT of the abdomen and pelvis to ensure there is no portal vein thrombus or other obvious etiology contributing to recent worsening of portal hypertension.    -obtain BRTO protocol CTA abdomen/pelvis (ideally within the next 2 weeks) -encouraged paracentesis as needed in addition to within the few days prior to departing for [place] in hopes to avoid having to find a hospital when abroad -follow up in 2 months/late July after returning from [place] vacation    I spent a total of  40 Minutes  in virtual telephone clinical consultation, greater than 50% of which was counseling/coordinating care for portal hypertension.     Electronically Signed: Suzette Battiest, MD 12/17/2021, 2:48 PM

## 2021-12-18 ENCOUNTER — Other Ambulatory Visit: Payer: Self-pay | Admitting: Interventional Radiology

## 2021-12-18 ENCOUNTER — Other Ambulatory Visit (INDEPENDENT_AMBULATORY_CARE_PROVIDER_SITE_OTHER): Payer: BC Managed Care – PPO

## 2021-12-18 DIAGNOSIS — K7031 Alcoholic cirrhosis of liver with ascites: Secondary | ICD-10-CM

## 2021-12-18 DIAGNOSIS — R188 Other ascites: Secondary | ICD-10-CM | POA: Diagnosis not present

## 2021-12-18 DIAGNOSIS — K7460 Unspecified cirrhosis of liver: Secondary | ICD-10-CM

## 2021-12-18 LAB — BASIC METABOLIC PANEL
BUN: 10 mg/dL (ref 6–23)
CO2: 27 mEq/L (ref 19–32)
Calcium: 8.6 mg/dL (ref 8.4–10.5)
Chloride: 99 mEq/L (ref 96–112)
Creatinine, Ser: 0.95 mg/dL (ref 0.40–1.50)
GFR: 87.02 mL/min (ref >60.00)
Glucose, Bld: 129 mg/dL — ABNORMAL HIGH (ref 70–99)
Potassium: 3.5 mEq/L (ref 3.5–5.1)
Sodium: 133 mEq/L — ABNORMAL LOW (ref 135–145)

## 2021-12-18 LAB — PATHOLOGIST SMEAR REVIEW: Path Review: NEGATIVE

## 2021-12-22 LAB — CULTURE, BODY FLUID W GRAM STAIN -BOTTLE: Culture: NO GROWTH

## 2021-12-22 NOTE — Progress Notes (Signed)
12/03/2021 3 PM-4 PM:  Today's visit was an in person visit that was conducted in my outpatient clinic office.  Today I provided feedback regarding the results of the recent neuropsychological evaluation and went over more in-depth issues related to treatment recommendations and strategies going forward with residual effects of his postconcussion syndrome including short-term memory loss and other cognitive complaints.  I have included a copy of the summary and impressions of the formal neuropsychological evaluation below for convenience.  It can be found in its entirety in the patient's EMR dated 09/30/2021.  Summary of Results:                        Overall, the results of the current neuropsychological evaluation are quite striking and the level of deficits noted.  However, it does appear that significant impairments for both auditory and visual encoding are playing a oversize role in many other areas assessed.  The patient had to have instructions repeatedly given to him and he had to be redirected and reminded of the task at hand on multiple occasions during the assessment.  Objective assessment of visual and auditory information were both indicative of significant deficits.  The patient also showed severe deficits with regard to focus execute abilities and information processing speed which is another component of attention and concentration.  I do think that this is the primary area of deficit and is having a significant impact on other areas of cognitive functioning.  The patient is showing profound deficits with regard to attentional variables including encoding and focus execute abilities as well as significant and profound deficits for auditory and visual memory with visual memory functions being even more significantly impaired than auditory memory.  However, the patient's day-to-day functioning would not suggest actual memory deficits to this degree and I do think that his encoding deficits and focus  execute abilities played a major role in the level of memory impairments noted.  The patient was also showing significant deficits with regard to visual spatial, visual reasoning and visual processing functions as well and difficulty effectively recalling and retrieving even well learned information.   Impression/Diagnosis:                     The results of the current neuropsychological evaluation are consistent between the patient's reports and his wife reports about significant cognitive difficulties.  However, the obtained scores were significantly below and worse than the descriptions provided by the patient and his wife.  The patient was so severely impaired during the objective assessment that it would have been very difficult for him to manage even basic ADL type functions around his house.  I do think that it is difficult to assess effectively areas outside of those primarily related to attentional deficits particularly for auditory and visual encoding and focus execute capacity.  These are so impaired that they had a significant impact on all other cognitive areas assessed.  The patient does clearly have significant cognitive deficits with significant memory and attentional deficits present.  This pattern would be consistent with postconcussion syndrome types of deficits but they are will be on what would be expected typically from a concussive event and isolation.  As far as diagnostic considerations, the patient presents with a very complicated medical and neuropsychological picture.  The patient has had multiple concussive events going back over 3 decades.  However, the most changes were noted after a significant fall in September 2021.  The  patient began having seizure-like events that were consistent with complex partial type seizures.  He has responded very well to Keppra as far as reducing these events.  He would have acute confusion, disorientation and balance issues during these times.  While  neuro imaging including MRIs did not show any observable abnormalities I suspect that the patient has had some degree of diffuse axonal injury as his cognitive deficits go well beyond those typically seen with lobar white matter injury slowly.  I do, however, think that there are other issues going on that are contributing and worsening his symptoms although some of which we are not going to be able to avoid.  The patient has responded very well to Keppra and this is decreased his acute seizure/confusional events.  However, due to his anxiety and depressive symptomatology and sleep difficulties he has continued to take benzo diazepam such as Ambien, clonazepam as well as Keppra for his seizures.  The patient has longstanding sleep disturbance but was not found to have sleep apnea.  [place] of his [place] win[place] he had an elevated blood glucose level of 242 which is significant although I suspect that his baseline is relatively high to begin with.  Thyroid issues, chronic insomnia, treatment of his anxiety with benzo diazepam, seizures and the focal injury or location contributing to the seizure events are all likely producing the primary complex of symptoms resulting in his subjective experiences.  The primary area of cognitive deficits around attention and focus execute abilities would suggest primary frontal lobe involvement and his deficits in the degree of other areas including temporal lobe functioning are very difficult to ascertain as his attentional deficits are so significant profound that almost all other neuropsychological measures are going to be disproportionately and significantly impacted due to his difficulty keeping instructions and directions straight during various tasks.  As far as treatment recommendations it will be important for the patient to continue to work on his depression and anxiety.  He should also continue close monitoring on metabolic issues including his diabetes  and thyroid issues and overall liver function etc.  Whenever possible I would suggest he limit his benzo diazepam use if possible as they may exacerbate some of his memory and attentional issues.  While clearly his Keppra use is not the cause of his overall cognitive dysfunction and is essential for managing his seizure activity it may also be playing some role in his cognitive issues but his attentional and memory deficits are so significant that they could not be explained by typical side effects from Keppra.   I will sit down with the patient and go over all the results of the current neuropsychological evaluation.  We will address issues related to how to better compensate for his significant attentional issues and encoding deficits and explain how these areas of cognitive functioning are good impact other life details.  Good dietary habits, working on improving his sleep anyway we can and maintaining sustained physical activity will be important overall.   Diagnosis:                                Postconcussion syndrome   Short-term memory loss   History of multiple concussions   History of seizure   Cognitive complaints   Memory loss     _____________________ Arley Phenix, Psy.D. Clinical Neuropsychologist

## 2021-12-23 DIAGNOSIS — M25512 Pain in left shoulder: Secondary | ICD-10-CM | POA: Diagnosis not present

## 2021-12-24 ENCOUNTER — Other Ambulatory Visit: Payer: Self-pay

## 2021-12-24 DIAGNOSIS — K7031 Alcoholic cirrhosis of liver with ascites: Secondary | ICD-10-CM

## 2021-12-24 NOTE — Telephone Encounter (Signed)
Ok to schedule paracentesis in early June, with 6L removed and fluid sent for cell count and Gram stain/culture.   Yes, since they still intend to travel (despite my strong medical advice against this) I definitely think it is a good idea to take a copy of his medical records with him. They should have access to everything via MyChart, but also printing the last office note would be a very clear outline for at least his GI/Liver care should he require emergent medical attention while abroad. As stated before, it is certainly against my best advice to travel given his current medical condition, but do appreciate their diligence in researching hospitals that have advanced GI and Hepatology expertise while traveling should he run into issues. I will not be able to titrate medications or facilitate care while he is abroad.

## 2021-12-24 NOTE — Telephone Encounter (Signed)
Office note mailed to pt's home address.

## 2021-12-29 ENCOUNTER — Ambulatory Visit (HOSPITAL_COMMUNITY)
Admission: RE | Admit: 2021-12-29 | Discharge: 2021-12-29 | Disposition: A | Discharge status: Home / Self Care | Payer: BC Managed Care – PPO | Source: Ambulatory Visit | Attending: Interventional Radiology | Admitting: Interventional Radiology

## 2021-12-29 DIAGNOSIS — I700 Atherosclerosis of aorta: Secondary | ICD-10-CM | POA: Diagnosis not present

## 2021-12-29 DIAGNOSIS — K7460 Unspecified cirrhosis of liver: Secondary | ICD-10-CM | POA: Diagnosis not present

## 2021-12-29 DIAGNOSIS — K7031 Alcoholic cirrhosis of liver with ascites: Secondary | ICD-10-CM | POA: Insufficient documentation

## 2021-12-29 DIAGNOSIS — K5730 Diverticulosis of large intestine without perforation or abscess without bleeding: Secondary | ICD-10-CM | POA: Diagnosis not present

## 2021-12-29 MED ORDER — IOHEXOL 350 MG/ML SOLN
100.0000 mL | Freq: Once | INTRAVENOUS | Status: AC | PRN
  Administered 2021-12-29: 100 mL via INTRAVENOUS

## 2021-12-30 DIAGNOSIS — K7031 Alcoholic cirrhosis of liver with ascites: Secondary | ICD-10-CM | POA: Diagnosis not present

## 2021-12-30 DIAGNOSIS — J309 Allergic rhinitis, unspecified: Secondary | ICD-10-CM | POA: Diagnosis not present

## 2021-12-30 DIAGNOSIS — E118 Type 2 diabetes mellitus with unspecified complications: Secondary | ICD-10-CM | POA: Diagnosis not present

## 2021-12-31 ENCOUNTER — Ambulatory Visit
Admission: RE | Admit: 2021-12-31 | Discharge: 2021-12-31 | Disposition: A | Discharge status: Home / Self Care | Payer: BC Managed Care – PPO | Source: Ambulatory Visit | Attending: Interventional Radiology | Admitting: Interventional Radiology

## 2021-12-31 DIAGNOSIS — R188 Other ascites: Secondary | ICD-10-CM | POA: Diagnosis not present

## 2021-12-31 DIAGNOSIS — K7031 Alcoholic cirrhosis of liver with ascites: Secondary | ICD-10-CM

## 2021-12-31 HISTORY — PX: IR RADIOLOGIST EVAL & MGMT: IMG5224

## 2021-12-31 NOTE — Progress Notes (Addendum)
Reason for follow up: Telephone virtual visit for review of recent imaging after initial consult   Referring Physician(s): Cirigliano,Vito V     History of Present Illness: Initial consult "Andrew Patton is a 61 y.o. male with history of EtOH/NASH cirrhosis initially diagnosed in 2020.  He has had 3 paracenteses in the past month: 4/26 - 5 L 5/3 - 5 L 5/18 - 6 L    Dr. Barron Alvine increased furosemide from 40 to 80 mg QD and spironolactone from 100 to 150 mg QD on 12/08/21, which was just started 2 days ago.  He states his urine output has increased and become more clear.  No history of hepatic hydrothorax or encephalopathy.  He is currently on lactulose and rifaximin.  He has been referred to the [facility] which is set for late July.  He underwent EGD on 08/11/21 which demonstrated moderate portal gastropathy.  No prior endoscopic intervention, history of gastroesophageal varices, hematemesis, or blood in stool.  He has normal renal and cardiac function.  Other pertinent past medical history includes psoriasis, anxiety, diabetes, hypothyroidism, hyperlipidemia, hypertension.   On June 8 he is leaving to travel [place] for a month with his family, a trip he has had planned for two years.  He is a Copy. "  He underwent CT abdomen/pelvis on 12/29/21.  Since our last visit he has not required additional paracentesis.  He is overall feeling similar, with most complaints concerning his umbilical hernia which is painful.       Past Medical History:  Diagnosis Date   Allergy     Anxiety     Arthritis     Ascites     Diabetes (HCC)     Elevated transaminase level     Fatigue     Fatty liver     Hyperlipidemia     Hypertension      "under control", not on any blood pressure medication, has lost 60 lbs   Hypothyroidism      on synthroid   Inguinal muscle strain     Insomnia     Left knee pain      "long time ago"   Multinodular goiter     Obesity      Pneumonia     Psoriasis     Tinnitus             Past Surgical History:  Procedure Laterality Date   APPENDECTOMY   1976   FOOT SURGERY Bilateral 1975   IR PARACENTESIS   11/25/2021   IR PARACENTESIS   12/02/2021   NOSE SURGERY       ROTATOR CUFF REPAIR Left      left   SHOULDER SURGERY Right 12/20/2019    [facility]       Allergies: Crestor [rosuvastatin calcium], Meloxicam, Rosuvastatin, and Penicillins   Medications:        Prior to Admission medications   Medication Sig Start Date End Date Taking? Authorizing Provider  azelastine (ASTELIN) 0.1 % nasal spray Place 1 spray into both nostrils 2 (two) times daily. Use in each nostril as directed       [provider]  busPIRone (BUSPAR) 15 MG tablet Take 15-30 mg by mouth See admin instructions. Take 15 mg by mouth in the morning and 22.5-30 mg after supper       [provider]  clonazePAM (KLONOPIN) 0.5 MG tablet Take 0.5 mg by mouth See admin instructions. Take 0.5  mg by mouth at bedtime and an additional 0.5 mg once a day as needed for palpitations       [provider]  colesevelam (WELCHOL) 625 MG tablet Take 625 mg by mouth daily with breakfast.       [provider]  escitalopram (LEXAPRO) 10 MG tablet Take 0.5 tablets (5 mg total) by mouth daily. 12/10/21     Anson FretAhern, Antonia B, MD  eszopiclone (LUNESTA) 2 MG TABS tablet Take 2 mg by mouth at bedtime. Take immediately before bedtime       [provider]  ezetimibe (ZETIA) 10 MG tablet Take 10 mg by mouth daily.       [provider]  fluticasone (FLONASE) 50 MCG/ACT nasal spray Place 1 spray into both nostrils at bedtime. 01/05/13     [provider]  furosemide (LASIX) 80 MG tablet Take 1 tablet (80 mg total) by mouth daily. 12/14/21     Cirigliano, Vito V, DO  ipratropium (ATROVENT) 0.06 % nasal spray Place 1 spray into the nose daily. 02/17/19     [provider]  lactulose (CHRONULAC) 10  GM/15ML solution Take 15 mLs (10 g total) by mouth 2 (two) times daily as needed for mild constipation (for bowel movments 2-3 per day). 07/06/21     Pokhrel, Rebekah ChesterfieldLaxman, MD  levETIRAcetam (KEPPRA XR) 500 MG 24 hr tablet Take 2 tablets (1,000 mg total) by mouth daily. 12/10/21     Anson FretAhern, Antonia B, MD  levocetirizine (XYZAL) 5 MG tablet Take 5 mg by mouth every evening.       [provider]  Levothyroxine Sodium 88 MCG CAPS Take 88 mcg by mouth daily before breakfast.       [provider]  ondansetron (ZOFRAN ODT) 4 MG disintegrating tablet Take 1 tablet (4 mg total) by mouth every 8 (eight) hours as needed for nausea or vomiting. Patient taking differently: Take 4 mg by mouth every 8 (eight) hours as needed for nausea or vomiting (dissolve orally). 02/08/20     Maxwell CaulLayden, Lindsey A, PA-C  pantoprazole (PROTONIX) 20 MG tablet Take 1 tablet (20 mg total) by mouth 2 (two) times daily. Then take 1 tablet by mouth daily 12/08/21     Cirigliano, Vito V, DO  rifaximin (XIFAXAN) 550 MG TABS tablet Take 1 tablet (550 mg total) by mouth 2 (two) times daily. 12/15/21     Cirigliano, Vito V, DO  sildenafil (REVATIO) 20 MG tablet Take 1 tablet by mouth as needed. 02/26/19     [provider]  spironolactone (ALDACTONE) 100 MG tablet Take 1 tablet (100 mg total) by mouth daily. Take 1 tablet with 50 mg tablet (150 mg total) by mouth daily. 12/08/21     Cirigliano, Vito V, DO  spironolactone (ALDACTONE) 50 MG tablet Take 1 tablet (50 mg total) by mouth daily. Take 1 tablet with 100 mg tablet (150 mg total) by mouth daily. 12/08/21     Cirigliano, Vito V, DO  thiamine 100 MG tablet Take 1 tablet (100 mg total) by mouth daily. 07/06/21     Pokhrel, Rebekah ChesterfieldLaxman, MD  traMADol (ULTRAM) 50 MG tablet Take 25 mg by mouth 3 (three) times daily as needed. 07/30/21     [provider]           Family History  Problem Relation Age of Onset   Breast cancer Mother     Hyperlipidemia Mother     Other Mother           ?  form of parkinson's    Parkinsonism Mother     Bladder Cancer Father     Parkinson's disease Father     Memory loss Maternal Grandmother          in her 36s   Birth defects Maternal Grandfather     Birth defects Paternal Grandmother     Colon cancer Neg Hx     Esophageal cancer Neg Hx     Rectal cancer Neg Hx     Stomach cancer Neg Hx     Colon polyps Neg Hx     Dementia Neg Hx     Alzheimer's disease Neg Hx        Social History         Socioeconomic History   Marital status: Married      Spouse name: Not on file   Number of children: 2   Years of education: Not on file   Highest education level: Associate degree: academic program  Occupational History   Occupation: Airline pilot  Tobacco Use   Smoking status: Former      Types: Cigarettes      Quit date: 03/27/1994      Years since quitting: 27.7   Smokeless tobacco: Former      Types: Chew   Tobacco comments:      did chew as a teenager  Vaping Use   Vaping Use: Never used  Substance and Sexual Activity   Alcohol use: Not Currently      Alcohol/week: 2.0 standard drinks      Types: 2 Glasses of wine per week   Drug use: No   Sexual activity: Not on file  Other Topics Concern   Not on file  Social History Narrative    Lives at home with wife     Right handed    Caffeine: maybe 1-3 cups/week    Social Determinants of Health    Financial Resource Strain: Not on file  Food Insecurity: Not on file  Transportation Needs: Not on file  Physical Activity: Not on file  Stress: Not on file  Social Connections: Not on file      Review of Systems: A 12 point ROS discussed and pertinent positives are indicated in the HPI above.  All other systems are negative.   Vital Signs: There were no vitals taken for this visit.   No physical examination was performed in lieu of virtual telephone clinic visit.   Imaging: 07/02/21 Patent portal system, no varices, anatomy amenable to TIPS creation  12/29/21  Similar  appearing patent portal system, no varices, anatomy amenable to TIPS creation   Labs: 11/30/21 --> 12/09/21 Creatinine: 0.76 --> 0.90 Total Bilirubin: 5.4  INR: 1.6  Sodium: 130 --> 130 Albumin: 3.1   Child-Pugh = 10 points, class C MELD = 18 (6% estimated 3 month mortality) [place] Index of Post-TIPS Survival (FIPS) = 0.75 (Overall survival estimated at 1 month 90.8%, 3 months 73.8%, and 6 months 64.2%)       Assessment and Plan: 61 year old male with history of NASH/EtOH cirrhosis with recurrent ascites.  His recent CT shows no evidence of portal thrombus.  He has been working to maximize diuretics with Dr. Barron Alvine.  He has noticed some improvement, requiring less frequent paracenteses and his weight has been lower than in recent history.  I recommend continued surveillance of diuretic response.  If he fails to tolerate or does not respond, we can then consider TIPS.   We also discussed  his hernia which is filled with ascites, and how resolution of ascites would be the only way this would resolve/give him a chance at surgical repair.  -encouraged paracentesis as needed in addition to within the few days prior to departing for [place] in hopes to avoid having to find a hospital when abroad (scheduled for 01/05/22) -follow up in early August after returning from [place] vacation and visiting with [facility]      I spent a total of  25 Minutes  in virtual telephone clinical consultation, greater than 50% of which was counseling/coordinating care for portal hypertension.       Marliss Coots, MD Pager: [phone] Clinic: [phone]

## 2022-01-05 ENCOUNTER — Ambulatory Visit (HOSPITAL_COMMUNITY)
Admission: RE | Admit: 2022-01-05 | Discharge: 2022-01-05 | Disposition: A | Discharge status: Home / Self Care | Payer: BC Managed Care – PPO | Source: Ambulatory Visit | Attending: Gastroenterology | Admitting: Gastroenterology

## 2022-01-05 DIAGNOSIS — K7031 Alcoholic cirrhosis of liver with ascites: Secondary | ICD-10-CM | POA: Diagnosis not present

## 2022-01-05 DIAGNOSIS — R188 Other ascites: Secondary | ICD-10-CM | POA: Diagnosis not present

## 2022-01-05 HISTORY — PX: IR PARACENTESIS: IMG2679

## 2022-01-05 LAB — BODY FLUID CELL COUNT WITH DIFFERENTIAL
Eos, Fluid: 0 %
Lymphs, Fluid: 29 %
Monocyte-Macrophage-Serous Fluid: 69 % (ref 50–90)
Neutrophil Count, Fluid: 2 % (ref 0–25)
Total Nucleated Cell Count, Fluid: 22 cu mm (ref 0–1000)

## 2022-01-05 LAB — GRAM STAIN

## 2022-01-05 MED ORDER — ALBUMIN HUMAN 25 % IV SOLN
50.0000 g | Freq: Once | INTRAVENOUS | Status: AC
  Administered 2022-01-05: 50 g via INTRAVENOUS
  Filled 2022-01-05: qty 200

## 2022-01-05 MED ORDER — LIDOCAINE HCL (PF) 1 % IJ SOLN
INTRAMUSCULAR | Status: DC | PRN
  Administered 2022-01-05: 10 mL

## 2022-01-05 MED ORDER — LIDOCAINE HCL 1 % IJ SOLN
INTRAMUSCULAR | Status: AC
  Filled 2022-01-05: qty 20

## 2022-01-05 MED ORDER — ALBUMIN HUMAN 25 % IV SOLN
INTRAVENOUS | Status: AC
  Filled 2022-01-05: qty 200

## 2022-01-05 NOTE — Procedures (Signed)
PROCEDURE SUMMARY:  Successful ultrasound guided paracentesis from the right lower quadrant.  Yielded 5.0L of clear ascitic fluid.  No immediate complications.  The patient tolerated the procedure well.   Specimen was sent for labs.  EBL < 35mL  The patient has previously been formally evaluated by the [facility] and is being actively followed for potential future intervention.   Marliss Coots, MD Pager: [phone]

## 2022-01-06 LAB — PATHOLOGIST SMEAR REVIEW

## 2022-01-10 LAB — CULTURE, BODY FLUID W GRAM STAIN -BOTTLE: Culture: NO GROWTH

## 2022-02-01 ENCOUNTER — Encounter: Payer: Self-pay | Admitting: Gastroenterology

## 2022-02-01 ENCOUNTER — Telehealth: Payer: Self-pay | Admitting: Gastroenterology

## 2022-02-01 NOTE — Telephone Encounter (Signed)
Inbound call from patients wife stating that patient and herself would be returning from [place] on Wednesday July 5 at 6:30 pm. Wife stated that patient is in need of a paracentesis and is requesting a call back to discuss scheduling. Asking to be called back sooner than later due to being on a 6 hour difference time schedule. Please advise.

## 2022-02-01 NOTE — Telephone Encounter (Signed)
Dr. Barron Alvine pt with a history of cirrhosis and ascites receiving therapeutic paracenteses. Spoke with pt's wife and wife stated that while they have been on vacation in [place] for the past month pt has had to get a paracentesis almost every week. Doctor in [place] adjusted Spironolactone and now pt is currently taking 200 mg instead of 150 mg. Pt also taking lasix 80 mg. Pt's wife wanted to know if pt could be scheduled on July 6th because pt is needing a paracentesis again. Pt's last paracentesis was last Thursday. Dr. Leonides Schanz as DOD please advise.

## 2022-02-03 ENCOUNTER — Other Ambulatory Visit: Payer: Self-pay

## 2022-02-03 DIAGNOSIS — K7031 Alcoholic cirrhosis of liver with ascites: Secondary | ICD-10-CM

## 2022-02-03 NOTE — Telephone Encounter (Signed)
See 7/3 mychart message.

## 2022-02-03 NOTE — Telephone Encounter (Signed)
6 L max.  Please send fluid for cell count, Gram stain, culture.  Given adjustment in diuretics while abroad, check BMP.  Please schedule follow-up appointment in GI clinic.  Please also have patient and wife bring paperwork from [place] for review.

## 2022-02-04 ENCOUNTER — Ambulatory Visit (HOSPITAL_COMMUNITY)
Admission: RE | Admit: 2022-02-04 | Discharge: 2022-02-04 | Disposition: A | Discharge status: Home / Self Care | Payer: BC Managed Care – PPO | Source: Ambulatory Visit | Attending: Gastroenterology | Admitting: Gastroenterology

## 2022-02-04 DIAGNOSIS — K7031 Alcoholic cirrhosis of liver with ascites: Secondary | ICD-10-CM | POA: Diagnosis not present

## 2022-02-04 DIAGNOSIS — R188 Other ascites: Secondary | ICD-10-CM | POA: Diagnosis not present

## 2022-02-04 HISTORY — PX: IR PARACENTESIS: IMG2679

## 2022-02-04 LAB — GRAM STAIN: Gram Stain: NONE SEEN

## 2022-02-04 LAB — BODY FLUID CELL COUNT WITH DIFFERENTIAL
Eos, Fluid: 0 %
Lymphs, Fluid: 49 %
Monocyte-Macrophage-Serous Fluid: 47 % — ABNORMAL LOW (ref 50–90)
Neutrophil Count, Fluid: 4 % (ref 0–25)
Total Nucleated Cell Count, Fluid: 101 cu mm (ref 0–1000)

## 2022-02-04 MED ORDER — ALBUMIN HUMAN 25 % IV SOLN
50.0000 g | Freq: Once | INTRAVENOUS | Status: AC
  Administered 2022-02-04: 50 g via INTRAVENOUS
  Filled 2022-02-04: qty 200

## 2022-02-04 MED ORDER — LIDOCAINE HCL 1 % IJ SOLN
INTRAMUSCULAR | Status: AC
  Filled 2022-02-04: qty 20

## 2022-02-04 MED ORDER — LIDOCAINE HCL (PF) 1 % IJ SOLN
INTRAMUSCULAR | Status: DC | PRN
  Administered 2022-02-04: 10 mL

## 2022-02-04 MED ORDER — ALBUMIN HUMAN 25 % IV SOLN
INTRAVENOUS | Status: AC
  Filled 2022-02-04: qty 200

## 2022-02-05 ENCOUNTER — Other Ambulatory Visit (INDEPENDENT_AMBULATORY_CARE_PROVIDER_SITE_OTHER): Payer: BC Managed Care – PPO

## 2022-02-05 ENCOUNTER — Telehealth (HOSPITAL_COMMUNITY): Payer: Self-pay | Admitting: Student

## 2022-02-05 DIAGNOSIS — K7031 Alcoholic cirrhosis of liver with ascites: Secondary | ICD-10-CM | POA: Diagnosis not present

## 2022-02-05 LAB — BASIC METABOLIC PANEL
BUN: 15 mg/dL (ref 6–23)
CO2: 24 mEq/L (ref 19–32)
Calcium: 8.6 mg/dL (ref 8.4–10.5)
Chloride: 95 mEq/L — ABNORMAL LOW (ref 96–112)
Creatinine, Ser: 0.91 mg/dL (ref 0.40–1.50)
GFR: 91.54 mL/min (ref >60.00)
Glucose, Bld: 188 mg/dL — ABNORMAL HIGH (ref 70–99)
Potassium: 3.9 mEq/L (ref 3.5–5.1)
Sodium: 127 mEq/L — ABNORMAL LOW (ref 135–145)

## 2022-02-05 NOTE — Telephone Encounter (Signed)
Patient currently being followed by the [facility] Portal [facility]pertension [facility] for possible future TIPS. Patient seen yesterday in IR for paracentesis. Per Dr. Jerrye Noble last note 12/31/21, patient has a follow up appointment with the [facility]plant team (unsure of date) but no follow up is currently scheduled with our clinic. I called the patient and left him a voice mail letting him know our team wanted to check in with him and schedule a follow up appointment with Dr. Elby Showers if needed.    Alwyn Ren, [place] [phone] 02/05/2022, 11:15 AM .

## 2022-02-06 LAB — PATHOLOGIST SMEAR REVIEW

## 2022-02-08 ENCOUNTER — Telehealth (HOSPITAL_COMMUNITY): Payer: Self-pay | Admitting: Student

## 2022-02-08 NOTE — Telephone Encounter (Signed)
Patient has a meeting scheduled with [facility] 02/25/22 to discuss candidacy for liver transplant. Patient was encouraged to discuss TIPS procedure with [facility] and to keep [name] posted about their future treatment plans. Patient has been evaluated by our team for TIPS procedure and is considered a potential candidate.   Andrew Patton, [place] [phone] 02/08/2022, 3:51 PM

## 2022-02-09 LAB — CULTURE, BODY FLUID W GRAM STAIN -BOTTLE: Culture: NO GROWTH

## 2022-02-10 ENCOUNTER — Other Ambulatory Visit: Payer: Self-pay

## 2022-02-10 DIAGNOSIS — K7031 Alcoholic cirrhosis of liver with ascites: Secondary | ICD-10-CM

## 2022-02-10 NOTE — Telephone Encounter (Signed)
Ok to schedule paracentesis this week.  Remove no more than 8 L.  Give albumin 25% 6 g for every liter removed.  Send fluid for cell count, Gram stain, culture.  Looks like he has an appointment with [facility] on 02/25/2022.  Will be interested to discuss the plan at his follow-up appointment with me 1 week after that.  He should also discuss with [facility] the role/utility of TIPS for improved ascites management.

## 2022-02-10 NOTE — Telephone Encounter (Signed)
Spoke with pt's wife and pt is scheduled for f/u appt on 03/05/22 at 2:20 pm. Also let pt's wife know that I would send her message about paracentesis this week to the doctor. Pt's wife verbalized understanding.

## 2022-02-15 ENCOUNTER — Ambulatory Visit (HOSPITAL_COMMUNITY)
Admission: RE | Admit: 2022-02-15 | Discharge: 2022-02-15 | Disposition: A | Discharge status: Home / Self Care | Payer: BC Managed Care – PPO | Source: Ambulatory Visit | Attending: Gastroenterology | Admitting: Gastroenterology

## 2022-02-15 DIAGNOSIS — K7031 Alcoholic cirrhosis of liver with ascites: Secondary | ICD-10-CM

## 2022-02-15 HISTORY — PX: IR PARACENTESIS: IMG2679

## 2022-02-15 LAB — GRAM STAIN

## 2022-02-15 LAB — BODY FLUID CELL COUNT WITH DIFFERENTIAL
Eos, Fluid: 0 %
Lymphs, Fluid: 77 %
Monocyte-Macrophage-Serous Fluid: 20 % — ABNORMAL LOW (ref 50–90)
Neutrophil Count, Fluid: 3 % (ref 0–25)
Total Nucleated Cell Count, Fluid: 96 cu mm (ref 0–1000)

## 2022-02-15 MED ORDER — LIDOCAINE HCL 1 % IJ SOLN
INTRAMUSCULAR | Status: AC
  Filled 2022-02-15: qty 20

## 2022-02-15 MED ORDER — LIDOCAINE HCL (PF) 1 % IJ SOLN
INTRAMUSCULAR | Status: DC | PRN
  Administered 2022-02-15: 10 mL

## 2022-02-15 MED ORDER — ALBUMIN HUMAN 25 % IV SOLN
50.0000 g | Freq: Once | INTRAVENOUS | Status: AC
  Administered 2022-02-15: 50 g via INTRAVENOUS
  Filled 2022-02-15: qty 200

## 2022-02-15 MED ORDER — ALBUMIN HUMAN 25 % IV SOLN
INTRAVENOUS | Status: AC
  Filled 2022-02-15: qty 200

## 2022-02-15 NOTE — Progress Notes (Signed)
PROCEDURE SUMMARY:  Successful US guided paracentesis from LLQ.  Yielded 8.0L of straw colored fluid.  No immediate complications.  Pt tolerated well.   Specimen not sent for labs.  EBL < 57mL  Erleen Egner PA-C 02/15/2022 3:09 PM

## 2022-02-16 ENCOUNTER — Other Ambulatory Visit: Payer: Self-pay

## 2022-02-16 DIAGNOSIS — K7031 Alcoholic cirrhosis of liver with ascites: Secondary | ICD-10-CM

## 2022-02-16 NOTE — Telephone Encounter (Signed)
Ok to schedule paracentesis next week.  Remove no more than 8 L.  Give albumin 25% 6 g for every liter removed.  Send fluid for cell count, Gram stain, culture.

## 2022-02-17 LAB — PATHOLOGIST SMEAR REVIEW: Path Review: NEGATIVE

## 2022-02-19 ENCOUNTER — Inpatient Hospital Stay (HOSPITAL_BASED_OUTPATIENT_CLINIC_OR_DEPARTMENT_OTHER)
Admission: EM | Admit: 2022-02-19 | Discharge: 2022-02-22 | DRG: 373 | Disposition: A | Discharge status: Home / Self Care | Payer: BC Managed Care – PPO | Attending: Internal Medicine | Admitting: Internal Medicine

## 2022-02-19 ENCOUNTER — Other Ambulatory Visit: Payer: Self-pay

## 2022-02-19 ENCOUNTER — Encounter (HOSPITAL_BASED_OUTPATIENT_CLINIC_OR_DEPARTMENT_OTHER): Payer: Self-pay

## 2022-02-19 ENCOUNTER — Emergency Department (HOSPITAL_BASED_OUTPATIENT_CLINIC_OR_DEPARTMENT_OTHER): Payer: BC Managed Care – PPO

## 2022-02-19 DIAGNOSIS — E1165 Type 2 diabetes mellitus with hyperglycemia: Secondary | ICD-10-CM | POA: Diagnosis not present

## 2022-02-19 DIAGNOSIS — K658 Other peritonitis: Secondary | ICD-10-CM | POA: Diagnosis not present

## 2022-02-19 DIAGNOSIS — R569 Unspecified convulsions: Secondary | ICD-10-CM | POA: Diagnosis not present

## 2022-02-19 DIAGNOSIS — Z9049 Acquired absence of other specified parts of digestive tract: Secondary | ICD-10-CM | POA: Diagnosis not present

## 2022-02-19 DIAGNOSIS — K7460 Unspecified cirrhosis of liver: Secondary | ICD-10-CM | POA: Diagnosis not present

## 2022-02-19 DIAGNOSIS — F32A Depression, unspecified: Secondary | ICD-10-CM | POA: Diagnosis not present

## 2022-02-19 DIAGNOSIS — K7031 Alcoholic cirrhosis of liver with ascites: Secondary | ICD-10-CM | POA: Diagnosis present

## 2022-02-19 DIAGNOSIS — B9689 Other specified bacterial agents as the cause of diseases classified elsewhere: Secondary | ICD-10-CM | POA: Diagnosis not present

## 2022-02-19 DIAGNOSIS — K7682 Hepatic encephalopathy: Secondary | ICD-10-CM | POA: Diagnosis not present

## 2022-02-19 DIAGNOSIS — R4189 Other symptoms and signs involving cognitive functions and awareness: Secondary | ICD-10-CM | POA: Diagnosis not present

## 2022-02-19 DIAGNOSIS — Z79899 Other long term (current) drug therapy: Secondary | ICD-10-CM

## 2022-02-19 DIAGNOSIS — R1010 Upper abdominal pain, unspecified: Secondary | ICD-10-CM | POA: Diagnosis not present

## 2022-02-19 DIAGNOSIS — R188 Other ascites: Secondary | ICD-10-CM | POA: Diagnosis not present

## 2022-02-19 DIAGNOSIS — Z888 Allergy status to other drugs, medicaments and biological substances status: Secondary | ICD-10-CM | POA: Diagnosis not present

## 2022-02-19 DIAGNOSIS — Z87891 Personal history of nicotine dependence: Secondary | ICD-10-CM

## 2022-02-19 DIAGNOSIS — Z79890 Hormone replacement therapy (postmenopausal): Secondary | ICD-10-CM | POA: Diagnosis not present

## 2022-02-19 DIAGNOSIS — R1012 Left upper quadrant pain: Secondary | ICD-10-CM | POA: Diagnosis not present

## 2022-02-19 DIAGNOSIS — Z8669 Personal history of other diseases of the nervous system and sense organs: Secondary | ICD-10-CM

## 2022-02-19 DIAGNOSIS — K652 Spontaneous bacterial peritonitis: Principal | ICD-10-CM | POA: Diagnosis present

## 2022-02-19 DIAGNOSIS — Z87898 Personal history of other specified conditions: Secondary | ICD-10-CM

## 2022-02-19 DIAGNOSIS — Z880 Allergy status to penicillin: Secondary | ICD-10-CM | POA: Diagnosis not present

## 2022-02-19 DIAGNOSIS — R109 Unspecified abdominal pain: Secondary | ICD-10-CM | POA: Diagnosis not present

## 2022-02-19 DIAGNOSIS — Z83438 Family history of other disorder of lipoprotein metabolism and other lipidemia: Secondary | ICD-10-CM

## 2022-02-19 DIAGNOSIS — F419 Anxiety disorder, unspecified: Secondary | ICD-10-CM | POA: Diagnosis present

## 2022-02-19 DIAGNOSIS — E785 Hyperlipidemia, unspecified: Secondary | ICD-10-CM | POA: Diagnosis present

## 2022-02-19 DIAGNOSIS — I700 Atherosclerosis of aorta: Secondary | ICD-10-CM | POA: Diagnosis not present

## 2022-02-19 DIAGNOSIS — E039 Hypothyroidism, unspecified: Secondary | ICD-10-CM | POA: Diagnosis not present

## 2022-02-19 DIAGNOSIS — I10 Essential (primary) hypertension: Secondary | ICD-10-CM | POA: Diagnosis not present

## 2022-02-19 DIAGNOSIS — K7689 Other specified diseases of liver: Secondary | ICD-10-CM | POA: Diagnosis not present

## 2022-02-19 DIAGNOSIS — E119 Type 2 diabetes mellitus without complications: Secondary | ICD-10-CM | POA: Diagnosis present

## 2022-02-19 DIAGNOSIS — I81 Portal vein thrombosis: Secondary | ICD-10-CM | POA: Diagnosis not present

## 2022-02-19 LAB — COMPREHENSIVE METABOLIC PANEL
ALT: 28 U/L (ref 0–44)
AST: 43 U/L — ABNORMAL HIGH (ref 15–41)
Albumin: 4.2 g/dL (ref 3.5–5.0)
Alkaline Phosphatase: 118 U/L (ref 38–126)
Anion gap: 10 (ref 5–15)
BUN: 21 mg/dL — ABNORMAL HIGH (ref 6–20)
CO2: 24 mmol/L (ref 22–32)
Calcium: 9.9 mg/dL (ref 8.9–10.3)
Chloride: 93 mmol/L — ABNORMAL LOW (ref 98–111)
Creatinine, Ser: 1.28 mg/dL — ABNORMAL HIGH (ref 0.61–1.24)
GFR, Estimated: >60 mL/min (ref >60)
Glucose, Bld: 106 mg/dL — ABNORMAL HIGH (ref 70–99)
Potassium: 4.5 mmol/L (ref 3.5–5.1)
Sodium: 127 mmol/L — ABNORMAL LOW (ref 135–145)
Total Bilirubin: 4.7 mg/dL — ABNORMAL HIGH (ref 0.3–1.2)
Total Protein: 6.7 g/dL (ref 6.5–8.1)

## 2022-02-19 LAB — CBC
HCT: 32.2 % — ABNORMAL LOW (ref 39.0–52.0)
Hemoglobin: 11.9 g/dL — ABNORMAL LOW (ref 13.0–17.0)
MCH: 36 pg — ABNORMAL HIGH (ref 26.0–34.0)
MCHC: 37 g/dL — ABNORMAL HIGH (ref 30.0–36.0)
MCV: 97.3 fL (ref 80.0–100.0)
Platelets: 174 10*3/uL (ref 150–400)
RBC: 3.31 MIL/uL — ABNORMAL LOW (ref 4.22–5.81)
RDW: 14 % (ref 11.5–15.5)
WBC: 12.4 10*3/uL — ABNORMAL HIGH (ref 4.0–10.5)
nRBC: 0 % (ref 0.0–0.2)

## 2022-02-19 LAB — URINALYSIS, ROUTINE W REFLEX MICROSCOPIC
Bilirubin Urine: NEGATIVE
Glucose, UA: NEGATIVE mg/dL
Hgb urine dipstick: NEGATIVE
Ketones, ur: NEGATIVE mg/dL
Leukocytes,Ua: NEGATIVE
Nitrite: NEGATIVE
Protein, ur: NEGATIVE mg/dL
Specific Gravity, Urine: 1.029 (ref 1.005–1.030)
pH: 5.5 (ref 5.0–8.0)

## 2022-02-19 LAB — PROTIME-INR
INR: 1.6 — ABNORMAL HIGH (ref 0.8–1.2)
Prothrombin Time: 19 seconds — ABNORMAL HIGH (ref 11.4–15.2)

## 2022-02-19 LAB — AMMONIA: Ammonia: 77 umol/L — ABNORMAL HIGH (ref 9–35)

## 2022-02-19 LAB — LIPASE, BLOOD: Lipase: 99 U/L — ABNORMAL HIGH (ref 11–51)

## 2022-02-19 MED ORDER — ESCITALOPRAM OXALATE 10 MG PO TABS
5.0000 mg | ORAL_TABLET | Freq: Every day | ORAL | Status: DC
  Administered 2022-02-20 – 2022-02-22 (×3): 5 mg via ORAL
  Filled 2022-02-19 (×3): qty 1

## 2022-02-19 MED ORDER — ONDANSETRON HCL 4 MG/2ML IJ SOLN: 4.0000 mg | Freq: Four times a day (QID) | INTRAMUSCULAR | Status: DC | PRN

## 2022-02-19 MED ORDER — MORPHINE SULFATE (PF) 2 MG/ML IV SOLN
2.0000 mg | INTRAVENOUS | Status: DC | PRN
  Administered 2022-02-19 – 2022-02-20 (×2): 4 mg via INTRAVENOUS
  Administered 2022-02-20 – 2022-02-22 (×4): 2 mg via INTRAVENOUS
  Filled 2022-02-19 (×4): qty 1
  Filled 2022-02-19 (×2): qty 2

## 2022-02-19 MED ORDER — ONDANSETRON HCL 4 MG PO TABS: 4.0000 mg | ORAL_TABLET | Freq: Four times a day (QID) | ORAL | Status: DC | PRN

## 2022-02-19 MED ORDER — CLONAZEPAM 0.5 MG PO TABS
0.5000 mg | ORAL_TABLET | Freq: Every day | ORAL | Status: DC
  Administered 2022-02-19 – 2022-02-21 (×3): 0.5 mg via ORAL
  Filled 2022-02-19 (×3): qty 1

## 2022-02-19 MED ORDER — EZETIMIBE 10 MG PO TABS
10.0000 mg | ORAL_TABLET | Freq: Every day | ORAL | Status: DC
  Administered 2022-02-20 – 2022-02-22 (×3): 10 mg via ORAL
  Filled 2022-02-19 (×3): qty 1

## 2022-02-19 MED ORDER — METRONIDAZOLE 500 MG PO TABS
500.0000 mg | ORAL_TABLET | Freq: Three times a day (TID) | ORAL | Status: DC
  Administered 2022-02-19: 500 mg via ORAL
  Filled 2022-02-19: qty 1

## 2022-02-19 MED ORDER — PANTOPRAZOLE SODIUM 20 MG PO TBEC
20.0000 mg | DELAYED_RELEASE_TABLET | Freq: Two times a day (BID) | ORAL | Status: DC
  Administered 2022-02-19 – 2022-02-22 (×6): 20 mg via ORAL
  Filled 2022-02-19 (×6): qty 1

## 2022-02-19 MED ORDER — SODIUM CHLORIDE 0.9 % IV SOLN
1.0000 g | Freq: Once | INTRAVENOUS | Status: DC
  Filled 2022-02-19: qty 10

## 2022-02-19 MED ORDER — COLESEVELAM HCL 625 MG PO TABS
625.0000 mg | ORAL_TABLET | Freq: Every day | ORAL | Status: DC
  Administered 2022-02-21 – 2022-02-22 (×2): 625 mg via ORAL
  Filled 2022-02-19 (×4): qty 1

## 2022-02-19 MED ORDER — MORPHINE SULFATE (PF) 4 MG/ML IV SOLN
4.0000 mg | Freq: Once | INTRAVENOUS | Status: AC
  Administered 2022-02-19: 4 mg via INTRAVENOUS
  Filled 2022-02-19: qty 1

## 2022-02-19 MED ORDER — LACTULOSE 10 GM/15ML PO SOLN
10.0000 g | Freq: Two times a day (BID) | ORAL | Status: DC | PRN
  Administered 2022-02-19: 10 g via ORAL
  Filled 2022-02-19: qty 15

## 2022-02-19 MED ORDER — SODIUM CHLORIDE 0.9 % IV SOLN
1.0000 g | Freq: Once | INTRAVENOUS | Status: AC
  Administered 2022-02-19: 1 g via INTRAVENOUS
  Filled 2022-02-19: qty 10

## 2022-02-19 MED ORDER — BUSPIRONE HCL 5 MG PO TABS
7.5000 mg | ORAL_TABLET | Freq: Every day | ORAL | Status: DC
Start: 2022-02-20 — End: 2022-02-22
  Administered 2022-02-20 – 2022-02-22 (×3): 7.5 mg via ORAL
  Filled 2022-02-19 (×3): qty 2

## 2022-02-19 MED ORDER — FUROSEMIDE 40 MG PO TABS: 80.0000 mg | ORAL_TABLET | Freq: Every day | ORAL | Status: DC

## 2022-02-19 MED ORDER — METRONIDAZOLE 500 MG/100ML IV SOLN
500.0000 mg | Freq: Once | INTRAVENOUS | Status: AC
  Administered 2022-02-19: 500 mg via INTRAVENOUS
  Filled 2022-02-19: qty 100

## 2022-02-19 MED ORDER — LEVETIRACETAM ER 500 MG PO TB24
1000.0000 mg | ORAL_TABLET | Freq: Every day | ORAL | Status: DC
  Administered 2022-02-20 – 2022-02-21 (×3): 1000 mg via ORAL
  Filled 2022-02-19 (×3): qty 2

## 2022-02-19 MED ORDER — IOHEXOL 300 MG/ML  SOLN
100.0000 mL | Freq: Once | INTRAMUSCULAR | Status: AC | PRN
  Administered 2022-02-19: 80 mL via INTRAVENOUS

## 2022-02-19 MED ORDER — SODIUM CHLORIDE 0.9 % IV SOLN
2.0000 g | INTRAVENOUS | Status: DC
  Administered 2022-02-20 – 2022-02-21 (×2): 2 g via INTRAVENOUS
  Filled 2022-02-19 (×2): qty 20

## 2022-02-19 MED ORDER — RIFAXIMIN 550 MG PO TABS
550.0000 mg | ORAL_TABLET | Freq: Two times a day (BID) | ORAL | Status: DC
  Administered 2022-02-19 – 2022-02-22 (×6): 550 mg via ORAL
  Filled 2022-02-19 (×6): qty 1

## 2022-02-19 MED ORDER — BUSPIRONE HCL 15 MG PO TABS: 15.0000 mg | ORAL_TABLET | ORAL | Status: DC

## 2022-02-19 MED ORDER — LEVOTHYROXINE SODIUM 88 MCG PO TABS
88.0000 ug | ORAL_TABLET | Freq: Every day | ORAL | Status: DC
  Administered 2022-02-20: 88 ug via ORAL
  Filled 2022-02-19 (×3): qty 1

## 2022-02-19 MED ORDER — BUSPIRONE HCL 5 MG PO TABS
15.0000 mg | ORAL_TABLET | Freq: Every evening | ORAL | Status: DC
Start: 2022-02-20 — End: 2022-02-22
  Administered 2022-02-20 – 2022-02-21 (×2): 15 mg via ORAL
  Filled 2022-02-19 (×2): qty 1

## 2022-02-19 MED ORDER — THIAMINE HCL 100 MG PO TABS
100.0000 mg | ORAL_TABLET | Freq: Every day | ORAL | Status: DC
  Administered 2022-02-20 – 2022-02-22 (×3): 100 mg via ORAL
  Filled 2022-02-19 (×3): qty 1

## 2022-02-19 MED ORDER — SPIRONOLACTONE 25 MG PO TABS: 150.0000 mg | ORAL_TABLET | Freq: Every day | ORAL | Status: DC

## 2022-02-19 MED ORDER — LORATADINE 10 MG PO TABS
10.0000 mg | ORAL_TABLET | Freq: Every evening | ORAL | Status: DC
Start: 2022-02-20 — End: 2022-02-22
  Administered 2022-02-20 – 2022-02-21 (×2): 10 mg via ORAL
  Filled 2022-02-19 (×2): qty 1

## 2022-02-19 MED ORDER — LEVOCETIRIZINE DIHYDROCHLORIDE 5 MG PO TABS: 5.0000 mg | ORAL_TABLET | Freq: Every evening | ORAL | Status: DC

## 2022-02-19 NOTE — ED Triage Notes (Signed)
Pt presents POV from home for Left side abd pain since last night. Pt had a thoracentesis Monday at [facility] IR department. Pt reports he's uncomfortable

## 2022-02-19 NOTE — Assessment & Plan Note (Addendum)
Looks like hes had cognative complaints over past few months with his PCP and neurology. Im highly suspicious with his cirrhosis, ammonia of 77 today, prior ammonias in the 70-100 range, etc, that he may have at least subclinical / mild hepatic encephalopathy somewhat more chronically at least contributing to this.  Cont rifaxan  Clelia Schaumann talk with patient about possibly switching lactulose to scheduled if he can tolerate it.

## 2022-02-19 NOTE — Telephone Encounter (Signed)
Spoke with pt's wife and gave her recommendations. Unable to find an available appointment today and advised pt go to an ED or urgent care to be evaluated today.

## 2022-02-19 NOTE — Assessment & Plan Note (Addendum)
Concern for SBP following theraputic paracentesis on Monday. 1. Empiric rocephin 2. IR eval and treat order put into Epic.  IR + Day team to decide on need for repeat paracentesis for diagnosis.

## 2022-02-19 NOTE — Telephone Encounter (Signed)
Pt's last paracentesis was 02/15/22. Pt now complaining of left lower sided abdominal pain. Wife states that pt feels warm to the touch and pt complains of feeling cold. Wife reports there is no redness on abdomen pt just cant get comfortable and was up all night.

## 2022-02-19 NOTE — Assessment & Plan Note (Signed)
Cont home Keppra 

## 2022-02-19 NOTE — Progress Notes (Signed)
Pt has arrived to room 1515  

## 2022-02-19 NOTE — Assessment & Plan Note (Signed)
Cont synthroid 

## 2022-02-19 NOTE — ED Provider Notes (Signed)
MEDCENTER [facility] EMERGENCY DEPT Provider Note   CSN: [id number] Arrival date & time: 02/19/22  1050     History  Chief Complaint  Patient presents with   Abdominal Pain    Andrew Patton is a 61 y.o. male with a history of alcoholic cirrhosis and ascites requiring recurrent paracentesis presenting to the emergency department with abdominal pain.  The patient reports he has weekly paracentesis performed every Monday through his gastroenterology office.  Per medical record review, on his most recent paracentesis, which was Monday, the patient tolerated well, but has been having pain near the puncture site in the left upper quadrant of the abdomen since then.  He said he was restless last night and his wife reports he was complaining of pain.  The patient does receive albumin infusions with his paracentesis.  Medical chart shows that the patient underwent paracentesis on Monday and had 8 L of straw-colored fluid removed.  HPI     Home Medications Prior to Admission medications   Medication Sig Start Date End Date Taking? Authorizing Provider  azelastine (ASTELIN) 0.1 % nasal spray Place 1 spray into both nostrils 2 (two) times daily. Use in each nostril as directed    [provider]  busPIRone (BUSPAR) 15 MG tablet Take 15-30 mg by mouth See admin instructions. Take 15 mg by mouth in the morning and 22.5-30 mg after supper    [provider]  clonazePAM (KLONOPIN) 0.5 MG tablet Take 0.5 mg by mouth See admin instructions. Take 0.5 mg by mouth at bedtime and an additional 0.5 mg once a day as needed for palpitations    [provider]  colesevelam (WELCHOL) 625 MG tablet Take 625 mg by mouth daily with breakfast.    [provider]  escitalopram (LEXAPRO) 10 MG tablet Take 0.5 tablets (5 mg total) by mouth daily. 12/10/21   Anson Fret, MD  eszopiclone (LUNESTA) 2 MG TABS tablet Take 2 mg by mouth at bedtime. Take immediately before  bedtime    [provider]  ezetimibe (ZETIA) 10 MG tablet Take 10 mg by mouth daily.    [provider]  fluticasone (FLONASE) 50 MCG/ACT nasal spray Place 1 spray into both nostrils at bedtime. 01/05/13   [provider]  furosemide (LASIX) 80 MG tablet Take 1 tablet (80 mg total) by mouth daily. 12/14/21   Cirigliano, Vito V, DO  ipratropium (ATROVENT) 0.06 % nasal spray Place 1 spray into the nose daily. 02/17/19   [provider]  lactulose (CHRONULAC) 10 GM/15ML solution Take 15 mLs (10 g total) by mouth 2 (two) times daily as needed for mild constipation (for bowel movments 2-3 per day). 07/06/21   Pokhrel, Rebekah [place], MD  levETIRAcetam (KEPPRA XR) 500 MG 24 hr tablet Take 2 tablets (1,000 mg total) by mouth daily. 12/10/21   Anson Fret, MD  Levothyroxine Sodium 88 MCG CAPS Take 88 mcg by mouth daily before breakfast.    [provider]  ondansetron (ZOFRAN ODT) 4 MG disintegrating tablet Take 1 tablet (4 mg total) by mouth every 8 (eight) hours as needed for nausea or vomiting. Patient taking differently: Take 4 mg by mouth every 8 (eight) hours as needed for nausea or vomiting (dissolve orally). 02/08/20   Maxwell Caul, PA-C  pantoprazole (PROTONIX) 20 MG tablet Take 1 tablet (20 mg total) by mouth 2 (two) times daily. Then take 1 tablet by mouth daily 12/08/21   Cirigliano, Vito V, DO  rifaximin [facility])  550 MG TABS tablet Take 1 tablet (550 mg total) by mouth 2 (two) times daily. 12/15/21   Cirigliano, Vito V, DO  spironolactone (ALDACTONE) 100 MG tablet Take 1 tablet (100 mg total) by mouth daily. Take 1 tablet with 50 mg tablet (150 mg total) by mouth daily. 12/08/21   Cirigliano, Vito V, DO  spironolactone (ALDACTONE) 50 MG tablet Take 1 tablet (50 mg total) by mouth daily. Take 1 tablet with 100 mg tablet (150 mg total) by mouth daily. 12/08/21   Cirigliano, Vito V, DO  thiamine 100 MG tablet Take 1 tablet (100 mg total) by mouth daily. 07/06/21    Pokhrel, Rebekah [place], MD  traMADol (ULTRAM) 50 MG tablet Take 25 mg by mouth 3 (three) times daily as needed. 07/30/21   [provider]      Allergies    Crestor [rosuvastatin calcium], Meloxicam, Rosuvastatin, and Penicillins    Review of Systems   Review of Systems  Physical Exam Updated Vital Signs BP (!) 114/56   Pulse 74   Temp 97.9 F (36.6 C)   Resp 14   SpO2 100%  Physical Exam Constitutional:      General: He is not in acute distress.    Comments: Appears fatigued, mildly jaundiced (chronic according to patient wife)  HENT:     Head: Normocephalic and atraumatic.  Eyes:     Conjunctiva/sclera: Conjunctivae normal.     Pupils: Pupils are equal, round, and reactive to light.  Cardiovascular:     Rate and Rhythm: Normal rate and regular rhythm.  Pulmonary:     Effort: Pulmonary effort is normal. No respiratory distress.  Abdominal:     General: There is no distension.     Tenderness: There is no abdominal tenderness.     Comments: Ascites of the abdomen, localized tenderness near puncture site in the left upper quadrant of the abdomen.  No localized erythema or fluctuance around the former puncture site for paracentesis.  No rebound or guarding with palpation of the right side of the abdomen.  Skin:    General: Skin is warm and dry.  Neurological:     General: No focal deficit present.     Mental Status: He is alert. Mental status is at baseline.  Psychiatric:        Mood and Affect: Mood normal.        Behavior: Behavior normal.     ED Results / Procedures / Treatments   Labs (all labs ordered are listed, but only abnormal results are displayed) Labs Reviewed  LIPASE, BLOOD - Abnormal; Notable for the following components:      Result Value   Lipase 99 (*)    All other components within normal limits  COMPREHENSIVE METABOLIC PANEL - Abnormal; Notable for the following components:   Sodium 127 (*)    Chloride 93 (*)    Glucose, Bld 106 (*)    BUN  21 (*)    Creatinine, Ser 1.28 (*)    AST 43 (*)    Total Bilirubin 4.7 (*)    All other components within normal limits  CBC - Abnormal; Notable for the following components:   WBC 12.4 (*)    RBC 3.31 (*)    Hemoglobin 11.9 (*)    HCT 32.2 (*)    MCH 36.0 (*)    MCHC 37.0 (*)    All other components within normal limits  AMMONIA - Abnormal; Notable for the following components:   Ammonia 77 (*)  All other components within normal limits  URINALYSIS, ROUTINE W REFLEX MICROSCOPIC    EKG None  Radiology CT ABDOMEN PELVIS W CONTRAST  Result Date: 02/19/2022 CLINICAL DATA:  Postoperative left upper quadrant abdominal pain. EXAM: CT ABDOMEN AND PELVIS WITH CONTRAST TECHNIQUE: Multidetector CT imaging of the abdomen and pelvis was performed using the standard protocol following bolus administration of intravenous contrast. RADIATION DOSE REDUCTION: This exam was performed according to the departmental dose-optimization program which includes automated exposure control, adjustment of the mA and/or kV according to patient size and/or use of iterative reconstruction technique. CONTRAST:  62mL OMNIPAQUE IOHEXOL 300 MG/ML  SOLN COMPARISON:  Dec 29, 2021. FINDINGS: Lower chest: No acute abnormality. Hepatobiliary: No gallstones or biliary dilatation is noted. Hepatic cirrhosis is noted. No definite focal hepatic abnormality is noted. Pancreas: Unremarkable. No pancreatic ductal dilatation or surrounding inflammatory changes. Spleen: Normal in size without focal abnormality. Adrenals/Urinary Tract: Adrenal glands are unremarkable. Kidneys are normal, without renal calculi, focal lesion, or hydronephrosis. Bladder is unremarkable. Stomach/Bowel: The stomach appears normal. Status post appendectomy. No abnormal bowel dilatation or inflammation is noted. Vascular/Lymphatic: Atherosclerosis of abdominal aorta is noted. Mildly enlarged adenopathy is noted in porta hepatis region most likely secondary to  liver disease. Reproductive: Prostate is unremarkable. Other: Moderate size fat containing periumbilical hernia is noted. Mild ascites is noted. Musculoskeletal: No acute or significant osseous findings. IMPRESSION: Hepatic cirrhosis.  Mild ascites is noted. Moderate size fat containing periumbilical hernia is noted. Aortic Atherosclerosis (ICD10-I70.0). Electronically Signed   By: Lupita Raider M.D.   On: 02/19/2022 13:29    Procedures Procedures    Medications Ordered in ED Medications  metroNIDAZOLE (FLAGYL) IVPB 500 mg (500 mg Intravenous New Bag/Given 02/19/22 1433)  busPIRone (BUSPAR) tablet 15-30 mg (has no administration in time range)  clonazePAM (KLONOPIN) tablet 0.5 mg (has no administration in time range)  colesevelam [facility]) tablet 625 mg (has no administration in time range)  escitalopram (LEXAPRO) tablet 5 mg (has no administration in time range)  ezetimibe (ZETIA) tablet 10 mg (has no administration in time range)  furosemide (LASIX) tablet 80 mg (has no administration in time range)  lactulose (CHRONULAC) 10 GM/15ML solution 10 g (has no administration in time range)  levETIRAcetam (KEPPRA XR) 24 hr tablet 1,000 mg (has no administration in time range)  levocetirizine (XYZAL) tablet 5 mg (has no administration in time range)  Levothyroxine Sodium CAPS 88 mcg (has no administration in time range)  pantoprazole (PROTONIX) EC tablet 20 mg (has no administration in time range)  rifaximin (XIFAXAN) tablet 550 mg (has no administration in time range)  thiamine tablet 100 mg (has no administration in time range)  spironolactone (ALDACTONE) tablet 150 mg (has no administration in time range)  metroNIDAZOLE (FLAGYL) tablet 500 mg (has no administration in time range)  cefTRIAXone (ROCEPHIN) 1 g in sodium chloride 0.9 % 100 mL IVPB (has no administration in time range)  iohexol (OMNIPAQUE) 300 MG/ML solution 100 mL (80 mLs Intravenous Contrast Given 02/19/22 1307)  cefTRIAXone  (ROCEPHIN) 1 g in sodium chloride 0.9 % 100 mL IVPB (1 g Intravenous New Bag/Given 02/19/22 1433)  morphine (PF) 4 MG/ML injection 4 mg (4 mg Intravenous Given 02/19/22 1512)    ED Course/ Medical Decision Making/ A&P Clinical Course as of 02/19/22 1516  Fri Feb 19, 2022  1344 CT with no gross abnormalities, only trace ascites visible.  Will discuss with IR team - paged attending [MT]  1356 I spoke to Dr. Deanne Coffer from  IR at [facility] regarding possible postprocedural complication, and he agrees with the plan for medical admission on antibiotics and their team will assess the patient once he arrives at [facility].  At this time there is only an extremely small amount of ascites present, which I think would be quite difficult for me to perform a blind diagnostic paracentesis on.  Their team will assess the patient when he arrives at the hospital and determine need for paracentesis.  Rocephin and Flagyl been ordered in the meantime to cover for intra-abdominal organisms [MT]  1500 Admitted to hospitalist [MT]    Clinical Course User Index [MT] Dreanna Kyllo, Kermit Balo, MD                           Medical Decision Making Amount and/or Complexity of Data Reviewed Labs: ordered. Radiology: ordered.  Risk OTC drugs. Prescription drug management. Decision regarding hospitalization.   This patient presents to the ED with concern for abdominal pain now 4 days status post paracentesis.. This involves an extensive number of treatment options, and is a complaint that carries with it a high risk of complications and morbidity.  The differential diagnosis includes procedural complication including perforation of bowel or surrounding structures versus infection versus other  Co-morbidities that complicate the patient evaluation: History of alcoholic cirrhosis and recurring ascites requiring paracentesis weekly, raises risk for infection and complications of repeat procedures  Additional history obtained from  patient's wife at bedside  External records from outside source obtained and reviewed including paracentesis report from earlier this week as noted above  I ordered and personally interpreted labs.  The pertinent results include: CMP near baseline levels.  T bili level always chronically elevated, 4.7 today.  White blood cell count is mildly elevated at 12.4.  Lipase also elevated at 99, which is higher than his most recent baselines.  Ammonia also chronically elevated, today 77.  On abdominal exam his pain appears localized near the puncture site in the left upper quadrant.  He also reports some nausea has not had a regular bowel movement in 24 hours.  I have ordered a CT scan to evaluate for potential bowel injury, obstruction or ileus.  I would have a lower suspicion for SBP, although cannot be excluded completely at this time, with no rebound or guarding on exam.  I ordered imaging studies including CT abdomen pelvis with contrast I independently visualized and interpreted imaging which showed no acute abnormalities to explain the patient's symptoms.  No evidence of bowel perforation.  I agree with the radiologist interpretation  I ordered medication including IV morphine for pain  I have reviewed the patients home medicines and have made adjustments as needed  After the interventions noted above, I reevaluated the patient and found that they have: improved  Dispostion:  After consideration of the diagnostic results and the patients response to treatment, I feel that the patent would benefit from medical admission.         Final Clinical Impression(s) / ED Diagnoses Final diagnoses:  Left upper quadrant abdominal pain  Cirrhosis of liver without ascites, unspecified hepatic cirrhosis type [facility])    Rx / DC Orders ED Discharge Orders     None         Terald Sleeper, MD 02/19/22 1517

## 2022-02-19 NOTE — H&P (Signed)
History and Physical    Patient: Andrew Patton ZOX:[id number] DOB: 08/24/1960 DOA: 02/19/2022 DOS: the patient was seen and examined on 02/19/2022 PCP: [name], MD  Patient coming from: Home  Chief Complaint:  Chief Complaint  Patient presents with   Abdominal Pain   HPI: Andrew Patton is a 61 y.o. male with medical history significant of HTN, DM2, decompensated EtOH cirrhosis.  Pt had theraputic paracentesis on Monday, large volume of fluid removed. 8L.  Since that time having abd pain especially around site of fluid removal.  Lab work on fluid removed on Monday is unimpressive.  No fevers, chills.  WBC 12k today in ED.  Pt admitted due to concern for SBP. Review of Systems: As mentioned in the history of present illness. All other systems reviewed and are negative. Past Medical History:  Diagnosis Date   Allergy    Anxiety    Arthritis    Ascites    Diabetes (HCC)    Elevated transaminase level    Fatigue    Fatty liver    Hyperlipidemia    Hypertension    "under control", not on any blood pressure medication, has lost 60 lbs   Hypothyroidism    on synthroid   Inguinal muscle strain    Insomnia    Left knee pain    "long time ago"   Multinodular goiter    Obesity    Pneumonia    Psoriasis    Tinnitus    Past Surgical History:  Procedure Laterality Date   APPENDECTOMY  1976   FOOT SURGERY Bilateral 1975   IR PARACENTESIS  11/25/2021   IR PARACENTESIS  12/02/2021   IR PARACENTESIS  12/17/2021   IR PARACENTESIS  01/05/2022   IR PARACENTESIS  02/04/2022   IR PARACENTESIS  02/15/2022   IR RADIOLOGIST EVAL & MGMT  12/17/2021   IR RADIOLOGIST EVAL & MGMT  12/31/2021   NOSE SURGERY     ROTATOR CUFF REPAIR Left    left   SHOULDER SURGERY Right 12/20/2019   [facility]    Social History:  reports that he quit smoking about 27 years ago. His smoking use included cigarettes. He has quit using smokeless tobacco.  His smokeless tobacco use  included chew. He reports that he does not currently use alcohol after a past usage of about 2.0 standard drinks of alcohol per week. He reports that he does not use drugs.  Allergies  Allergen Reactions   Crestor [Rosuvastatin Calcium] Other (See Comments)    Elevated liver enzymes   Meloxicam Other (See Comments)    Leg pain   Rosuvastatin Other (See Comments)    Elevated liver enzymes   Penicillins Rash    Has patient had a PCN reaction causing immediate rash, facial/tongue/throat swelling, SOB or lightheadedness with hypotension: No Has patient had a PCN reaction causing severe rash involving mucus membranes or skin necrosis: No Has patient had a PCN reaction that required hospitalization: No Has patient had a PCN reaction occurring within the last 10 years: No If all of the above answers are "NO", then may proceed with Cephalosporin use. Other reaction(s): rash    Family History  Problem Relation Age of Onset   Breast cancer Mother    Hyperlipidemia Mother    Other Mother        ? form of parkinson's    Parkinsonism Mother    Bladder Cancer Father    Parkinson's disease Father    Memory loss  Maternal Grandmother        in her 36s   Birth defects Maternal Grandfather    Birth defects Paternal Grandmother    Colon cancer Neg Hx    Esophageal cancer Neg Hx    Rectal cancer Neg Hx    Stomach cancer Neg Hx    Colon polyps Neg Hx    Dementia Neg Hx    Alzheimer's disease Neg Hx     Prior to Admission medications   Medication Sig Start Date End Date Taking? Authorizing Provider  busPIRone (BUSPAR) 15 MG tablet Take 7.5-15 mg by mouth 2 (two) times daily. Take 1/2 tablet (7.5 mg) in the morning and Take 1 tablet (15 mg) at supper   Yes [provider]  clonazePAM (KLONOPIN) 0.5 MG tablet Take 0.5 mg by mouth at bedtime.   Yes [provider]  colesevelam (WELCHOL) 625 MG tablet Take 625 mg by mouth daily with breakfast.   Yes [provider]   escitalopram (LEXAPRO) 5 MG tablet Take 5 mg by mouth daily. 02/14/22  Yes [provider]  eszopiclone (LUNESTA) 2 MG TABS tablet Take 2 mg by mouth at bedtime. Take immediately before bedtime   Yes [provider]  ezetimibe (ZETIA) 10 MG tablet Take 10 mg by mouth daily.   Yes [provider]  fluticasone (FLONASE) 50 MCG/ACT nasal spray Place 1 spray into both nostrils at bedtime. 01/05/13  Yes [provider]  folic acid (FOLVITE) 1 MG tablet Take 1 mg by mouth at bedtime.   Yes [provider]  furosemide (LASIX) 80 MG tablet Take 1 tablet (80 mg total) by mouth daily. 12/14/21  Yes Cirigliano, Vito V, DO  lactulose (CHRONULAC) 10 GM/15ML solution Take 15 mLs (10 g total) by mouth 2 (two) times daily as needed for mild constipation (for bowel movments 2-3 per day). 07/06/21  Yes Pokhrel, Laxman, MD  levETIRAcetam (KEPPRA XR) 500 MG 24 hr tablet Take 2 tablets (1,000 mg total) by mouth daily. 12/10/21  Yes Anson Fret, MD  levothyroxine (SYNTHROID) 88 MCG tablet Take 88 mcg by mouth every morning. 02/14/22  Yes [provider]  ondansetron (ZOFRAN ODT) 4 MG disintegrating tablet Take 1 tablet (4 mg total) by mouth every 8 (eight) hours as needed for nausea or vomiting. Patient taking differently: Take 4 mg by mouth every 8 (eight) hours as needed for nausea or vomiting (dissolve orally). 02/08/20  Yes Maxwell Caul, PA-C  pantoprazole (PROTONIX) 20 MG tablet Take 1 tablet (20 mg total) by mouth 2 (two) times daily. Then take 1 tablet by mouth daily Patient taking differently: Take 20 mg by mouth daily. Then take 1 tablet by mouth daily 12/08/21  Yes Cirigliano, Vito V, DO  rifaximin (XIFAXAN) 550 MG TABS tablet Take 1 tablet (550 mg total) by mouth 2 (two) times daily. 12/15/21  Yes Cirigliano, Vito V, DO  spironolactone (ALDACTONE) 100 MG tablet Take 1 tablet (100 mg total) by mouth daily. Take 1 tablet with 50 mg tablet (150 mg total) by mouth  daily. Patient taking differently: Take 200 mg by mouth daily. 12/08/21  Yes Cirigliano, Vito V, DO  thiamine 100 MG tablet Take 1 tablet (100 mg total) by mouth daily. 07/06/21  Yes Pokhrel, Laxman, MD  traMADol (ULTRAM) 50 MG tablet Take 50 mg by mouth at bedtime. 07/30/21  Yes [provider]  escitalopram (LEXAPRO) 10 MG tablet Take 0.5 tablets (5 mg total) by mouth daily. Patient not taking:  Reported on 02/19/2022 12/10/21   Anson Fret, MD  ipratropium (ATROVENT) 0.06 % nasal spray Place 1 spray into the nose daily. Patient not taking: Reported on 02/19/2022 02/17/19   [provider]  spironolactone (ALDACTONE) 50 MG tablet Take 1 tablet (50 mg total) by mouth daily. Take 1 tablet with 100 mg tablet (150 mg total) by mouth daily. Patient not taking: Reported on 02/19/2022 12/08/21   Shellia Cleverly, DO    Physical Exam: Vitals:   02/19/22 1852 02/19/22 1915 02/19/22 2032 02/19/22 2114  BP:  (!) 123/57 129/64   Pulse:  82 72   Resp:  12 18   Temp: 98 F (36.7 C)  98.1 F (36.7 C)   TempSrc: Oral  Oral   SpO2:  100% 100% 100%   Constitutional: NAD, calm, comfortable Eyes: PERRL, lids and conjunctivae normal ENMT: Mucous membranes are moist. Posterior pharynx clear of any exudate or lesions.Normal dentition.  Neck: normal, supple, no masses, no thyromegaly Respiratory: clear to auscultation bilaterally, no wheezing, no crackles. Normal respiratory effort. No accessory muscle use.  Cardiovascular: Regular rate and rhythm, no murmurs / rubs / gallops. No extremity edema. 2+ pedal pulses. No carotid bruits.  Abdomen: Mild distention and ascites, TTP localized near puncture site LUQ, no erythema, no rebound, no guarding. Musculoskeletal: no clubbing / cyanosis. No joint deformity upper and lower extremities. Good ROM, no contractures. Normal muscle tone.  Skin: no rashes, lesions, ulcers. No induration Neurologic: CN 2-12 grossly intact. Sensation intact, DTR normal.  Strength 5/5 in all 4.  Psychiatric: Normal judgment and insight. Alert and oriented x 3. Normal mood.   Data Reviewed:       Latest Ref Rng & Units 02/19/2022   11:16 AM 07/06/2021    4:55 AM 07/05/2021    5:42 AM  CBC  WBC 4.0 - 10.5 K/uL 12.4  5.5  5.2   Hemoglobin 13.0 - 17.0 g/dL 42.3  9.5  9.9   Hematocrit 39.0 - 52.0 % 32.2  25.5  26.9   Platelets 150 - 400 K/uL 174  127  136       Latest Ref Rng & Units 02/19/2022   11:16 AM 02/05/2022    1:55 PM 12/18/2021   10:58 AM  CMP  Glucose 70 - 99 mg/dL 536  144  315   BUN 6 - 20 mg/dL 21  15  10    Creatinine 0.61 - 1.24 mg/dL  4.00  8.67   Sodium 135 - 145 mmol/L 127  127  133   Potassium 3.5 - 5.1 mmol/L 4.5  3.9  3.5   Chloride 98 - 111 mmol/L 93  95  99   CO2 22 - 32 mmol/L 24  24  27    Calcium 8.9 - 10.3 mg/dL 9.9  8.6  8.6   Total Protein 6.5 - 8.1 g/dL 6.7     Total Bilirubin 0.3 - 1.2 mg/dL 4.7     Alkaline Phos 38 - 126 U/L 118     AST 15 - 41 U/L 43     ALT 0 - 44 U/L 28      Ammonia 77  INR just came back at 1.6  CT AP: IMPRESSION: Hepatic cirrhosis.  Mild ascites is noted.   Moderate size fat containing periumbilical hernia is noted.   Aortic Atherosclerosis (ICD10-I70.0).  Assessment and Plan: * Abdominal pain Concern for SBP following theraputic paracentesis on Monday. Empiric rocephin IR eval and treat order put into  Epic.  IR + Day team to decide on need for repeat paracentesis for diagnosis.  Alcoholic cirrhosis of liver with ascites (HCC) Continue home Rifaxan Looks like hes only on lactulose PRN from med rec?  Ammonia 77 today, keep eye out for hepatic encephalopathy. Looks mildly pre-renal today with Creat 1.2 up from 0.9 baseline -> ? Mild dehydration in setting of decreased PO intake due to abd pain?  Hold home lasix and aldactone for the moment to 'gently hydrate' the patient. INR 1.6 previously (81 days ago)  Checking INR today  SCDs as DVT ppx for the moment  History of  seizure Cont home Keppra   Cognitive deficits Looks like hes had cognative complaints over past few months with his PCP and neurology. Im highly suspicious with his cirrhosis, ammonia of 77 today, prior ammonias in the 70-100 range, etc, that he may have at least subclinical / mild hepatic encephalopathy somewhat more chronically at least contributing to this.  Cont rifaxan  Clelia Schaumann talk with patient about possibly switching lactulose to scheduled if he can tolerate it.  Uncontrolled type 2 diabetes mellitus with hyperglycemia (HCC) Looks to be diet controlled, not on any home meds that I see.  Hypothyroidism Cont synthroid      Advance Care Planning:   Code Status: Full Code  Consults: None  Family Communication: No family in room  Severity of Illness: The appropriate patient status for this patient is OBSERVATION. Observation status is judged to be reasonable and necessary in order to provide the required intensity of service to ensure the patient's safety. The patient's presenting symptoms, physical exam findings, and initial radiographic and laboratory data in the context of their medical condition is felt to place them at decreased risk for further clinical deterioration. Furthermore, it is anticipated that the patient will be medically stable for discharge from the hospital within 2 midnights of admission.   Author: Hillary Bow., DO 02/19/2022 10:18 PM  For on call review [email or web address].

## 2022-02-19 NOTE — Assessment & Plan Note (Addendum)
Continue home Rifaxan Looks like hes only on lactulose PRN from med rec?  Ammonia 77 today, keep eye out for hepatic encephalopathy. Looks mildly pre-renal today with Creat 1.2 up from 0.9 baseline -> ? Mild dehydration in setting of decreased PO intake due to abd pain?  Hold home lasix and aldactone for the moment to 'gently hydrate' the patient. INR 1.6 previously (81 days ago)  Checking INR today  SCDs as DVT ppx for the moment

## 2022-02-19 NOTE — Assessment & Plan Note (Signed)
Looks to be diet controlled, not on any home meds that I see.

## 2022-02-20 ENCOUNTER — Encounter (HOSPITAL_COMMUNITY): Payer: Self-pay | Admitting: Internal Medicine

## 2022-02-20 ENCOUNTER — Inpatient Hospital Stay (HOSPITAL_COMMUNITY): Payer: BC Managed Care – PPO

## 2022-02-20 DIAGNOSIS — F419 Anxiety disorder, unspecified: Secondary | ICD-10-CM | POA: Diagnosis present

## 2022-02-20 DIAGNOSIS — R569 Unspecified convulsions: Secondary | ICD-10-CM | POA: Diagnosis present

## 2022-02-20 DIAGNOSIS — Z880 Allergy status to penicillin: Secondary | ICD-10-CM | POA: Diagnosis not present

## 2022-02-20 DIAGNOSIS — Z79890 Hormone replacement therapy (postmenopausal): Secondary | ICD-10-CM | POA: Diagnosis not present

## 2022-02-20 DIAGNOSIS — K7031 Alcoholic cirrhosis of liver with ascites: Secondary | ICD-10-CM | POA: Diagnosis present

## 2022-02-20 DIAGNOSIS — Z87891 Personal history of nicotine dependence: Secondary | ICD-10-CM | POA: Diagnosis not present

## 2022-02-20 DIAGNOSIS — R4189 Other symptoms and signs involving cognitive functions and awareness: Secondary | ICD-10-CM | POA: Diagnosis present

## 2022-02-20 DIAGNOSIS — Z79899 Other long term (current) drug therapy: Secondary | ICD-10-CM | POA: Diagnosis not present

## 2022-02-20 DIAGNOSIS — I10 Essential (primary) hypertension: Secondary | ICD-10-CM | POA: Diagnosis present

## 2022-02-20 DIAGNOSIS — R109 Unspecified abdominal pain: Secondary | ICD-10-CM | POA: Diagnosis present

## 2022-02-20 DIAGNOSIS — K7682 Hepatic encephalopathy: Secondary | ICD-10-CM | POA: Diagnosis present

## 2022-02-20 DIAGNOSIS — E1165 Type 2 diabetes mellitus with hyperglycemia: Secondary | ICD-10-CM | POA: Diagnosis present

## 2022-02-20 DIAGNOSIS — B9689 Other specified bacterial agents as the cause of diseases classified elsewhere: Secondary | ICD-10-CM | POA: Diagnosis present

## 2022-02-20 DIAGNOSIS — Z83438 Family history of other disorder of lipoprotein metabolism and other lipidemia: Secondary | ICD-10-CM | POA: Diagnosis not present

## 2022-02-20 DIAGNOSIS — F32A Depression, unspecified: Secondary | ICD-10-CM | POA: Diagnosis present

## 2022-02-20 DIAGNOSIS — R1010 Upper abdominal pain, unspecified: Secondary | ICD-10-CM | POA: Diagnosis not present

## 2022-02-20 DIAGNOSIS — K658 Other peritonitis: Secondary | ICD-10-CM | POA: Diagnosis present

## 2022-02-20 DIAGNOSIS — E039 Hypothyroidism, unspecified: Secondary | ICD-10-CM | POA: Diagnosis present

## 2022-02-20 DIAGNOSIS — Z888 Allergy status to other drugs, medicaments and biological substances status: Secondary | ICD-10-CM | POA: Diagnosis not present

## 2022-02-20 DIAGNOSIS — E785 Hyperlipidemia, unspecified: Secondary | ICD-10-CM | POA: Diagnosis present

## 2022-02-20 DIAGNOSIS — Z9049 Acquired absence of other specified parts of digestive tract: Secondary | ICD-10-CM | POA: Diagnosis not present

## 2022-02-20 LAB — COMPREHENSIVE METABOLIC PANEL
ALT: 27 U/L (ref 0–44)
AST: 38 U/L (ref 15–41)
Albumin: 3.1 g/dL — ABNORMAL LOW (ref 3.5–5.0)
Alkaline Phosphatase: 85 U/L (ref 38–126)
Anion gap: 7 (ref 5–15)
BUN: 19 mg/dL (ref 6–20)
CO2: 23 mmol/L (ref 22–32)
Calcium: 8.5 mg/dL — ABNORMAL LOW (ref 8.9–10.3)
Chloride: 100 mmol/L (ref 98–111)
Creatinine, Ser: 0.97 mg/dL (ref 0.61–1.24)
GFR, Estimated: >60 mL/min (ref >60)
Glucose, Bld: 115 mg/dL — ABNORMAL HIGH (ref 70–99)
Potassium: 4.2 mmol/L (ref 3.5–5.1)
Sodium: 130 mmol/L — ABNORMAL LOW (ref 135–145)
Total Bilirubin: 3.5 mg/dL — ABNORMAL HIGH (ref 0.3–1.2)
Total Protein: 5.5 g/dL — ABNORMAL LOW (ref 6.5–8.1)

## 2022-02-20 LAB — CULTURE, BODY FLUID W GRAM STAIN -BOTTLE: Culture: NO GROWTH

## 2022-02-20 LAB — CBC
HCT: 27.5 % — ABNORMAL LOW (ref 39.0–52.0)
Hemoglobin: 10.2 g/dL — ABNORMAL LOW (ref 13.0–17.0)
MCH: 36.6 pg — ABNORMAL HIGH (ref 26.0–34.0)
MCHC: 37.1 g/dL — ABNORMAL HIGH (ref 30.0–36.0)
MCV: 98.6 fL (ref 80.0–100.0)
Platelets: 256 10*3/uL (ref 150–400)
RBC: 2.79 MIL/uL — ABNORMAL LOW (ref 4.22–5.81)
RDW: 14 % (ref 11.5–15.5)
WBC: 9.2 10*3/uL (ref 4.0–10.5)
nRBC: 0 % (ref 0.0–0.2)

## 2022-02-20 LAB — HIV ANTIBODY (ROUTINE TESTING W REFLEX): HIV Screen 4th Generation wRfx: NONREACTIVE

## 2022-02-20 MED ORDER — LIDOCAINE HCL 1 % IJ SOLN
INTRAMUSCULAR | Status: AC
  Filled 2022-02-20: qty 20

## 2022-02-20 MED ORDER — LACTULOSE 10 GM/15ML PO SOLN
10.0000 g | Freq: Three times a day (TID) | ORAL | Status: DC
  Administered 2022-02-20 – 2022-02-22 (×6): 10 g via ORAL
  Filled 2022-02-20 (×7): qty 15

## 2022-02-20 MED ORDER — LEVOTHYROXINE SODIUM 88 MCG PO TABS
88.0000 ug | ORAL_TABLET | Freq: Every day | ORAL | Status: DC
Start: 2022-02-21 — End: 2022-02-22
  Administered 2022-02-21 – 2022-02-22 (×2): 88 ug via ORAL
  Filled 2022-02-20 (×2): qty 1

## 2022-02-20 NOTE — Progress Notes (Signed)
PROGRESS NOTE  Andrew Patton [id number] DOB: Apr 26, 1961 DOA: 02/19/2022 PCP: Deland Pretty, MD   LOS: 0 days   Brief Narrative / Interim history: 61 year old male with DM 2, HTN, decompensated EtOH cirrhosis comes into the hospital with abdominal pain.  He has therapeutic paracentesis every Mondays, so about a week ago.  Following that, wife has noticed that his left side of the abdomen is more tender and she feels like perhaps it is asymmetric.  She also has noticed that he is a little bit more "foggy".  Subjective / 24h Interval events: Patient alert, but somnolent and slow to respond to questions.  He is appropriate to time, place, situation but seems sluggish  Assesement and Plan: Principal Problem:   Abdominal pain Active Problems:   Alcoholic cirrhosis of liver with ascites ([place])   History of seizure   Hypothyroidism   Uncontrolled type 2 diabetes mellitus with hyperglycemia ([place])   Cognitive deficits   Hepatic encephalopathy ([place])  Principal problem Abdominal pain-with concern for secondary bacterial peritonitis.  Repeat paracentesis, would not necessarily need large-volume but just a sample for studies.  He has been placed on ceftriaxone, continue for now  Active problems Hepatic encephalopathy-ammonia is elevated, he has asterixis on exam and is very sluggish seems slightly confused per wife.  Continue rifaximin, scheduled lactulose and continue to closely monitor mental status  Decompensated liver cirrhosis-with ascites, hepatic encephalopathy.  Continue supportive care and treatment of complications  Hypothyroidism-continue Synthroid, was recent TSH was abnormal more than a year ago.  Given mild confusion, repeat a TSH  Hyperlipidemia-continue home medications  Anxiety/depression-continue home medications  Uncontrolled DM2, with hyperglycemia-not on meds at home, monitor CBGs here  Lab Results  Component Value Date   HGBA1C 8.6 (H) 09/04/2020    Scheduled  Meds:  busPIRone  7.5 mg Oral Daily   And   busPIRone  15 mg Oral QPM   clonazePAM  0.5 mg Oral QHS   colesevelam  625 mg Oral Q breakfast   escitalopram  5 mg Oral Daily   ezetimibe  10 mg Oral Daily   lactulose  10 g Oral TID   levETIRAcetam  1,000 mg Oral Daily   levothyroxine  88 mcg Oral Q0600   loratadine  10 mg Oral QPM   pantoprazole  20 mg Oral BID   rifaximin  550 mg Oral BID   thiamine  100 mg Oral Daily   Continuous Infusions:  cefTRIAXone (ROCEPHIN)  IV     PRN Meds:.morphine injection, ondansetron **OR** ondansetron (ZOFRAN) IV  Diet Orders (From admission, onward)     Start     Ordered   02/19/22 2121  Diet 2 gram sodium Room service appropriate? Yes; Fluid consistency: Thin  Diet effective now       Question Answer Comment  Room service appropriate? Yes   Fluid consistency: Thin      02/19/22 2122            DVT prophylaxis: SCDs Start: 02/19/22 2115   Lab Results  Component Value Date   PLT 256 02/20/2022      Code Status: Full Code  Family Communication: wife at bedside   Status is: Inpatient, persistent encephalopathy  Level of care: Med-Surg  Consultants:  none  Objective: Vitals:   02/19/22 2114 02/20/22 0240 02/20/22 0529 02/20/22 0828  BP:  (!) 90/46 109/61 129/71  Pulse:  72 73 76  Resp:  18 18 16   Temp:  97.8 F (36.6 C) 97.6 F (  36.4 C) 98 F (36.7 C)  TempSrc:  Oral Oral Oral  SpO2: 100% 99% 99% 100%    Intake/Output Summary (Last 24 hours) at 02/20/2022 1045 Last data filed at 02/19/2022 1642 Gross per 24 hour  Intake 193.77 ml  Output --  Net 193.77 ml   Wt Readings from Last 3 Encounters:  12/10/21 80.8 kg  11/23/21 84 kg  08/11/21 77.6 kg    Examination:  Constitutional: NAD Eyes: no scleral icterus ENMT: Mucous membranes are moist.  Neck: normal, supple Respiratory: clear to auscultation bilaterally, no wheezing, no crackles.  Cardiovascular: Regular rate and rhythm, no murmurs / rubs / gallops.   Abdomen: non distended, no tenderness. Bowel sounds positive.  Musculoskeletal: no clubbing / cyanosis.  Skin: no rashes Neurologic: non focal   Data Reviewed: I have independently reviewed following labs and imaging studies   CBC Recent Labs  Lab 02/19/22 1116 02/20/22 0619  WBC 12.4* 9.2  HGB 11.9* 10.2*  HCT 32.2* 27.5*  PLT 174 256  MCV 97.3 98.6  MCH 36.0* 36.6*  MCHC 37.0* 37.1*  RDW 14.0 14.0    Recent Labs  Lab 02/19/22 1116 02/19/22 1303 02/19/22 2233 02/20/22 0619  NA 127*  --   --  130*  K 4.5  --   --  4.2  CL 93*  --   --  100  CO2 24  --   --  23  GLUCOSE 106*  --   --  115*  BUN 21*  --   --  19  CREATININE 1.28*  --   --  0.97  CALCIUM 9.9  --   --  8.5*  AST 43*  --   --  38  ALT 28  --   --  27  ALKPHOS 118  --   --  85  BILITOT 4.7*  --   --  3.5*  ALBUMIN 4.2  --   --  3.1*  INR  --   --  1.6*  --   AMMONIA  --  77*  --   --     ------------------------------------------------------------------------------------------------------------------ No results for input(s): "CHOL", "HDL", "LDLCALC", "TRIG", "CHOLHDL", "LDLDIRECT" in the last 72 hours.  Lab Results  Component Value Date   HGBA1C 8.6 (H) 09/04/2020   ------------------------------------------------------------------------------------------------------------------ No results for input(s): "TSH", "T4TOTAL", "T3FREE", "THYROIDAB" in the last 72 hours.  Invalid input(s): "FREET3"  Cardiac Enzymes No results for input(s): "CKMB", "TROPONINI", "MYOGLOBIN" in the last 168 hours.  Invalid input(s): "CK" ------------------------------------------------------------------------------------------------------------------ No results found for: "BNP"  CBG: No results for input(s): "GLUCAP" in the last 168 hours.  Recent Results (from the past 240 hour(s))  Gram stain     Status: None   Collection Time: 02/15/22  3:44 PM   Specimen: Abdomen; Peritoneal Fluid  Result Value Ref Range  Status   Specimen Description FLUID PERITONEAL ABDOMEN  Final   Special Requests NONE  Final   Gram Stain   Final    WBC PRESENT, PREDOMINANTLY MONONUCLEAR NO ORGANISMS SEEN CYTOSPIN SMEAR Performed at [facility], [address], [place], [place] [address]    Report Status 02/15/2022 FINAL  Final  Culture, body fluid w Gram Stain-bottle     Status: None   Collection Time: 02/15/22  3:44 PM   Specimen: Fluid  Result Value Ref Range Status   Specimen Description FLUID PERITONEAL ABDOMEN  Final   Special Requests BOTTLES DRAWN AEROBIC AND ANAEROBIC  Final   Culture   Final  NO GROWTH 5 DAYS Performed at [facility], [address] [place], [place] [address]    Report Status 02/20/2022 FINAL  Final     Radiology Studies: CT ABDOMEN PELVIS W CONTRAST  Result Date: 02/19/2022 CLINICAL DATA:  Postoperative left upper quadrant abdominal pain. EXAM: CT ABDOMEN AND PELVIS WITH CONTRAST TECHNIQUE: Multidetector CT imaging of the abdomen and pelvis was performed using the standard protocol following bolus administration of intravenous contrast. RADIATION DOSE REDUCTION: This exam was performed according to the departmental dose-optimization program which includes automated exposure control, adjustment of the mA and/or kV according to patient size and/or use of iterative reconstruction technique. CONTRAST:  34mL OMNIPAQUE IOHEXOL 300 MG/ML  SOLN COMPARISON:  Dec 29, 2021. FINDINGS: Lower chest: No acute abnormality. Hepatobiliary: No gallstones or biliary dilatation is noted. Hepatic cirrhosis is noted. No definite focal hepatic abnormality is noted. Pancreas: Unremarkable. No pancreatic ductal dilatation or surrounding inflammatory changes. Spleen: Normal in size without focal abnormality. Adrenals/Urinary Tract: Adrenal glands are unremarkable. Kidneys are normal, without renal calculi, focal lesion, or hydronephrosis. Bladder is unremarkable. Stomach/Bowel: The stomach appears  normal. Status post appendectomy. No abnormal bowel dilatation or inflammation is noted. Vascular/Lymphatic: Atherosclerosis of abdominal aorta is noted. Mildly enlarged adenopathy is noted in porta hepatis region most likely secondary to liver disease. Reproductive: Prostate is unremarkable. Other: Moderate size fat containing periumbilical hernia is noted. Mild ascites is noted. Musculoskeletal: No acute or significant osseous findings. IMPRESSION: Hepatic cirrhosis.  Mild ascites is noted. Moderate size fat containing periumbilical hernia is noted. Aortic Atherosclerosis (ICD10-I70.0). Electronically Signed   By: Lupita Raider M.D.   On: 02/19/2022 13:29     Pamella Pert, MD, PhD [facility]  Between 7 am - 7 pm I am available, please contact me via Amion (for emergencies) or Securechat (non urgent messages)  Between 7 pm - 7 am I am not available, please contact night coverage MD/APP via Amion

## 2022-02-21 ENCOUNTER — Inpatient Hospital Stay (HOSPITAL_COMMUNITY): Payer: BC Managed Care – PPO

## 2022-02-21 DIAGNOSIS — R1010 Upper abdominal pain, unspecified: Secondary | ICD-10-CM | POA: Diagnosis not present

## 2022-02-21 LAB — CBC
HCT: 27 % — ABNORMAL LOW (ref 39.0–52.0)
Hemoglobin: 9.9 g/dL — ABNORMAL LOW (ref 13.0–17.0)
MCH: 36.7 pg — ABNORMAL HIGH (ref 26.0–34.0)
MCHC: 36.7 g/dL — ABNORMAL HIGH (ref 30.0–36.0)
MCV: 100 fL (ref 80.0–100.0)
Platelets: 236 10*3/uL (ref 150–400)
RBC: 2.7 MIL/uL — ABNORMAL LOW (ref 4.22–5.81)
RDW: 14.4 % (ref 11.5–15.5)
WBC: 8.6 10*3/uL (ref 4.0–10.5)
nRBC: 0 % (ref 0.0–0.2)

## 2022-02-21 LAB — COMPREHENSIVE METABOLIC PANEL
ALT: 26 U/L (ref 0–44)
AST: 38 U/L (ref 15–41)
Albumin: 3 g/dL — ABNORMAL LOW (ref 3.5–5.0)
Alkaline Phosphatase: 80 U/L (ref 38–126)
Anion gap: 7 (ref 5–15)
BUN: 16 mg/dL (ref 6–20)
CO2: 25 mmol/L (ref 22–32)
Calcium: 8.5 mg/dL — ABNORMAL LOW (ref 8.9–10.3)
Chloride: 99 mmol/L (ref 98–111)
Creatinine, Ser: 0.81 mg/dL (ref 0.61–1.24)
GFR, Estimated: >60 mL/min (ref >60)
Glucose, Bld: 125 mg/dL — ABNORMAL HIGH (ref 70–99)
Potassium: 4.1 mmol/L (ref 3.5–5.1)
Sodium: 131 mmol/L — ABNORMAL LOW (ref 135–145)
Total Bilirubin: 3.7 mg/dL — ABNORMAL HIGH (ref 0.3–1.2)
Total Protein: 5.4 g/dL — ABNORMAL LOW (ref 6.5–8.1)

## 2022-02-21 LAB — PROTIME-INR
INR: 1.7 — ABNORMAL HIGH (ref 0.8–1.2)
Prothrombin Time: 19.6 seconds — ABNORMAL HIGH (ref 11.4–15.2)

## 2022-02-21 LAB — TSH: TSH: 1.162 u[IU]/mL (ref 0.350–4.500)

## 2022-02-21 LAB — MAGNESIUM: Magnesium: 1.8 mg/dL (ref 1.7–2.4)

## 2022-02-21 MED ORDER — MELATONIN 5 MG PO TABS
5.0000 mg | ORAL_TABLET | Freq: Every evening | ORAL | Status: DC | PRN
  Administered 2022-02-21: 5 mg via ORAL
  Filled 2022-02-21: qty 1

## 2022-02-21 MED ORDER — FLUTICASONE PROPIONATE 50 MCG/ACT NA SUSP
1.0000 | Freq: Every day | NASAL | Status: DC
Start: 2022-02-21 — End: 2022-02-22
  Administered 2022-02-21: 1 via NASAL
  Filled 2022-02-21: qty 16

## 2022-02-21 NOTE — Progress Notes (Signed)
PROGRESS NOTE  Andrew Patton NFA:[id number] DOB: April 28, 1961 DOA: 02/19/2022 PCP: [name], MD   LOS: 1 day   Brief Narrative / Interim history: 61 year old male with DM 2, HTN, decompensated EtOH cirrhosis comes into the hospital with abdominal pain.  He has therapeutic paracentesis every Mondays, so about a week ago.  Following that, wife has noticed that his left side of the abdomen is more tender and she feels like perhaps it is asymmetric.  She also has noticed that he is a little bit more "foggy".  Subjective / 24h Interval events: Complained of an episode of severe right upper quadrant pain last night.  Otherwise alert this morning and does not have much pain  Assesement and Plan: Principal Problem:   Abdominal pain Active Problems:   Alcoholic cirrhosis of liver with ascites (HCC)   History of seizure   Hypothyroidism   Uncontrolled type 2 diabetes mellitus with hyperglycemia (HCC)   Cognitive deficits   Hepatic encephalopathy (HCC)  Principal problem Abdominal pain-with concern for secondary bacterial peritonitis.  Repeat paracentesis, would not necessarily need large-volume but just a sample for studies.  Ultrasound yesterday however did not show much ascites to safely remove a sample.  We will repeat ultrasound tomorrow.  Obtain liver Dopplers since portal vein thrombosis could cause this level of pain also  Active problems Hepatic encephalopathy-ammonia was elevated on admission, he had asterixis on exam and was very sluggish seems slightly confused per wife.  He was placed on scheduled lactulose in addition to his home rifaximin.  Mental status much better today, he is more alert and appears close to baseline.  Decompensated liver cirrhosis-with ascites, hepatic encephalopathy.  Continue supportive care and treatment of complications  Hypothyroidism-continue Synthroid, was recent TSH was abnormal more than a year ago.  TSH repeated, now normal at  1.16  Hyperlipidemia-continue home medications  Anxiety/depression-continue home medications  Uncontrolled DM2, with hyperglycemia-not on meds at home, monitor CBGs here  Lab Results  Component Value Date   HGBA1C 8.6 (H) 09/04/2020    Scheduled Meds:  busPIRone  7.5 mg Oral Daily   And   busPIRone  15 mg Oral QPM   clonazePAM  0.5 mg Oral QHS   colesevelam  625 mg Oral Q breakfast   escitalopram  5 mg Oral Daily   ezetimibe  10 mg Oral Daily   lactulose  10 g Oral TID   levETIRAcetam  1,000 mg Oral Daily   levothyroxine  88 mcg Oral Q0600   loratadine  10 mg Oral QPM   pantoprazole  20 mg Oral BID   rifaximin  550 mg Oral BID   thiamine  100 mg Oral Daily   Continuous Infusions:  cefTRIAXone (ROCEPHIN)  IV 2 g (02/20/22 1701)   PRN Meds:.morphine injection, ondansetron **OR** ondansetron (ZOFRAN) IV  Diet Orders (From admission, onward)     Start     Ordered   02/21/22 1035  Diet NPO time specified  Diet effective now       Comments: Per radiology-- patient must be NPO for 6 hours prior to liver US   02/21/22 1034            DVT prophylaxis: SCDs Start: 02/19/22 2115   Lab Results  Component Value Date   PLT 236 02/21/2022      Code Status: Full Code  Family Communication: wife at bedside   Status is: Inpatient, persistent encephalopathy  Level of care: Med-Surg  Consultants:  none  Objective: Vitals:  02/20/22 0828 02/20/22 1351 02/20/22 2106 02/21/22 0411  BP: 129/71 (!) 121/58 (!) 108/52 (!) 108/58  Pulse: 76 72 79 75  Resp: 16 18 16 16   Temp: 98 F (36.7 C) 98 F (36.7 C) 98.3 F (36.8 C) 98.1 F (36.7 C)  TempSrc: Oral Oral Oral Oral  SpO2: 100% 100% 100% 100%    Intake/Output Summary (Last 24 hours) at 02/21/2022 1248 Last data filed at 02/21/2022 0600 Gross per 24 hour  Intake 580 ml  Output 300 ml  Net 280 ml    Wt Readings from Last 3 Encounters:  12/10/21 80.8 kg  11/23/21 84 kg  08/11/21 77.6 kg     Examination:  Constitutional: NAD Eyes: lids and conjunctivae normal, faint scleral icterus ENMT: mmm Neck: normal, supple Respiratory: clear to auscultation bilaterally, no wheezing, no crackles. Normal respiratory effort.  Cardiovascular: Regular rate and rhythm, no murmurs / rubs / gallops. No LE edema. Abdomen: soft, no distention, no tenderness. Bowel sounds positive.  Skin: no rashes Neurologic: no focal deficits, equal strength.  No asterixis   Data Reviewed: I have independently reviewed following labs and imaging studies   CBC Recent Labs  Lab 02/19/22 1116 02/20/22 0619 02/21/22 0556  WBC 12.4* 9.2 8.6  HGB 11.9* 10.2* 9.9*  HCT 32.2* 27.5* 27.0*  PLT 174 256 236  MCV 97.3 98.6 100.0  MCH 36.0* 36.6* 36.7*  MCHC 37.0* 37.1* 36.7*  RDW 14.0 14.0 14.4     Recent Labs  Lab 02/19/22 1116 02/19/22 1303 02/19/22 2233 02/20/22 0619 02/21/22 0556  NA 127*  --   --  130* 131*  K 4.5  --   --  4.2 4.1  CL 93*  --   --  100 99  CO2 24  --   --  23 25  GLUCOSE 106*  --   --  115* 125*  BUN 21*  --   --  19 16  CREATININE 1.28*  --   --  0.97 0.81  CALCIUM 9.9  --   --  8.5* 8.5*  AST 43*  --   --  38 38  ALT 28  --   --  27 26  ALKPHOS 118  --   --  85 80  BILITOT 4.7*  --   --  3.5* 3.7*  ALBUMIN 4.2  --   --  3.1* 3.0*  MG  --   --   --   --  1.8  INR  --   --  1.6*  --  1.7*  TSH  --   --   --   --  1.162  AMMONIA  --  77*  --   --   --      ------------------------------------------------------------------------------------------------------------------ No results for input(s): "CHOL", "HDL", "LDLCALC", "TRIG", "CHOLHDL", "LDLDIRECT" in the last 72 hours.  Lab Results  Component Value Date   HGBA1C 8.6 (H) 09/04/2020   ------------------------------------------------------------------------------------------------------------------ Recent Labs    02/21/22 0556  TSH 1.162    Cardiac Enzymes No results for input(s): "CKMB", "TROPONINI",  "MYOGLOBIN" in the last 168 hours.  Invalid input(s): "CK" ------------------------------------------------------------------------------------------------------------------ No results found for: "BNP"  CBG: No results for input(s): "GLUCAP" in the last 168 hours.  Recent Results (from the past 240 hour(s))  Gram stain     Status: None   Collection Time: 02/15/22  3:44 PM   Specimen: Abdomen; Peritoneal Fluid  Result Value Ref Range Status   Specimen Description FLUID PERITONEAL ABDOMEN  Final  Special Requests NONE  Final   Gram Stain   Final    WBC PRESENT, PREDOMINANTLY MONONUCLEAR NO ORGANISMS SEEN CYTOSPIN SMEAR Performed at [facility], [address], [place], [place] [address]    Report Status 02/15/2022 FINAL  Final  Culture, body fluid w Gram Stain-bottle     Status: None   Collection Time: 02/15/22  3:44 PM   Specimen: Fluid  Result Value Ref Range Status   Specimen Description FLUID PERITONEAL ABDOMEN  Final   Special Requests BOTTLES DRAWN AEROBIC AND ANAEROBIC  Final   Culture   Final    NO GROWTH 5 DAYS Performed at [facility], [address], [place], [place] [address]    Report Status 02/20/2022 FINAL  Final     Radiology Studies: [name] ASCITES (ABDOMEN LIMITED)  Result Date: 02/20/2022 CLINICAL DATA:  Assess for ascites. EXAM: LIMITED ABDOMEN ULTRASOUND FOR ASCITES TECHNIQUE: Limited ultrasound survey for ascites was performed in all four abdominal quadrants. COMPARISON:  CT, 02/19/2022. FINDINGS: Small amount of ascites lies between loops of bowel in all 4 quadrants. There is no significant collection of ascites to allow for safe paracentesis. IMPRESSION: Small amount of ascites. Electronically Signed   By: Amie Portland M.D.   On: 02/20/2022 14:57     Pamella Pert, MD, PhD [facility]  Between 7 am - 7 pm I am available, please contact me via Amion (for emergencies) or Securechat (non urgent messages)  Between 7 pm - 7 am I am  not available, please contact night coverage MD/APP via Amion

## 2022-02-22 ENCOUNTER — Inpatient Hospital Stay (HOSPITAL_COMMUNITY): Admission: RE | Admit: 2022-02-22 | Payer: BC Managed Care – PPO | Source: Ambulatory Visit

## 2022-02-22 ENCOUNTER — Other Ambulatory Visit: Payer: Self-pay | Admitting: Interventional Radiology

## 2022-02-22 ENCOUNTER — Inpatient Hospital Stay (HOSPITAL_COMMUNITY): Payer: BC Managed Care – PPO

## 2022-02-22 DIAGNOSIS — R1010 Upper abdominal pain, unspecified: Secondary | ICD-10-CM | POA: Diagnosis not present

## 2022-02-22 DIAGNOSIS — K7031 Alcoholic cirrhosis of liver with ascites: Secondary | ICD-10-CM

## 2022-02-22 MED ORDER — TRAMADOL HCL 50 MG PO TABS: 50.0000 mg | ORAL_TABLET | Freq: Every day | ORAL | 0 refills | Status: DC

## 2022-02-22 MED ORDER — CIPROFLOXACIN HCL 500 MG PO TABS: 500.0000 mg | ORAL_TABLET | Freq: Two times a day (BID) | ORAL | 0 refills | Status: AC

## 2022-02-22 MED ORDER — LIDOCAINE HCL 1 % IJ SOLN
INTRAMUSCULAR | Status: DC
Start: 2022-02-22 — End: 2022-02-22
  Filled 2022-02-22: qty 20

## 2022-02-22 NOTE — Progress Notes (Signed)
Patient ID: Andrew Patton, male   DOB: September 11, 1960, 61 y.o.   MRN: [id number] Pt presented to [name] dept today for paracentesis. On limited US abd in all four quadrants there is minimal ascites noted with bowel loops in close proximity. Majority of fluid is high above liver. Recommend holding on tap today and reassessing pt in a few days for safer window.

## 2022-02-22 NOTE — Discharge Summary (Signed)
Physician Discharge Summary  Andrew Patton [id number] DOB: 03-Dec-1960 DOA: 02/19/2022  PCP: Deland Pretty, MD  Admit date: 02/19/2022 Discharge date: 02/22/2022  Admitted From: home Disposition:  home  Recommendations for Outpatient Follow-up:  Follow up with [facility] in 3 days as scheduled in 1-2 weeks Follow up with [facility] next week as scheduled Please obtain BMP/CBC in one week  Home Health: none Equipment/Devices: none  Discharge Condition: stable CODE STATUS: Full code Diet Orders (From admission, onward)     Start     Ordered   02/21/22 1733  Diet 2 gram sodium Room service appropriate? Yes; Fluid consistency: Thin  Diet effective now       Question Answer Comment  Room service appropriate? Yes   Fluid consistency: Thin      02/21/22 1732            HPI: Per admitting MD, Andrew Patton is a 61 y.o. male with medical history significant of HTN, DM2, decompensated EtOH cirrhosis. Pt had theraputic paracentesis on Monday, large volume of fluid removed. 8L. Since that time having abd pain especially around site of fluid removal. Lab work on fluid removed on Monday is unimpressive. No fevers, chills. WBC 12k today in ED.  Hospital Course / Discharge diagnoses: Principal Problem:   Abdominal pain Active Problems:   Alcoholic cirrhosis of liver with ascites (HCC)   History of seizure   Hypothyroidism   Uncontrolled type 2 diabetes mellitus with hyperglycemia (HCC)   Cognitive deficits   Hepatic encephalopathy (HCC)   Principal problem Abdominal pain due to presumed SBP-with concern for bacterial peritonitis given elevated WBC as well as worsening hepatic encephalopathy.  He was placed on ceftriaxone.  He had 2 attempts 3 days apart to obtain a fluid sample to the cell count/cultures, however there was not enough ascitic fluid.  With antibiotics he has improved, leukocytosis resolved, he is afebrile and otherwise feeling close to baseline.  We will  empirically treat with a week of antibiotics with 5 days remaining at the time of discharge.  A CT scan of the abdomen and pelvis on admission was fairly unremarkable.  Liver Doppler was unremarkable as well.  Active problems Hepatic encephalopathy-ammonia was elevated on admission, he had asterixis on exam and was very sluggish seems slightly confused per wife.  His home rifaximin was continued but he was also placed on increased lactulose dose.  With this and antibiotics his hepatic encephalopathy has resolved Decompensated liver cirrhosis-with ascites, hepatic encephalopathy.  Continue supportive care and treatment of complications.  He has GI follow-up next week in town but has been also referred to [facility] and has an appointment in 3 days  Hypothyroidism-continue Synthroid, was recent TSH was abnormal more than a year ago.  TSH repeated, now normal at 1.16 Hyperlipidemia-continue home medications Anxiety/depression-continue home medications Uncontrolled DM2, with hyperglycemia-not on meds at home, monitor periodically as an outpatient  Sepsis ruled out   Discharge Instructions   Allergies as of 02/22/2022       Reactions   Crestor [rosuvastatin Calcium] Other (See Comments)   Elevated liver enzymes   Meloxicam Other (See Comments)   Leg pain   Rosuvastatin Other (See Comments)   Elevated liver enzymes   Penicillins Rash   Has patient had a PCN reaction causing immediate rash, facial/tongue/throat swelling, SOB or lightheadedness with hypotension: No Has patient had a PCN reaction causing severe rash involving mucus membranes or skin necrosis: No Has patient had a PCN reaction  that required hospitalization: No Has patient had a PCN reaction occurring within the last 10 years: No If all of the above answers are "NO", then may proceed with Cephalosporin use. Other reaction(s): rash        Medication List     TAKE these medications    busPIRone 15 MG tablet Commonly known as:  BUSPAR Take 7.5-15 mg by mouth 2 (two) times daily. Take 1/2 tablet (7.5 mg) in the morning and Take 1 tablet (15 mg) at supper   ciprofloxacin 500 MG tablet Commonly known as: Cipro Take 1 tablet (500 mg total) by mouth 2 (two) times daily for 5 days.   clonazePAM 0.5 MG tablet Commonly known as: KLONOPIN Take 0.5 mg by mouth at bedtime.   colesevelam 625 MG tablet Commonly known as: WELCHOL Take 625 mg by mouth daily with breakfast.   escitalopram 5 MG tablet Commonly known as: LEXAPRO Take 5 mg by mouth daily.   eszopiclone 2 MG Tabs tablet Commonly known as: LUNESTA Take 2 mg by mouth at bedtime. Take immediately before bedtime   ezetimibe 10 MG tablet Commonly known as: ZETIA Take 10 mg by mouth daily.   fluticasone 50 MCG/ACT nasal spray Commonly known as: FLONASE Place 1 spray into both nostrils at bedtime.   folic acid 1 MG tablet Commonly known as: FOLVITE Take 1 mg by mouth at bedtime.   furosemide 80 MG tablet Commonly known as: Lasix Take 1 tablet (80 mg total) by mouth daily.   ipratropium 0.06 % nasal spray Commonly known as: ATROVENT Place 1 spray into the nose daily.   lactulose 10 GM/15ML solution Commonly known as: CHRONULAC Take 15 mLs (10 g total) by mouth 2 (two) times daily as needed for mild constipation (for bowel movments 2-3 per day).   levETIRAcetam 500 MG 24 hr tablet Commonly known as: Keppra XR Take 2 tablets (1,000 mg total) by mouth daily.   levothyroxine 88 MCG tablet Commonly known as: SYNTHROID Take 88 mcg by mouth every morning.   ondansetron 4 MG disintegrating tablet Commonly known as: Zofran ODT Take 1 tablet (4 mg total) by mouth every 8 (eight) hours as needed for nausea or vomiting. What changed: reasons to take this   pantoprazole 20 MG tablet Commonly known as: Protonix Take 1 tablet (20 mg total) by mouth 2 (two) times daily. Then take 1 tablet by mouth daily What changed: when to take this   rifaximin 550  MG Tabs tablet Commonly known as: XIFAXAN Take 1 tablet (550 mg total) by mouth 2 (two) times daily.   spironolactone 50 MG tablet Commonly known as: Aldactone Take 1 tablet (50 mg total) by mouth daily. Take 1 tablet with 100 mg tablet (150 mg total) by mouth daily. What changed: Another medication with the same name was changed. Make sure you understand how and when to take each.   spironolactone 100 MG tablet Commonly known as: ALDACTONE Take 1 tablet (100 mg total) by mouth daily. Take 1 tablet with 50 mg tablet (150 mg total) by mouth daily. What changed:  how much to take additional instructions   thiamine 100 MG tablet Take 1 tablet (100 mg total) by mouth daily.   traMADol 50 MG tablet Commonly known as: ULTRAM Take 1 tablet (50 mg total) by mouth at bedtime.       Consultations: none  Procedures/Studies:  US LIVER DOPPLER  Result Date: 02/22/2022 CLINICAL DATA:  Abdominal pain, possible bacterial peritonitis EXAM: DUPLEX ULTRASOUND OF  LIVER TECHNIQUE: Color and duplex Doppler ultrasound was performed to evaluate the hepatic in-flow and out-flow vessels. COMPARISON:  CT 02/19/2022 and previous FINDINGS: Liver: Mildly nodular contour suggesting cirrhosis. No focal lesion identified. No evident intrahepatic biliary ductal dilatation. Main Portal Vein size: 0.8 cm Portal Vein Velocities (all hepatopetal): Main Prox:  30 cm/sec Main Mid: 27 cm/sec Main Dist:  21 cm/sec Right: 31 cm/sec Left: 20 cm/sec Hepatic Vein Velocities (all hepatofugal) Right:  14 cm/sec Middle:  32 cm/sec Left:  13 cm/sec IVC: Present and patent with normal respiratory phasicity. Velocity: 22 cm/sec Hepatic Artery Velocity:  27 cm/sec Splenic Vein Velocity:  21 cm/sec Spleen: 8.1 cm x 5.5 cm x 13.1 cm with a total volume of 303 cm^3 (411 cm^3 is upper limit normal) Portal Vein Occlusion/Thrombus: No Splenic Vein Occlusion/Thrombus: No Ascites: Moderate Varices: None identified IMPRESSION: 1. Unremarkable  hepatic vascular Doppler evaluation. 2. Moderate ascites 3. Nodular hepatic contour suggesting cirrhosis Electronically Signed   By: Lucrezia Europe M.D.   On: 02/22/2022 11:00   [name] ASCITES (ABDOMEN LIMITED)  Result Date: 02/20/2022 CLINICAL DATA:  Assess for ascites. EXAM: LIMITED ABDOMEN ULTRASOUND FOR ASCITES TECHNIQUE: Limited ultrasound survey for ascites was performed in all four abdominal quadrants. COMPARISON:  CT, 02/19/2022. FINDINGS: Small amount of ascites lies between loops of bowel in all 4 quadrants. There is no significant collection of ascites to allow for safe paracentesis. IMPRESSION: Small amount of ascites. Electronically Signed   By: Lajean Manes M.D.   On: 02/20/2022 14:57   CT ABDOMEN PELVIS W CONTRAST  Result Date: 02/19/2022 CLINICAL DATA:  Postoperative left upper quadrant abdominal pain. EXAM: CT ABDOMEN AND PELVIS WITH CONTRAST TECHNIQUE: Multidetector CT imaging of the abdomen and pelvis was performed using the standard protocol following bolus administration of intravenous contrast. RADIATION DOSE REDUCTION: This exam was performed according to the departmental dose-optimization program which includes automated exposure control, adjustment of the mA and/or kV according to patient size and/or use of iterative reconstruction technique. CONTRAST:  42mL OMNIPAQUE IOHEXOL 300 MG/ML  SOLN COMPARISON:  Dec 29, 2021. FINDINGS: Lower chest: No acute abnormality. Hepatobiliary: No gallstones or biliary dilatation is noted. Hepatic cirrhosis is noted. No definite focal hepatic abnormality is noted. Pancreas: Unremarkable. No pancreatic ductal dilatation or surrounding inflammatory changes. Spleen: Normal in size without focal abnormality. Adrenals/Urinary Tract: Adrenal glands are unremarkable. Kidneys are normal, without renal calculi, focal lesion, or hydronephrosis. Bladder is unremarkable. Stomach/Bowel: The stomach appears normal. Status post appendectomy. No abnormal bowel dilatation or  inflammation is noted. Vascular/Lymphatic: Atherosclerosis of abdominal aorta is noted. Mildly enlarged adenopathy is noted in porta hepatis region most likely secondary to liver disease. Reproductive: Prostate is unremarkable. Other: Moderate size fat containing periumbilical hernia is noted. Mild ascites is noted. Musculoskeletal: No acute or significant osseous findings. IMPRESSION: Hepatic cirrhosis.  Mild ascites is noted. Moderate size fat containing periumbilical hernia is noted. Aortic Atherosclerosis (ICD10-I70.0). Electronically Signed   By: Marijo Conception M.D.   On: 02/19/2022 13:29   IR Paracentesis  Result Date: 02/16/2022 INDICATION: Alcoholic cirrhosis with recurrent ascites, followed by [facility] for transplant and TIPS options EXAM: ULTRASOUND GUIDED LLQ PARACENTESIS MEDICATIONS: None. COMPLICATIONS: None immediate. PROCEDURE: Informed written consent was obtained from the patient after a discussion of the risks, benefits and alternatives to treatment. A timeout was performed prior to the initiation of the procedure. Initial ultrasound scanning demonstrates a large amount of ascites within the right lower abdominal quadrant. The right lower abdomen was  prepped and draped in the usual sterile fashion. 1% lidocaine was used for local anesthesia. Following this, a 19 gauge, 7-cm, Yueh catheter was introduced. An ultrasound image was saved for documentation purposes. The paracentesis was performed. The catheter was removed and a dressing was applied. The patient tolerated the procedure well without immediate post procedural complication. Patient received post-procedure intravenous albumin; see nursing notes for details. FINDINGS: A total of approximately 8.0L of pale yellow ascitic fluid was removed. IMPRESSION: Successful ultrasound-guided paracentesis yielding 8.0 liters of peritoneal fluid. PLAN: Patient is followed by [facility] and will be discussing possibility of  transplant and TIPS in coming appointments. Electronically Signed   By: Olive Bass M.D.   On: 02/16/2022 08:14   IR Paracentesis  Result Date: 02/04/2022 INDICATION: Recurrent large volume ascites secondary to NASH and alcoholic cirrhosis. Request for therapeutic paracentesis. EXAM: ULTRASOUND GUIDED PARACENTESIS MEDICATIONS: 1% lidocaine 10 mL COMPLICATIONS: None immediate. PROCEDURE: Informed written consent was obtained from the patient after a discussion of the risks, benefits and alternatives to treatment. A timeout was performed prior to the initiation of the procedure. Initial ultrasound scanning demonstrates a large amount of ascites within the right lower abdominal quadrant. The right lower abdomen was prepped and draped in the usual sterile fashion. 1% lidocaine was used for local anesthesia. Following this, a 19 gauge, 7-cm, Yueh catheter was introduced. An ultrasound image was saved for documentation purposes. The paracentesis was performed. The catheter was removed and a dressing was applied. The patient tolerated the procedure well without immediate post procedural complication. Patient received post-procedure intravenous albumin; see nursing notes for details. FINDINGS: A total of approximately 8 L of clear yellow fluid was removed. Samples were sent to the laboratory as requested by the clinical team. IMPRESSION: Successful ultrasound-guided paracentesis yielding 8 liters of peritoneal fluid. PLAN: The patient has previously been formally evaluated by the [facility] and is being actively followed for potential future intervention. Procedure performed by: Corrin Parker, PA-C Electronically Signed   By: Acquanetta Belling M.D.   On: 02/04/2022 16:13     Subjective: - no chest pain, shortness of breath, no abdominal pain, nausea or vomiting.   Discharge Exam: BP 118/60   Pulse 72   Temp 98.4 F (36.9 C)   Resp 16   SpO2 100%   General: Pt  is alert, awake, not in acute distress Cardiovascular: RRR, S1/S2 +, no rubs, no gallops Respiratory: CTA bilaterally, no wheezing, no rhonchi Abdominal: Soft, NT, ND, bowel sounds + Extremities: no edema, no cyanosis   The results of significant diagnostics from this hospitalization (including imaging, microbiology, ancillary and laboratory) are listed below for reference.     Microbiology: Recent Results (from the past 240 hour(s))  Gram stain     Status: None   Collection Time: 02/15/22  3:44 PM   Specimen: Abdomen; Peritoneal Fluid  Result Value Ref Range Status   Specimen Description FLUID PERITONEAL ABDOMEN  Final   Special Requests NONE  Final   Gram Stain   Final    WBC PRESENT, PREDOMINANTLY MONONUCLEAR NO ORGANISMS SEEN CYTOSPIN SMEAR Performed at [facility], [address], [place], [place] [address]    Report Status 02/15/2022 FINAL  Final  Culture, body fluid w Gram Stain-bottle     Status: None   Collection Time: 02/15/22  3:44 PM   Specimen: Fluid  Result Value Ref Range Status   Specimen Description FLUID PERITONEAL ABDOMEN  Final  Special Requests BOTTLES DRAWN AEROBIC AND ANAEROBIC  Final   Culture   Final    NO GROWTH 5 DAYS Performed at [facility], [place] [address], [place], [place] [address]    Report Status 02/20/2022 FINAL  Final     Labs: Basic Metabolic Panel: Recent Labs  Lab 02/19/22 1116 02/20/22 0619 02/21/22 0556  NA 127* 130* 131*  K 4.5 4.2 4.1  CL 93* 100 99  CO2 24 23 25   GLUCOSE 106* 115* 125*  BUN 21* 19 16  CREATININE 1.28* 0.97 0.81  CALCIUM 9.9 8.5* 8.5*  MG  --   --  1.8   Liver Function Tests: Recent Labs  Lab 02/19/22 1116 02/20/22 0619 02/21/22 0556  AST 43* 38 38  ALT 28 27 26   ALKPHOS 118 85 80  BILITOT 4.7* 3.5* 3.7*  PROT 6.7 5.5* 5.4*  ALBUMIN 4.2 3.1* 3.0*   CBC: Recent Labs  Lab 02/19/22 1116 02/20/22 0619 02/21/22 0556  WBC 12.4* 9.2 8.6  HGB 11.9* 10.2* 9.9*  HCT 32.2* 27.5*  27.0*  MCV 97.3 98.6 100.0  PLT 174 256 236   CBG: No results for input(s): "GLUCAP" in the last 168 hours. Hgb A1c No results for input(s): "HGBA1C" in the last 72 hours. Lipid Profile No results for input(s): "CHOL", "HDL", "LDLCALC", "TRIG", "CHOLHDL", "LDLDIRECT" in the last 72 hours. Thyroid function studies Recent Labs    02/21/22 0556  TSH 1.162   Urinalysis    Component Value Date/Time   COLORURINE YELLOW 02/19/2022 1303   APPEARANCEUR CLEAR 02/19/2022 1303   APPEARANCEUR Clear 11/13/2020 1333   LABSPEC 1.029 02/19/2022 1303   PHURINE 5.5 02/19/2022 1303   GLUCOSEU NEGATIVE 02/19/2022 1303   HGBUR NEGATIVE 02/19/2022 1303   BILIRUBINUR NEGATIVE 02/19/2022 1303   BILIRUBINUR Negative 11/13/2020 1333   KETONESUR NEGATIVE 02/19/2022 1303   PROTEINUR NEGATIVE 02/19/2022 1303   NITRITE NEGATIVE 02/19/2022 1303   LEUKOCYTESUR NEGATIVE 02/19/2022 1303    FURTHER DISCHARGE INSTRUCTIONS:   Get Medicines reviewed and adjusted: Please take all your medications with you for your next visit with your Primary MD   Laboratory/radiological data: Please request your Primary MD to go over all hospital tests and procedure/radiological results at the follow up, please ask your Primary MD to get all Hospital records sent to his/her office.   In some cases, they will be blood work, cultures and biopsy results pending at the time of your discharge. Please request that your primary care M.D. goes through all the records of your hospital data and follows up on these results.   Also Note the following: If you experience worsening of your admission symptoms, develop shortness of breath, life threatening emergency, suicidal or homicidal thoughts you must seek medical attention immediately by calling 911 or calling your MD immediately  if symptoms less severe.   You must read complete instructions/literature along with all the possible adverse reactions/side effects for all the Medicines  you take and that have been prescribed to you. Take any new Medicines after you have completely understood and accpet all the possible adverse reactions/side effects.    Do not drive when taking Pain medications or sleeping medications (Benzodaizepines)   Do not take more than prescribed Pain, Sleep and Anxiety Medications. It is not advisable to combine anxiety,sleep and pain medications without talking with your primary care practitioner   Special Instructions: If you have smoked or chewed Tobacco  in the last 2 yrs please stop smoking, stop any  regular Alcohol  and or any Recreational drug use.   Wear Seat belts while driving.   Please note: You were cared for by a hospitalist during your hospital stay. Once you are discharged, your primary care physician will handle any further medical issues. Please note that NO REFILLS for any discharge medications will be authorized once you are discharged, as it is imperative that you return to your primary care physician (or establish a relationship with a primary care physician if you do not have one) for your post hospital discharge needs so that they can reassess your need for medications and monitor your lab values.  Time coordinating discharge: 35 minutes  SIGNED:  Marzetta Board, MD, PhD 02/22/2022, 1:06 PM

## 2022-02-22 NOTE — Progress Notes (Signed)
Patient will be discharging home with wife. Belongings were returned. Education on medications will be provided.

## 2022-02-22 NOTE — TOC Transition Note (Signed)
Transition of Care [facility]) - CM/SW Discharge Note   Patient Details  Name: Andrew Patton MRN: [id number] Date of Birth: 05/26/1961  [facility] CM/SW Contact:  Otelia Santee, LCSW Phone Number: 02/22/2022, 1:17 PM   Clinical Narrative:     Transition of Care Department [facility]) has reviewed patient and no TOC needs have been identified at this time. We will continue to monitor patient advancement through interdisciplinary progression rounds. If new patient transition needs arise, please place a TOC consult.           Patient Goals and CMS Choice        Discharge Placement                       Discharge Plan and Services                                     Social Determinants of Health (SDOH) Interventions     Readmission Risk Interventions    02/22/2022    1:17 PM  Readmission Risk Prevention Plan  Transportation Screening Complete  PCP or Specialist Appt within 5-7 Days Complete  Home Care Screening Complete  Medication Review (RN CM) Complete

## 2022-02-25 DIAGNOSIS — R5383 Other fatigue: Secondary | ICD-10-CM | POA: Diagnosis not present

## 2022-02-25 DIAGNOSIS — Z87891 Personal history of nicotine dependence: Secondary | ICD-10-CM | POA: Diagnosis not present

## 2022-02-25 DIAGNOSIS — K7290 Hepatic failure, unspecified without coma: Secondary | ICD-10-CM | POA: Diagnosis not present

## 2022-02-25 DIAGNOSIS — I10 Essential (primary) hypertension: Secondary | ICD-10-CM | POA: Diagnosis not present

## 2022-02-25 DIAGNOSIS — R188 Other ascites: Secondary | ICD-10-CM | POA: Diagnosis not present

## 2022-02-25 DIAGNOSIS — E785 Hyperlipidemia, unspecified: Secondary | ICD-10-CM | POA: Diagnosis not present

## 2022-02-25 DIAGNOSIS — K7460 Unspecified cirrhosis of liver: Secondary | ICD-10-CM | POA: Diagnosis not present

## 2022-02-25 DIAGNOSIS — K7682 Hepatic encephalopathy: Secondary | ICD-10-CM | POA: Diagnosis not present

## 2022-02-25 DIAGNOSIS — Z79899 Other long term (current) drug therapy: Secondary | ICD-10-CM | POA: Diagnosis not present

## 2022-02-26 NOTE — Telephone Encounter (Signed)
Dr Barron Alvine is out of the office until Monday-I will send him this message and get back to you as soon as possible.    Dr Barron Alvine please see pt request he has been advised that you are out of the office until Monday.

## 2022-03-01 ENCOUNTER — Other Ambulatory Visit: Payer: Self-pay

## 2022-03-01 DIAGNOSIS — K7031 Alcoholic cirrhosis of liver with ascites: Secondary | ICD-10-CM

## 2022-03-01 DIAGNOSIS — R197 Diarrhea, unspecified: Secondary | ICD-10-CM

## 2022-03-01 NOTE — Telephone Encounter (Signed)
Patient wife called to follow up on message below regarding paracenteses.

## 2022-03-01 NOTE — Telephone Encounter (Signed)
Patient's wife Is calling back requested a call from a nurse to discuss paracentesis.

## 2022-03-01 NOTE — Telephone Encounter (Signed)
Dr Barron Alvine the pt states that he has gotten the ok to have paracentesis.  I will enter the order.  Please see the message from the pt regarding diarrhea.

## 2022-03-01 NOTE — Telephone Encounter (Signed)
Given recent hospitalization please check the following: - GI PCR panel - C. difficile - Fecal calprotectin - If negative for infectious etiology, can use Lomotil

## 2022-03-01 NOTE — Telephone Encounter (Signed)
See MyChart message dated 7/31 from Dr Barron Alvine.

## 2022-03-01 NOTE — Telephone Encounter (Signed)
See Dr Barron Alvine note in My Chart regarding the paracentesis.

## 2022-03-01 NOTE — Telephone Encounter (Signed)
I see that Andrew Patton was seen in the [facility] on 02/25/2022.  Spironolactone was increased to 150 mg/day and Lasix to remain at 80 mg/day.  Appears he has plan for follow-up in 3-4 weeks.  I also reviewed his recent hospitalization, to include multiple imaging studies.  Had attempt at diagnostic paracentesis, but small volume ascites not amenable to paracentesis at that time.  I understand that he is again accumulating fluid, and could again require therapeutic paracentesis.  I can order for that to be done here, but I request that the patient also contact [facility] to make sure they are ok with proceeding with therapeutic paracentesis since he is in the process of transitioning his hepatology care to their facility.  If approved by [facility], plan for paracentesis this week.  Remove no more than 8 L.  Give albumin 25% 6 g for every liter removed.  Send fluid for cell count, Gram stain, culture.

## 2022-03-02 ENCOUNTER — Other Ambulatory Visit: Payer: BC Managed Care – PPO

## 2022-03-03 ENCOUNTER — Ambulatory Visit (HOSPITAL_COMMUNITY)
Admission: RE | Admit: 2022-03-03 | Discharge: 2022-03-03 | Disposition: A | Discharge status: Home / Self Care | Payer: BC Managed Care – PPO | Source: Ambulatory Visit | Attending: Gastroenterology | Admitting: Gastroenterology

## 2022-03-03 DIAGNOSIS — K7031 Alcoholic cirrhosis of liver with ascites: Secondary | ICD-10-CM | POA: Insufficient documentation

## 2022-03-03 HISTORY — PX: IR PARACENTESIS: IMG2679

## 2022-03-03 LAB — BODY FLUID CELL COUNT WITH DIFFERENTIAL
Eos, Fluid: 0 %
Lymphs, Fluid: 69 %
Monocyte-Macrophage-Serous Fluid: 25 % — ABNORMAL LOW (ref 50–90)
Neutrophil Count, Fluid: 6 % (ref 0–25)
Total Nucleated Cell Count, Fluid: 104 cu mm (ref 0–1000)

## 2022-03-03 LAB — GRAM STAIN: Gram Stain: NONE SEEN

## 2022-03-03 MED ORDER — LIDOCAINE HCL (PF) 1 % IJ SOLN
INTRAMUSCULAR | Status: DC | PRN
  Administered 2022-03-03: 10 mL

## 2022-03-03 MED ORDER — LIDOCAINE HCL 1 % IJ SOLN
INTRAMUSCULAR | Status: AC
  Filled 2022-03-03: qty 20

## 2022-03-03 MED ORDER — ALBUMIN HUMAN 25 % IV SOLN
50.0000 g | Freq: Once | INTRAVENOUS | Status: AC
  Filled 2022-03-03: qty 200

## 2022-03-03 MED ORDER — ALBUMIN HUMAN 25 % IV SOLN
INTRAVENOUS | Status: AC
  Administered 2022-03-03: 50 g via INTRAVENOUS
  Filled 2022-03-03: qty 200

## 2022-03-03 NOTE — Procedures (Signed)
PROCEDURE SUMMARY:  Successful US guided paracentesis from RLQ.  Yielded 8L of ascitic fluid.  No immediate complications.  Pt tolerated well.   Specimen was sent for labs.  EBL < 19mL  Rexann Lueras PA-C 03/03/2022 3:09 PM

## 2022-03-04 LAB — PATHOLOGIST SMEAR REVIEW

## 2022-03-05 ENCOUNTER — Encounter: Payer: Self-pay | Admitting: Gastroenterology

## 2022-03-05 ENCOUNTER — Other Ambulatory Visit (INDEPENDENT_AMBULATORY_CARE_PROVIDER_SITE_OTHER): Payer: BC Managed Care – PPO

## 2022-03-05 ENCOUNTER — Ambulatory Visit: Payer: BC Managed Care – PPO | Admitting: Gastroenterology

## 2022-03-05 VITALS — BP 112/62 | HR 72 | Ht 66.0 in | Wt 159.0 lb

## 2022-03-05 DIAGNOSIS — K7460 Unspecified cirrhosis of liver: Secondary | ICD-10-CM

## 2022-03-05 DIAGNOSIS — K766 Portal hypertension: Secondary | ICD-10-CM | POA: Diagnosis not present

## 2022-03-05 DIAGNOSIS — K429 Umbilical hernia without obstruction or gangrene: Secondary | ICD-10-CM

## 2022-03-05 DIAGNOSIS — K3189 Other diseases of stomach and duodenum: Secondary | ICD-10-CM

## 2022-03-05 DIAGNOSIS — R188 Other ascites: Secondary | ICD-10-CM | POA: Diagnosis not present

## 2022-03-05 DIAGNOSIS — K7682 Hepatic encephalopathy: Secondary | ICD-10-CM | POA: Diagnosis not present

## 2022-03-05 LAB — COMPREHENSIVE METABOLIC PANEL
ALT: 23 U/L (ref 0–53)
AST: 37 U/L (ref 0–37)
Albumin: 3.9 g/dL (ref 3.5–5.2)
Alkaline Phosphatase: 94 U/L (ref 39–117)
BUN: 20 mg/dL (ref 6–23)
CO2: 29 mEq/L (ref 19–32)
Calcium: 9.1 mg/dL (ref 8.4–10.5)
Chloride: 93 mEq/L — ABNORMAL LOW (ref 96–112)
Creatinine, Ser: 1.25 mg/dL (ref 0.40–1.50)
GFR: 62.51 mL/min (ref >60.00)
Glucose, Bld: 162 mg/dL — ABNORMAL HIGH (ref 70–99)
Potassium: 4.6 mEq/L (ref 3.5–5.1)
Sodium: 128 mEq/L — ABNORMAL LOW (ref 135–145)
Total Bilirubin: 3.4 mg/dL — ABNORMAL HIGH (ref 0.2–1.2)
Total Protein: 6.5 g/dL (ref 6.0–8.3)

## 2022-03-05 NOTE — Progress Notes (Signed)
Chief Complaint:    Cirrhosis, hospital follow-up  GI History: 61 year old male with a history of psoriatic arthritis (previously treated with Enbrel, now Cosentyx), hypertension, hyperlipidemia, and history of alcohol abuse,  diagnosed with cirrhosis in 03/2019 on hospital admission (see prior notes for summary of hospitalization work-up).    Cirrhosis Evaluation: - Etiology: Steatohepatitis; 2/2 EtOH, but also RFs for NASH to include hypertension, hyperlipidemia, central adiposity/history of obesity   - Complications: Ascites, portal hypertensive gastropathy, hepatic encephalopathy - HCC screening: RUQ [name] and CT A/P during hospital admission 01/2022: Cirrhosis, no HCC.  AFP normal 08/2020 - Variceal screening: EGD 08/2021 (no varices) - Serologic evaluation: Completed 03/2019-all negative - Viral hepatitis vaccination: UTD; HAVAb+, HBsAb+ - Flu vaccine: Declines - Liver biopsy: None to date - Medications: Lasix 80 mg, Aldactone 150 mg, lactulose, Rifaxmin - MELD: 13 - Child Pugh score: C (11 points)     Endoscopic history: - Colonoscopy (01/2013, Dr. Jarold Motto): Normal.  Repeat in 10 years -EGD (04/2019, Dr. Barron Alvine): Normal esophagus, moderate PHG, normal duodenum - EGD (08/2021, Dr. Barron Alvine): LA Grade A esophagitis, no esophageal varices, moderate portal hypertensive gastropathy, mild peptic duodenitis.  Unchanged omeprazole to Protonix  HPI:     Patient is a 61 y.o. male presenting to the [facility]astro[facility]ology Clinic for follow-up.   Hospital admission 7/21-24 with abdominal pain and hepatic encephalopathy following therapeutic paracentesis.  Was treated with ceftriaxone for possible SBP.  Unsuccessful attempt at diagnostic paracentesis (not enough fluid).  Leukocytosis resolved, continue with empiric ABX x5 days.  CT unremarkable for acute intra-abdominal pathology.  Encephalopathy and asterixis resolved after restarting home rifaximin and increased lactulose.    Was seen at the [facility]  Transplant Hepatology clinic on 7/27.  Completing serologic work-up to  r/o additional concomitant liver disease, along with abdominal ultrasound.  Increased spironolactone to 150 mg/day, continued Lasix 80 mg/daily, increased lactulose with goal 2-3 BM/day.  Na-MELD 16.  Plan is for follow-up in the [facility] clinic.  Abdominal pain attributed to umbilical hernia and this is being evaluated concomitantly with liver transplant work-up. Increasing dietary protein intake.   Had repeat therapeutic paracentesis on 03/03/2022 with 8 L removed.  No e/o SBP.  He would like to limit to 4-6 L removed, since 8L removed is tough for him. Feels better from a fluid standpoint, but still has pain around umbilical hernia.  Has appt with surgeon at [facility] to discuss hernia surgery this month. Doing daily weights.  No EtOH.  Had a great trip to [place] with his family.  Review of systems:     No chest pain, no SOB, no fevers, no urinary sx   Past Medical History:  Diagnosis Date   Allergy    Anxiety    Arthritis    Ascites    Diabetes (HCC)    Elevated transaminase level    Fatigue    Fatty liver    Hyperlipidemia    Hypertension    "under control", not on any blood pressure medication, has lost 60 lbs   Hypothyroidism    on synthroid   Inguinal muscle strain    Insomnia    Left knee pain    "long time ago"   Multinodular goiter    Obesity    Pneumonia    Psoriasis    Tinnitus     Patient's surgical history, family medical history, social history, medications and allergies were all reviewed in Epic    Current Outpatient Medications  Medication Sig Dispense Refill  busPIRone (BUSPAR) 15 MG tablet Take 7.5-15 mg by mouth 2 (two) times daily. Take 1/2 tablet (7.5 mg) in the morning and Take 1 tablet (15 mg) at supper     clonazePAM (KLONOPIN) 0.5 MG tablet Take 0.5 mg by mouth at bedtime.     colesevelam (WELCHOL) 625 MG tablet Take 625 mg by mouth daily with breakfast.      escitalopram (LEXAPRO) 5 MG tablet Take 5 mg by mouth daily.     eszopiclone (LUNESTA) 2 MG TABS tablet Take 2 mg by mouth at bedtime. Take immediately before bedtime     ezetimibe (ZETIA) 10 MG tablet Take 10 mg by mouth daily.     fluticasone (FLONASE) 50 MCG/ACT nasal spray Place 1 spray into both nostrils at bedtime.     folic acid (FOLVITE) 1 MG tablet Take 1 mg by mouth at bedtime.     furosemide (LASIX) 80 MG tablet Take 1 tablet (80 mg total) by mouth daily. 30 tablet 3   ipratropium (ATROVENT) 0.06 % nasal spray Place 1 spray into the nose daily.     lactulose (CHRONULAC) 10 GM/15ML solution Take 15 mLs (10 g total) by mouth 2 (two) times daily as needed for mild constipation (for bowel movments 2-3 per day).     levETIRAcetam (KEPPRA XR) 500 MG 24 hr tablet Take 2 tablets (1,000 mg total) by mouth daily. 180 tablet 3   levothyroxine (SYNTHROID) 88 MCG tablet Take 88 mcg by mouth every morning.     ondansetron (ZOFRAN ODT) 4 MG disintegrating tablet Take 1 tablet (4 mg total) by mouth every 8 (eight) hours as needed for nausea or vomiting. (Patient taking differently: Take 4 mg by mouth every 8 (eight) hours as needed for nausea or vomiting (dissolve orally).) 6 tablet 0   pantoprazole (PROTONIX) 20 MG tablet Take 1 tablet (20 mg total) by mouth 2 (two) times daily. Then take 1 tablet by mouth daily (Patient taking differently: Take 20 mg by mouth daily. Then take 1 tablet by mouth daily) 60 tablet 3   rifaximin (XIFAXAN) 550 MG TABS tablet Take 1 tablet (550 mg total) by mouth 2 (two) times daily. 180 tablet 3   spironolactone (ALDACTONE) 100 MG tablet Take 1 tablet (100 mg total) by mouth daily. Take 1 tablet with 50 mg tablet (150 mg total) by mouth daily. (Patient taking differently: Take 200 mg by mouth daily.) 60 tablet 0   spironolactone (ALDACTONE) 50 MG tablet Take 1 tablet (50 mg total) by mouth daily. Take 1 tablet with 100 mg tablet (150 mg total) by mouth daily. 60 tablet 0    thiamine 100 MG tablet Take 1 tablet (100 mg total) by mouth daily. 90 tablet 0   traMADol (ULTRAM) 50 MG tablet Take 1 tablet (50 mg total) by mouth at bedtime. 20 tablet 0   No current facility-administered medications for this visit.    Physical Exam:     BP 112/62   Pulse 72   Ht 5\' 6"  (1.676 m)   Wt 159 lb (72.1 kg)   BMI 25.66 kg/m   GENERAL:  Pleasant male in NAD PSYCH: : Cooperative, normal affect ABDOMEN: Umbilical hernia.  Mildly distended. SKIN:  turgor, no lesions seen Musculoskeletal:  Normal muscle tone, normal strength NEURO: Alert and oriented x 3, no focal neurologic deficits   IMPRESSION and PLAN:    1) Decompensated cirrhosis 2) Ascites 3) Hepatic encephalopathy 4) Portal hypertensive gastropathy  - Na-MELD 16 - Continue Lasix  and spironolactone - Check metabolic panel today given recent diuretic adjustment - AFP check.  Otherwise UTD on [facility] imaging - To continue follow-up with [facility] - Continue with intermittent therapeutic paracentesis.  Will coordinate with IR about creating a standing order.  Patient says he does better with 4-6 L removed, and tends to have volume shift sxs when 8 L removed - Briefly discussed the role/utility and indications of TIPS.  As he is establishing with [facility], would ultimately defer to them whether or not TIPS should be entertained, particularly in light of potential liver transplant - Resume rifaximin and lactulose - Continue complete abstinence of all EtOH - Continue low-sodium diet - Continue high-protein intake - Continue active lifestyle and exercise as tolerated  5) Umbilical hernia - Has appointment with [facility] later this month   - RTC in 6 months as he will f/u with [facility] in the interim  I spent 45 minutes of time, including in depth chart review, independent review of results as outlined above, communicating results with the patient directly, face-to-face time with the  patient, coordinating care, ordering studies and medications as appropriate, and documentation.       Verlin Dike Rion Catala ,DO, FACG 03/05/2022, 2:26 PM

## 2022-03-05 NOTE — Patient Instructions (Signed)
_______________________________________________________  If you are age 61 or older, your body mass index should be between 23-30. Your Body mass index is 25.66 kg/m. If this is out of the aforementioned range listed, please consider follow up with your Primary Care Provider.  If you are age 51 or younger, your body mass index should be between 19-25. Your Body mass index is 25.66 kg/m. If this is out of the aformentioned range listed, please consider follow up with your Primary Care Provider.   Your provider has requested that you go to the basement level for lab work before leaving today. Press "B" on the elevator. The lab is located at the first door on the left as you exit the elevator.  The [facility] providers would like to encourage you to use [facility] to communicate with providers for non-urgent requests or questions.  Due to long hold times on the telephone, sending your provider a message by [facility] may be a faster and more efficient way to get a response.  Please allow 48 business hours for a response.  Please remember that this is for non-urgent requests.   Due to recent changes in healthcare laws, you may see the results of your imaging and laboratory studies on MyChart before your provider has had a chance to review them.  We understand that in some cases there may be results that are confusing or concerning to you. Not all laboratory results come back in the same time frame and the provider may be waiting for multiple results in order to interpret others.  Please give [name] 48 hours in order for your provider to thoroughly review all the results before contacting the office for clarification of your results.  It was a pleasure to see you today!  Thank you for trusting me with your gastrointestinal care!

## 2022-03-07 LAB — AFP TUMOR MARKER: AFP-Tumor Marker: 5.8 ng/mL (ref <6.1)

## 2022-03-08 ENCOUNTER — Other Ambulatory Visit: Payer: Self-pay

## 2022-03-08 ENCOUNTER — Other Ambulatory Visit: Payer: Self-pay | Admitting: Gastroenterology

## 2022-03-08 ENCOUNTER — Telehealth: Payer: Self-pay

## 2022-03-08 ENCOUNTER — Other Ambulatory Visit (HOSPITAL_COMMUNITY): Payer: Self-pay | Admitting: Gastroenterology

## 2022-03-08 DIAGNOSIS — K7031 Alcoholic cirrhosis of liver with ascites: Secondary | ICD-10-CM

## 2022-03-08 DIAGNOSIS — K7460 Unspecified cirrhosis of liver: Secondary | ICD-10-CM

## 2022-03-08 LAB — CULTURE, BODY FLUID W GRAM STAIN -BOTTLE: Culture: NO GROWTH

## 2022-03-08 NOTE — Telephone Encounter (Signed)
-----   Message from [facility], DO sent at 03/05/2022  4:53 PM EDT ----- Can you please look into how we create a standing order with IR for paracentesis please patient?  He said the last time he had a therapeutic paracentesis they sent we can set up for standing order so that he does not always need to call us.  If that is possible, can create order for paracentesis to remove no more than 6 L.  Infuse with albumin 6-8 g/L removed.  Send fluid for cell count, Gram stain, culture.  Thanks.

## 2022-03-08 NOTE — Telephone Encounter (Signed)
Standing order for paracentesis faxed to IR at [phone]. Called pt's wife and let her know about standing order and gave her number to radiology scheduling. Pt's wife verbalized understanding and had no other concerns at end of call.

## 2022-03-10 ENCOUNTER — Ambulatory Visit (HOSPITAL_COMMUNITY)
Admission: RE | Admit: 2022-03-10 | Discharge: 2022-03-10 | Disposition: A | Discharge status: Home / Self Care | Payer: BC Managed Care – PPO | Source: Ambulatory Visit | Attending: Gastroenterology | Admitting: Gastroenterology

## 2022-03-10 DIAGNOSIS — K7031 Alcoholic cirrhosis of liver with ascites: Secondary | ICD-10-CM | POA: Insufficient documentation

## 2022-03-10 DIAGNOSIS — R188 Other ascites: Secondary | ICD-10-CM | POA: Diagnosis not present

## 2022-03-10 HISTORY — PX: IR PARACENTESIS: IMG2679

## 2022-03-10 LAB — GRAM STAIN

## 2022-03-10 LAB — BODY FLUID CELL COUNT WITH DIFFERENTIAL
Eos, Fluid: 0 %
Lymphs, Fluid: 21 %
Monocyte-Macrophage-Serous Fluid: 76 % (ref 50–90)
Neutrophil Count, Fluid: 3 % (ref 0–25)
Total Nucleated Cell Count, Fluid: 78 cu mm (ref 0–1000)

## 2022-03-10 MED ORDER — LIDOCAINE HCL 1 % IJ SOLN
INTRAMUSCULAR | Status: AC
  Filled 2022-03-10: qty 20

## 2022-03-10 MED ORDER — LIDOCAINE HCL (PF) 1 % IJ SOLN
INTRAMUSCULAR | Status: DC | PRN
  Administered 2022-03-10: 5 mL

## 2022-03-10 NOTE — Procedures (Signed)
PROCEDURE SUMMARY:  Successful ultrasound guided diagnostic and therapeutic paracentesis from the RLQ. Yielded 4.7 L of hazy, yellow fluid.  No immediate complications.  The patient tolerated the procedure well.   Specimen was sent for labs.  EBL < 1 mL  The patient has previously been formally evaluated by the [facility]pert[facility] and is being actively followed for potential future intervention.    Alex Gardener, AGNP-BC 03/10/2022, 2:37 PM

## 2022-03-13 LAB — PATHOLOGIST SMEAR REVIEW

## 2022-03-15 ENCOUNTER — Other Ambulatory Visit (HOSPITAL_COMMUNITY): Payer: Self-pay | Admitting: Gastroenterology

## 2022-03-15 DIAGNOSIS — K7031 Alcoholic cirrhosis of liver with ascites: Secondary | ICD-10-CM

## 2022-03-15 LAB — CULTURE, BODY FLUID W GRAM STAIN -BOTTLE: Culture: NO GROWTH

## 2022-03-16 ENCOUNTER — Ambulatory Visit (HOSPITAL_COMMUNITY)
Admission: RE | Admit: 2022-03-16 | Discharge: 2022-03-16 | Disposition: A | Discharge status: Home / Self Care | Payer: BC Managed Care – PPO | Source: Ambulatory Visit | Attending: Gastroenterology | Admitting: Gastroenterology

## 2022-03-16 DIAGNOSIS — K7031 Alcoholic cirrhosis of liver with ascites: Secondary | ICD-10-CM | POA: Insufficient documentation

## 2022-03-16 DIAGNOSIS — R188 Other ascites: Secondary | ICD-10-CM | POA: Diagnosis not present

## 2022-03-16 HISTORY — PX: IR PARACENTESIS: IMG2679

## 2022-03-16 LAB — BODY FLUID CELL COUNT WITH DIFFERENTIAL
Eos, Fluid: 0 %
Lymphs, Fluid: 58 %
Monocyte-Macrophage-Serous Fluid: 39 % — ABNORMAL LOW (ref 50–90)
Neutrophil Count, Fluid: 3 % (ref 0–25)
Total Nucleated Cell Count, Fluid: 141 cu mm (ref 0–1000)

## 2022-03-16 LAB — GRAM STAIN

## 2022-03-16 MED ORDER — LIDOCAINE HCL 1 % IJ SOLN
INTRAMUSCULAR | Status: AC
  Filled 2022-03-16: qty 20

## 2022-03-16 MED ORDER — LIDOCAINE HCL (PF) 1 % IJ SOLN
INTRAMUSCULAR | Status: DC | PRN
  Administered 2022-03-16: 10 mL

## 2022-03-16 NOTE — Procedures (Signed)
PROCEDURE SUMMARY:  Successful US guided paracentesis from right lateral abdomen.  Yielded 4.1 liters of clear yellow fluid.  No immediate complications.  Patient tolerated well.  EBL = trace  Specimen was sent for labs.  Avayah Raffety S Takiah Maiden PA-C 03/16/2022 2:17 PM

## 2022-03-17 LAB — PATHOLOGIST SMEAR REVIEW

## 2022-03-21 LAB — CULTURE, BODY FLUID W GRAM STAIN -BOTTLE: Culture: NO GROWTH

## 2022-03-23 ENCOUNTER — Ambulatory Visit (HOSPITAL_COMMUNITY)
Admission: RE | Admit: 2022-03-23 | Discharge: 2022-03-23 | Disposition: A | Discharge status: Home / Self Care | Payer: BC Managed Care – PPO | Source: Ambulatory Visit | Attending: Gastroenterology | Admitting: Gastroenterology

## 2022-03-23 DIAGNOSIS — K7031 Alcoholic cirrhosis of liver with ascites: Secondary | ICD-10-CM | POA: Diagnosis not present

## 2022-03-23 DIAGNOSIS — R188 Other ascites: Secondary | ICD-10-CM | POA: Diagnosis not present

## 2022-03-23 DIAGNOSIS — K7469 Other cirrhosis of liver: Secondary | ICD-10-CM | POA: Diagnosis not present

## 2022-03-23 HISTORY — PX: IR PARACENTESIS: IMG2679

## 2022-03-23 LAB — BODY FLUID CELL COUNT WITH DIFFERENTIAL
Eos, Fluid: 0 %
Lymphs, Fluid: 65 %
Monocyte-Macrophage-Serous Fluid: 33 % — ABNORMAL LOW (ref 50–90)
Neutrophil Count, Fluid: 2 % (ref 0–25)
Total Nucleated Cell Count, Fluid: 168 cu mm (ref 0–1000)

## 2022-03-23 LAB — GRAM STAIN

## 2022-03-23 MED ORDER — LIDOCAINE HCL 1 % IJ SOLN
INTRAMUSCULAR | Status: AC
  Filled 2022-03-23: qty 20

## 2022-03-23 MED ORDER — LIDOCAINE HCL 1 % IJ SOLN
INTRAMUSCULAR | Status: DC | PRN
  Administered 2022-03-23: 10 mL via INTRADERMAL

## 2022-03-23 NOTE — Procedures (Signed)
PROCEDURE SUMMARY:  Successful US guided paracentesis from RLQ.  Yielded 4.5L of ascitic fluid.  No immediate complications.  Pt tolerated well.   Specimen was sent for labs.  EBL < 55mL  Vedanth Sirico PA-C 03/23/2022 2:56 PM

## 2022-03-25 LAB — PATHOLOGIST SMEAR REVIEW

## 2022-03-28 LAB — CULTURE, BODY FLUID W GRAM STAIN -BOTTLE: Culture: NO GROWTH

## 2022-03-29 ENCOUNTER — Ambulatory Visit (HOSPITAL_COMMUNITY)
Admission: RE | Admit: 2022-03-29 | Discharge: 2022-03-29 | Disposition: A | Discharge status: Home / Self Care | Payer: BC Managed Care – PPO | Source: Ambulatory Visit | Attending: Gastroenterology | Admitting: Gastroenterology

## 2022-03-29 DIAGNOSIS — K7031 Alcoholic cirrhosis of liver with ascites: Secondary | ICD-10-CM | POA: Insufficient documentation

## 2022-03-29 DIAGNOSIS — R188 Other ascites: Secondary | ICD-10-CM | POA: Diagnosis not present

## 2022-03-29 DIAGNOSIS — K7460 Unspecified cirrhosis of liver: Secondary | ICD-10-CM | POA: Diagnosis not present

## 2022-03-29 HISTORY — PX: IR PARACENTESIS: IMG2679

## 2022-03-29 LAB — BODY FLUID CELL COUNT WITH DIFFERENTIAL
Eos, Fluid: 0 %
Lymphs, Fluid: 55 %
Monocyte-Macrophage-Serous Fluid: 43 % — ABNORMAL LOW (ref 50–90)
Neutrophil Count, Fluid: 2 % (ref 0–25)
Total Nucleated Cell Count, Fluid: 200 cu mm (ref 0–1000)

## 2022-03-29 LAB — GRAM STAIN: Gram Stain: NONE SEEN

## 2022-03-29 MED ORDER — ALBUMIN HUMAN 25 % IV SOLN
25.0000 g | Freq: Once | INTRAVENOUS | Status: AC
  Administered 2022-03-29: 25 g via INTRAVENOUS

## 2022-03-29 MED ORDER — LIDOCAINE HCL 1 % IJ SOLN
INTRAMUSCULAR | Status: AC
  Administered 2022-03-29: 10 mL
  Filled 2022-03-29: qty 20

## 2022-03-29 MED ORDER — ALBUMIN HUMAN 25 % IV SOLN
INTRAVENOUS | Status: AC
  Filled 2022-03-29: qty 100

## 2022-03-29 NOTE — Procedures (Signed)
PROCEDURE SUMMARY:  Successful ultrasound guided paracentesis from the right lower quadrant.  Yielded 2.4 L of clear yellow fluid.  No immediate complications.  The patient tolerated the procedure well.   Specimen sent for labs.  EBL < 2 mL  The patient has previously been formally evaluated by the [facility] Radiology [facility] Hypertension [facility] and is being actively followed for potential future intervention.  ** Patient currently followed by [facility] for possible transplant and TIPS. Next follow up with [facility] is in November per patient.  [address] [place], [place] [phone] 03/29/2022, 2:53 PM

## 2022-03-30 LAB — PATHOLOGIST SMEAR REVIEW

## 2022-04-02 ENCOUNTER — Other Ambulatory Visit: Payer: Self-pay | Admitting: Gastroenterology

## 2022-04-02 ENCOUNTER — Other Ambulatory Visit (HOSPITAL_COMMUNITY): Payer: BC Managed Care – PPO

## 2022-04-02 ENCOUNTER — Other Ambulatory Visit (HOSPITAL_COMMUNITY): Payer: Self-pay | Admitting: Gastroenterology

## 2022-04-02 DIAGNOSIS — K7031 Alcoholic cirrhosis of liver with ascites: Secondary | ICD-10-CM

## 2022-04-03 LAB — CULTURE, BODY FLUID W GRAM STAIN -BOTTLE: Culture: NO GROWTH

## 2022-04-06 ENCOUNTER — Ambulatory Visit (HOSPITAL_COMMUNITY)
Admission: RE | Admit: 2022-04-06 | Discharge: 2022-04-06 | Disposition: A | Discharge status: Home / Self Care | Payer: BC Managed Care – PPO | Source: Ambulatory Visit | Attending: Gastroenterology | Admitting: Gastroenterology

## 2022-04-06 DIAGNOSIS — K7031 Alcoholic cirrhosis of liver with ascites: Secondary | ICD-10-CM

## 2022-04-06 DIAGNOSIS — R188 Other ascites: Secondary | ICD-10-CM | POA: Diagnosis not present

## 2022-04-06 DIAGNOSIS — K7460 Unspecified cirrhosis of liver: Secondary | ICD-10-CM | POA: Diagnosis not present

## 2022-04-06 HISTORY — PX: IR PARACENTESIS: IMG2679

## 2022-04-06 MED ORDER — LIDOCAINE HCL 1 % IJ SOLN
INTRAMUSCULAR | Status: AC
  Filled 2022-04-06: qty 20

## 2022-04-06 MED ORDER — LIDOCAINE HCL (PF) 1 % IJ SOLN
INTRAMUSCULAR | Status: AC | PRN
  Administered 2022-04-06: 5 mL

## 2022-04-12 ENCOUNTER — Other Ambulatory Visit (HOSPITAL_COMMUNITY): Payer: Self-pay | Admitting: Gastroenterology

## 2022-04-12 DIAGNOSIS — K7031 Alcoholic cirrhosis of liver with ascites: Secondary | ICD-10-CM

## 2022-04-16 ENCOUNTER — Other Ambulatory Visit (HOSPITAL_COMMUNITY): Payer: BC Managed Care – PPO

## 2022-04-19 DIAGNOSIS — K429 Umbilical hernia without obstruction or gangrene: Secondary | ICD-10-CM | POA: Diagnosis not present

## 2022-04-19 DIAGNOSIS — K7030 Alcoholic cirrhosis of liver without ascites: Secondary | ICD-10-CM | POA: Diagnosis not present

## 2022-04-20 ENCOUNTER — Ambulatory Visit (HOSPITAL_COMMUNITY)
Admission: RE | Admit: 2022-04-20 | Discharge: 2022-04-20 | Disposition: A | Discharge status: Home / Self Care | Payer: BC Managed Care – PPO | Source: Ambulatory Visit | Attending: Gastroenterology | Admitting: Gastroenterology

## 2022-04-20 DIAGNOSIS — K7031 Alcoholic cirrhosis of liver with ascites: Secondary | ICD-10-CM | POA: Diagnosis not present

## 2022-04-20 DIAGNOSIS — K7460 Unspecified cirrhosis of liver: Secondary | ICD-10-CM | POA: Diagnosis not present

## 2022-04-20 DIAGNOSIS — R188 Other ascites: Secondary | ICD-10-CM | POA: Diagnosis not present

## 2022-04-20 HISTORY — PX: IR PARACENTESIS: IMG2679

## 2022-04-20 MED ORDER — LIDOCAINE HCL 1 % IJ SOLN
INTRAMUSCULAR | Status: AC
  Filled 2022-04-20: qty 20

## 2022-04-20 MED ORDER — ALBUMIN HUMAN 25 % IV SOLN
INTRAVENOUS | Status: AC
  Administered 2022-04-20: 50 g via INTRAVENOUS
  Filled 2022-04-20: qty 200

## 2022-04-20 MED ORDER — LIDOCAINE HCL (PF) 1 % IJ SOLN
INTRAMUSCULAR | Status: DC | PRN
  Administered 2022-04-20: 5 mL

## 2022-04-20 MED ORDER — ALBUMIN HUMAN 25 % IV SOLN
50.0000 g | Freq: Once | INTRAVENOUS | Status: AC
  Filled 2022-04-20: qty 200

## 2022-04-20 NOTE — Procedures (Signed)
PROCEDURE SUMMARY:  Successful ultrasound guided paracentesis from the right lower quadrant.  Yielded 7.3 L of clear yellow fluid.  No immediate complications.  The patient tolerated the procedure well.   Specimen not sent for labs.  EBL < 2 mL  The patient has previously been formally evaluated by the [place] [facility] and is being actively followed for potential future intervention.  ** Patient currently followed by [facility] for possible transplant and TIPS. Next follow up with [facility] is in November per patient.  [address] [place], [place] [phone] 04/20/2022, 3:05 PM

## 2022-04-27 DIAGNOSIS — E039 Hypothyroidism, unspecified: Secondary | ICD-10-CM | POA: Diagnosis not present

## 2022-04-27 DIAGNOSIS — F5104 Psychophysiologic insomnia: Secondary | ICD-10-CM | POA: Diagnosis not present

## 2022-04-27 DIAGNOSIS — E7800 Pure hypercholesterolemia, unspecified: Secondary | ICD-10-CM | POA: Diagnosis not present

## 2022-04-27 DIAGNOSIS — E1169 Type 2 diabetes mellitus with other specified complication: Secondary | ICD-10-CM | POA: Diagnosis not present

## 2022-04-27 DIAGNOSIS — I10 Essential (primary) hypertension: Secondary | ICD-10-CM | POA: Diagnosis not present

## 2022-04-27 DIAGNOSIS — K7031 Alcoholic cirrhosis of liver with ascites: Secondary | ICD-10-CM | POA: Diagnosis not present

## 2022-05-04 ENCOUNTER — Ambulatory Visit (HOSPITAL_COMMUNITY)
Admission: RE | Admit: 2022-05-04 | Discharge: 2022-05-04 | Disposition: A | Discharge status: Home / Self Care | Payer: Medicare Other | Source: Ambulatory Visit | Attending: Gastroenterology | Admitting: Gastroenterology

## 2022-05-04 DIAGNOSIS — R188 Other ascites: Secondary | ICD-10-CM

## 2022-05-04 DIAGNOSIS — K7031 Alcoholic cirrhosis of liver with ascites: Secondary | ICD-10-CM | POA: Diagnosis not present

## 2022-05-04 HISTORY — PX: IR PARACENTESIS: IMG2679

## 2022-05-04 LAB — BODY FLUID CELL COUNT WITH DIFFERENTIAL
Eos, Fluid: 0 %
Lymphs, Fluid: 63 %
Monocyte-Macrophage-Serous Fluid: 35 % — ABNORMAL LOW (ref 50–90)
Neutrophil Count, Fluid: 2 % (ref 0–25)
Total Nucleated Cell Count, Fluid: 130 cu mm (ref 0–1000)

## 2022-05-04 LAB — GRAM STAIN

## 2022-05-04 MED ORDER — LIDOCAINE HCL 1 % IJ SOLN
INTRAMUSCULAR | Status: AC
  Administered 2022-05-04: 10 mL
  Filled 2022-05-04: qty 20

## 2022-05-05 LAB — PATHOLOGIST SMEAR REVIEW

## 2022-05-06 DIAGNOSIS — K7290 Hepatic failure, unspecified without coma: Secondary | ICD-10-CM | POA: Diagnosis not present

## 2022-05-06 DIAGNOSIS — K7460 Unspecified cirrhosis of liver: Secondary | ICD-10-CM | POA: Diagnosis not present

## 2022-05-07 IMAGING — DX DG CHEST 2V
2 series · 2 of 2 positions shown · non-contrast
Comparison: Chest x-ray 07/03/2021.

CLINICAL DATA: Dysphasia.  Jaundice.

EXAM:
CHEST - 2 VIEW

[chest pa]
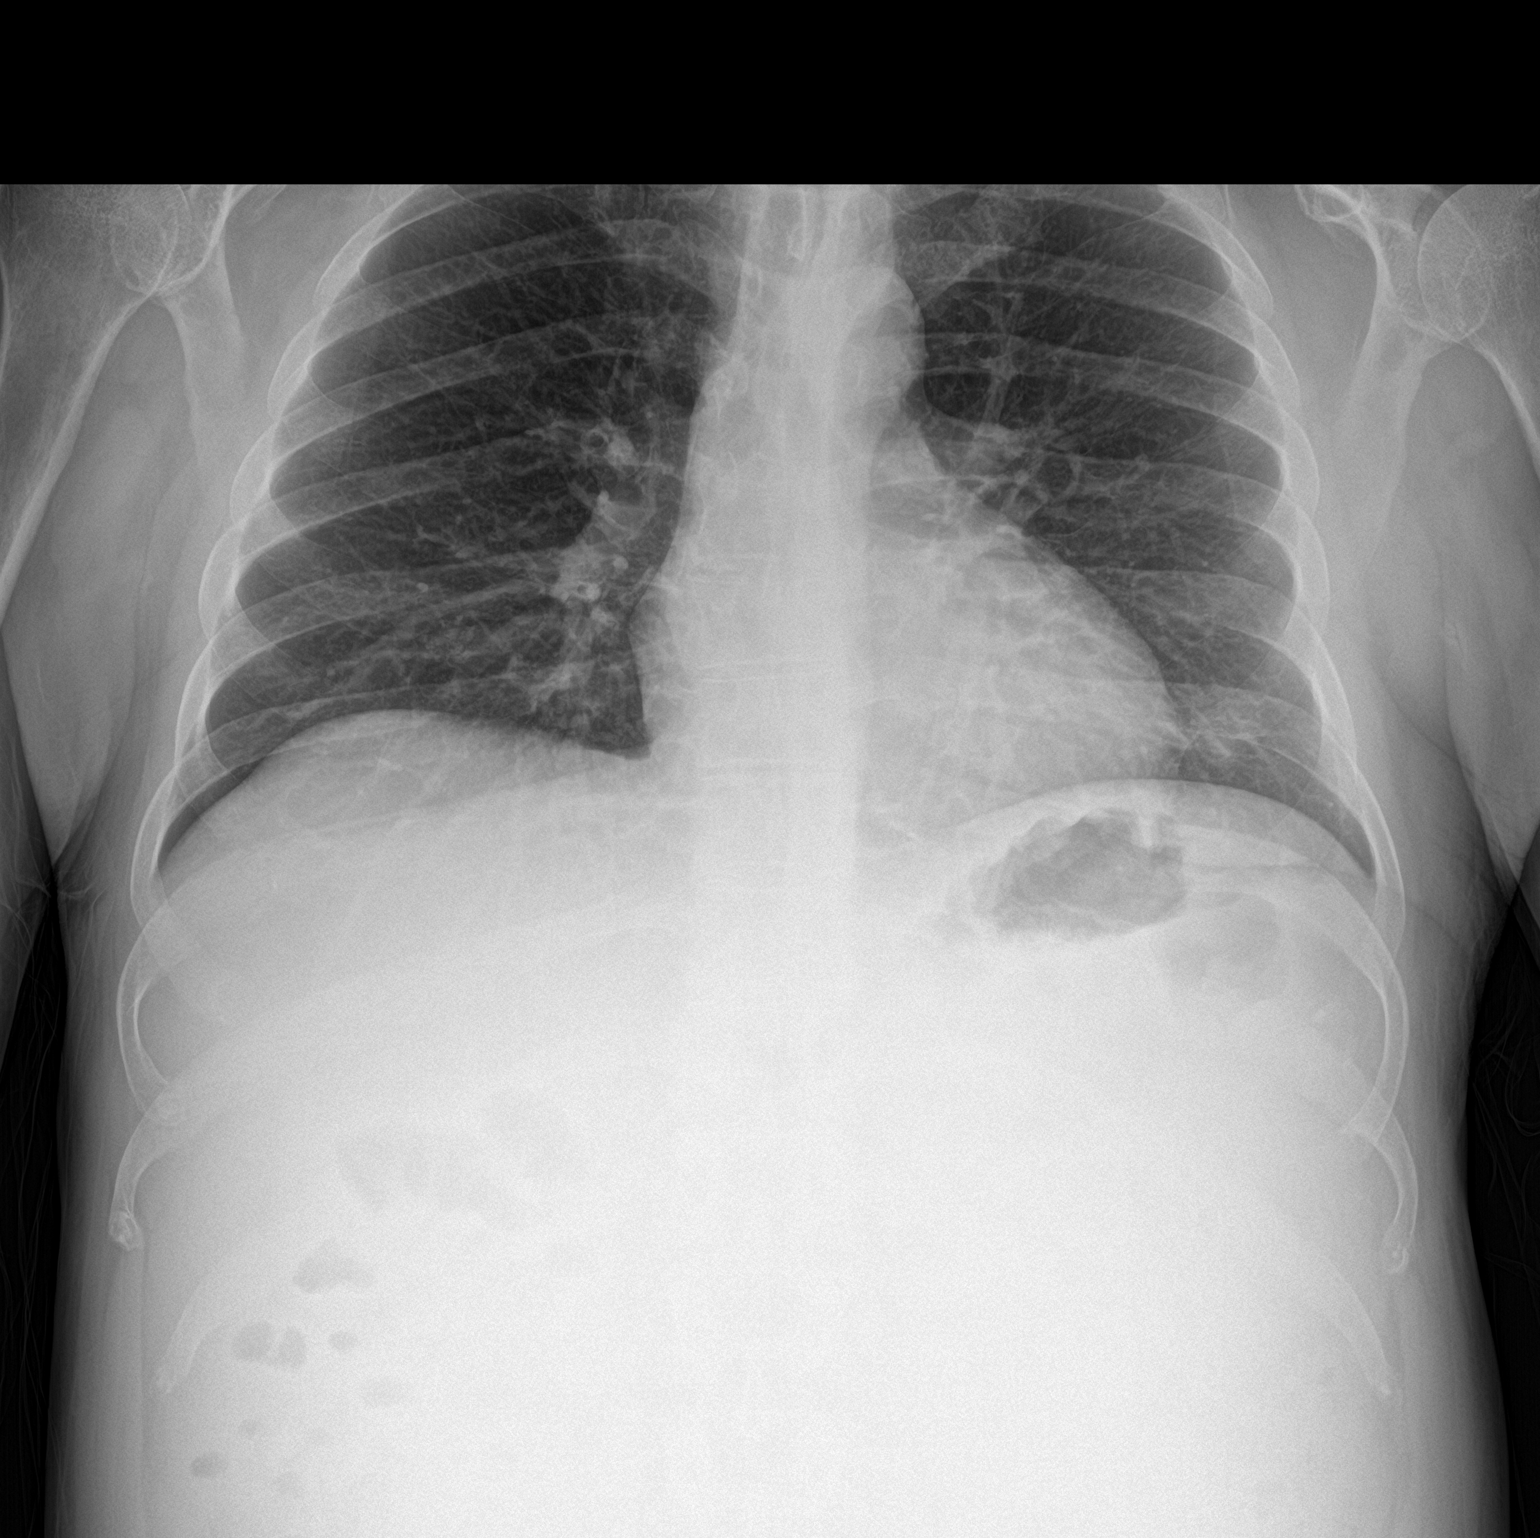

[chest lat]
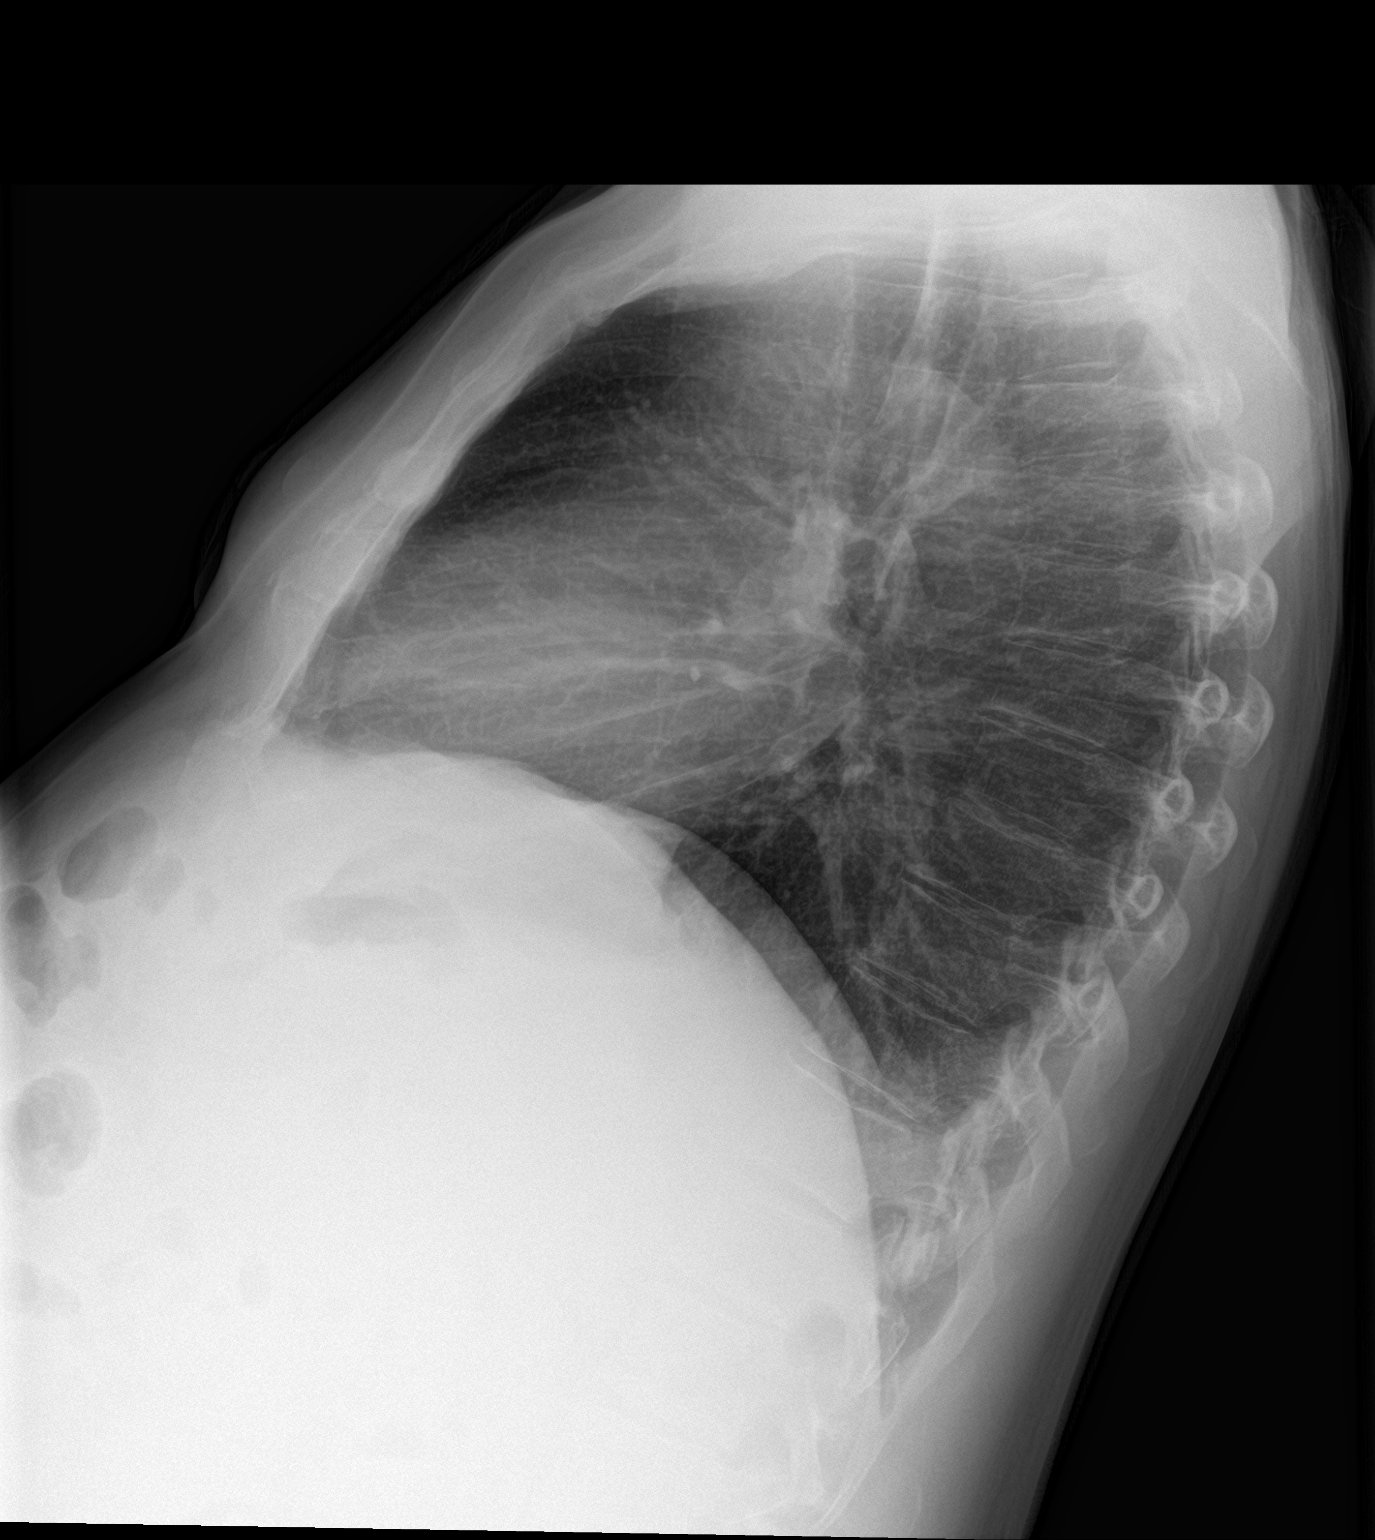

[2 of 2 positions shown; findings below may reference images not displayed]

FINDINGS: There is some minimal hazy opacities in the posterior left lower
lobe seen only on the lateral view. There is no pleural effusion or
pneumothorax. Cardiomediastinal silhouette is within normal limits.
IMPRESSION: 1. Minimal posterior left lower lobe airspace disease worrisome for
infection. Followup PA and lateral chest X-ray is recommended in 3-4
weeks following trial of antibiotic therapy to ensure resolution and
exclude underlying malignancy.

## 2022-05-09 LAB — CULTURE, BODY FLUID W GRAM STAIN -BOTTLE: Culture: NO GROWTH

## 2022-05-13 ENCOUNTER — Other Ambulatory Visit (HOSPITAL_COMMUNITY): Payer: Self-pay | Admitting: Gastroenterology

## 2022-05-13 ENCOUNTER — Other Ambulatory Visit: Payer: Self-pay | Admitting: Gastroenterology

## 2022-05-13 DIAGNOSIS — K7031 Alcoholic cirrhosis of liver with ascites: Secondary | ICD-10-CM

## 2022-05-17 ENCOUNTER — Other Ambulatory Visit: Payer: Self-pay | Admitting: Gastroenterology

## 2022-05-20 ENCOUNTER — Ambulatory Visit (HOSPITAL_COMMUNITY)
Admission: RE | Admit: 2022-05-20 | Discharge: 2022-05-20 | Disposition: A | Discharge status: Home / Self Care | Payer: Medicare Other | Source: Ambulatory Visit | Attending: Gastroenterology | Admitting: Gastroenterology

## 2022-05-20 DIAGNOSIS — K7031 Alcoholic cirrhosis of liver with ascites: Secondary | ICD-10-CM | POA: Diagnosis not present

## 2022-05-20 DIAGNOSIS — R188 Other ascites: Secondary | ICD-10-CM | POA: Diagnosis not present

## 2022-05-20 DIAGNOSIS — K7460 Unspecified cirrhosis of liver: Secondary | ICD-10-CM | POA: Diagnosis not present

## 2022-05-20 HISTORY — PX: IR PARACENTESIS: IMG2679

## 2022-05-20 LAB — GRAM STAIN

## 2022-05-20 MED ORDER — LIDOCAINE HCL 1 % IJ SOLN
5.0000 mL | Freq: Once | INTRAMUSCULAR | Status: AC
  Administered 2022-05-20: 5 mL via INTRADERMAL

## 2022-05-20 MED ORDER — ALBUMIN HUMAN 25 % IV SOLN
INTRAVENOUS | Status: AC
  Filled 2022-05-20: qty 200

## 2022-05-20 MED ORDER — LIDOCAINE HCL 1 % IJ SOLN
INTRAMUSCULAR | Status: AC
  Filled 2022-05-20: qty 20

## 2022-05-20 MED ORDER — ALBUMIN HUMAN 25 % IV SOLN
50.0000 g | Freq: Once | INTRAVENOUS | Status: AC
  Administered 2022-05-20: 50 g via INTRAVENOUS

## 2022-05-20 NOTE — Procedures (Signed)
PROCEDURE SUMMARY:  Successful ultrasound guided paracentesis from the RLQ Yielded 6.0 of yellow, opaque fluid.  No immediate complications.  The patient tolerated the procedure well.   Specimen was sent for labs.  EBL < 55mL  ** Patient currently followed by [facility] for possible transplant and TIPS. Next follow up with [facility] is in November per patient.  **   Electronically Signed: Pasty Spillers, PA 11/12/2021, 2:42 PM

## 2022-05-25 LAB — CULTURE, BODY FLUID W GRAM STAIN -BOTTLE: Culture: NO GROWTH

## 2022-05-26 ENCOUNTER — Other Ambulatory Visit: Payer: Self-pay

## 2022-05-26 ENCOUNTER — Emergency Department (HOSPITAL_COMMUNITY): Payer: Medicare Other

## 2022-05-26 ENCOUNTER — Encounter (HOSPITAL_COMMUNITY): Payer: Self-pay | Admitting: *Deleted

## 2022-05-26 ENCOUNTER — Emergency Department (HOSPITAL_COMMUNITY)
Admission: EM | Admit: 2022-05-26 | Discharge: 2022-05-26 | Discharge status: Against Medical Advice | Payer: Medicare Other | Source: Home / Self Care | Attending: Emergency Medicine | Admitting: Emergency Medicine

## 2022-05-26 ENCOUNTER — Encounter: Payer: Self-pay | Admitting: Interventional Radiology

## 2022-05-26 DIAGNOSIS — Z87898 Personal history of other specified conditions: Secondary | ICD-10-CM | POA: Diagnosis not present

## 2022-05-26 DIAGNOSIS — F419 Anxiety disorder, unspecified: Secondary | ICD-10-CM | POA: Diagnosis present

## 2022-05-26 DIAGNOSIS — F418 Other specified anxiety disorders: Secondary | ICD-10-CM | POA: Diagnosis not present

## 2022-05-26 DIAGNOSIS — K429 Umbilical hernia without obstruction or gangrene: Secondary | ICD-10-CM | POA: Diagnosis not present

## 2022-05-26 DIAGNOSIS — R944 Abnormal results of kidney function studies: Secondary | ICD-10-CM | POA: Insufficient documentation

## 2022-05-26 DIAGNOSIS — R1033 Periumbilical pain: Secondary | ICD-10-CM

## 2022-05-26 DIAGNOSIS — R7309 Other abnormal glucose: Secondary | ICD-10-CM | POA: Insufficient documentation

## 2022-05-26 DIAGNOSIS — R1084 Generalized abdominal pain: Secondary | ICD-10-CM | POA: Diagnosis not present

## 2022-05-26 DIAGNOSIS — E871 Hypo-osmolality and hyponatremia: Secondary | ICD-10-CM | POA: Diagnosis not present

## 2022-05-26 DIAGNOSIS — F32A Depression, unspecified: Secondary | ICD-10-CM | POA: Diagnosis not present

## 2022-05-26 DIAGNOSIS — N179 Acute kidney failure, unspecified: Secondary | ICD-10-CM | POA: Diagnosis not present

## 2022-05-26 DIAGNOSIS — G40909 Epilepsy, unspecified, not intractable, without status epilepticus: Secondary | ICD-10-CM | POA: Diagnosis not present

## 2022-05-26 DIAGNOSIS — Z5329 Procedure and treatment not carried out because of patient's decision for other reasons: Secondary | ICD-10-CM | POA: Insufficient documentation

## 2022-05-26 DIAGNOSIS — D649 Anemia, unspecified: Secondary | ICD-10-CM | POA: Diagnosis not present

## 2022-05-26 DIAGNOSIS — Z888 Allergy status to other drugs, medicaments and biological substances status: Secondary | ICD-10-CM | POA: Diagnosis not present

## 2022-05-26 DIAGNOSIS — F1011 Alcohol abuse, in remission: Secondary | ICD-10-CM | POA: Diagnosis not present

## 2022-05-26 DIAGNOSIS — E039 Hypothyroidism, unspecified: Secondary | ICD-10-CM | POA: Diagnosis not present

## 2022-05-26 DIAGNOSIS — D72829 Elevated white blood cell count, unspecified: Secondary | ICD-10-CM | POA: Insufficient documentation

## 2022-05-26 DIAGNOSIS — E8809 Other disorders of plasma-protein metabolism, not elsewhere classified: Secondary | ICD-10-CM | POA: Diagnosis not present

## 2022-05-26 DIAGNOSIS — R109 Unspecified abdominal pain: Secondary | ICD-10-CM | POA: Diagnosis not present

## 2022-05-26 DIAGNOSIS — Z79890 Hormone replacement therapy (postmenopausal): Secondary | ICD-10-CM | POA: Diagnosis not present

## 2022-05-26 DIAGNOSIS — Z880 Allergy status to penicillin: Secondary | ICD-10-CM | POA: Diagnosis not present

## 2022-05-26 DIAGNOSIS — R112 Nausea with vomiting, unspecified: Secondary | ICD-10-CM | POA: Diagnosis not present

## 2022-05-26 DIAGNOSIS — K767 Hepatorenal syndrome: Secondary | ICD-10-CM | POA: Diagnosis not present

## 2022-05-26 DIAGNOSIS — Z79899 Other long term (current) drug therapy: Secondary | ICD-10-CM | POA: Diagnosis not present

## 2022-05-26 DIAGNOSIS — I10 Essential (primary) hypertension: Secondary | ICD-10-CM | POA: Diagnosis not present

## 2022-05-26 DIAGNOSIS — R54 Age-related physical debility: Secondary | ICD-10-CM | POA: Diagnosis present

## 2022-05-26 DIAGNOSIS — E119 Type 2 diabetes mellitus without complications: Secondary | ICD-10-CM | POA: Diagnosis not present

## 2022-05-26 DIAGNOSIS — K7031 Alcoholic cirrhosis of liver with ascites: Secondary | ICD-10-CM | POA: Diagnosis not present

## 2022-05-26 DIAGNOSIS — D684 Acquired coagulation factor deficiency: Secondary | ICD-10-CM | POA: Diagnosis not present

## 2022-05-26 DIAGNOSIS — K2100 Gastro-esophageal reflux disease with esophagitis, without bleeding: Secondary | ICD-10-CM | POA: Diagnosis present

## 2022-05-26 DIAGNOSIS — K652 Spontaneous bacterial peritonitis: Secondary | ICD-10-CM | POA: Diagnosis not present

## 2022-05-26 DIAGNOSIS — K760 Fatty (change of) liver, not elsewhere classified: Secondary | ICD-10-CM | POA: Diagnosis present

## 2022-05-26 DIAGNOSIS — E785 Hyperlipidemia, unspecified: Secondary | ICD-10-CM | POA: Diagnosis not present

## 2022-05-26 LAB — COMPREHENSIVE METABOLIC PANEL
ALT: 16 U/L (ref 0–44)
AST: 33 U/L (ref 15–41)
Albumin: 3.3 g/dL — ABNORMAL LOW (ref 3.5–5.0)
Alkaline Phosphatase: 87 U/L (ref 38–126)
Anion gap: 11 (ref 5–15)
BUN: 16 mg/dL (ref 6–20)
CO2: 18 mmol/L — ABNORMAL LOW (ref 22–32)
Calcium: 8.5 mg/dL — ABNORMAL LOW (ref 8.9–10.3)
Chloride: 96 mmol/L — ABNORMAL LOW (ref 98–111)
Creatinine, Ser: 1.54 mg/dL — ABNORMAL HIGH (ref 0.61–1.24)
GFR, Estimated: 51 mL/min — ABNORMAL LOW (ref >60)
Glucose, Bld: 124 mg/dL — ABNORMAL HIGH (ref 70–99)
Potassium: 3.6 mmol/L (ref 3.5–5.1)
Sodium: 125 mmol/L — ABNORMAL LOW (ref 135–145)
Total Bilirubin: 4.9 mg/dL — ABNORMAL HIGH (ref 0.3–1.2)
Total Protein: 5.9 g/dL — ABNORMAL LOW (ref 6.5–8.1)

## 2022-05-26 LAB — CBC
HCT: 30 % — ABNORMAL LOW (ref 39.0–52.0)
Hemoglobin: 10.9 g/dL — ABNORMAL LOW (ref 13.0–17.0)
MCH: 34.3 pg — ABNORMAL HIGH (ref 26.0–34.0)
MCHC: 36.3 g/dL — ABNORMAL HIGH (ref 30.0–36.0)
MCV: 94.3 fL (ref 80.0–100.0)
Platelets: 297 10*3/uL (ref 150–400)
RBC: 3.18 MIL/uL — ABNORMAL LOW (ref 4.22–5.81)
RDW: 12.9 % (ref 11.5–15.5)
WBC: 13.6 10*3/uL — ABNORMAL HIGH (ref 4.0–10.5)
nRBC: 0 % (ref 0.0–0.2)

## 2022-05-26 LAB — BODY FLUID CELL COUNT WITH DIFFERENTIAL
Eos, Fluid: 0 %
Lymphs, Fluid: 2 %
Monocyte-Macrophage-Serous Fluid: 28 % — ABNORMAL LOW (ref 50–90)
Neutrophil Count, Fluid: 70 % — ABNORMAL HIGH (ref 0–25)
Total Nucleated Cell Count, Fluid: 5511 cu mm — ABNORMAL HIGH (ref 0–1000)

## 2022-05-26 LAB — URINALYSIS, ROUTINE W REFLEX MICROSCOPIC
Bilirubin Urine: NEGATIVE
Glucose, UA: NEGATIVE mg/dL
Hgb urine dipstick: NEGATIVE
Ketones, ur: NEGATIVE mg/dL
Leukocytes,Ua: NEGATIVE
Nitrite: NEGATIVE
Protein, ur: NEGATIVE mg/dL
Specific Gravity, Urine: 1.031 — ABNORMAL HIGH (ref 1.005–1.030)
pH: 5 (ref 5.0–8.0)

## 2022-05-26 LAB — LIPASE, BLOOD: Lipase: 43 U/L (ref 11–51)

## 2022-05-26 LAB — GLUCOSE, PLEURAL OR PERITONEAL FLUID: Glucose, Fluid: 125 mg/dL

## 2022-05-26 LAB — LACTATE DEHYDROGENASE, PLEURAL OR PERITONEAL FLUID: LD, Fluid: 39 U/L — ABNORMAL HIGH (ref 3–23)

## 2022-05-26 LAB — PROTEIN, PLEURAL OR PERITONEAL FLUID: Total protein, fluid: <3 g/dL

## 2022-05-26 LAB — AMMONIA: Ammonia: 90 umol/L — ABNORMAL HIGH (ref 9–35)

## 2022-05-26 LAB — ALBUMIN, PLEURAL OR PERITONEAL FLUID: Albumin, Fluid: <1.5 g/dL

## 2022-05-26 MED ORDER — LIDOCAINE HCL 1 % IJ SOLN
INTRAMUSCULAR | Status: AC
  Administered 2022-05-26: 15 mL
  Filled 2022-05-26: qty 20

## 2022-05-26 MED ORDER — IOHEXOL 300 MG/ML  SOLN
80.0000 mL | Freq: Once | INTRAMUSCULAR | Status: AC | PRN
  Administered 2022-05-26: 80 mL via INTRAVENOUS

## 2022-05-26 MED ORDER — HYDROMORPHONE HCL 1 MG/ML IJ SOLN
1.0000 mg | Freq: Once | INTRAMUSCULAR | Status: AC
  Administered 2022-05-26: 1 mg via INTRAVENOUS
  Filled 2022-05-26: qty 1

## 2022-05-26 MED ORDER — SODIUM CHLORIDE 0.9 % IV BOLUS: 500.0000 mL | Freq: Once | INTRAVENOUS | Status: DC

## 2022-05-26 MED ORDER — SODIUM CHLORIDE (PF) 0.9 % IJ SOLN
INTRAMUSCULAR | Status: AC
  Filled 2022-05-26: qty 50

## 2022-05-26 MED ORDER — ONDANSETRON HCL 4 MG/2ML IJ SOLN
4.0000 mg | Freq: Once | INTRAMUSCULAR | Status: AC
  Administered 2022-05-26: 4 mg via INTRAVENOUS
  Filled 2022-05-26: qty 2

## 2022-05-26 MED ORDER — SODIUM CHLORIDE 0.9 % IV SOLN: 2.0000 g | Freq: Once | INTRAVENOUS | Status: DC

## 2022-05-26 NOTE — ED Notes (Signed)
Pt and visitor requesting to leave prior to receiving dc papers and they have reviewed results in mychart

## 2022-05-26 NOTE — ED Notes (Signed)
Visitor to desk requesting to see provider, this RN spoke with pt and visitor who states they would like and update and are upset about wait time, advised will let provider know and apologized for delay, EDP aware

## 2022-05-26 NOTE — ED Triage Notes (Signed)
Pt reporting generalized abdominal pain for about 6 hours, "dull" type pain. Nausea and vomiting. Last BM today, reports as normal. Denies fevers or difficulty urinating. Had paracentesis last week.

## 2022-05-26 NOTE — Procedures (Signed)
PROCEDURE SUMMARY:  Successful ultrasound guided paracentesis from the right lower quadrant.  Yielded 3.1 liters of turbid, yellow fluid.  No immediate complications.  The patient tolerated the procedure well.   Specimen was sent for labs.  EBL < 2 cc   The patient has previously been formally evaluated by the [facility] and is being actively followed for potential future intervention. ** Patient currently followed by [facility] for possible transplant and TIPS. Next follow up with [facility] is in November per patient. Lennette Bihari Jensen Cheramie,PA-C

## 2022-05-26 NOTE — ED Provider Notes (Cosign Needed Addendum)
[facility] DEPT Provider Note   CSN: [id number] Arrival date & time: 05/26/22  0101     History  Chief Complaint  Patient presents with   Abdominal Pain    Andrew Patton is a 61 y.o. male.  HPI   Patient with medical history including hypertension, cirrhosis secondary due to alcohol use, ascites weekly paracentesis presents with complaints of abdominal pain, states this started this morning around midnight, remains in the umbilical region does not radiate remains constant, associated nausea and vomiting still passing gas having normal bowel movements last bowel was today, still making urine, states she has had chills but no fevers no cough no congestion no other complaints.  Reviewed patient's chart gets weekly paracentesis, her most recently performed on the 21st, has history of umbilical hernia which will be managed medically.  Had EdG performed earlier this year negative for evidence of varices.    Home Medications Prior to Admission medications   Medication Sig Start Date End Date Taking? Authorizing Provider  busPIRone (BUSPAR) 15 MG tablet Take 7.5-15 mg by mouth 2 (two) times daily. Take 1/2 tablet (7.5 mg) in the morning and Take 1 tablet (15 mg) at supper    [provider]  clonazePAM (KLONOPIN) 0.5 MG tablet Take 0.5 mg by mouth at bedtime.    [provider]  colesevelam (WELCHOL) 625 MG tablet Take 625 mg by mouth daily with breakfast.    [provider]  escitalopram (LEXAPRO) 5 MG tablet Take 5 mg by mouth daily. 02/14/22   [provider]  eszopiclone (LUNESTA) 2 MG TABS tablet Take 2 mg by mouth at bedtime. Take immediately before bedtime    [provider]  ezetimibe (ZETIA) 10 MG tablet Take 10 mg by mouth daily.    [provider]  fluticasone (FLONASE) 50 MCG/ACT nasal spray Place 1 spray into both nostrils at bedtime. 01/05/13   [provider]  folic acid (FOLVITE) 1  MG tablet Take 1 mg by mouth at bedtime.    [provider]  furosemide (LASIX) 80 MG tablet Take 1 tablet (80 mg total) by mouth daily. 05/17/22   Cirigliano, Vito V, DO  ipratropium (ATROVENT) 0.06 % nasal spray Place 1 spray into the nose daily. 02/17/19   [provider]  lactulose (CHRONULAC) 10 GM/15ML solution Take 15 mLs (10 g total) by mouth 2 (two) times daily as needed for mild constipation (for bowel movments 2-3 per day). 07/06/21   Pokhrel, Corrie Mckusick, MD  levETIRAcetam (KEPPRA XR) 500 MG 24 hr tablet Take 2 tablets (1,000 mg total) by mouth daily. 12/10/21   Melvenia Beam, MD  levothyroxine (SYNTHROID) 88 MCG tablet Take 88 mcg by mouth every morning. 02/14/22   [provider]  ondansetron (ZOFRAN ODT) 4 MG disintegrating tablet Take 1 tablet (4 mg total) by mouth every 8 (eight) hours as needed for nausea or vomiting. Patient taking differently: Take 4 mg by mouth every 8 (eight) hours as needed for nausea or vomiting (dissolve orally). 02/08/20   Volanda Napoleon, PA-C  pantoprazole (PROTONIX) 20 MG tablet Take 1 tablet (20 mg total) by mouth 2 (two) times daily. Then take 1 tablet by mouth daily Patient taking differently: Take 20 mg by mouth daily. Then take 1 tablet by mouth daily 12/08/21   Cirigliano, Vito V, DO  rifaximin (XIFAXAN) 550 MG TABS tablet Take 1 tablet (550 mg total) by mouth 2 (two) times daily. 12/15/21   Cirigliano, [employer]  V, DO  spironolactone (ALDACTONE) 100 MG tablet Take 1 tablet (100 mg total) by mouth daily. Take 1 tablet with 50 mg tablet (150 mg total) by mouth daily. Patient taking differently: Take 200 mg by mouth daily. 12/08/21   Cirigliano, Vito V, DO  spironolactone (ALDACTONE) 50 MG tablet Take 1 tablet (50 mg total) by mouth daily. Take 1 tablet with 100 mg tablet (150 mg total) by mouth daily. 12/08/21   Cirigliano, Vito V, DO  thiamine 100 MG tablet Take 1 tablet (100 mg total) by mouth daily. 07/06/21   Pokhrel, Rebekah Chesterfield, MD  traMADol  (ULTRAM) 50 MG tablet Take 1 tablet (50 mg total) by mouth at bedtime. 02/22/22   Leatha Gilding, MD      Allergies    Crestor [rosuvastatin calcium], Meloxicam, Rosuvastatin, and Penicillins    Review of Systems   Review of Systems  Constitutional:  Negative for chills and fever.  Respiratory:  Negative for shortness of breath.   Cardiovascular:  Negative for chest pain.  Gastrointestinal:  Positive for abdominal pain, nausea and vomiting.  Neurological:  Negative for headaches.    Physical Exam Updated Vital Signs BP 122/63   Pulse 85   Temp 98.5 F (36.9 C) (Oral)   Resp 18 Comment: Simultaneous filing. User may not have seen previous data.  SpO2 100%  Physical Exam Vitals and nursing note reviewed.  Constitutional:      General: He is in acute distress.     Appearance: He is ill-appearing.     Comments: Chronically ill-appearing showing acute signs of distress.  HENT:     Head: Normocephalic and atraumatic.     Nose: No congestion.  Eyes:     General: No scleral icterus.    Conjunctiva/sclera: Conjunctivae normal.  Cardiovascular:     Rate and Rhythm: Normal rate and regular rhythm.     Pulses: Normal pulses.     Heart sounds: No murmur heard.    No friction rub. No gallop.  Pulmonary:     Effort: No respiratory distress.     Breath sounds: No wheezing, rhonchi or rales.  Abdominal:     General: There is distension.     Palpations: Abdomen is soft.     Tenderness: There is abdominal tenderness. There is no right CVA tenderness or left CVA tenderness.     Comments: Abdomen slightly distended but soft, umbilical hernia present easily reducible, patient has notable tenderness around the umbilical region without guarding rebound or peritoneal sign negative Murphy sign McBurney point.  Musculoskeletal:     Right lower leg: No edema.     Left lower leg: No edema.  Skin:    General: Skin is warm and dry.     Coloration: Skin is jaundiced.     Comments: Has a  slight yellowish tint.  Neurological:     Mental Status: He is alert.  Psychiatric:        Mood and Affect: Mood normal.     ED Results / Procedures / Treatments   Labs (all labs ordered are listed, but only abnormal results are displayed) Labs Reviewed  COMPREHENSIVE METABOLIC PANEL - Abnormal; Notable for the following components:      Result Value   Sodium 125 (*)    Chloride 96 (*)    CO2 18 (*)    Glucose, Bld 124 (*)    Creatinine, Ser 1.54 (*)    Calcium 8.5 (*)    Total Protein 5.9 (*)  Albumin 3.3 (*)    Total Bilirubin 4.9 (*)    GFR, Estimated 51 (*)    All other components within normal limits  CBC - Abnormal; Notable for the following components:   WBC 13.6 (*)    RBC 3.18 (*)    Hemoglobin 10.9 (*)    HCT 30.0 (*)    MCH 34.3 (*)    MCHC 36.3 (*)    All other components within normal limits  LIPASE, BLOOD  URINALYSIS, ROUTINE W REFLEX MICROSCOPIC  AMMONIA    EKG None  Radiology No results found.  Procedures Procedures    Medications Ordered in ED Medications  HYDROmorphone (DILAUDID) injection 1 mg (1 mg Intravenous Given 05/26/22 0627)  ondansetron (ZOFRAN) injection 4 mg (4 mg Intravenous Given 05/26/22 0627)  sodium chloride 0.9 % bolus 500 mL (500 mLs Intravenous Bolus 05/26/22 0633)  sodium chloride (PF) 0.9 % injection (  Given by Other 05/26/22 9024)    ED Course/ Medical Decision Making/ A&P                           Medical Decision Making Amount and/or Complexity of Data Reviewed Labs: ordered. Radiology: ordered.  Risk Prescription drug management.   This patient presents to the ED for concern of abdominal pain, this involves an extensive number of treatment options, and is a complaint that carries with it a high risk of complications and morbidity.  The differential diagnosis includes SBP, bowel obstruction, diverticulitis, pancreatitis    Additional history obtained:  Additional history obtained from partner at  bedside External records from outside source obtained and reviewed including GI notes   Co morbidities that complicate the patient evaluation  Cirrhosis  Social Determinants of Health:  N/A    Lab Tests:  I Ordered, and personally interpreted labs.  The pertinent results include: CBC shows leukocytosis of 13.6, normocytic anemia, CMP shows sodium of 125, CO2 of 18 glucose 124 creatinine 1.5 elevated T. bili GFR 51 lipase 43   Imaging Studies ordered:  I ordered imaging studies including CT AP I independently visualized and interpreted imaging which showed pending I agree with the radiologist interpretation   Cardiac Monitoring:  The patient was maintained on a cardiac monitor.  I personally viewed and interpreted the cardiac monitored which showed an underlying rhythm of: N/A   Medicines ordered and prescription drug management:  I ordered medication including fluids, antiemetics, pain medication I have reviewed the patients home medicines and have made adjustments as needed  Critical Interventions:  N/A   Reevaluation:  Present with abdominal pain we will proceed with CT AP add on ammonia levels and reassess  Consultations Obtained:  N/A    Test Considered:  N/A    Rule out I have low suspicion for liver or gallbladder abnormality as he has no right upper quadrant tenderness, liver , alk phos within normal limits.  Does have an elevated T. bili this appears to be his baseline suspect secondary due to liver cirrhosis.  Low suspicion for pancreatitis as lipase is within normal limits.  Low suspicion for ruptured stomach ulcer as she has no peritoneal sign present on exam.  Low suspicion for bowel obstruction  normal bowel sounds, so passing gas and having normal bowel movements.  Low suspicion for complicated diverticulitis as she is nontoxic-appearing, vital signs reassuring no leukocytosis present.  Doubt incarcerated/strangulated hernia as hernia is easily  reducible on my exam.     Dispostion  and problem list  Due to shift change patient be handed off to [facility] [place] same  Follow-up on CT AP as well as ammonia levels, if both are unremarkable and continues to have significant pain I would recommend paracentesis to rule out SBP as he does have an elevated white count.             Final Clinical Impression(s) / ED Diagnoses Final diagnoses:  Periumbilical abdominal pain    Rx / DC Orders ED Discharge Orders     None         Marcello Fennel, PA-C 05/26/22 0654    Marcello Fennel, PA-C 05/26/22 [phone]

## 2022-05-26 NOTE — ED Notes (Signed)
Pt assisted to vehicle by wheelchair

## 2022-05-26 NOTE — ED Provider Notes (Signed)
  Physical Exam  BP 124/68 (BP Location: Left Arm)   Pulse 92   Temp 98.3 F (36.8 C) (Oral)   Resp 14   SpO2 97%   Physical Exam Vitals and nursing note reviewed.  Constitutional:      Appearance: Normal appearance. He is not toxic-appearing.  Eyes:     General: No scleral icterus. Pulmonary:     Effort: Pulmonary effort is normal. No respiratory distress.  Abdominal:     General: There is distension.     Tenderness: There is generalized abdominal tenderness.     Hernia: A hernia is present. Hernia is present in the umbilical area.     Comments: Abdomen distended however is pliable.  No overlying skin changes noted.  He does have a soft.  Umbilical hernia that is reducible.  Normal active bowel sounds.  Skin:    General: Skin is dry.     Findings: No rash.  Neurological:     Mental Status: He is alert. Mental status is at baseline.  Psychiatric:        Mood and Affect: Mood normal.     Procedures  Procedures  ED Course / MDM   Clinical Course as of 05/26/22 1520  Wed May 26, 2022  1514 Cirrhosis alco [facility] Weekly paracentesis on Thursday.  F/u on paracentesis  [CR]    Clinical Course User Index [CR] Wilnette Kales, PA   Medical Decision Making Amount and/or Complexity of Data Reviewed Labs: ordered. Radiology: ordered.  Risk Prescription drug management.   Accepted handoff at shift change from [employer], [place]. Please see prior provider note for more detail.   Briefly: Patient is 61 y.o.M with history of cirrhosis secondary to alcohol consumption.  Sees [facility].  He has weekly paracentesis scheduled with his GI providers.  Usually gets them on Thursdays.  Patient was having increased abdominal pain with nausea and vomiting, no changes to his bowel, still passing gas since yesterday.  He returned to the emergency department.  DDX: concern for intra-abdominal etiology versus ascites versus SBP versus therapeutic paracentesis  Plan: Given patient's  elevated white blood cell count, we will follow-up on CT imaging and remaining labs.  If these are normal, can proceed with IR paracentesis for SBP rule out.  Discussed plan for paracentesis with patient and feeling member at bedside.  I spoke and called the interventional radiology about the paracentesis for pain relief and also for SBP testing.  Radiology reports that he is on the list and they will be done later today.  Patient is currently undergoing paracentesis.  3:22 PM Care of [facility] transferred to [employer] at the end of my shift as the patient will require reassessment once labs/imaging have resulted. Patient presentation, ED course, and plan of care discussed with review of all pertinent labs and imaging. Please see his/her note for further details regarding further ED course and disposition. Plan at time of handoff is follow up on labs and imaging to r/o SBP. This may be altered or completely changed at the discretion of the oncoming team pending results of further workup.     Sherrell Puller, PA-C 05/26/22 1523    Regan Lemming, MD 05/26/22 1859

## 2022-05-26 NOTE — ED Triage Notes (Signed)
Pt arrives via [facility] from home. Per medic report, C/o abdominal pain and vomiting for about 6 hour. Hx of hernia/cirrhosis. Has had paracentesis prior. En route 128/60, hr 92, 16rr, 98%, cbg 145. IV established in the left [facility], has had 4 of zofran.

## 2022-05-26 NOTE — ED Notes (Signed)
Patient transported to CT 

## 2022-05-27 ENCOUNTER — Telehealth: Payer: Self-pay

## 2022-05-27 ENCOUNTER — Inpatient Hospital Stay (HOSPITAL_COMMUNITY)
Admission: EM | Admit: 2022-05-27 | Discharge: 2022-06-01 | DRG: 371 | Disposition: A | Discharge status: Home / Self Care | Payer: Medicare Other | Attending: Family Medicine | Admitting: Family Medicine

## 2022-05-27 ENCOUNTER — Encounter (HOSPITAL_COMMUNITY): Payer: Self-pay

## 2022-05-27 ENCOUNTER — Other Ambulatory Visit: Payer: Self-pay

## 2022-05-27 DIAGNOSIS — K7031 Alcoholic cirrhosis of liver with ascites: Secondary | ICD-10-CM | POA: Diagnosis present

## 2022-05-27 DIAGNOSIS — G40909 Epilepsy, unspecified, not intractable, without status epilepticus: Secondary | ICD-10-CM | POA: Diagnosis present

## 2022-05-27 DIAGNOSIS — E8809 Other disorders of plasma-protein metabolism, not elsewhere classified: Secondary | ICD-10-CM | POA: Diagnosis present

## 2022-05-27 DIAGNOSIS — E785 Hyperlipidemia, unspecified: Secondary | ICD-10-CM | POA: Diagnosis present

## 2022-05-27 DIAGNOSIS — D684 Acquired coagulation factor deficiency: Secondary | ICD-10-CM | POA: Diagnosis present

## 2022-05-27 DIAGNOSIS — Z87898 Personal history of other specified conditions: Secondary | ICD-10-CM | POA: Diagnosis not present

## 2022-05-27 DIAGNOSIS — Z79899 Other long term (current) drug therapy: Secondary | ICD-10-CM

## 2022-05-27 DIAGNOSIS — Z8052 Family history of malignant neoplasm of bladder: Secondary | ICD-10-CM

## 2022-05-27 DIAGNOSIS — K652 Spontaneous bacterial peritonitis: Principal | ICD-10-CM | POA: Diagnosis present

## 2022-05-27 DIAGNOSIS — Z880 Allergy status to penicillin: Secondary | ICD-10-CM | POA: Diagnosis not present

## 2022-05-27 DIAGNOSIS — K429 Umbilical hernia without obstruction or gangrene: Secondary | ICD-10-CM | POA: Diagnosis present

## 2022-05-27 DIAGNOSIS — F1011 Alcohol abuse, in remission: Secondary | ICD-10-CM | POA: Diagnosis present

## 2022-05-27 DIAGNOSIS — E039 Hypothyroidism, unspecified: Secondary | ICD-10-CM | POA: Diagnosis present

## 2022-05-27 DIAGNOSIS — K2100 Gastro-esophageal reflux disease with esophagitis, without bleeding: Secondary | ICD-10-CM | POA: Diagnosis present

## 2022-05-27 DIAGNOSIS — K760 Fatty (change of) liver, not elsewhere classified: Secondary | ICD-10-CM | POA: Diagnosis present

## 2022-05-27 DIAGNOSIS — K2970 Gastritis, unspecified, without bleeding: Secondary | ICD-10-CM

## 2022-05-27 DIAGNOSIS — F418 Other specified anxiety disorders: Secondary | ICD-10-CM | POA: Diagnosis present

## 2022-05-27 DIAGNOSIS — E871 Hypo-osmolality and hyponatremia: Secondary | ICD-10-CM | POA: Diagnosis present

## 2022-05-27 DIAGNOSIS — Z888 Allergy status to other drugs, medicaments and biological substances status: Secondary | ICD-10-CM

## 2022-05-27 DIAGNOSIS — Z87891 Personal history of nicotine dependence: Secondary | ICD-10-CM

## 2022-05-27 DIAGNOSIS — I10 Essential (primary) hypertension: Secondary | ICD-10-CM | POA: Diagnosis present

## 2022-05-27 DIAGNOSIS — F32A Depression, unspecified: Secondary | ICD-10-CM | POA: Diagnosis present

## 2022-05-27 DIAGNOSIS — F419 Anxiety disorder, unspecified: Secondary | ICD-10-CM | POA: Diagnosis present

## 2022-05-27 DIAGNOSIS — N179 Acute kidney failure, unspecified: Secondary | ICD-10-CM | POA: Diagnosis present

## 2022-05-27 DIAGNOSIS — R54 Age-related physical debility: Secondary | ICD-10-CM | POA: Diagnosis present

## 2022-05-27 DIAGNOSIS — E119 Type 2 diabetes mellitus without complications: Secondary | ICD-10-CM

## 2022-05-27 DIAGNOSIS — Z79890 Hormone replacement therapy (postmenopausal): Secondary | ICD-10-CM

## 2022-05-27 DIAGNOSIS — Z83438 Family history of other disorder of lipoprotein metabolism and other lipidemia: Secondary | ICD-10-CM

## 2022-05-27 DIAGNOSIS — Z803 Family history of malignant neoplasm of breast: Secondary | ICD-10-CM

## 2022-05-27 DIAGNOSIS — K767 Hepatorenal syndrome: Secondary | ICD-10-CM | POA: Diagnosis present

## 2022-05-27 DIAGNOSIS — D649 Anemia, unspecified: Secondary | ICD-10-CM | POA: Diagnosis present

## 2022-05-27 DIAGNOSIS — Z820 Family history of epilepsy and other diseases of the nervous system: Secondary | ICD-10-CM

## 2022-05-27 LAB — COMPREHENSIVE METABOLIC PANEL
ALT: 16 U/L (ref 0–44)
AST: 33 U/L (ref 15–41)
Albumin: 3.1 g/dL — ABNORMAL LOW (ref 3.5–5.0)
Alkaline Phosphatase: 75 U/L (ref 38–126)
Anion gap: 10 (ref 5–15)
BUN: 28 mg/dL — ABNORMAL HIGH (ref 6–20)
CO2: 23 mmol/L (ref 22–32)
Calcium: 8.7 mg/dL — ABNORMAL LOW (ref 8.9–10.3)
Chloride: 91 mmol/L — ABNORMAL LOW (ref 98–111)
Creatinine, Ser: 1.86 mg/dL — ABNORMAL HIGH (ref 0.61–1.24)
GFR, Estimated: 41 mL/min — ABNORMAL LOW (ref >60)
Glucose, Bld: 120 mg/dL — ABNORMAL HIGH (ref 70–99)
Potassium: 3.9 mmol/L (ref 3.5–5.1)
Sodium: 124 mmol/L — ABNORMAL LOW (ref 135–145)
Total Bilirubin: 5.2 mg/dL — ABNORMAL HIGH (ref 0.3–1.2)
Total Protein: 5.7 g/dL — ABNORMAL LOW (ref 6.5–8.1)

## 2022-05-27 LAB — CBC WITH DIFFERENTIAL/PLATELET
Abs Immature Granulocytes: 0.08 10*3/uL — ABNORMAL HIGH (ref 0.00–0.07)
Basophils Absolute: 0 10*3/uL (ref 0.0–0.1)
Basophils Relative: 0 %
Eosinophils Absolute: 0.1 10*3/uL (ref 0.0–0.5)
Eosinophils Relative: 1 %
HCT: 28.3 % — ABNORMAL LOW (ref 39.0–52.0)
Hemoglobin: 10.4 g/dL — ABNORMAL LOW (ref 13.0–17.0)
Immature Granulocytes: 1 %
Lymphocytes Relative: 7 %
Lymphs Abs: 0.8 10*3/uL (ref 0.7–4.0)
MCH: 34.4 pg — ABNORMAL HIGH (ref 26.0–34.0)
MCHC: 36.7 g/dL — ABNORMAL HIGH (ref 30.0–36.0)
MCV: 93.7 fL (ref 80.0–100.0)
Monocytes Absolute: 2 10*3/uL — ABNORMAL HIGH (ref 0.1–1.0)
Monocytes Relative: 18 %
Neutro Abs: 8.2 10*3/uL — ABNORMAL HIGH (ref 1.7–7.7)
Neutrophils Relative %: 73 %
Platelets: 221 10*3/uL (ref 150–400)
RBC: 3.02 MIL/uL — ABNORMAL LOW (ref 4.22–5.81)
RDW: 12.9 % (ref 11.5–15.5)
WBC: 11.2 10*3/uL — ABNORMAL HIGH (ref 4.0–10.5)
nRBC: 0 % (ref 0.0–0.2)

## 2022-05-27 LAB — PROTIME-INR
INR: 1.7 — ABNORMAL HIGH (ref 0.8–1.2)
Prothrombin Time: 20.2 seconds — ABNORMAL HIGH (ref 11.4–15.2)

## 2022-05-27 LAB — LIPASE, BLOOD: Lipase: 46 U/L (ref 11–51)

## 2022-05-27 LAB — AMMONIA: Ammonia: 64 umol/L — ABNORMAL HIGH (ref 9–35)

## 2022-05-27 LAB — LACTIC ACID, PLASMA
Lactic Acid, Venous: 2.8 mmol/L (ref 0.5–1.9)
Lactic Acid, Venous: 2.4 mmol/L (ref 0.5–1.9)

## 2022-05-27 LAB — PATHOLOGIST SMEAR REVIEW

## 2022-05-27 MED ORDER — HEPARIN SODIUM (PORCINE) 5000 UNIT/ML IJ SOLN
5000.0000 [IU] | Freq: Three times a day (TID) | INTRAMUSCULAR | Status: DC
  Administered 2022-05-27 – 2022-06-01 (×15): 5000 [IU] via SUBCUTANEOUS
  Filled 2022-05-27 (×15): qty 1

## 2022-05-27 MED ORDER — MIDODRINE HCL 5 MG PO TABS
7.5000 mg | ORAL_TABLET | Freq: Three times a day (TID) | ORAL | Status: DC
  Administered 2022-05-27 – 2022-05-28 (×2): 7.5 mg via ORAL
  Filled 2022-05-27 (×2): qty 2

## 2022-05-27 MED ORDER — BUSPIRONE HCL 15 MG PO TABS: 7.5000 mg | ORAL_TABLET | Freq: Two times a day (BID) | ORAL | Status: DC

## 2022-05-27 MED ORDER — ALBUMIN HUMAN 25 % IV SOLN
66.0000 g | Freq: Every day | INTRAVENOUS | Status: AC
  Administered 2022-05-27: 68.75 g via INTRAVENOUS
  Administered 2022-05-28: 12.5 g via INTRAVENOUS
  Filled 2022-05-27: qty 280

## 2022-05-27 MED ORDER — THIAMINE HCL 100 MG PO TABS
100.0000 mg | ORAL_TABLET | Freq: Every day | ORAL | Status: DC
  Administered 2022-05-28 – 2022-06-01 (×5): 100 mg via ORAL
  Filled 2022-05-27 (×9): qty 1

## 2022-05-27 MED ORDER — ONDANSETRON HCL 4 MG PO TABS: 4.0000 mg | ORAL_TABLET | Freq: Four times a day (QID) | ORAL | Status: DC | PRN

## 2022-05-27 MED ORDER — ZOLPIDEM TARTRATE 5 MG PO TABS: 5.0000 mg | ORAL_TABLET | Freq: Every evening | ORAL | Status: DC | PRN

## 2022-05-27 MED ORDER — LEVOTHYROXINE SODIUM 88 MCG PO TABS
88.0000 ug | ORAL_TABLET | Freq: Every day | ORAL | Status: DC
  Administered 2022-05-28 – 2022-06-01 (×5): 88 ug via ORAL
  Filled 2022-05-27 (×6): qty 1

## 2022-05-27 MED ORDER — RIFAXIMIN 550 MG PO TABS
550.0000 mg | ORAL_TABLET | Freq: Two times a day (BID) | ORAL | Status: DC
  Administered 2022-05-27 – 2022-06-01 (×10): 550 mg via ORAL
  Filled 2022-05-27 (×12): qty 1

## 2022-05-27 MED ORDER — ONDANSETRON 4 MG PO TBDP
8.0000 mg | ORAL_TABLET | Freq: Once | ORAL | Status: AC
  Administered 2022-05-27: 8 mg via ORAL
  Filled 2022-05-27: qty 2

## 2022-05-27 MED ORDER — SODIUM CHLORIDE 0.9 % IV SOLN
2.0000 g | Freq: Once | INTRAVENOUS | Status: AC
  Administered 2022-05-27: 2 g via INTRAVENOUS
  Filled 2022-05-27: qty 20

## 2022-05-27 MED ORDER — SODIUM CHLORIDE 0.9 % IV SOLN
2.0000 g | INTRAVENOUS | Status: DC
  Administered 2022-05-28 – 2022-05-31 (×4): 2 g via INTRAVENOUS
  Filled 2022-05-27 (×4): qty 20

## 2022-05-27 MED ORDER — COLESEVELAM HCL 625 MG PO TABS
625.0000 mg | ORAL_TABLET | Freq: Every day | ORAL | Status: DC
  Administered 2022-05-28 – 2022-06-01 (×5): 625 mg via ORAL
  Filled 2022-05-27 (×7): qty 1

## 2022-05-27 MED ORDER — SODIUM CHLORIDE 0.9% FLUSH
3.0000 mL | Freq: Two times a day (BID) | INTRAVENOUS | Status: DC
  Administered 2022-05-28 – 2022-06-01 (×7): 3 mL via INTRAVENOUS

## 2022-05-27 MED ORDER — HYDROMORPHONE HCL 1 MG/ML IJ SOLN
0.5000 mg | Freq: Once | INTRAMUSCULAR | Status: AC
  Administered 2022-05-27: 0.5 mg via INTRAMUSCULAR
  Filled 2022-05-27: qty 1

## 2022-05-27 MED ORDER — LACTULOSE 10 GM/15ML PO SOLN: 10.0000 g | Freq: Two times a day (BID) | ORAL | Status: DC | PRN

## 2022-05-27 MED ORDER — ALBUMIN HUMAN 25 % IV SOLN: 66.0000 g | Freq: Once | INTRAVENOUS | Status: DC

## 2022-05-27 MED ORDER — LEVETIRACETAM ER 500 MG PO TB24
1000.0000 mg | ORAL_TABLET | Freq: Every day | ORAL | Status: DC
  Administered 2022-05-27 – 2022-05-31 (×5): 1000 mg via ORAL
  Filled 2022-05-27 (×7): qty 2

## 2022-05-27 MED ORDER — HYDROMORPHONE HCL 1 MG/ML IJ SOLN
0.5000 mg | Freq: Once | INTRAMUSCULAR | Status: AC
  Administered 2022-05-27: 0.5 mg via INTRAVENOUS
  Filled 2022-05-27: qty 1

## 2022-05-27 MED ORDER — BUSPIRONE HCL 5 MG PO TABS
7.5000 mg | ORAL_TABLET | Freq: Every morning | ORAL | Status: DC
  Administered 2022-05-28 – 2022-06-01 (×5): 7.5 mg via ORAL
  Filled 2022-05-27: qty 1
  Filled 2022-05-27 (×5): qty 2

## 2022-05-27 MED ORDER — SODIUM CHLORIDE 0.9 % IV SOLN
50.0000 ug/h | INTRAVENOUS | Status: DC
  Administered 2022-05-27 – 2022-06-01 (×8): 50 ug/h via INTRAVENOUS
  Filled 2022-05-27 (×17): qty 1

## 2022-05-27 MED ORDER — ALBUMIN HUMAN 25 % IV SOLN
100.0000 g | Freq: Once | INTRAVENOUS | Status: DC
  Filled 2022-05-27: qty 400

## 2022-05-27 MED ORDER — EZETIMIBE 10 MG PO TABS
10.0000 mg | ORAL_TABLET | Freq: Every day | ORAL | Status: DC
  Administered 2022-05-28 – 2022-06-01 (×5): 10 mg via ORAL
  Filled 2022-05-27 (×5): qty 1

## 2022-05-27 MED ORDER — PANTOPRAZOLE SODIUM 20 MG PO TBEC
20.0000 mg | DELAYED_RELEASE_TABLET | Freq: Every day | ORAL | Status: DC
  Administered 2022-05-28 – 2022-06-01 (×5): 20 mg via ORAL
  Filled 2022-05-27 (×5): qty 1

## 2022-05-27 MED ORDER — FOLIC ACID 1 MG PO TABS
1.0000 mg | ORAL_TABLET | Freq: Every day | ORAL | Status: DC
  Administered 2022-05-27 – 2022-05-31 (×5): 1 mg via ORAL
  Filled 2022-05-27 (×5): qty 1

## 2022-05-27 MED ORDER — CLONAZEPAM 0.5 MG PO TABS
0.5000 mg | ORAL_TABLET | Freq: Every day | ORAL | Status: DC
  Administered 2022-05-27 – 2022-05-31 (×5): 0.5 mg via ORAL
  Filled 2022-05-27 (×5): qty 1

## 2022-05-27 MED ORDER — BUSPIRONE HCL 5 MG PO TABS
15.0000 mg | ORAL_TABLET | Freq: Every evening | ORAL | Status: DC
  Administered 2022-05-27 – 2022-05-31 (×5): 15 mg via ORAL
  Filled 2022-05-27 (×2): qty 1
  Filled 2022-05-27: qty 2
  Filled 2022-05-27 (×2): qty 1

## 2022-05-27 MED ORDER — INSULIN ASPART 100 UNIT/ML IJ SOLN
0.0000 [IU] | Freq: Three times a day (TID) | INTRAMUSCULAR | Status: DC
  Administered 2022-05-28 – 2022-05-30 (×3): 1 [IU] via SUBCUTANEOUS
  Administered 2022-05-30: 2 [IU] via SUBCUTANEOUS

## 2022-05-27 MED ORDER — ONDANSETRON HCL 4 MG/2ML IJ SOLN: 4.0000 mg | Freq: Four times a day (QID) | INTRAMUSCULAR | Status: DC | PRN

## 2022-05-27 NOTE — Assessment & Plan Note (Signed)
Secondary to hepatic cirrhosis.  Sodium slightly decreased from recent baseline at 124. -Holding diuretics -Continue ceftriaxone, midodrine, octreotide, and albumin as above

## 2022-05-27 NOTE — Hospital Course (Signed)
Andrew Patton is a 61 y.o. male with medical history significant for decompensated hepatic cirrhosis (secondary to alcohol abstinent since 3606) complicated by portal hypertensive gastropathy and recurrent ascites requiring weekly paracentesis, chronic hyponatremia, T2DM, seizure disorder, HLD, hypothyroidism, umbilical hernia who is admitted with SBP.

## 2022-05-27 NOTE — Assessment & Plan Note (Signed)
Paracentesis performed 10/25 shows PMNs >5500.  No organisms seen on Gram stain, culture in process.  WBC is 11.2.  Has AKI with creatinine up to 1.86.  T. bili increased to 5.2 from baseline of ~3.5.  Concern is he may also be developing hepatorenal syndrome. -Continue IV ceftriaxone 2 g daily -Give IV albumin 1 g/kg x 2 days -Start midodrine 7.5 mg q8h -Start octreotide infusion 50 mcg/hour

## 2022-05-27 NOTE — ED Provider Triage Note (Signed)
Emergency Medicine Provider Triage Evaluation Note  Andrew Patton , a 61 y.o. male  was evaluated in triage.  Pt complains of decompensated cirrhosis and SBP.  He went to [facility] ED yesterday due to abdominal pain, CT negative for acute process.  He had paracentesis done which was concerning for SBP.  Patient had left AGAINST MEDICAL ADVICE prior to the results secondary to frustration with care.  Patient's wife states she has been more confused than normal, some low-grade fevers at home.  He is not having nausea and vomiting.  Fluid Type-FCT  CYTO PERI  PERITONEAL VC, CM  PERITONEAL VC, CM  PERITONEAL VC, CM  PERITONEAL VC, CM  PERITONEAL FL, ABD VC, CM  ABDOMEN   Color, Fluid YELLOW YELLOW  YELLOW  YELLOW R  YELLOW Abnormal   YELLOW R  YELLOW R  STRAW Abnormal    Appearance, Fluid CLEAR CLOUDY Abnormal   HAZY Abnormal   HAZY Abnormal   HAZY Abnormal   HAZY Abnormal   HAZY Abnormal   CLOUDY Abnormal    Total Nucleated Cell Count, Fluid 0 - 1,000 cu mm 5,511 High   130  200  168  141  78  104   Neutrophil Count, Fluid 0 - 25 % 70 High   2  2  2  3  3  6    Lymphs, Fluid % 2  63  55  65  58  21  69   Monocyte-Macrophage-Serous Fluid 50 - 90 % 28 Low   35 Low   43 Low   33 Low   39 Low   76  25 Low    Eos, Fluid % 0  0  0 CM  0 CM  0 CM  0  0 CM   Comment: Performed at [facility], [place] [address] [place], [place] [address]  Other Cells, Fluid   OTHER CELLS IDENTIFIED AS MESOTHELIAL CELLS R, CM     OTHER CELLS IDENTIFIED AS MESOTHELIAL CELLS R, CM    Resulting Agency  [facility]          Review of Systems  Per HPI  Physical Exam  BP (!) 103/56 (BP Location: Left Arm)   Pulse 79   Temp 99.6 F (37.6 C) (Oral)   Resp 20   Ht 5\' 6"  (1.676 m)   Wt 66.2 kg   SpO2 100%   BMI 23.57 kg/m  Gen:   Awake, no distress. Ill appearing  Resp:  Normal effort  MSK:   Moves extremities without difficulty  Other:  Soft reducible helical hernia.  Abdomen somewhat  distended.  Patient is jaundiced  Medical Decision Making  Medically screening exam initiated at 4:05 PM.  Appropriate orders placed.  Thressa Sheller was informed that the remainder of the evaluation will be completed by another provider, this initial triage assessment does not replace that evaluation, and the importance of remaining in the ED until their evaluation is complete.     Sherrill Raring, PA-C 05/27/22 1607

## 2022-05-27 NOTE — Assessment & Plan Note (Signed)
Continue Keppra.

## 2022-05-27 NOTE — Telephone Encounter (Signed)
Pt's wife stated that pt was having severe pain abd pain and they went to ER at [facility]. Pt had a paracentesis and then left due to the long wait. They then received a call from a PA stating that pt had bacteria in the fluid and the PA requested that he come back to the hospital to receive IV antibiotics. Pt is concerned about having to wait a long time in the waiting room and doesn't want to go back to [facility] hospital. Let pt's wife know that pt could go to [facility] ER and that pt will likely need to be admitted. Pt's wife verbalized understanding.

## 2022-05-27 NOTE — Assessment & Plan Note (Signed)
Follows with [facility], Dr. Bryan Lemma, and establishing with [facility].  Has been requiring weekly paracentesis.  Now admitted with concern for SBP and hepatorenal syndrome as above.  Na-MELD score is 31 indicating a 52.6% 73-month mortality risk.  He reports abstinence from alcohol since 2022. -Holding Lasix and spironolactone as above -Continue lactulose and rifaximin

## 2022-05-27 NOTE — Assessment & Plan Note (Signed)
Place on SSI. °

## 2022-05-27 NOTE — ED Triage Notes (Addendum)
Pt states he was at [facility] yesterday, had a paracentesis that had bacteria in it. Pt states he was sent here for abx.  Pt states his pain is starting again, as well as nausea.

## 2022-05-27 NOTE — Assessment & Plan Note (Signed)
Continue Synthroid °

## 2022-05-27 NOTE — ED Provider Notes (Signed)
I provided a substantive portion of the care of this patient.  I personally performed the entirety of the medical decision making for this encounter.      61 year old male presents due to worsening abdominal pain.  Seen yesterday at [facility] and diagnosed with SBP.  Was told to come here today for follow-up.  Patient continues to be febrile and had leukocytosis.  Antibiotic started.  Will require admission   Lacretia Leigh, MD 05/27/22 1800

## 2022-05-27 NOTE — H&P (Signed)
History and Physical    Andrew Patton BWG:[id number] DOB: [birth year] DOA: 05/27/2022  PCP: Deland Pretty, MD  Patient coming from: Home  I have personally briefly reviewed patient's old medical records in [place]  Chief Complaint: Abdominal pain  HPI: Andrew Patton is a 61 y.o. male with medical history significant for decompensated hepatic cirrhosis (secondary to alcohol abstinent since 1779) complicated by portal hypertensive gastropathy and recurrent ascites requiring weekly paracentesis, chronic hyponatremia, T2DM, seizure disorder, HLD, hypothyroidism, umbilical hernia who presents to the ED for evaluation of abdominal paracentesis lab results.  Patient has been requiring weekly paracentesis to manage recurrent ascites in setting of decompensated hepatic cirrhosis.  He went to the ED yesterday (10/25) for abdominal pain. CT abdomen/pelvis with contrast was negative for acute findings.  Moderate volume ascites, increased from prior noted.  WBC was 13.6.  Repeat paracentesis was performed with 3.1 L peritoneal fluid removed.  He was called today with results that peritoneal fluid labs are consistent with SBP with PMNs >5500.  He presents back to the ED for further management.  Patient follows locally with [facility], Dr. Bryan Lemma, and is now establishing with [place] [facility].  He is being considered for TIPS as well as following at [facility] for possible umbilical hernia repair.  He says yesterday he was having very sharp abdominal pain associated with nausea and vomiting which prompted him to go to the ED.  He has not had any subjective fevers, chills, diaphoresis.  ED Course  Labs/Imaging on admission: I have personally reviewed following labs and imaging studies.  Initial vitals showed BP 103/56, pulse 79, RR 20, temp 99.6 F, SPO2 100% on room air.  Labs show WBC 11.2, hemoglobin 10.4, platelets 221,000, INR 1.7, sodium 124, potassium 3.9, bicarb 23, BUN 28, creatinine  1.86, serum glucose 120, T. bili 5.2, AST 33, ALT 16, alk phos 75, ammonia 64, lipase 46, lactic acid 2.8.  Blood cultures collected and pending.  Patient was given IV ceftriaxone and the hospitalist service was consulted to admit for further evaluation and management.  Review of Systems: All systems reviewed and are negative except as documented in history of present illness above.   Past Medical History:  Diagnosis Date   Allergy    Anxiety    Arthritis    Ascites    Diabetes (HCC)    Elevated transaminase level    Fatigue    Fatty liver    Hyperlipidemia    Hypertension    "under control", not on any blood pressure medication, has lost 60 lbs   Hypothyroidism    on synthroid   Inguinal muscle strain    Insomnia    Left knee pain    "long time ago"   Multinodular goiter    Obesity    Pneumonia    Psoriasis    Tinnitus     Past Surgical History:  Procedure Laterality Date   APPENDECTOMY  1976   FOOT SURGERY Bilateral 1975   IR PARACENTESIS  11/25/2021   IR PARACENTESIS  12/02/2021   IR PARACENTESIS  12/17/2021   IR PARACENTESIS  01/05/2022   IR PARACENTESIS  02/04/2022   IR PARACENTESIS  02/15/2022   IR PARACENTESIS  03/03/2022   IR PARACENTESIS  03/10/2022   IR PARACENTESIS  03/16/2022   IR PARACENTESIS  03/23/2022   IR PARACENTESIS  03/29/2022   IR PARACENTESIS  04/06/2022   IR PARACENTESIS  04/20/2022   IR PARACENTESIS  05/04/2022   IR PARACENTESIS  05/20/2022   IR RADIOLOGIST EVAL & MGMT  12/17/2021   IR RADIOLOGIST EVAL & MGMT  12/31/2021   NOSE SURGERY     ROTATOR CUFF REPAIR Left    left   SHOULDER SURGERY Right 12/20/2019   [facility]     Social History:  reports that he quit smoking about 28 years ago. His smoking use included cigarettes. He has quit using smokeless tobacco.  His smokeless tobacco use included chew. He reports that he does not currently use alcohol after a past usage of about 2.0 standard drinks of alcohol per week. He reports that he  does not use drugs.  Allergies  Allergen Reactions   Crestor [Rosuvastatin Calcium] Other (See Comments)    Elevated liver enzymes   Meloxicam Other (See Comments)    Leg pain   Rosuvastatin Other (See Comments)    Elevated liver enzymes   Penicillins Rash    Has patient had a PCN reaction causing immediate rash, facial/tongue/throat swelling, SOB or lightheadedness with hypotension: No Has patient had a PCN reaction causing severe rash involving mucus membranes or skin necrosis: No Has patient had a PCN reaction that required hospitalization: No Has patient had a PCN reaction occurring within the last 10 years: No If all of the above answers are "NO", then may proceed with Cephalosporin use. Other reaction(s): rash    Family History  Problem Relation Age of Onset   Breast cancer Mother    Hyperlipidemia Mother    Other Mother        ? form of parkinson's    Parkinsonism Mother    Bladder Cancer Father    Parkinson's disease Father    Memory loss Maternal Grandmother        in her 3s   Birth defects Maternal Grandfather    Birth defects Paternal Grandmother    Colon cancer Neg Hx    Esophageal cancer Neg Hx    Rectal cancer Neg Hx    Stomach cancer Neg Hx    Colon polyps Neg Hx    Dementia Neg Hx    Alzheimer's disease Neg Hx      Prior to Admission medications   Medication Sig Start Date End Date Taking? Authorizing Provider  busPIRone (BUSPAR) 15 MG tablet Take 7.5-15 mg by mouth 2 (two) times daily. Take 1/2 tablet (7.5 mg) in the morning and Take 1 tablet (15 mg) at supper    [provider]  clonazePAM (KLONOPIN) 0.5 MG tablet Take 0.5 mg by mouth at bedtime.    [provider]  colesevelam (WELCHOL) 625 MG tablet Take 625 mg by mouth daily with breakfast.    [provider]  escitalopram (LEXAPRO) 5 MG tablet Take 5 mg by mouth daily. 02/14/22   [provider]  eszopiclone (LUNESTA) 2 MG TABS tablet Take 2 mg by mouth at  bedtime. Take immediately before bedtime    [provider]  ezetimibe (ZETIA) 10 MG tablet Take 10 mg by mouth daily.    [provider]  fluticasone (FLONASE) 50 MCG/ACT nasal spray Place 1 spray into both nostrils at bedtime. 01/05/13   [provider]  folic acid (FOLVITE) 1 MG tablet Take 1 mg by mouth at bedtime.    [provider]  furosemide (LASIX) 80 MG tablet Take 1 tablet (80 mg total) by mouth daily. 05/17/22   Cirigliano, Vito V, DO  ipratropium (ATROVENT) 0.06 % nasal spray Place 1 spray into the nose daily as  needed for rhinitis. 02/17/19   [provider]  lactulose (CHRONULAC) 10 GM/15ML solution Take 15 mLs (10 g total) by mouth 2 (two) times daily as needed for mild constipation (for bowel movments 2-3 per day). 07/06/21   Pokhrel, Corrie Mckusick, MD  levETIRAcetam (KEPPRA XR) 500 MG 24 hr tablet Take 2 tablets (1,000 mg total) by mouth daily. Patient taking differently: Take 1,000 mg by mouth at bedtime. 12/10/21   Melvenia Beam, MD  levothyroxine (SYNTHROID) 88 MCG tablet Take 88 mcg by mouth every morning. 02/14/22   [provider]  ondansetron (ZOFRAN ODT) 4 MG disintegrating tablet Take 1 tablet (4 mg total) by mouth every 8 (eight) hours as needed for nausea or vomiting. Patient taking differently: Take 4 mg by mouth every 8 (eight) hours as needed for nausea or vomiting (dissolve orally). 02/08/20   Volanda Napoleon, PA-C  pantoprazole (PROTONIX) 20 MG tablet Take 1 tablet (20 mg total) by mouth 2 (two) times daily. Then take 1 tablet by mouth daily Patient taking differently: Take 20 mg by mouth daily. Then take 1 tablet by mouth daily 12/08/21   Cirigliano, Vito V, DO  rifaximin (XIFAXAN) 550 MG TABS tablet Take 1 tablet (550 mg total) by mouth 2 (two) times daily. 12/15/21   Cirigliano, Vito V, DO  spironolactone (ALDACTONE) 100 MG tablet Take 1 tablet (100 mg total) by mouth daily. Take 1 tablet with 50 mg tablet (150 mg total) by  mouth daily. Patient taking differently: Take 150 mg by mouth daily. Taking 1 & 1/2 tabs = 150 mg daily 12/08/21   Cirigliano, Vito V, DO  thiamine 100 MG tablet Take 1 tablet (100 mg total) by mouth daily. 07/06/21   Pokhrel, Corrie Mckusick, MD  traMADol (ULTRAM) 50 MG tablet Take 1 tablet (50 mg total) by mouth at bedtime. 02/22/22   Caren Griffins, MD    Physical Exam: Vitals:   05/27/22 1547 05/27/22 1550 05/27/22 1730  BP: (!) 103/56  (!) 116/59  Pulse: 79  76  Resp: 20  19  Temp: 99.6 F (37.6 C)    TempSrc: Oral    SpO2: 100%  100%  Weight:  66.2 kg   Height:  $Remove'5\' 6"'OHhoQAN$  (1.676 m)    Constitutional: Chronically ill-appearing man resting in bed, NAD, calm, comfortable Eyes: EOMI, lids and conjunctivae normal ENMT: Mucous membranes are moist. Posterior pharynx clear of any exudate or lesions.Normal dentition.  Neck: normal, supple, no masses. Respiratory: clear to auscultation bilaterally, no wheezing, no crackles. Normal respiratory effort. No accessory muscle use.  Cardiovascular: Regular rate and rhythm, systolic murmur present. No extremity edema. 2+ pedal pulses. Abdomen: Soft umbilical hernia present.  Mild generalized tenderness to deep palpation.  Musculoskeletal: no clubbing / cyanosis. No joint deformity upper and lower extremities. Good ROM, no contractures. Normal muscle tone.  Skin: Somewhat jaundiced appearance.  No rashes, lesions, ulcers. No induration Neurologic: Sensation intact. Strength 5/5 in all 4.  Psychiatric: Alert and oriented x 3. Normal mood.   EKG: Not performed. Assessment/Plan Principal Problem:   SBP (spontaneous bacterial peritonitis) ([place]) Active Problems:   Alcoholic cirrhosis of liver with ascites (HCC)   AKI (acute kidney injury) ([place])   Chronic hyponatremia   Type 2 diabetes mellitus ([place])   History of seizure   Hypothyroidism   Depression with anxiety   KEVANTE LUNT is a 61 y.o. male with medical history significant for decompensated  hepatic cirrhosis (secondary to alcohol abstinent since 6384) complicated by portal  hypertensive gastropathy and recurrent ascites requiring weekly paracentesis, chronic hyponatremia, T2DM, seizure disorder, HLD, hypothyroidism, umbilical hernia who is admitted with SBP.  Assessment and Plan: * SBP (spontaneous bacterial peritonitis) ([place]) Paracentesis performed 10/25 shows PMNs >5500.  No organisms seen on Gram stain, culture in process.  WBC is 11.2.  Has AKI with creatinine up to 1.86.  T. bili increased to 5.2 from baseline of ~3.5.  Concern is he may also be developing hepatorenal syndrome. -Continue IV ceftriaxone 2 g daily -Give IV albumin 1 g/kg x 2 days -Start midodrine 7.5 mg q8h -Start octreotide infusion 50 mcg/hour  AKI (acute kidney injury) ([place]) Creatinine up to 1.86 compared to previous baseline around 0.9-1.0 (as above concern for hepatorenal syndrome. -Hold Lasix and spironolactone -Continue albumin, midodrine, octreotide infusion -Monitor UOP  Alcoholic cirrhosis of liver with ascites ([place]) Follows with [facility], Dr. Bryan Lemma, and establishing with [place] [facility].  Has been requiring weekly paracentesis.  Now admitted with concern for SBP and hepatorenal syndrome as above.  Na-MELD score is 31 indicating a 52.6% 19-month mortality risk.  He reports abstinence from alcohol since 2022. -Holding Lasix and spironolactone as above -Continue lactulose and rifaximin  Chronic hyponatremia Secondary to hepatic cirrhosis.  Sodium slightly decreased from recent baseline at 124. -Holding diuretics -Continue ceftriaxone, midodrine, octreotide, and albumin as above  Type 2 diabetes mellitus ([place]) Place on SSI.  History of seizure Continue Keppra.  Depression with anxiety Continue BuSpar, home Klonopin.  Holding Lexapro for now with hyponatremia.  Hypothyroidism Continue Synthroid.  DVT prophylaxis: heparin injection 5,000 Units Start: 05/27/22 2200 Code Status: Full  code, confirmed with patient on admission Family Communication: Discussed with patient, he has discussed with family Disposition Plan: From home and likely discharge to home pending clinical progress Consults called: [facility] to consult in a.m. Severity of Illness: The appropriate patient status for this patient is INPATIENT. Inpatient status is judged to be reasonable and necessary in order to provide the required intensity of service to ensure the patient's safety. The patient's presenting symptoms, physical exam findings, and initial radiographic and laboratory data in the context of their chronic comorbidities is felt to place them at high risk for further clinical deterioration. Furthermore, it is not anticipated that the patient will be medically stable for discharge from the hospital within 2 midnights of admission.   * I certify that at the point of admission it is my clinical judgment that the patient will require inpatient hospital care spanning beyond 2 midnights from the point of admission due to high intensity of service, high risk for further deterioration and high frequency of surveillance required.Zada Finders MD [facility]  If 7PM-7AM, please contact night-coverage [email or web address]  05/27/2022, 7:56 PM

## 2022-05-27 NOTE — Assessment & Plan Note (Signed)
Continue BuSpar, home Klonopin.  Holding Lexapro for now with hyponatremia.

## 2022-05-27 NOTE — ED Provider Notes (Signed)
[facility]ERGENCY DEPARTMENT Provider Note   CSN: [id number] Arrival date & time: 05/27/22  1335     History  Chief Complaint  Patient presents with   Bacteria in Paracentesis    Andrew Patton is a 61 y.o. male.  HPI  Patient is a 61 year old male with a past medical history significant for cirrhosis, HTN, ascites, depression, hyperlipidemia, DM 2  Patient is present emergency room today with complaints of continued abdominal pain.  He was seen yesterday for similar and had a paracentesis which yielded evidence of SBP.  He was told to come back to the emergency room.  He has not been aware of any fevers at home.  His wife at bedside states that he has been somewhat more confused than usual.  He denies any lightheadedness or dizziness syncope or near syncope. Denies any recent alcohol use.  Has had several episodes of nonbloody nonbilious emesis.  No chest pain or difficulty breathing.     Home Medications Prior to Admission medications   Medication Sig Start Date End Date Taking? Authorizing Provider  busPIRone (BUSPAR) 15 MG tablet Take 7.5-15 mg by mouth 2 (two) times daily. Take 1/2 tablet (7.5 mg) in the morning and Take 1 tablet (15 mg) at supper    [provider]  clonazePAM (KLONOPIN) 0.5 MG tablet Take 0.5 mg by mouth at bedtime.    [provider]  colesevelam (WELCHOL) 625 MG tablet Take 625 mg by mouth daily with breakfast.    [provider]  escitalopram (LEXAPRO) 5 MG tablet Take 5 mg by mouth daily. 02/14/22   [provider]  eszopiclone (LUNESTA) 2 MG TABS tablet Take 2 mg by mouth at bedtime. Take immediately before bedtime    [provider]  ezetimibe (ZETIA) 10 MG tablet Take 10 mg by mouth daily.    [provider]  fluticasone (FLONASE) 50 MCG/ACT nasal spray Place 1 spray into both nostrils at bedtime. 01/05/13   [provider]  folic acid (FOLVITE) 1 MG tablet Take 1  mg by mouth at bedtime.    [provider]  furosemide (LASIX) 80 MG tablet Take 1 tablet (80 mg total) by mouth daily. 05/17/22   Cirigliano, Vito V, DO  ipratropium (ATROVENT) 0.06 % nasal spray Place 1 spray into the nose daily as needed for rhinitis. 02/17/19   [provider]  lactulose (CHRONULAC) 10 GM/15ML solution Take 15 mLs (10 g total) by mouth 2 (two) times daily as needed for mild constipation (for bowel movments 2-3 per day). 07/06/21   Pokhrel, Corrie Mckusick, MD  levETIRAcetam (KEPPRA XR) 500 MG 24 hr tablet Take 2 tablets (1,000 mg total) by mouth daily. Patient taking differently: Take 1,000 mg by mouth at bedtime. 12/10/21   Melvenia Beam, MD  levothyroxine (SYNTHROID) 88 MCG tablet Take 88 mcg by mouth every morning. 02/14/22   [provider]  ondansetron (ZOFRAN ODT) 4 MG disintegrating tablet Take 1 tablet (4 mg total) by mouth every 8 (eight) hours as needed for nausea or vomiting. Patient taking differently: Take 4 mg by mouth every 8 (eight) hours as needed for nausea or vomiting (dissolve orally). 02/08/20   Volanda Napoleon, PA-C  pantoprazole (PROTONIX) 20 MG tablet Take 1 tablet (20 mg total) by mouth 2 (two) times daily. Then take 1 tablet by mouth daily Patient taking differently: Take 20 mg by mouth daily. Then take 1 tablet by mouth daily 12/08/21   Cirigliano, [employer]  V, DO  rifaximin (XIFAXAN) 550 MG TABS tablet Take 1 tablet (550 mg total) by mouth 2 (two) times daily. 12/15/21   Cirigliano, Vito V, DO  spironolactone (ALDACTONE) 100 MG tablet Take 1 tablet (100 mg total) by mouth daily. Take 1 tablet with 50 mg tablet (150 mg total) by mouth daily. Patient taking differently: Take 150 mg by mouth daily. Taking 1 & 1/2 tabs = 150 mg daily 12/08/21   Cirigliano, Vito V, DO  thiamine 100 MG tablet Take 1 tablet (100 mg total) by mouth daily. 07/06/21   Pokhrel, Corrie Mckusick, MD  traMADol (ULTRAM) 50 MG tablet Take 1 tablet (50 mg total) by mouth at bedtime.  02/22/22   Caren Griffins, MD      Allergies    Crestor [rosuvastatin calcium], Meloxicam, Rosuvastatin, and Penicillins    Review of Systems   Review of Systems  Physical Exam Updated Vital Signs BP (!) 116/59   Pulse 76   Temp 99.6 F (37.6 C) (Oral)   Resp 19   Ht 5\' 6"  (1.676 m)   Wt 66.2 kg   SpO2 100%   BMI 23.57 kg/m  Physical Exam Vitals and nursing note reviewed.  Constitutional:      General: He is not in acute distress.    Comments: Unwell appearing 61 year old male  HENT:     Head: Normocephalic and atraumatic.     Nose: Nose normal.  Eyes:     General: Scleral icterus present.  Cardiovascular:     Rate and Rhythm: Normal rate and regular rhythm.     Pulses: Normal pulses.     Heart sounds: Normal heart sounds.  Pulmonary:     Effort: Pulmonary effort is normal. No respiratory distress.     Breath sounds: No wheezing.  Abdominal:     Palpations: Abdomen is soft.     Tenderness: There is abdominal tenderness.     Hernia: A hernia is present.  Musculoskeletal:     Cervical back: Normal range of motion.     Right lower leg: No edema.     Left lower leg: No edema.  Skin:    General: Skin is warm and dry.     Capillary Refill: Capillary refill takes less than 2 seconds.     Coloration: Skin is jaundiced.  Neurological:     Mental Status: He is alert. Mental status is at baseline.  Psychiatric:        Mood and Affect: Mood normal.        Behavior: Behavior normal.     ED Results / Procedures / Treatments   Labs (all labs ordered are listed, but only abnormal results are displayed) Labs Reviewed  COMPREHENSIVE METABOLIC PANEL - Abnormal; Notable for the following components:      Result Value   Sodium 124 (*)    Chloride 91 (*)    Glucose, Bld 120 (*)    BUN 28 (*)    Creatinine, Ser 1.86 (*)    Calcium 8.7 (*)    Total Protein 5.7 (*)    Albumin 3.1 (*)    Total Bilirubin 5.2 (*)    GFR, Estimated 41 (*)    All other components within  normal limits  CBC WITH DIFFERENTIAL/PLATELET - Abnormal; Notable for the following components:   WBC 11.2 (*)    RBC 3.02 (*)    Hemoglobin 10.4 (*)    HCT 28.3 (*)    MCH 34.4 (*)    MCHC 36.7 (*)  Neutro Abs 8.2 (*)    Monocytes Absolute 2.0 (*)    Abs Immature Granulocytes 0.08 (*)    All other components within normal limits  PROTIME-INR - Abnormal; Notable for the following components:   Prothrombin Time 20.2 (*)    INR 1.7 (*)    All other components within normal limits  AMMONIA - Abnormal; Notable for the following components:   Ammonia 64 (*)    All other components within normal limits  LACTIC ACID, PLASMA - Abnormal; Notable for the following components:   Lactic Acid, Venous 2.8 (*)    All other components within normal limits  CULTURE, BLOOD (ROUTINE X 2)  CULTURE, BLOOD (ROUTINE X 2)  URINE CULTURE  LIPASE, BLOOD  LACTIC ACID, PLASMA    EKG None  Radiology US Paracentesis  Result Date: 05/26/2022 INDICATION: Patient with history of alcoholic cirrhosis, recurrent ascites, portal hypertensive gastropathy; request received for diagnostic and therapeutic paracentesis. EXAM: ULTRASOUND GUIDED DIAGNOSTIC AND THERAPEUTIC PARACENTESIS MEDICATIONS: 8 ml 1% lidocaine COMPLICATIONS: None immediate. PROCEDURE: Informed written consent was obtained from the patient after a discussion of the risks, benefits and alternatives to treatment. A timeout was performed prior to the initiation of the procedure. Initial ultrasound scanning demonstrates a moderate amount of ascites within the right lower abdominal quadrant. The right lower abdomen was prepped and draped in the usual sterile fashion. 1% lidocaine was used for local anesthesia. Following this, a 19 gauge, 7-cm, Yueh catheter was introduced. An ultrasound image was saved for documentation purposes. The paracentesis was performed. The catheter was removed and a dressing was applied. The patient tolerated the procedure well  without immediate post procedural complication. FINDINGS: A total of approximately 3.1 liters of turbid, yellow fluid was removed. Samples were sent to the laboratory as requested by the clinical team. IMPRESSION: Successful ultrasound-guided diagnostic and therapeutic paracentesis yielding 3.1 liters of peritoneal fluid. PLAN: The patient has previously been formally evaluated by the [facility] and is being actively followed for potential future intervention. Patient currently followed by [facility] for possible transplant and TIPS. Next follow-up with [facility] is in November per patient Read by: Rowe Robert, PA-C Electronically Signed   By: Lucrezia Europe M.D.   On: 05/26/2022 15:24   CT Abdomen Pelvis W Contrast  Result Date: 05/26/2022 CLINICAL DATA:  Abdominal pain.  History of cirrhosis. EXAM: CT ABDOMEN AND PELVIS WITH CONTRAST TECHNIQUE: Multidetector CT imaging of the abdomen and pelvis was performed using the standard protocol following bolus administration of intravenous contrast. RADIATION DOSE REDUCTION: This exam was performed according to the departmental dose-optimization program which includes automated exposure control, adjustment of the mA and/or kV according to patient size and/or use of iterative reconstruction technique. CONTRAST:  109mL OMNIPAQUE IOHEXOL 300 MG/ML  SOLN COMPARISON:  02/19/2022 FINDINGS: Lower chest: No acute abnormality. Hepatobiliary: Shrunken nodular liver compatible with cirrhosis. No focal liver abnormality. Gallbladder appears unremarkable. No gallstones identified. No signs of gallbladder wall thickening. Pancreas: Unremarkable. No pancreatic ductal dilatation or surrounding inflammatory changes. Spleen: Normal in size without focal abnormality. Adrenals/Urinary Tract: Normal adrenal glands. No nephrolithiasis, hydronephrosis or mass. Urinary bladder is unremarkable. Stomach/Bowel: Stomach is normal. Patient is status post  appendectomy. No pathologic dilatation of the bowel loops. No bowel wall thickening or inflammation noted. Vascular/Lymphatic: Aortic atherosclerosis. No aneurysm. Upper abdominal collateral vessel formation is noted with recanalization of the umbilical vein. Portal vein, portal venous confluence, SMV and splenic vein are all patent. No signs of abdominopelvic adenopathy.  Reproductive: Prostate is unremarkable. Other: There is a moderate volume of ascites within the abdomen and pelvis, increased from previous exam. No discrete fluid collections. No signs of pneumoperitoneum. Fat containing umbilical hernia measures 6.6 x 3.5 cm, image 67/2. Small amount of fluid and diffuse haziness of the herniated fat is noted. This appears similar to the previous examination from 02/19/2022. Musculoskeletal: No acute or significant osseous findings. IMPRESSION: 1. No acute findings within the abdomen or pelvis. 2. Morphologic features of the liver compatible with cirrhosis. 3. Moderate volume of ascites, increased from previous exam. 4. Fat containing umbilical hernia with small amount of fluid and diffuse haziness of the herniated fat. This appears similar to the previous examination from 02/19/2022. 5.  Aortic Atherosclerosis (ICD10-I70.0). Electronically Signed   By: Kerby Moors M.D.   On: 05/26/2022 07:52    Procedures .Critical Care  Performed by: Tedd Sias, PA Authorized by: Tedd Sias, PA   Critical care provider statement:    Critical care time (minutes):  35   Critical care time was exclusive of:  Separately billable procedures and treating other patients and teaching time   Critical care was necessary to treat or prevent imminent or life-threatening deterioration of the following conditions: Bacterial peritonitis.   Critical care was time spent personally by me on the following activities:  Development of treatment plan with patient or surrogate, review of old charts, re-evaluation of  patient's condition, pulse oximetry, ordering and review of radiographic studies, ordering and review of laboratory studies, ordering and performing treatments and interventions, obtaining history from patient or surrogate, examination of patient and evaluation of patient's response to treatment   Care discussed with: admitting provider       Medications Ordered in ED Medications  cefTRIAXone (ROCEPHIN) 2 g in sodium chloride 0.9 % 100 mL IVPB (2 g Intravenous New Bag/Given 05/27/22 1843)  ondansetron (ZOFRAN-ODT) disintegrating tablet 8 mg (8 mg Oral Given 05/27/22 1615)  HYDROmorphone (DILAUDID) injection 0.5 mg (0.5 mg Intramuscular Given 05/27/22 1616)  HYDROmorphone (DILAUDID) injection 0.5 mg (0.5 mg Intravenous Given 05/27/22 1838)    ED Course/ Medical Decision Making/ A&P                           Medical Decision Making Risk Prescription drug management.   This patient presents to the ED for concern of abdominal pain, jaundice, this involves a number of treatment options, and is a complaint that carries with it a high risk of complications and morbidity. A differential diagnosis was considered for the patient's symptoms which is discussed below:   In this case the patient's differential diagnosis is relatively narrow given that he has already had a diagnostic paracentesis which yielded greater than 5000 neutrophils consistent with SBP.  This diagnosis has a high mortality.  He will need admission   Co morbidities: Discussed in HPI   Brief History:  Patient is a 61 year old male with a past medical history significant for cirrhosis, HTN, ascites, depression, hyperlipidemia, DM 2  Patient is present emergency room today with complaints of continued abdominal pain.  He was seen yesterday for similar and had a paracentesis which yielded evidence of SBP.  He was told to come back to the emergency room.  He has not been aware of any fevers at home.  His wife at bedside  states that he has been somewhat more confused than usual.  He denies any lightheadedness or dizziness syncope or near syncope.  Denies any recent alcohol use.  Has had several episodes of nonbloody nonbilious emesis.  No chest pain or difficulty breathing.    EMR reviewed including pt PMHx, past surgical history and past visits to ER.   See HPI for more details   Lab Tests:   I ordered and independently interpreted labs. Labs notable for Hyponatremia of 124, gradually worsening/elevating creatinine.  Elevated BUN consistent with worsening kidney function.  CBC with anemia of 10.4 platelets within normal limits.  Ammonia elevated at 64 lactic elevated 2.8 lipase within normal limits.  Blood cultures, urine culture ordered.  Imaging Studies:  No imaging studies ordered for this patient  I personally reviewed the CT from yesterday  IMPRESSION: 1. No acute findings within the abdomen or pelvis. 2. Morphologic features of the liver compatible with cirrhosis. 3. Moderate volume of ascites, increased from previous exam. 4. Fat containing umbilical hernia with small amount of fluid and diffuse haziness of the herniated fat. This appears similar to the previous examination from 02/19/2022. 5.  Aortic Atherosclerosis (ICD10-I70.0).    Cardiac Monitoring:  The patient was maintained on a cardiac monitor.  I personally viewed and interpreted the cardiac monitored which showed an underlying rhythm of: NSR NA   Medicines ordered:  I ordered medication including Dilaudid, for pain.  Zofran for nausea.  Rocephin 2 g IV for SBP Reevaluation of the patient after these medicines showed that the patient improved I have reviewed the patients home medicines and have made adjustments as needed   Critical Interventions:     Consults/Attending Physician   I requested consultation with Dr. Lyndel Safe of [facility],  and discussed lab and imaging findings as well as pertinent plan - they recommend:  Admission to hospitalist.  Patient will be seen tomorrow by rounding team. Dr. Posey Pronto of hospitalist service will admit.  Reevaluation:  After the interventions noted above I re-evaluated patient and found that they have :improved   Social Determinants of Health:      Problem List / ED Course:  SBP treated with Rocephin Hyponatremia Anemia Worsening kidney function   Dispostion:  After consideration of the diagnostic results and the patients response to treatment, I feel that the patent would benefit from admission    Final Clinical Impression(s) / ED Diagnoses Final diagnoses:  Spontaneous bacterial peritonitis [facility])    Rx / [place] Orders ED Discharge Orders     None         Tedd Sias, [place] 05/27/22 1854    Pattricia Boss, MD 05/28/22 [phone]

## 2022-05-27 NOTE — Assessment & Plan Note (Signed)
Creatinine up to 1.86 compared to previous baseline around 0.9-1.0 (as above concern for hepatorenal syndrome. -Hold Lasix and spironolactone -Continue albumin, midodrine, octreotide infusion -Monitor UOP

## 2022-05-28 DIAGNOSIS — K7031 Alcoholic cirrhosis of liver with ascites: Secondary | ICD-10-CM | POA: Diagnosis not present

## 2022-05-28 DIAGNOSIS — N179 Acute kidney failure, unspecified: Secondary | ICD-10-CM

## 2022-05-28 DIAGNOSIS — F418 Other specified anxiety disorders: Secondary | ICD-10-CM

## 2022-05-28 DIAGNOSIS — Z87898 Personal history of other specified conditions: Secondary | ICD-10-CM

## 2022-05-28 DIAGNOSIS — K652 Spontaneous bacterial peritonitis: Secondary | ICD-10-CM | POA: Diagnosis not present

## 2022-05-28 LAB — CBC
HCT: 23.1 % — ABNORMAL LOW (ref 39.0–52.0)
Hemoglobin: 8.3 g/dL — ABNORMAL LOW (ref 13.0–17.0)
MCH: 33.6 pg (ref 26.0–34.0)
MCHC: 35.9 g/dL (ref 30.0–36.0)
MCV: 93.5 fL (ref 80.0–100.0)
Platelets: 171 10*3/uL (ref 150–400)
RBC: 2.47 MIL/uL — ABNORMAL LOW (ref 4.22–5.81)
RDW: 12.8 % (ref 11.5–15.5)
WBC: 6.3 10*3/uL (ref 4.0–10.5)
nRBC: 0 % (ref 0.0–0.2)

## 2022-05-28 LAB — HEMOGLOBIN A1C
Hgb A1c MFr Bld: 5.2 % (ref 4.8–5.6)
Mean Plasma Glucose: 102.54 mg/dL

## 2022-05-28 LAB — URINALYSIS, COMPLETE (UACMP) WITH MICROSCOPIC
Bacteria, UA: NONE SEEN
Bilirubin Urine: NEGATIVE
Glucose, UA: NEGATIVE mg/dL
Hgb urine dipstick: NEGATIVE
Ketones, ur: NEGATIVE mg/dL
Leukocytes,Ua: NEGATIVE
Nitrite: NEGATIVE
Protein, ur: NEGATIVE mg/dL
Specific Gravity, Urine: 1.009 (ref 1.005–1.030)
pH: 5 (ref 5.0–8.0)

## 2022-05-28 LAB — COMPREHENSIVE METABOLIC PANEL
ALT: 13 U/L (ref 0–44)
AST: 25 U/L (ref 15–41)
Albumin: 3.3 g/dL — ABNORMAL LOW (ref 3.5–5.0)
Alkaline Phosphatase: 54 U/L (ref 38–126)
Anion gap: 7 (ref 5–15)
BUN: 29 mg/dL — ABNORMAL HIGH (ref 6–20)
CO2: 22 mmol/L (ref 22–32)
Calcium: 8.3 mg/dL — ABNORMAL LOW (ref 8.9–10.3)
Chloride: 95 mmol/L — ABNORMAL LOW (ref 98–111)
Creatinine, Ser: 1.86 mg/dL — ABNORMAL HIGH (ref 0.61–1.24)
GFR, Estimated: 41 mL/min — ABNORMAL LOW (ref >60)
Glucose, Bld: 127 mg/dL — ABNORMAL HIGH (ref 70–99)
Potassium: 3.7 mmol/L (ref 3.5–5.1)
Sodium: 124 mmol/L — ABNORMAL LOW (ref 135–145)
Total Bilirubin: 3.7 mg/dL — ABNORMAL HIGH (ref 0.3–1.2)
Total Protein: 5 g/dL — ABNORMAL LOW (ref 6.5–8.1)

## 2022-05-28 LAB — PROTIME-INR
INR: 1.9 — ABNORMAL HIGH (ref 0.8–1.2)
Prothrombin Time: 21.5 seconds — ABNORMAL HIGH (ref 11.4–15.2)

## 2022-05-28 LAB — CBG MONITORING, ED
Glucose-Capillary: 160 mg/dL — ABNORMAL HIGH (ref 70–99)
Glucose-Capillary: 126 mg/dL — ABNORMAL HIGH (ref 70–99)
Glucose-Capillary: 133 mg/dL — ABNORMAL HIGH (ref 70–99)

## 2022-05-28 LAB — GLUCOSE, CAPILLARY
Glucose-Capillary: 107 mg/dL — ABNORMAL HIGH (ref 70–99)
Glucose-Capillary: 128 mg/dL — ABNORMAL HIGH (ref 70–99)

## 2022-05-28 LAB — PROTEIN / CREATININE RATIO, URINE
Creatinine, Urine: 78 mg/dL
Total Protein, Urine: <6 mg/dL

## 2022-05-28 LAB — HEMOGLOBIN AND HEMATOCRIT, BLOOD
HCT: 22.3 % — ABNORMAL LOW (ref 39.0–52.0)
Hemoglobin: 8.3 g/dL — ABNORMAL LOW (ref 13.0–17.0)

## 2022-05-28 LAB — SODIUM, URINE, RANDOM: Sodium, Ur: <10 mmol/L

## 2022-05-28 MED ORDER — MIDODRINE HCL 5 MG PO TABS
10.0000 mg | ORAL_TABLET | Freq: Three times a day (TID) | ORAL | Status: DC
  Administered 2022-05-28 – 2022-06-01 (×13): 10 mg via ORAL
  Filled 2022-05-28 (×13): qty 2

## 2022-05-28 MED ORDER — TRAMADOL HCL 50 MG PO TABS
50.0000 mg | ORAL_TABLET | Freq: Four times a day (QID) | ORAL | Status: DC | PRN
  Administered 2022-05-28 – 2022-05-29 (×2): 50 mg via ORAL
  Filled 2022-05-28 (×3): qty 1

## 2022-05-28 MED ORDER — TRAMADOL HCL 50 MG PO TABS
50.0000 mg | ORAL_TABLET | Freq: Every day | ORAL | Status: DC
  Administered 2022-05-28: 50 mg via ORAL
  Filled 2022-05-28: qty 1

## 2022-05-28 MED ORDER — FLUTICASONE PROPIONATE 50 MCG/ACT NA SUSP
1.0000 | Freq: Every day | NASAL | Status: DC
  Administered 2022-05-28 – 2022-05-29 (×3): 1 via NASAL
  Filled 2022-05-28 (×3): qty 16

## 2022-05-28 NOTE — Progress Notes (Addendum)
[facility] PROGRESS NOTE  KEROLOS NEHME  DJS:[id number] DOB: 10/28/1960 DOA: 05/27/2022 PCP: Deland Pretty, MD Outpatient Specialists: [facility] Brief Narrative: Andrew Patton is a 61 y.o. male with a history of alcohol abuse (in remission since 8850), alcoholic cirrhosis (under consideration for transplant/TIPS) complicated by portal hypertensive gastropathy and recurrent ascites requiring weekly paracentesis, chronic hyponatremia, T2DM, seizure disorder, HLD, hypothyroidism, and umbilical hernia who returned to the ED 10/26 with concern for SBP based on labs from paracentesis 10/25 when he had presented to the ED for abdominal pain, nausea and vomiting, underwent 3.1L tap. Imaging at that time showed no acute findings, WBC 13.6k. Peritoneal fluid yielded >5,511 nucleated cells, 70% PMNs. On return to the ED, temperature 99.31F, HR 79bpm, borderline hypotension at 103/56. WBC 11.2k, creatinine up to 1.86 (baseline 1.0). Ceftriaxone started while awaiting culture data and patient admitted with GI consultation pending. Midodrine was given, albumin 1g/kg daily x2 days initiated, and octreotide gtt started for concern of hepatorenal syndrome. Leukocytosis has resolved, symptoms improving, though remains hypotensive with stable creatinine at 1.86.  Subjective: Seen still in ED, reporting abdominal discomfort, nausea. Has pain mostly around umbilical hernia though it is chronic, no worse than usual, planning surgery at [facility].  Objective: BP (!) 99/53 (BP Location: Left Arm)   Pulse 60   Temp 98 F (36.7 C) (Oral)   Resp 16   Ht 5\' 6"  (1.676 m)   Wt 66.2 kg   SpO2 100%   BMI 23.57 kg/m   Gen: Pleasant, chronically ill-appearing male in no distress Pulm: Clear and nonlabored  CV: Regular borderline bradycardia, no murmur, no JVD, minimal edema GI: Soft, slightly distended, diffusely tender modestly without rebound or guarding. Umbilical hernia easily reducible, nontender.  Neuro:  Alert and oriented. No focal deficits. Ext: Warm, no deformities Skin: No rashes, lesions or ulcers  Assessment & Plan: Principal Problem:   SBP (spontaneous bacterial peritonitis) (HCC) Active Problems:   Alcoholic cirrhosis of liver with ascites (HCC)   AKI (acute kidney injury) ([place])   Chronic hyponatremia   Type 2 diabetes mellitus ([place])   History of seizure   Hypothyroidism   Depression with anxiety  SBP, recurrent ascites: Has undergone paracentesis every 1-2 weeks since April 2023.  - Monitor culture (NGTD) on empiric CTX 2g q24h  AKI, concern for hepatorenal syndrome:  - Check FENa, urine w/microscopy. - Continue octreotide gtt, albumin 1g/kg, and midodrine to support BP. Increased dose this AM.  - Monitor metabolic panel and UOP closely.  Decompensated alcoholic hepatic cirrhosis: With stable hyperbilirubinemia (bili 5.2) , hypoalbuminemia (3.1), coagulopathy (INR 1.7), hyponatremia (124). MELD-Na is 31.  - Appreciate GI recommendations. Lasix/spironolactone on hold. 2g sodium restriction - Continue rifaximin, prn lactulose. Ammonia elevated though mentation is stable  Umbilical hernia: Reducible easily.  - Follow up after discharge for consideration of repair at [facility] per pt.  T2DM:  - SSI  Depression, anxiety:  - Continue buspirone, prn clonazepam (cautiously)  Seizure disorder: Followed by Dr. Jaynee Eagles.  - Continue keppra  Hypothyroidism: Recent TSH 1.162.  - Continue synthroid  GERD:  - PPI  HLD:  - Continue ezetimibe   Anemia: Worsened without acute bleeding noted.  - Recheck H/H for confirmation and again if bleeding develops.    [name], MD [facility] [email or web address] 05/28/2022, 4:21 PM

## 2022-05-28 NOTE — Consult Note (Addendum)
Consultation  Referring Provider:  [facility]/ Posey Pronto Primary Care Physician:  Deland Pretty, MD Primary Gastroenterologist:  Dr. Bryan Lemma  Reason for Consultation: Decompensated cirrhosis, SBP  HPI: Andrew Patton is a 61 y.o. male, known to Dr. Bryan Lemma history of decompensated EtOH induced cirrhosis (abstinent since [id number]), worse complicated by refractory ascites, requiring weekly paracenteses currently.  He also has chronic hyponatremia, hypertensive gastropathy adult onset diabetes mellitus, and seizure disorder.  He is just recently been established with [facility] to undergo potential transplant evaluation and possible TIPS.  He had presented to the emergency room on 05/26/2022 with complaints of abdominal pain.  He underwent a paracentesis, then was discharged home. Cell counts from the paracentesis were consistent with SBP he was called yesterday and advised to return to the emergency room. He has not been feeling well over the past couple of days, continues to complain of abdominal pain over his baseline abdominal discomfort with ascites. Labs on 05/26/2022 had shown WBC of 13.6 Pounds from the paracentesis with PMNs greater than 5500, culture negative so far  Labs yesterday WBC 11.2/hemoglobin 10.4/hematocrit 28.3/platelets 221 Sodium 124/BUN 28/creatinine 1.86 Albumin 3.1 T. bili 5.2 INR 1.7/pro time 20.2 Ammonia 64 lactate 2.8  Today WBC down to 6.3/hemoglobin 8.3 INR 1.9 Sodium stable at 124 BUN 21/creatinine 1.86 stable  He was appropriately started on ceftriaxone on admission, was given IV albumin last night, and currently ordered daily Also concern for early HRS, and has been started on octreotide, midodrine and albumin. Diuretics are being held.  Current MELD-Na= 31  Patient says he feels about the same today, mentation a little slow but appropriate.  Abdomen feels fairly tight.  Past Medical History:  Diagnosis Date   Allergy    Anxiety    Arthritis     Ascites    Diabetes (HCC)    Elevated transaminase level    Fatigue    Fatty liver    Hyperlipidemia    Hypertension    "under control", not on any blood pressure medication, has lost 60 lbs   Hypothyroidism    on synthroid   Inguinal muscle strain    Insomnia    Left knee pain    "long time ago"   Multinodular goiter    Obesity    Pneumonia    Psoriasis    Tinnitus     Past Surgical History:  Procedure Laterality Date   APPENDECTOMY  1976   FOOT SURGERY Bilateral 1975   IR PARACENTESIS  11/25/2021   IR PARACENTESIS  12/02/2021   IR PARACENTESIS  12/17/2021   IR PARACENTESIS  01/05/2022   IR PARACENTESIS  02/04/2022   IR PARACENTESIS  02/15/2022   IR PARACENTESIS  03/03/2022   IR PARACENTESIS  03/10/2022   IR PARACENTESIS  03/16/2022   IR PARACENTESIS  03/23/2022   IR PARACENTESIS  03/29/2022   IR PARACENTESIS  04/06/2022   IR PARACENTESIS  04/20/2022   IR PARACENTESIS  05/04/2022   IR PARACENTESIS  05/20/2022   IR RADIOLOGIST EVAL & MGMT  12/17/2021   IR RADIOLOGIST EVAL & MGMT  12/31/2021   NOSE SURGERY     ROTATOR CUFF REPAIR Left    left   SHOULDER SURGERY Right 12/20/2019   [place]     Prior to Admission medications   Medication Sig Start Date End Date Taking? Authorizing Provider  busPIRone (BUSPAR) 15 MG tablet Take 7.5-15 mg by mouth 2 (two) times daily. Take 1/2 tablet (7.5 mg) in the morning  and Take 1 tablet (15 mg) at supper   Yes [provider]  clonazePAM (KLONOPIN) 0.5 MG tablet Take 0.5 mg by mouth at bedtime.   Yes [provider]  colesevelam (WELCHOL) 625 MG tablet Take 625 mg by mouth daily with breakfast.   Yes [provider]  escitalopram (LEXAPRO) 5 MG tablet Take 5 mg by mouth daily. 02/14/22  Yes [provider]  eszopiclone (LUNESTA) 2 MG TABS tablet Take 2 mg by mouth at bedtime. Take immediately before bedtime   Yes [provider]  ezetimibe (ZETIA) 10 MG tablet Take 10 mg by mouth daily.    Yes [provider]  fluticasone (FLONASE) 50 MCG/ACT nasal spray Place 1 spray into both nostrils at bedtime. 01/05/13  Yes [provider]  folic acid (FOLVITE) 1 MG tablet Take 1 mg by mouth at bedtime.   Yes [provider]  furosemide (LASIX) 80 MG tablet Take 1 tablet (80 mg total) by mouth daily. 05/17/22  Yes Cirigliano, Vito V, DO  ipratropium (ATROVENT) 0.06 % nasal spray Place 1 spray into the nose daily as needed for rhinitis. 02/17/19  Yes [provider]  lactulose (CHRONULAC) 10 GM/15ML solution Take 15 mLs (10 g total) by mouth 2 (two) times daily as needed for mild constipation (for bowel movments 2-3 per day). 07/06/21  Yes Pokhrel, Laxman, MD  levETIRAcetam (KEPPRA XR) 500 MG 24 hr tablet Take 2 tablets (1,000 mg total) by mouth daily. Patient taking differently: Take 1,000 mg by mouth at bedtime. 12/10/21  Yes Melvenia Beam, MD  levothyroxine (SYNTHROID) 88 MCG tablet Take 88 mcg by mouth every morning. 02/14/22  Yes [provider]  ondansetron (ZOFRAN ODT) 4 MG disintegrating tablet Take 1 tablet (4 mg total) by mouth every 8 (eight) hours as needed for nausea or vomiting. Patient taking differently: Take 4 mg by mouth every 8 (eight) hours as needed for nausea or vomiting (dissolve orally). 02/08/20  Yes Volanda Napoleon, PA-C  pantoprazole (PROTONIX) 20 MG tablet Take 1 tablet (20 mg total) by mouth 2 (two) times daily. Then take 1 tablet by mouth daily Patient taking differently: Take 20 mg by mouth daily. Then take 1 tablet by mouth daily 12/08/21  Yes Cirigliano, Vito V, DO  rifaximin (XIFAXAN) 550 MG TABS tablet Take 1 tablet (550 mg total) by mouth 2 (two) times daily. 12/15/21  Yes Cirigliano, Vito V, DO  spironolactone (ALDACTONE) 100 MG tablet Take 1 tablet (100 mg total) by mouth daily. Take 1 tablet with 50 mg tablet (150 mg total) by mouth daily. Patient taking differently: Take 150 mg by mouth daily. Taking 1 & 1/2 tabs = 150 mg  daily 12/08/21  Yes Cirigliano, Vito V, DO  thiamine 100 MG tablet Take 1 tablet (100 mg total) by mouth daily. 07/06/21  Yes Pokhrel, Laxman, MD  traMADol (ULTRAM) 50 MG tablet Take 1 tablet (50 mg total) by mouth at bedtime. 02/22/22  Yes Caren Griffins, MD    Current Facility-Administered Medications  Medication Dose Route Frequency Provider Last Rate Last Admin   albumin human 25 % solution 68.75 g  68.75 g Intravenous Daily Lenore Cordia, MD   Stopped at 05/28/22 0323   busPIRone (BUSPAR) tablet 15 mg  15 mg Oral QPM Zada Finders R, MD   15 mg at 05/27/22 2111   busPIRone (BUSPAR) tablet 7.5 mg  7.5 mg Oral q AM Zada Finders R, MD   7.5 mg at 05/28/22  [id number]   cefTRIAXone (ROCEPHIN) 2 g in sodium chloride 0.9 % 100 mL IVPB  2 g Intravenous Q24H Zada Finders R, MD       clonazePAM (KLONOPIN) tablet 0.5 mg  0.5 mg Oral QHS Zada Finders R, MD   0.5 mg at 05/27/22 2110   colesevelam [facility]) tablet 625 mg  625 mg Oral Q breakfast Zada Finders R, MD   625 mg at 05/28/22 0820   ezetimibe (ZETIA) tablet 10 mg  10 mg Oral Daily Lenore Cordia, MD       fluticasone (FLONASE) 50 MCG/ACT nasal spray 1 spray  1 spray Each Nare QHS Lenore Cordia, MD   1 spray at [id number]   folic acid (FOLVITE) tablet 1 mg  1 mg Oral QHS Zada Finders R, MD   1 mg at 05/27/22 2111   heparin injection 5,000 Units  5,000 Units Subcutaneous Q8H Zada Finders R, MD   5,000 Units at 05/28/22 0644   insulin aspart (novoLOG) injection 0-9 Units  0-9 Units Subcutaneous TID WC Zada Finders R, MD   1 Units at 05/28/22 0742   lactulose (CHRONULAC) 10 GM/15ML solution 10 g  10 g Oral BID PRN Lenore Cordia, MD       levETIRAcetam (KEPPRA XR) 24 hr tablet 1,000 mg  1,000 mg Oral QHS Zada Finders R, MD   1,000 mg at 05/27/22 2112   levothyroxine (SYNTHROID) tablet 88 mcg  88 mcg Oral Q0600 Lenore Cordia, MD   88 mcg at 05/28/22 0820   midodrine (PROAMATINE) tablet 7.5 mg  7.5 mg Oral Q8H Patel, Vishal R, MD   7.5 mg  at 05/28/22 0640   octreotide (SANDOSTATIN) 500 mcg in sodium chloride 0.9 % 250 mL (2 mcg/mL) infusion  50 mcg/hr Intravenous Continuous Lenore Cordia, MD 25 mL/hr at 05/28/22 0742 50 mcg/hr at 05/28/22 0742   ondansetron (ZOFRAN) tablet 4 mg  4 mg Oral Q6H PRN Lenore Cordia, MD       Or   ondansetron (ZOFRAN) injection 4 mg  4 mg Intravenous Q6H PRN Lenore Cordia, MD       pantoprazole (PROTONIX) EC tablet 20 mg  20 mg Oral Daily Lenore Cordia, MD       rifaximin Doreene Nest) tablet 550 mg  550 mg Oral BID Zada Finders R, MD   550 mg at 05/27/22 2113   sodium chloride flush (NS) 0.9 % injection 3 mL  3 mL Intravenous Q12H Zada Finders R, MD       thiamine (VITAMIN B1) tablet 100 mg  100 mg Oral Daily Lenore Cordia, MD       traMADol (ULTRAM) tablet 50 mg  50 mg Oral QHS Zada Finders R, MD   50 mg at 05/28/22 0137   Current Outpatient Medications  Medication Sig Dispense Refill   busPIRone (BUSPAR) 15 MG tablet Take 7.5-15 mg by mouth 2 (two) times daily. Take 1/2 tablet (7.5 mg) in the morning and Take 1 tablet (15 mg) at supper     clonazePAM (KLONOPIN) 0.5 MG tablet Take 0.5 mg by mouth at bedtime.     colesevelam (WELCHOL) 625 MG tablet Take 625 mg by mouth daily with breakfast.     escitalopram (LEXAPRO) 5 MG tablet Take 5 mg by mouth daily.     eszopiclone (LUNESTA) 2 MG TABS tablet Take 2 mg by mouth at bedtime. Take immediately before bedtime     ezetimibe (ZETIA) 10 MG  tablet Take 10 mg by mouth daily.     fluticasone (FLONASE) 50 MCG/ACT nasal spray Place 1 spray into both nostrils at bedtime.     folic acid (FOLVITE) 1 MG tablet Take 1 mg by mouth at bedtime.     furosemide (LASIX) 80 MG tablet Take 1 tablet (80 mg total) by mouth daily. 30 tablet 3   ipratropium (ATROVENT) 0.06 % nasal spray Place 1 spray into the nose daily as needed for rhinitis.     lactulose (CHRONULAC) 10 GM/15ML solution Take 15 mLs (10 g total) by mouth 2 (two) times daily as needed for mild  constipation (for bowel movments 2-3 per day).     levETIRAcetam (KEPPRA XR) 500 MG 24 hr tablet Take 2 tablets (1,000 mg total) by mouth daily. (Patient taking differently: Take 1,000 mg by mouth at bedtime.) 180 tablet 3   levothyroxine (SYNTHROID) 88 MCG tablet Take 88 mcg by mouth every morning.     ondansetron (ZOFRAN ODT) 4 MG disintegrating tablet Take 1 tablet (4 mg total) by mouth every 8 (eight) hours as needed for nausea or vomiting. (Patient taking differently: Take 4 mg by mouth every 8 (eight) hours as needed for nausea or vomiting (dissolve orally).) 6 tablet 0   pantoprazole (PROTONIX) 20 MG tablet Take 1 tablet (20 mg total) by mouth 2 (two) times daily. Then take 1 tablet by mouth daily (Patient taking differently: Take 20 mg by mouth daily. Then take 1 tablet by mouth daily) 60 tablet 3   rifaximin (XIFAXAN) 550 MG TABS tablet Take 1 tablet (550 mg total) by mouth 2 (two) times daily. 180 tablet 3   spironolactone (ALDACTONE) 100 MG tablet Take 1 tablet (100 mg total) by mouth daily. Take 1 tablet with 50 mg tablet (150 mg total) by mouth daily. (Patient taking differently: Take 150 mg by mouth daily. Taking 1 & 1/2 tabs = 150 mg daily) 60 tablet 0   thiamine 100 MG tablet Take 1 tablet (100 mg total) by mouth daily. 90 tablet 0   traMADol (ULTRAM) 50 MG tablet Take 1 tablet (50 mg total) by mouth at bedtime. 20 tablet 0    Allergies as of 05/27/2022 - Review Complete 05/27/2022  Allergen Reaction Noted   Crestor [rosuvastatin calcium] Other (See Comments) 03/15/2019   Meloxicam Other (See Comments) 03/08/2019   Rosuvastatin Other (See Comments) 07/02/2021   Penicillins Rash 01/24/2013    Family History  Problem Relation Age of Onset   Breast cancer Mother    Hyperlipidemia Mother    Other Mother        ? form of parkinson's    Parkinsonism Mother    Bladder Cancer Father    Parkinson's disease Father    Memory loss Maternal Grandmother        in her 61s   Birth  defects Maternal Grandfather    Birth defects Paternal Grandmother    Colon cancer Neg Hx    Esophageal cancer Neg Hx    Rectal cancer Neg Hx    Stomach cancer Neg Hx    Colon polyps Neg Hx    Dementia Neg Hx    Alzheimer's disease Neg Hx     Social History   Socioeconomic History   Marital status: Married    Spouse name: Not on file   Number of children: 2   Years of education: Not on file   Highest education level: Associate degree: academic program  Occupational History   Occupation: Press photographer  Tobacco Use   Smoking status: Former    Types: Cigarettes    Quit date: 03/27/1994    Years since quitting: 28.1   Smokeless tobacco: Former    Types: Chew   Tobacco comments:    did chew as a teenager  Vaping Use   Vaping Use: Never used  Substance and Sexual Activity   Alcohol use: Not Currently    Alcohol/week: 2.0 standard drinks of alcohol    Types: 2 Glasses of wine per week   Drug use: No   Sexual activity: Not on file  Other Topics Concern   Not on file  Social History Narrative   Lives at home with wife    Right handed   Caffeine: maybe 1-3 cups/week   Social Determinants of Health   Financial Resource Strain: Not on file  Food Insecurity: Not on file  Transportation Needs: Not on file  Physical Activity: Not on file  Stress: Not on file  Social Connections: Not on file  Intimate Partner Violence: Not on file    Review of Systems: Pertinent positive and negative review of systems were noted in the above HPI section.  All other review of systems was otherwise negative.   Physical Exam: Vital signs in last 24 hours: Temp:  [97.7 F (36.5 C)-99.6 F (37.6 C)] 97.7 F (36.5 C) (10/27 0725) Pulse Rate:  [58-123] 58 (10/27 0800) Resp:  [9-20] 12 (10/27 0800) BP: (93-117)/(41-62) 95/53 (10/27 0800) SpO2:  [95 %-100 %] 95 % (10/27 0800) Weight:  [66.2 kg] 66.2 kg (10/26 1550) Last BM Date : 05/28/22 General:   Alert,  Well-developed, chronically  ill-appearing older white male pleasant and cooperative in NAD Head:  Normocephalic and atraumatic. Eyes:  Sclera icteric   conjunctiva pink. Ears:  Normal auditory acuity. Nose:  No deformity, discharge,  or lesions. Mouth:  No deformity or lesions.   Neck:  Supple; no masses or thyromegaly. Lungs:  Clear throughout to auscultation.   No wheezes, crackles, or rhonchi. Heart:  Regular rate and rhythm; no murmurs, clicks, rubs,  or gallops. Abdomen:  Soft, large amount of ascites nontense BS active,nonpalp mass or hsm.,  Umbilical hernia Rectal:  not done Msk:  Symmetrical without gross deformities. . Pulses:  Normal pulses noted. Extremities:  Without clubbing or edema. Neurologic:  Alert and  oriented x4;  grossly normal neurologically. Skin:  Intact without significant lesions or rashes.. Psych:  Alert and cooperative. Normal mood and affect.  Intake/Output from previous day: No intake/output data recorded. Intake/Output this shift: No intake/output data recorded.  Lab Results: Recent Labs    05/26/22 0121 05/27/22 1612 05/28/22 0515  WBC 13.6* 11.2* 6.3  HGB 10.9* 10.4* 8.3*  HCT 30.0* 28.3* 23.1*  PLT 297 221 171   BMET Recent Labs    05/26/22 0121 05/27/22 1612 05/28/22 0515  NA 125* 124* 124*  K 3.6 3.9 3.7  CL 96* 91* 95*  CO2 18* 23 22  GLUCOSE 124* 120* 127*  BUN 16 28* 29*  CREATININE 1.54* 1.86* 1.86*  CALCIUM 8.5* 8.7* 8.3*   LFT Recent Labs    05/28/22 0515  PROT 5.0*  ALBUMIN 3.3*  AST 25  ALT 13  ALKPHOS 54  BILITOT 3.7*   PT/INR Recent Labs    05/27/22 1612 05/28/22 0515  LABPROT 20.2* 21.5*  INR 1.7* 1.9*     IMPRESSION:  #25 61 year old white male with EtOH induced decompensated cirrhosis with refractory ascites, requiring weekly paracenteses and history of portal  gastropathy. Now admitted with SBP, and acute kidney injury concerning for early HRS MELD-Na equals 31  Recent consultation at [facility] hepatology to be considered for  transplant, and possible TIPS  #2 chronic hyponatremia #3.  Adult onset diabetes mellitus #4 seizure disorder #5 depression #6 anemia, normocytic #7 coagulopathy secondary to above  PLAN: Carb Modified 2 g sodium diet Continue ceftriaxone Continue midodrine, octreotide and albumin  Hold Diuretics Daily labs Stop Ambien Plan for repeat paracenteses in 3 to 5 days, to reassess SBP, volume will be dependent on his renal status  GI will follow with you     Amy Esterwood PA-C 05/28/2022, 10:18 AM      [facility] Attending   I have taken an interval history, reviewed the chart and examined the patient. I agree with the Advanced Practitioner's note, impression and recommendations.  Majority the medical decision-making in the formulation of the assessment and plan were performed by me.   SBP ? HRS on appropriate tx  Typically reassess SBP w/ paracentesis at 5 d unless clinically resolved. He could need a paracentesis sooner for relief of sxs but will need to be careful w/ vol changes and suspected HRS   Gatha Mayer, MD, [facility] See Shea Evans on call - gastroenterology for best contact person 05/28/2022 6:40 PM

## 2022-05-28 NOTE — Progress Notes (Signed)
Pt admitted to [facility] from [facility] ED.  Pt is A&O X 4 and neuro intact.  Pt placed on telemetry and CCMD notified.  CHG bath completed.  Vitals taken.  Pt is oriented to unit with call light in reach.    05/28/22 1553  Vitals  Temp 98 F (36.7 C)  Temp Source Oral  BP (!) 99/53  MAP (mmHg) 67  BP Location Left Arm  BP Method Automatic  Patient Position (if appropriate) Lying  Pulse Rate 60  Pulse Rate Source Radial  ECG Heart Rate (!) 58  Resp 16  Level of Consciousness  Level of Consciousness Alert  Oxygen Therapy  SpO2 100 %  O2 Device Room Air  Pain Assessment  Pain Scale 0-10  Pain Score 8  Patients Stated Pain Goal 6  Pain Type Acute pain  Pain Location Abdomen  Pain Descriptors / Indicators Aching  Pain Frequency Constant  Pain Onset On-going  Pain Intervention(s) Medication (See eMAR)  Clinical Progression Not changed  Multiple Pain Sites No  POSS Scale (Pasero Opioid Sedation Scale)  POSS *See Group Information* 1-Acceptable,Awake and alert  Glasgow Coma Scale  Eye Opening 4  Best Verbal Response (NON-intubated) 5  Best Motor Response 6  Glasgow Coma Scale Score 15  MEWS Score  MEWS Temp 0  MEWS Systolic 1  MEWS Pulse 0  MEWS RR 0  MEWS LOC 0  MEWS Score 1  MEWS Score Color Green

## 2022-05-29 DIAGNOSIS — F418 Other specified anxiety disorders: Secondary | ICD-10-CM | POA: Diagnosis not present

## 2022-05-29 DIAGNOSIS — K652 Spontaneous bacterial peritonitis: Secondary | ICD-10-CM | POA: Diagnosis not present

## 2022-05-29 DIAGNOSIS — K7031 Alcoholic cirrhosis of liver with ascites: Secondary | ICD-10-CM | POA: Diagnosis not present

## 2022-05-29 DIAGNOSIS — N179 Acute kidney failure, unspecified: Secondary | ICD-10-CM | POA: Diagnosis not present

## 2022-05-29 LAB — CBC WITH DIFFERENTIAL/PLATELET
Abs Immature Granulocytes: 0.02 10*3/uL (ref 0.00–0.07)
Basophils Absolute: 0.1 10*3/uL (ref 0.0–0.1)
Basophils Relative: 1 %
Eosinophils Absolute: 0.3 10*3/uL (ref 0.0–0.5)
Eosinophils Relative: 5 %
HCT: 22.1 % — ABNORMAL LOW (ref 39.0–52.0)
Hemoglobin: 8.1 g/dL — ABNORMAL LOW (ref 13.0–17.0)
Immature Granulocytes: 0 %
Lymphocytes Relative: 18 %
Lymphs Abs: 1.1 10*3/uL (ref 0.7–4.0)
MCH: 34.2 pg — ABNORMAL HIGH (ref 26.0–34.0)
MCHC: 36.7 g/dL — ABNORMAL HIGH (ref 30.0–36.0)
MCV: 93.2 fL (ref 80.0–100.0)
Monocytes Absolute: 0.9 10*3/uL (ref 0.1–1.0)
Monocytes Relative: 15 %
Neutro Abs: 3.8 10*3/uL (ref 1.7–7.7)
Neutrophils Relative %: 61 %
Platelets: 195 10*3/uL (ref 150–400)
RBC: 2.37 MIL/uL — ABNORMAL LOW (ref 4.22–5.81)
RDW: 13 % (ref 11.5–15.5)
WBC: 6.1 10*3/uL (ref 4.0–10.5)
nRBC: 0 % (ref 0.0–0.2)

## 2022-05-29 LAB — COMPREHENSIVE METABOLIC PANEL
ALT: 11 U/L (ref 0–44)
AST: 20 U/L (ref 15–41)
Albumin: 3.1 g/dL — ABNORMAL LOW (ref 3.5–5.0)
Alkaline Phosphatase: 51 U/L (ref 38–126)
Anion gap: 9 (ref 5–15)
BUN: 31 mg/dL — ABNORMAL HIGH (ref 6–20)
CO2: 21 mmol/L — ABNORMAL LOW (ref 22–32)
Calcium: 8.2 mg/dL — ABNORMAL LOW (ref 8.9–10.3)
Chloride: 100 mmol/L (ref 98–111)
Creatinine, Ser: 1.85 mg/dL — ABNORMAL HIGH (ref 0.61–1.24)
GFR, Estimated: 41 mL/min — ABNORMAL LOW (ref >60)
Glucose, Bld: 107 mg/dL — ABNORMAL HIGH (ref 70–99)
Potassium: 3.7 mmol/L (ref 3.5–5.1)
Sodium: 130 mmol/L — ABNORMAL LOW (ref 135–145)
Total Bilirubin: 3.6 mg/dL — ABNORMAL HIGH (ref 0.3–1.2)
Total Protein: 4.7 g/dL — ABNORMAL LOW (ref 6.5–8.1)

## 2022-05-29 LAB — TYPE AND SCREEN
ABO/RH(D): A POS
Antibody Screen: NEGATIVE

## 2022-05-29 LAB — URINE CULTURE: Culture: NO GROWTH

## 2022-05-29 LAB — GLUCOSE, CAPILLARY
Glucose-Capillary: 105 mg/dL — ABNORMAL HIGH (ref 70–99)
Glucose-Capillary: 106 mg/dL — ABNORMAL HIGH (ref 70–99)
Glucose-Capillary: 135 mg/dL — ABNORMAL HIGH (ref 70–99)
Glucose-Capillary: 107 mg/dL — ABNORMAL HIGH (ref 70–99)

## 2022-05-29 LAB — ABO/RH: ABO/RH(D): A POS

## 2022-05-29 LAB — PROTIME-INR
INR: 1.8 — ABNORMAL HIGH (ref 0.8–1.2)
Prothrombin Time: 20.8 seconds — ABNORMAL HIGH (ref 11.4–15.2)

## 2022-05-29 MED ORDER — ALBUMIN HUMAN 25 % IV SOLN
68.5000 g | Freq: Once | INTRAVENOUS | Status: AC
  Administered 2022-05-29: 68.5 g via INTRAVENOUS
  Filled 2022-05-29: qty 300

## 2022-05-29 MED ORDER — ALBUMIN HUMAN 25 % IV SOLN
66.0000 g | Freq: Once | INTRAVENOUS | Status: DC
  Filled 2022-05-29: qty 280

## 2022-05-29 MED ORDER — OXYCODONE HCL 5 MG PO TABS
5.0000 mg | ORAL_TABLET | ORAL | Status: DC | PRN
  Administered 2022-05-29 – 2022-06-01 (×13): 5 mg via ORAL
  Filled 2022-05-29 (×13): qty 1

## 2022-05-29 NOTE — Progress Notes (Signed)
[facility] PROGRESS NOTE  [name]  UJW:[id number] DOB: Aug 11, 1960 DOA: 05/27/2022 PCP: Deland Pretty, MD Outpatient Specialists: [facility] Brief Narrative: Andrew Patton is a 61 y.o. male with a history of alcohol abuse (in remission since 5621), alcoholic cirrhosis (under consideration for transplant/TIPS) complicated by portal hypertensive gastropathy and recurrent ascites requiring weekly paracentesis, chronic hyponatremia, T2DM, seizure disorder, HLD, hypothyroidism, and umbilical hernia who returned to the ED 10/26 with concern for SBP based on labs from paracentesis 10/25 when he had presented to the ED for abdominal pain, nausea and vomiting, underwent 3.1L tap. Imaging at that time showed no acute findings, WBC 13.6k. Peritoneal fluid yielded >5,511 nucleated cells, 70% PMNs. On return to the ED, temperature 99.15F, HR 79bpm, borderline hypotension at 103/56. WBC 11.2k, creatinine up to 1.86 (baseline 1.0). Ceftriaxone started while awaiting culture data and patient admitted with GI consultation pending. Midodrine was given, albumin 1g/kg daily x2 days initiated, and octreotide gtt started for concern of hepatorenal syndrome. Leukocytosis has resolved, symptoms improving, though remains hypotensive with stable creatinine at 1.86.  Subjective: Abdominal pain mostly about the hernia but also slightly diffuse is severe, constant, no longer sharp, now more dull, not improved with tramadol at all. Wife at bedside. Pt has no reports of vomiting or fevers.    Objective: BP (!) 95/50 (BP Location: Right Arm)   Pulse (!) 56   Temp 98.1 F (36.7 C) (Oral)   Resp 14   Ht 5\' 6"  (1.676 m)   Wt 66.1 kg   SpO2 100%   BMI 23.52 kg/m   Gen: Frail 61yo M in no distress Pulm: Clear, nonlabored  CV: Regular bradycardia, sinus on monitor. Soft early systolic murmur at sternal border. GI: Soft, minimally tender abdomen diffusely, umbilical hernia remains easily reducible but tender.  No erythema noted. +BS. Some fluid wave suspected. Neuro: Alert and oriented, no asterixis. No new focal deficits. Ext: Warm, no deformities Skin: No new rashes, lesions or ulcers on visualized skin   Assessment & Plan: SBP, recurrent ascites: Has undergone paracentesis every 1-2 weeks since April 2023.  - Monitor culture (NGTD) on empiric CTX 2g q24h. Blood cultures 10/26 NGTD. Has defervesced. - Plan repeat paracentesis 10/30. - Tramadol ineffective, will try oxycodone.  AKI, concern for hepatorenal syndrome: FENa low, hyaline casts but otherwise bland urine. SCr stable though elevated. - Continue octreotide gtt - Remains with soft BP. Repeat albumin 1g/kg, and midodrine to support BP. Increased dose this AM.  - Monitor metabolic panel and UOP closely.  Decompensated alcoholic hepatic cirrhosis: With stable hyperbilirubinemia (bili 5.2) , hypoalbuminemia (3.1), coagulopathy (INR 1.7), hyponatremia (124). MELD-Na is 31.  - Appreciate GI recommendations. Lasix/spironolactone on hold. 2g sodium restriction. - Continue rifaximin, prn lactulose. Ammonia elevated though mentation remains at baseline.  Umbilical hernia: Remains tender but easily reducible.  - Follow up after discharge for consideration of repair at [facility] per pt.  T2DM:  - SSI  Depression, anxiety:  - Continue buspirone, prn clonazepam (cautiously)  Seizure disorder: Followed by Dr. Jaynee Eagles.  - Continue keppra  Hypothyroidism: Recent TSH 1.162.  - Continue synthroid  GERD:  - PPI  HLD:  - Continue ezetimibe   Anemia: Worsened without acute bleeding noted, now stabilizing.    [name], MD [facility] [email or web address] 05/29/2022, 2:16 PM

## 2022-05-29 NOTE — Plan of Care (Signed)

## 2022-05-30 DIAGNOSIS — F418 Other specified anxiety disorders: Secondary | ICD-10-CM | POA: Diagnosis not present

## 2022-05-30 DIAGNOSIS — K7031 Alcoholic cirrhosis of liver with ascites: Secondary | ICD-10-CM | POA: Diagnosis not present

## 2022-05-30 DIAGNOSIS — N179 Acute kidney failure, unspecified: Secondary | ICD-10-CM | POA: Diagnosis not present

## 2022-05-30 DIAGNOSIS — K652 Spontaneous bacterial peritonitis: Secondary | ICD-10-CM | POA: Diagnosis not present

## 2022-05-30 LAB — CBC WITH DIFFERENTIAL/PLATELET
Abs Immature Granulocytes: 0.02 10*3/uL (ref 0.00–0.07)
Basophils Absolute: 0.1 10*3/uL (ref 0.0–0.1)
Basophils Relative: 1 %
Eosinophils Absolute: 0.4 10*3/uL (ref 0.0–0.5)
Eosinophils Relative: 7 %
HCT: 23.3 % — ABNORMAL LOW (ref 39.0–52.0)
Hemoglobin: 8.4 g/dL — ABNORMAL LOW (ref 13.0–17.0)
Immature Granulocytes: 0 %
Lymphocytes Relative: 21 %
Lymphs Abs: 1.3 10*3/uL (ref 0.7–4.0)
MCH: 34.3 pg — ABNORMAL HIGH (ref 26.0–34.0)
MCHC: 36.1 g/dL — ABNORMAL HIGH (ref 30.0–36.0)
MCV: 95.1 fL (ref 80.0–100.0)
Monocytes Absolute: 1.3 10*3/uL — ABNORMAL HIGH (ref 0.1–1.0)
Monocytes Relative: 21 %
Neutro Abs: 3.1 10*3/uL (ref 1.7–7.7)
Neutrophils Relative %: 50 %
Platelets: 213 10*3/uL (ref 150–400)
RBC: 2.45 MIL/uL — ABNORMAL LOW (ref 4.22–5.81)
RDW: 13.4 % (ref 11.5–15.5)
WBC: 6.1 10*3/uL (ref 4.0–10.5)
nRBC: 0 % (ref 0.0–0.2)

## 2022-05-30 LAB — GLUCOSE, CAPILLARY
Glucose-Capillary: 156 mg/dL — ABNORMAL HIGH (ref 70–99)
Glucose-Capillary: 73 mg/dL (ref 70–99)
Glucose-Capillary: 149 mg/dL — ABNORMAL HIGH (ref 70–99)
Glucose-Capillary: 90 mg/dL (ref 70–99)

## 2022-05-30 LAB — COMPREHENSIVE METABOLIC PANEL
ALT: 11 U/L (ref 0–44)
AST: 19 U/L (ref 15–41)
Albumin: 3.4 g/dL — ABNORMAL LOW (ref 3.5–5.0)
Alkaline Phosphatase: 45 U/L (ref 38–126)
Anion gap: 8 (ref 5–15)
BUN: 26 mg/dL — ABNORMAL HIGH (ref 6–20)
CO2: 21 mmol/L — ABNORMAL LOW (ref 22–32)
Calcium: 8.4 mg/dL — ABNORMAL LOW (ref 8.9–10.3)
Chloride: 104 mmol/L (ref 98–111)
Creatinine, Ser: 1.57 mg/dL — ABNORMAL HIGH (ref 0.61–1.24)
GFR, Estimated: 50 mL/min — ABNORMAL LOW (ref >60)
Glucose, Bld: 119 mg/dL — ABNORMAL HIGH (ref 70–99)
Potassium: 3.7 mmol/L (ref 3.5–5.1)
Sodium: 133 mmol/L — ABNORMAL LOW (ref 135–145)
Total Bilirubin: 4 mg/dL — ABNORMAL HIGH (ref 0.3–1.2)
Total Protein: 5 g/dL — ABNORMAL LOW (ref 6.5–8.1)

## 2022-05-30 LAB — BODY FLUID CULTURE W GRAM STAIN: Culture: NO GROWTH

## 2022-05-30 LAB — PROTIME-INR
INR: 1.9 — ABNORMAL HIGH (ref 0.8–1.2)
Prothrombin Time: 21.2 seconds — ABNORMAL HIGH (ref 11.4–15.2)

## 2022-05-30 MED ORDER — ALBUMIN HUMAN 25 % IV SOLN
50.0000 g | Freq: Once | INTRAVENOUS | Status: AC
  Administered 2022-05-31: 50 g via INTRAVENOUS
  Filled 2022-05-30: qty 200

## 2022-05-30 MED ORDER — FLUTICASONE PROPIONATE 50 MCG/ACT NA SUSP
1.0000 | Freq: Every day | NASAL | Status: DC
  Administered 2022-05-31 – 2022-06-01 (×2): 1 via NASAL

## 2022-05-30 NOTE — Progress Notes (Addendum)
Patient ID: Andrew Patton, male   DOB: 1961/07/25, 61 y.o.   MRN: [id number]    Progress Note   Subjective   Day #4  CC; decompensated EtOH induced cirrhosis, SBP  Ceftriaxone day 4 Octreotide Midodrine  MELD-Na-31 Peritoneal fluid culture no growth to date  Today-WBC 6.1/hemoglobin 8.4/hematocrit 23.3/platelets 213 Pro time 21.2/INR 1.9-stable Sodium 133/BUN 26/creatinine 1.57 improved T. bili 4.0 LFTs otherwise normal  Patient is still complaining of abdominal pain, no better or worse than yesterday, abdomen feels a bit tighter.  No nausea or vomiting, tolerating p.o.'s   Objective   Vital signs in last 24 hours: Temp:  [97.8 F (36.6 C)-98.6 F (37 C)] 98.2 F (36.8 C) (10/29 1140) Pulse Rate:  [51-56] 52 (10/29 1140) Resp:  [10-17] 16 (10/29 1346) BP: (90-104)/(44-61) 103/61 (10/29 1140) SpO2:  [96 %-100 %] 98 % (10/29 1346) Last BM Date : 05/28/22 General:    Older white male in NAD, thin Heart:  Regular rate and rhythm; no murmurs Lungs: Respirations even and unlabored, lungs CTA bilaterally Abdomen: Protuberant, distended, large amount of ascites, mild diffuse tenderness, reducible umbilical hernia , bowel sounds present Extremities: Trace edema edema. Neurologic:  Alert and oriented,  grossly normal neurologically ,no asterixis Psych:  Cooperative. Normal mood and affect.  Intake/Output from previous day: 10/28 0701 - 10/29 0700 In: 606 [I.V.:606] Out: -  Intake/Output this shift: Total I/O In: 85 [I.V.:85] Out: 325 [Urine:325]  Lab Results: Recent Labs    05/28/22 0515 05/28/22 1543 05/29/22 0055 05/30/22 0103  WBC 6.3  --  6.1 6.1  HGB 8.3* 8.3* 8.1* 8.4*  HCT 23.1* 22.3* 22.1* 23.3*  PLT 171  --  195 213   BMET Recent Labs    05/28/22 0515 05/29/22 0055 05/30/22 0103  NA 124* 130* 133*  K 3.7 3.7 3.7  CL 95* 100 104  CO2 22 21* 21*  GLUCOSE 127* 107* 119*  BUN 29* 31* 26*  CREATININE 1.86* 1.85* 1.57*  CALCIUM 8.3* 8.2* 8.4*    LFT Recent Labs    05/30/22 0103  PROT 5.0*  ALBUMIN 3.4*  AST 19  ALT 11  ALKPHOS 45  BILITOT 4.0*   PT/INR Recent Labs    05/29/22 0055 05/30/22 0103  LABPROT 20.8* 21.2*  INR 1.8* 1.9*       Assessment / Plan:    #51 61 year old white male with known decompensated EtOH induced cirrhosis, known to Dr. Barron Alvine, and now followed at [facility] hepatology. Patient has refractory ascites at this point and has been requiring weekly paracenteses. He presented to the emergency room with complaints of abdominal pain on 05/26/2022, underwent a paracentesis and was then sent home After fluid cell counts returned these were consistent with SBP and he was advised to come back to the emergency room-had not been feeling well at home  Now on day 4 ceftriaxone, had albumin day 1 and day 3 Had an increase in ascites and continues to complain of abdominal pain Labs are stable  #2 chronic hyponatremia-improved since admit #3 acute kidney injury-serum for HRS-diuretics have been held-creatinine trending down Is being treated with octreotide and midodrine  #4 adult onset diabetes mellitus #5 seizure disorder #6 anemia normocytic stable #7 coagulopathy secondary to cirrhosis  #8 history of hepatic encephalopathy continue lactulose and Xifaxan  Plan; continue carb modified 2 g sodium diet Continue ceftriaxone for 7-day course then will need chronic prophylaxis Scheduled for paracentesis tomorrow-removed 2 to 3 L, repeat cell counts to reassess improvement  in SBP Will give albumin with paracentesis Patient is hoping he will be able to get scheduled for TIPS through [facility], once the SBP has completely resolved-current MELD sodium of 31, too high for TIPS.   LOS: 3 days   Amy Esterwood  PA-C10/29/2023, 2:28 PM      [facility] Attending   I have taken an interval history, reviewed the chart and examined the patient. I agree with the Advanced Practitioner's note, impression and  recommendations.     Plan for paracentesis tomorrow.  Patient reported that his mother-in-law just died and his father is dying and he is concerned about making the funerals.  No dates yet for these.  Obviously father not yet gone but this is of concern.  I explained that tomorrow's paracentesis will tell us a lot.  It might be possible to stop IV antibiotics depending upon the cell counts then.  He will need SBP prophylaxis regimen at discharge.  Gatha Mayer, MD, [facility] See Shea Evans on call - gastroenterology for best contact person 05/30/2022 4:55 PM

## 2022-05-30 NOTE — Progress Notes (Signed)
[facility] PROGRESS NOTE  Andrew Patton  QKM:[id number] DOB: 02-09-1961 DOA: 05/27/2022 PCP: Andrew Pretty, MD Outpatient Specialists: [facility] Brief Narrative: Andrew Patton is a 61 y.o. male with a history of alcohol abuse (in remission since 5790), alcoholic cirrhosis (under consideration for transplant/TIPS) complicated by portal hypertensive gastropathy and recurrent ascites requiring weekly paracentesis, chronic hyponatremia, T2DM, seizure disorder, HLD, hypothyroidism, and umbilical hernia who returned to the ED 10/26 with concern for SBP based on labs from paracentesis 10/25 when he had presented to the ED for abdominal pain, nausea and vomiting, underwent 3.1L tap. Imaging at that time showed no acute findings, WBC 13.6k. Peritoneal fluid yielded >5,511 nucleated cells, 70% PMNs. On return to the ED, temperature 99.18F, HR 79bpm, borderline hypotension at 103/56. WBC 11.2k, creatinine up to 1.86 (baseline 1.0). Ceftriaxone started while awaiting culture data and patient admitted with GI consultation pending. Midodrine was given, albumin 1g/kg daily x2 days initiated, and octreotide gtt started for concern of hepatorenal syndrome. Leukocytosis has resolved, symptoms improving, though remains hypotensive with stable creatinine at 1.86.  Subjective: Much improvement in abdominal pain with oxycodone, abdomen swelling back up more, no new complaints.   Objective: BP (!) 94/54 (BP Location: Right Arm)   Pulse (!) 54   Temp 97.8 F (36.6 C) (Oral)   Resp 14   Ht 5\' 6"  (1.676 m)   Wt 66.1 kg   SpO2 96%   BMI 23.52 kg/m   Gen: Frail, thin male in no distress Pulm: Nonlabored breathing room air. Clear. CV: Regular rate and rhythm. No murmur, rub, or gallop. No JVD, trace dependent edema. GI: Abdomen distended, not peritonitic, minimally tender diffusely. Umbilical hernia reduces without pain easily today. Distant bowel sounds..  Ext: Warm, no deformities Skin: No new  rashes, lesions or ulcers on visualized skin. Mild jaundice Neuro: Alert and oriented. No focal neurological deficits. Psych: Judgement and insight appear fair. Mood euthymic & affect congruent. Behavior is appropriate.    Assessment & Plan: SBP, recurrent ascites: Has undergone paracentesis every 1-2 weeks since April 2023.  - Monitor culture (NGTD again rechecked this AM.) on empiric CTX 2g q24h. Blood cultures 10/26 NGTD. Has defervesced. - Plan repeat diagnostic/therapeutic paracentesis 10/30. Significant reaccumulation at this time. Volume to be removed with depend on renal function. - Continue oxycodone prn  AKI, concern for hepatorenal syndrome: FENa low, hyaline casts but otherwise bland urine. SCr stable though elevated. - Continue octreotide gtt - Remains with soft BP, though improved with albumin. Will continue midodrine. Avoid IVF/albumin for now with worsening ascites.   - Monitor metabolic panel and UOP closely.  Decompensated alcoholic hepatic cirrhosis: With stable hyperbilirubinemia (bili 5.2) , hypoalbuminemia (3.1), coagulopathy (INR 1.7), hyponatremia (124). MELD-Na is 31.  - Appreciate GI recommendations. Lasix/spironolactone on hold. 2g sodium restriction. - Continue rifaximin, prn lactulose. Ammonia elevated though mentation remains at baseline.  Umbilical hernia: Remains tender but easily reducible.  - Follow up after discharge for [facility] of repair at [facility] per pt.  T2DM:  - SSI  Depression, anxiety:  - Continue buspirone, prn clonazepam (cautiously)  Seizure disorder: Followed by Andrew Patton.  - Continue keppra  Hypothyroidism: Recent TSH 1.162.  - Continue synthroid  GERD:  - PPI  HLD:  - Continue ezetimibe   Anemia: Stabilized. No bleeding - Anemia panel in AM.     Andrew Pour, MD [facility] [email or web address] 05/30/2022, 9:54 AM

## 2022-05-31 ENCOUNTER — Inpatient Hospital Stay (HOSPITAL_COMMUNITY): Payer: Medicare Other

## 2022-05-31 DIAGNOSIS — F418 Other specified anxiety disorders: Secondary | ICD-10-CM | POA: Diagnosis not present

## 2022-05-31 DIAGNOSIS — K652 Spontaneous bacterial peritonitis: Secondary | ICD-10-CM | POA: Diagnosis not present

## 2022-05-31 DIAGNOSIS — N179 Acute kidney failure, unspecified: Secondary | ICD-10-CM | POA: Diagnosis not present

## 2022-05-31 DIAGNOSIS — E039 Hypothyroidism, unspecified: Secondary | ICD-10-CM

## 2022-05-31 DIAGNOSIS — E871 Hypo-osmolality and hyponatremia: Secondary | ICD-10-CM

## 2022-05-31 DIAGNOSIS — K7031 Alcoholic cirrhosis of liver with ascites: Secondary | ICD-10-CM | POA: Diagnosis not present

## 2022-05-31 HISTORY — PX: IR PARACENTESIS: IMG2679

## 2022-05-31 LAB — RETICULOCYTES
Immature Retic Fract: 9.8 % (ref 2.3–15.9)
RBC.: 2.32 MIL/uL — ABNORMAL LOW (ref 4.22–5.81)
Retic Count, Absolute: 36.4 10*3/uL (ref 19.0–186.0)
Retic Ct Pct: 1.6 % (ref 0.4–3.1)

## 2022-05-31 LAB — COMPREHENSIVE METABOLIC PANEL
ALT: 11 U/L (ref 0–44)
AST: 19 U/L (ref 15–41)
Albumin: 3.2 g/dL — ABNORMAL LOW (ref 3.5–5.0)
Alkaline Phosphatase: 59 U/L (ref 38–126)
Anion gap: 7 (ref 5–15)
BUN: 20 mg/dL (ref 6–20)
CO2: 21 mmol/L — ABNORMAL LOW (ref 22–32)
Calcium: 8.3 mg/dL — ABNORMAL LOW (ref 8.9–10.3)
Chloride: 105 mmol/L (ref 98–111)
Creatinine, Ser: 1.52 mg/dL — ABNORMAL HIGH (ref 0.61–1.24)
GFR, Estimated: 52 mL/min — ABNORMAL LOW (ref >60)
Glucose, Bld: 122 mg/dL — ABNORMAL HIGH (ref 70–99)
Potassium: 3.5 mmol/L (ref 3.5–5.1)
Sodium: 133 mmol/L — ABNORMAL LOW (ref 135–145)
Total Bilirubin: 2.9 mg/dL — ABNORMAL HIGH (ref 0.3–1.2)
Total Protein: 4.7 g/dL — ABNORMAL LOW (ref 6.5–8.1)

## 2022-05-31 LAB — PROTIME-INR
INR: 1.8 — ABNORMAL HIGH (ref 0.8–1.2)
Prothrombin Time: 20.7 seconds — ABNORMAL HIGH (ref 11.4–15.2)

## 2022-05-31 LAB — HEMOGLOBIN AND HEMATOCRIT, BLOOD
HCT: 23 % — ABNORMAL LOW (ref 39.0–52.0)
Hemoglobin: 8.1 g/dL — ABNORMAL LOW (ref 13.0–17.0)

## 2022-05-31 LAB — IRON AND TIBC
Iron: 37 ug/dL — ABNORMAL LOW (ref 45–182)
Saturation Ratios: 36 % (ref 17.9–39.5)
TIBC: 104 ug/dL — ABNORMAL LOW (ref 250–450)
UIBC: 67 ug/dL

## 2022-05-31 LAB — BODY FLUID CELL COUNT WITH DIFFERENTIAL
Eos, Fluid: 0 %
Lymphs, Fluid: 32 %
Monocyte-Macrophage-Serous Fluid: 45 % — ABNORMAL LOW (ref 50–90)
Neutrophil Count, Fluid: 23 % (ref 0–25)
Total Nucleated Cell Count, Fluid: 230 cu mm (ref 0–1000)

## 2022-05-31 LAB — GRAM STAIN: Gram Stain: NONE SEEN

## 2022-05-31 LAB — GLUCOSE, CAPILLARY
Glucose-Capillary: 124 mg/dL — ABNORMAL HIGH (ref 70–99)
Glucose-Capillary: 133 mg/dL — ABNORMAL HIGH (ref 70–99)
Glucose-Capillary: 113 mg/dL — ABNORMAL HIGH (ref 70–99)
Glucose-Capillary: 165 mg/dL — ABNORMAL HIGH (ref 70–99)

## 2022-05-31 LAB — FOLATE: Folate: 31.3 ng/mL (ref >5.9)

## 2022-05-31 LAB — FERRITIN: Ferritin: 179 ng/mL (ref 24–336)

## 2022-05-31 LAB — VITAMIN B12: Vitamin B-12: 1018 pg/mL — ABNORMAL HIGH (ref 180–914)

## 2022-05-31 MED ORDER — LIDOCAINE HCL 1 % IJ SOLN
INTRAMUSCULAR | Status: AC
  Filled 2022-05-31: qty 20

## 2022-05-31 NOTE — Plan of Care (Signed)
Plan of care reviewed.   Problem: Pain Managment: Abdominal hernia, Pt has complaints of mid lower abdominal pain scale 7-8/10. Goal: General experience of comfort will improve Outcome: Progressing: pain is well tolerated after oxycodone 5 mg q 4 hr PRN. Pt is able to rest tonight.   Problem: Clinical Measurements: weakness, increasing abdominal distention due to recurrent ascites from decompensated EtOH induced cirrhosis.  Goal: Respiratory complications will improve Outcome: Progressing: on room air, SPO296-99%,  no SOB, no respiratory distress. Plan repeat therapeutic ultrasound paracenteses this am.   Problem: Clinical Measurements: sinus brady cardia, HR 50s, BP soft, 95/55-101/62. Goal: Cardiovascular complication will be avoided Outcome: Progressing; hemodynamically stable, on Midodrine 10 mg TID. No distress noted.   Problem: Clinical Measurements:  Goal: Will remain free from infection Outcome: Progressing: Ceftriaxone 2 gm q 24 hrs. Pt remains afebrile. Peritoneal fluid culture no growth to date.    Problem: Fluid Volume: Pt has low appetite due to abdominal distention, but Pt is well hydrated with good oral fluid intake. Goal: Ability to maintain a balanced intake and output will improve Outcome: Progressing: will recheck lab BMP this am.   Kennyth Lose, RN

## 2022-05-31 NOTE — Procedures (Signed)
PROCEDURE SUMMARY:  Successful ultrasound guided paracentesis from the left lower quadrant.  Yielded 3 L of clear yellow fluid.  No immediate complications.  The patient tolerated the procedure well.   Specimen sent for labs.  EBL < 2 mL  The patient has previously been formally evaluated by the [facility] and is being actively followed for potential future intervention.  Patient currently followed by [facility] for possible transplant and TIPS. Next follow-up with [facility] is in November per patient  Soyla Dryer, AGACNP-BC [phone] 05/31/2022, 10:23 AM

## 2022-05-31 NOTE — Progress Notes (Signed)
Progress Note    ASSESSMENT AND PLAN:   Decompensated EtOH induced cirrhosis with pHTN Culture-neg SBP on ceftriaxone (day 5), s/p rpt Dx tap today LVP dependent refractory ascites  CKD/HRS on octreotide/midodrine/alb, off diuretics H/O hepatic encephalopathy on lactulose/rifaximin. Umbilical hernia   Plan: -Follow ascitic fluid analysis from paracenteses today -Continue ceftriaxone for now. -Will need SBP prophylaxis at D/C (Cipro 500mg  po QD) -Recheck labs in AM  -Out of bed today. -Has appt with [facility] Nov 3 for TIPS eval but MELD high.  Recalculate meld tomorrow. For now, donot cancel appt. -D/W pt and pts wife.    SUBJECTIVE   Doing much better now Minimal abdominal pain-much better since admission. No fever or chills.  No nausea, vomiting, heartburn, regurgitation, odynophagia or dysphagia.  No significant diarrhea or constipation.  No melena or hematochezia. No unintentional weight loss.  S/P Dx paracenteses today.   Neg ascitic fluid culture, urine culture and blood cultures to date   OBJECTIVE:     Vital signs in last 24 hours: Temp:  [97.7 F (36.5 C)-98.2 F (36.8 C)] 98 F (36.7 C) (10/30 1135) Pulse Rate:  [52-64] 52 (10/30 1135) Resp:  [12-20] 14 (10/30 1135) BP: (95-108)/(47-68) 95/47 (10/30 1135) SpO2:  [97 %-100 %] 97 % (10/30 1135) Weight:  [66.3 kg] 66.3 kg (10/30 0313) Last BM Date : 05/28/22 General:   Alert, well-developed male in NAD EENT:  Normal hearing, mild icteric sclera, conjunctive pink.  Heart:  Regular rate and rhythm; no murmur.  No lower extremity edema   Pulm: Normal respiratory effort, lungs CTA bilaterally without wheezes or crackles. Abdomen:  Soft, nondistended, nontender.  Normal bowel sounds, reducible umbilical hernia.       Neurologic:  Alert and  oriented x4;  grossly normal neurologically. Psych:  Pleasant, cooperative.  Normal mood and affect.   Intake/Output from previous day: 10/29 0701 - 10/30  0700 In: 2602 [P.O.:1860; I.V.:642; IV Piggyback:100] Out: 575 [Urine:575] Intake/Output this shift: No intake/output data recorded.  Lab Results: Recent Labs    05/29/22 0055 05/30/22 0103 05/31/22 0059  WBC 6.1 6.1  --   HGB 8.1* 8.4* 8.1*  HCT 22.1* 23.3* 23.0*  PLT 195 213  --    BMET Recent Labs    05/29/22 0055 05/30/22 0103 05/31/22 0059  NA 130* 133* 133*  K 3.7 3.7 3.5  CL 100 104 105  CO2 21* 21* 21*  GLUCOSE 107* 119* 122*  BUN 31* 26* 20  CREATININE 1.85* 1.57* 1.52*  CALCIUM 8.2* 8.4* 8.3*   LFT Recent Labs    05/31/22 0059  PROT 4.7*  ALBUMIN 3.2*  AST 19  ALT 11  ALKPHOS 59  BILITOT 2.9*   PT/INR Recent Labs    05/30/22 0103 05/31/22 0059  LABPROT 21.2* 20.7*  INR 1.9* 1.8*   Hepatitis Panel No results for input(s): "HEPBSAG", "HCVAB", "HEPAIGM", "HEPBIGM" in the last 72 hours.  IR Paracentesis  Result Date: 05/31/2022 INDICATION: Patient with a history of alcoholic cirrhosis with recurrent ascites. Interventional radiology asked to perform a diagnostic and therapeutic paracentesis up to 3 L. EXAM: ULTRASOUND GUIDED PARACENTESIS MEDICATIONS: 1% lidocaine 10 mL COMPLICATIONS: None immediate. PROCEDURE: Informed written consent was obtained from the patient after a discussion of the risks, benefits and alternatives to treatment. A timeout was performed prior to the initiation of the procedure. Initial ultrasound scanning demonstrates a large amount of ascites within the left lower abdominal quadrant. The left lower abdomen was prepped and  draped in the usual sterile fashion. 1% lidocaine was used for local anesthesia. Following this, a 19 gauge, 7-cm, Yueh catheter was introduced. An ultrasound image was saved for documentation purposes. The paracentesis was performed. The catheter was removed and a dressing was applied. The patient tolerated the procedure well without immediate post procedural complication. FINDINGS: A total of approximately 3 L  of dark yellow fluid was removed. Samples were sent to the laboratory as requested by the clinical team. IMPRESSION: Successful ultrasound-guided paracentesis yielding 3 liters of peritoneal fluid. Read by: Soyla Dryer, NP PLAN: The patient has previously been formally evaluated by the [facility] and is being actively followed for potential future intervention. Patient currently followed by [facility] for possible transplant and TIPS. Next follow-up with [facility] is in November per patient Electronically Signed   By: Jerilynn Mages.  Shick M.D.   On: 05/31/2022 10:26     Principal Problem:   SBP (spontaneous bacterial peritonitis) ([place]) Active Problems:   Hypothyroidism   Type 2 diabetes mellitus ([place])   History of seizure   Chronic hyponatremia   Depression with anxiety   Alcoholic cirrhosis of liver with ascites ([place])   AKI (acute kidney injury) ([place])     LOS: 4 days     Carmell Austria, MD 05/31/2022, 11:51 AM [facility] [phone]

## 2022-05-31 NOTE — Progress Notes (Signed)
[facility] PROGRESS NOTE  Andrew Patton  OBS:[id number] DOB: February 04, 1961 DOA: 05/27/2022 PCP: Andrew Pretty, MD Outpatient Specialists: [facility] Brief Narrative: Andrew Patton is a 61 y.o. male with a history of alcohol abuse (in remission since 4765), alcoholic cirrhosis (under consideration for transplant/TIPS) complicated by portal hypertensive gastropathy and recurrent ascites requiring weekly paracentesis, chronic hyponatremia, T2DM, seizure disorder, HLD, hypothyroidism, and umbilical hernia who returned to the ED 10/26 with concern for SBP based on labs from paracentesis 10/25 when he had presented to the ED for abdominal pain, nausea and vomiting, underwent 3.1L tap. Imaging at that time showed no acute findings, WBC 13.6k. Peritoneal fluid yielded >5,511 nucleated cells, 70% PMNs. On return to the ED, temperature 99.70F, HR 79bpm, borderline hypotension at 103/56. WBC 11.2k, creatinine up to 1.86 (baseline 1.0). Ceftriaxone started while awaiting culture data and patient admitted with GI consultation pending. Midodrine was given, albumin 1g/kg daily x2 days initiated, and octreotide gtt started for concern of hepatorenal syndrome. Leukocytosis has resolved, symptoms improving, though remains hypotensive with stable creatinine at 1.86.  Subjective: Abdominal pain improved, eager for discharge. Ate lunch. No new complaints.  Objective: BP (!) 95/47 (BP Location: Right Arm)   Pulse (!) 52   Temp 98 F (36.7 C) (Oral)   Resp 14   Ht 5\' 6"  (1.676 m)   Wt 66.3 kg   SpO2 97%   BMI 23.59 kg/m   Gen: 61 y.o. male in no distress Pulm: Nonlabored breathing room air. Clear. CV: Regular bradycardia. No murmur, rub, or gallop. No JVD, no dependent edema. GI: Abdomen soft, not tender, reducible umbilical hernia.  +BS Ext: Warm, no deformities Skin: No rashes, lesions or ulcers on visualized skin. Neuro: Alert and oriented. No focal neurological deficits. Psych: Judgement and  insight appear fair. Mood euthymic & affect congruent. Behavior is appropriate.    Assessment & Plan: SBP, LVP-dependent refractory ascites: Has undergone paracentesis every 1-2 weeks since April 2023.  - Culture negative, continuing ceftriaxone 2g q24h per GI recommendations, plan to DC on ciprofloxacin 500mg  po daily for SBP ppx.  - Cell counts drastically improved on repeat 3L paracentesis 10/30. Further studies pending.   AKI, concern for hepatorenal syndrome: FENa low, hyaline casts but otherwise bland urine. SCr stable though elevated. - Continue octreotide gtt - Albumin.  - Continue midodrine.   - Monitor CMP, INR in AM.   Decompensated alcoholic hepatic cirrhosis: With stable hyperbilirubinemia (bili 5.2) , hypoalbuminemia (3.1), coagulopathy (INR 1.7), hyponatremia (124). MELD-Na is 31.  - Appreciate GI recommendations. Lasix/spironolactone on hold. 2g sodium restriction. - Follow up with [facility] as scheduled 11/3.  - Continue rifaximin, prn lactulose. Ammonia elevated though mentation remains at baseline.  Umbilical hernia: Remains tender but easily reducible.  - Follow up after discharge for consideration of repair at [facility] per pt.  T2DM:  - SSI  Depression, anxiety:  - Continue buspirone, prn clonazepam (cautiously)  Seizure disorder: Followed by Dr. Jaynee Patton.  - Continue keppra  Hypothyroidism: Recent TSH 1.162.  - Continue synthroid  GERD:  - PPI  HLD:  - Continue ezetimibe   Anemia: Stabilized. No bleeding. Iron 37, TIBC 104, 36% sat, ferritin 179. B12, folate wnl.     [name], MD [facility] [email or web address] 05/31/2022, 1:41 PM

## 2022-06-01 DIAGNOSIS — F418 Other specified anxiety disorders: Secondary | ICD-10-CM | POA: Diagnosis not present

## 2022-06-01 DIAGNOSIS — K7031 Alcoholic cirrhosis of liver with ascites: Secondary | ICD-10-CM | POA: Diagnosis not present

## 2022-06-01 DIAGNOSIS — E119 Type 2 diabetes mellitus without complications: Secondary | ICD-10-CM

## 2022-06-01 DIAGNOSIS — N179 Acute kidney failure, unspecified: Secondary | ICD-10-CM | POA: Diagnosis not present

## 2022-06-01 DIAGNOSIS — K652 Spontaneous bacterial peritonitis: Secondary | ICD-10-CM | POA: Diagnosis not present

## 2022-06-01 LAB — CBC WITH DIFFERENTIAL/PLATELET
Abs Immature Granulocytes: 0.05 10*3/uL (ref 0.00–0.07)
Basophils Absolute: 0.1 10*3/uL (ref 0.0–0.1)
Basophils Relative: 1 %
Eosinophils Absolute: 0.5 10*3/uL (ref 0.0–0.5)
Eosinophils Relative: 6 %
HCT: 22 % — ABNORMAL LOW (ref 39.0–52.0)
Hemoglobin: 8 g/dL — ABNORMAL LOW (ref 13.0–17.0)
Immature Granulocytes: 1 %
Lymphocytes Relative: 14 %
Lymphs Abs: 1.3 10*3/uL (ref 0.7–4.0)
MCH: 34.6 pg — ABNORMAL HIGH (ref 26.0–34.0)
MCHC: 36.4 g/dL — ABNORMAL HIGH (ref 30.0–36.0)
MCV: 95.2 fL (ref 80.0–100.0)
Monocytes Absolute: 1.4 10*3/uL — ABNORMAL HIGH (ref 0.1–1.0)
Monocytes Relative: 15 %
Neutro Abs: 5.9 10*3/uL (ref 1.7–7.7)
Neutrophils Relative %: 63 %
Platelets: 230 10*3/uL (ref 150–400)
RBC: 2.31 MIL/uL — ABNORMAL LOW (ref 4.22–5.81)
RDW: 13.5 % (ref 11.5–15.5)
WBC: 9.2 10*3/uL (ref 4.0–10.5)
nRBC: 0 % (ref 0.0–0.2)

## 2022-06-01 LAB — GLUCOSE, CAPILLARY: Glucose-Capillary: 122 mg/dL — ABNORMAL HIGH (ref 70–99)

## 2022-06-01 LAB — COMPREHENSIVE METABOLIC PANEL
ALT: 9 U/L (ref 0–44)
AST: 16 U/L (ref 15–41)
Albumin: 3.2 g/dL — ABNORMAL LOW (ref 3.5–5.0)
Alkaline Phosphatase: 48 U/L (ref 38–126)
Anion gap: 6 (ref 5–15)
BUN: 17 mg/dL (ref 6–20)
CO2: 21 mmol/L — ABNORMAL LOW (ref 22–32)
Calcium: 8 mg/dL — ABNORMAL LOW (ref 8.9–10.3)
Chloride: 105 mmol/L (ref 98–111)
Creatinine, Ser: 1.2 mg/dL (ref 0.61–1.24)
GFR, Estimated: >60 mL/min (ref >60)
Glucose, Bld: 125 mg/dL — ABNORMAL HIGH (ref 70–99)
Potassium: 3.7 mmol/L (ref 3.5–5.1)
Sodium: 132 mmol/L — ABNORMAL LOW (ref 135–145)
Total Bilirubin: 2.9 mg/dL — ABNORMAL HIGH (ref 0.3–1.2)
Total Protein: 4.6 g/dL — ABNORMAL LOW (ref 6.5–8.1)

## 2022-06-01 LAB — PROTIME-INR
INR: 1.9 — ABNORMAL HIGH (ref 0.8–1.2)
Prothrombin Time: 21.5 seconds — ABNORMAL HIGH (ref 11.4–15.2)

## 2022-06-01 LAB — CULTURE, BLOOD (ROUTINE X 2): Culture: NO GROWTH

## 2022-06-01 MED ORDER — PANTOPRAZOLE SODIUM 20 MG PO TBEC: 20.0000 mg | DELAYED_RELEASE_TABLET | Freq: Every day | ORAL | Status: DC

## 2022-06-01 MED ORDER — CIPROFLOXACIN HCL 500 MG PO TABS
500.0000 mg | ORAL_TABLET | Freq: Every day | ORAL | Status: DC
  Administered 2022-06-01: 500 mg via ORAL
  Filled 2022-06-01: qty 1

## 2022-06-01 MED ORDER — OXYCODONE HCL 5 MG PO TABS: 5.0000 mg | ORAL_TABLET | Freq: Four times a day (QID) | ORAL | 0 refills | Status: DC | PRN

## 2022-06-01 MED ORDER — CIPROFLOXACIN HCL 500 MG PO TABS: 500.0000 mg | ORAL_TABLET | Freq: Every day | ORAL | 0 refills | Status: DC

## 2022-06-01 NOTE — Progress Notes (Addendum)
Daily Rounding Note  06/01/2022, 10:29 AM  LOS: 5 days   SUBJECTIVE:   Chief complaint:    decompensated cirrhosis.  SBP.  ascites  Has worse central abd pain today, was improving as of yesterday.   BPs are soft, on Midodrine and off high dose diuretics.   1 BM yeasterday, 1 today.  Wife and pt say no evidence of HE  OBJECTIVE:         Vital signs in last 24 hours:    Temp:  [98 F (36.7 C)-98.8 F (37.1 C)] 98.5 F (36.9 C) (10/31 0732) Pulse Rate:  [52-67] 57 (10/31 0732) Resp:  [14-18] 14 (10/31 0732) BP: (88-109)/(39-55) 109/52 (10/31 0732) SpO2:  [97 %-100 %] 100 % (10/31 0732) Weight:  [65.1 kg] 65.1 kg (10/31 0344) Last BM Date : 05/28/22 Filed Weights   05/29/22 0549 05/31/22 0313 06/01/22 0344  Weight: 66.1 kg 66.3 kg 65.1 kg   General: looks chronically ill w overall sacropenia   Heart: RRR Chest: clear bil   Abdomen: soft, mild distention.  Tender wo guard/rebound across upper abdomen.  BS active Extremities: no CCE Neuro/Psych:  oriented x 3.  No asterixis.  Affect subdued.    Intake/Output from previous day: 10/30 0701 - 10/31 0700 In: 1534.7 [P.O.:800; I.V.:534.8; IV Piggyback:199.9] Out: 350 [Urine:350]  Intake/Output this shift: No intake/output data recorded.  Lab Results: Recent Labs    05/30/22 0103 05/31/22 0059 06/01/22 0130  WBC 6.1  --  9.2  HGB 8.4* 8.1* 8.0*  HCT 23.3* 23.0* 22.0*  PLT 213  --  230   BMET Recent Labs    05/30/22 0103 05/31/22 0059 06/01/22 0130  NA 133* 133* 132*  K 3.7 3.5 3.7  CL 104 105 105  CO2 21* 21* 21*  GLUCOSE 119* 122* 125*  BUN 26* 20 17  CREATININE 1.57* 1.52* 1.20  CALCIUM 8.4* 8.3* 8.0*   LFT Recent Labs    05/30/22 0103 05/31/22 0059 06/01/22 0130  PROT 5.0* 4.7* 4.6*  ALBUMIN 3.4* 3.2* 3.2*  AST 19 19 16   ALT 11 11 9   ALKPHOS 45 59 48  BILITOT 4.0* 2.9* 2.9*   PT/INR Recent Labs    05/31/22 0059 06/01/22 0130   LABPROT 20.7* 21.5*  INR 1.8* 1.9*   Hepatitis Panel No results for input(s): "HEPBSAG", "HCVAB", "HEPAIGM", "HEPBIGM" in the last 72 hours.  Studies/Results: IR Paracentesis  Result Date: 05/31/2022 INDICATION: Patient with a history of alcoholic cirrhosis with recurrent ascites. Interventional radiology asked to perform a diagnostic and therapeutic paracentesis up to 3 L. EXAM: ULTRASOUND GUIDED PARACENTESIS MEDICATIONS: 1% lidocaine 10 mL COMPLICATIONS: None immediate. PROCEDURE: Informed written consent was obtained from the patient after a discussion of the risks, benefits and alternatives to treatment. A timeout was performed prior to the initiation of the procedure. Initial ultrasound scanning demonstrates a large amount of ascites within the left lower abdominal quadrant. The left lower abdomen was prepped and draped in the usual sterile fashion. 1% lidocaine was used for local anesthesia. Following this, a 19 gauge, 7-cm, Yueh catheter was introduced. An ultrasound image was saved for documentation purposes. The paracentesis was performed. The catheter was removed and a dressing was applied. The patient tolerated the procedure well without immediate post procedural complication. FINDINGS: A total of approximately 3 L of dark yellow fluid was removed. Samples were sent to the laboratory as requested by the clinical team. IMPRESSION: Successful ultrasound-guided paracentesis yielding 3  liters of peritoneal fluid. Read by: Soyla Dryer, NP PLAN: The patient has previously been formally evaluated by the [facility] and is being actively followed for potential future intervention. Patient currently followed by [facility] for possible transplant and TIPS. Next follow-up with [facility] is in November per patient Electronically Signed   By: Jerilynn Mages.  Shick M.D.   On: 05/31/2022 10:26    Scheduled Meds:  busPIRone  15 mg Oral QPM   busPIRone  7.5 mg Oral q AM    ciprofloxacin  500 mg Oral Q breakfast   clonazePAM  0.5 mg Oral QHS   colesevelam  625 mg Oral Q breakfast   ezetimibe  10 mg Oral Daily   fluticasone  1 spray Each Nare Daily   folic acid  1 mg Oral QHS   heparin  5,000 Units Subcutaneous Q8H   levETIRAcetam  1,000 mg Oral QHS   levothyroxine  88 mcg Oral Q0600   midodrine  10 mg Oral Q8H   pantoprazole  20 mg Oral Daily   rifaximin  550 mg Oral BID   sodium chloride flush  3 mL Intravenous Q12H   thiamine  100 mg Oral Daily   Continuous Infusions:  octreotide (SANDOSTATIN) 500 mcg in sodium chloride 0.9 % 250 mL (2 mcg/mL) infusion 50 mcg/hr (06/01/22 0937)   PRN Meds:.lactulose, ondansetron **OR** ondansetron (ZOFRAN) IV, oxyCODONE   ASSESMENT:     ETOH cirrhosis.  Decompensated.  ETOH abstinent since 2022.  Now established w [facility] hepatology/transplant team.      Refractory ascites, requires weekly paracentesis. TIPS underconsideration.   Home diuretics: Lasix 80 mg/day, spironolactone 150 mg daily.    SBP by tap 10/25, fluid clx negative.  Treated w Rocephin.  Leukocytosis resolved.  Repeat fluid studies 10/30 w improved fluid, switched to prophylactic cipro 500 mg daily.      Abdominal pain.   Umbilical hernia.  [facility] aware.  First step is TIPS to get control of ascites before pursuing hernia repair.  Has seen a surgeon there.      Chronic hyponatremia.  Stable, slightly improved.      AKI.  Resolved.  Midodrine in place, new this admission.  .      Anemia.  Hgb stable in 8s, down from 9.5 - 11.5 this summer.  MCV normal.  Low iron, low TIBC.  Ferritin, Iron sats, folate, b12 are ok.  No hx PRBCs or parenteral iron.      08/2021 EGD: dysphagia, variceal survey.  Mild reflux esophagitis.  PHG.  Duodenal erythema.  Path: normal duodenum, gastric oxyntic mucosa, no H pylori, no inflammation.        Hx HE.  Rifaximin ongoing.  Prn lactulose at home and now.   PLAN   ? When to resart diuretics, will d/w Dr Darnell Level.     May need  narcotic pain mgt going forward.  Tramdol as outpt was ineffective.  His PCP deferred further pain med RX, wanted GI opinion.      Has appts at [facility] this Friday.         Azucena Freed  06/01/2022, 10:29 AM Phone [phone]     Attending physician's note   I have taken history, reviewed the chart and examined the patient. I performed a substantive portion of this encounter, including complete performance of at least one of the key components, in conjunction with the APP. I agree with the Advanced Practitioner's note, impression and recommendations.   Decompensated  EtOH induced cirrhosis with pHTN Culture-neg SBP- Rxed with ceftriaxone x 5 days s/p rpt Dx tap 10/30- resolution. LVP dependent refractory ascites  H/O hepatic encephalopathy on lactulose/rifaximin. GERD on lowest possible dose of Protonix 20mg  po QD Umbilical hernia    Plan: -Cipro 500mg  po QD indefinitely for SBP  -Resume home diuretics -OK to D/C home today.  -Continue low-dose Protonix -Avoid beta-blockers. -Has appt with [facility] Nov 3 for TIPS eval but MELD high.  -D/W pt and pts wife.   MELD 3.0: 22 at 06/01/2022  1:30 AM MELD-Na: 22 at 06/01/2022  1:30 AM Calculated from: Serum Creatinine: 1.20 mg/dL at 06/01/2022  1:30 AM Serum Sodium: 132 mmol/L at 06/01/2022  1:30 AM Total Bilirubin: 2.9 mg/dL at 06/01/2022  1:30 AM Serum Albumin: 3.2 g/dL at 06/01/2022  1:30 AM INR(ratio): 1.9 at 06/01/2022  1:30 AM Age at listing (hypothetical): 60 years Sex: Male at 06/01/2022  1:30 AM    Carmell Austria, MD [facility] [phone]

## 2022-06-01 NOTE — Discharge Summary (Signed)
Physician Discharge Summary   Patient: Andrew Patton MRN: [id number] DOB: 01/06/1961  Admit date:     05/27/2022  Discharge date: 06/01/22  Discharge Physician: [name]   PCP: Deland Pretty, MD   Recommendations at discharge:  Follow up with [facility] for decompensated hepatic cirrhosis as scheduled on 11/3. MELD-Na = 22 on day of discharge.  Note the patient was admitted to [facility] for SBP and mild hepatorenal syndrome that improved with antibiotics, midodrine, octreotide, and albumin. Due to ongoing soft blood pressure, midodrine is prescribed at discharge. Indefinite cipro recommended for SBP ppx. Follow up with general surgery at [facility] for umbilical hernia. This is painful, though not incarcerated, and pain medication was changed from tramadol to oxycodone (5mg  tabs #15 prescribed 10/31 after PDMP review).   Discharge Diagnoses: Principal Problem:   SBP (spontaneous bacterial peritonitis) ([place]) Active Problems:   Alcoholic cirrhosis of liver with ascites (HCC)   AKI (acute kidney injury) ([place])   Chronic hyponatremia   Type 2 diabetes mellitus ([place])   History of seizure   Hypothyroidism   Depression with anxiety  Hospital Course: Andrew Patton is a 61 y.o. male with a history of alcohol abuse (in remission since [id number]), alcoholic cirrhosis (under consideration for transplant/TIPS) complicated by portal hypertensive gastropathy and recurrent ascites requiring weekly paracentesis, chronic hyponatremia, T2DM, seizure disorder, HLD, hypothyroidism, and umbilical hernia who returned to the ED 10/26 with concern for SBP based on labs from paracentesis 10/25 when he had presented to the ED for abdominal pain, nausea and vomiting, underwent 3.1L tap. Imaging at that time showed no acute findings, WBC 13.6k. Peritoneal fluid yielded >5,511 nucleated cells, 70% PMNs. On return to the ED, temperature 99.14F, HR 79bpm, borderline hypotension at 103/56. WBC 11.2k, creatinine up to  1.86 (baseline 1.0). Ceftriaxone started while awaiting culture data and patient admitted with GI consultation pending. Midodrine was given, albumin 1g/kg daily x2 days initiated, and octreotide gtt started for concern of hepatorenal syndrome. Leukocytosis has resolved, symptoms improving, though remains hypotensive with stable creatinine at 1.86.    Assessment and Plan: SBP, LVP-dependent refractory ascites: Has undergone paracentesis every 1-2 weeks since April 2023.  - Culture negative, completed CTX with improvement in para labs. DC on ciprofloxacin 500mg  po daily for SBP ppx.  - Cell counts drastically improved on repeat 3L paracentesis 10/30. Further studies pending.    AKI, hepatorenal syndrome: FENa low, hyaline casts but otherwise bland urine. SCr has continued improvement throughout hospitalization 1.86 >> 1.2.  - Continue midodrine.      Decompensated alcoholic hepatic cirrhosis: With stable hyperbilirubinemia (bili 5.2) , hypoalbuminemia (3.1), coagulopathy (INR 1.7), hyponatremia (124). MELD-Na was 31 on admission >> 22 on day of discharge. - Appreciate GI recommendations, discussed with Dr. Jackquline Denmark. Will restart home lasix/spironolactone and added midodrine. - Follow up with [facility] as scheduled 11/3.  - Continue rifaximin, prn lactulose. Ammonia elevated though mentation remains at baseline. No HE per pt and wife. - Bradycardia limits use of beta blocker.   Umbilical hernia: Remains tender but easily reducible.  - Follow up after discharge for consideration of repair at [facility] per pt. - Oxycodone Rx as above.   T2DM: No change to home medications   Depression, anxiety: No change to home medications   Seizure disorder: Followed by Dr. Jaynee Eagles.  - Continue keppra   Hypothyroidism: Recent TSH 1.162.  - Continue synthroid   GERD:  - Protonix 20mg  po daily per GI  HLD:  - Continue ezetimibe    Anemia: Stabilized. No bleeding. Iron 37, TIBC 104, 36% sat, ferritin 179.  B12, folate wnl.   Pain control - [employer] Controlled Substance Reporting System database was reviewed. and patient was instructed, not to drive, operate heavy machinery, perform activities at heights, swimming or participation in water activities or provide baby-sitting services while on Pain, Sleep and Anxiety Medications; until their outpatient Physician has advised to do so again. Also recommended to not to take more than prescribed Pain, Sleep and Anxiety Medications.   Consultants: GI Procedures performed: Paracentesis 10/30  Disposition: Home Diet recommendation: Sodium restricted DISCHARGE MEDICATION: Allergies as of 06/01/2022       Reactions   Crestor [rosuvastatin Calcium] Other (See Comments)   Elevated liver enzymes   Meloxicam Other (See Comments)   Leg pain   Rosuvastatin Other (See Comments)   Elevated liver enzymes   Penicillins Rash   Has patient had a PCN reaction causing immediate rash, facial/tongue/throat swelling, SOB or lightheadedness with hypotension: No Has patient had a PCN reaction causing severe rash involving mucus membranes or skin necrosis: No Has patient had a PCN reaction that required hospitalization: No Has patient had a PCN reaction occurring within the last 10 years: No If all of the above answers are "NO", then may proceed with Cephalosporin use. Other reaction(s): rash        Medication List     STOP taking these medications    traMADol 50 MG tablet Commonly known as: ULTRAM       TAKE these medications    busPIRone 15 MG tablet Commonly known as: BUSPAR Take 7.5-15 mg by mouth 2 (two) times daily. Take 1/2 tablet (7.5 mg) in the morning and Take 1 tablet (15 mg) at supper   ciprofloxacin 500 MG tablet Commonly known as: CIPRO Take 1 tablet (500 mg total) by mouth daily with breakfast.   clonazePAM 0.5 MG tablet Commonly known as: KLONOPIN Take 0.5 mg by mouth at bedtime.   colesevelam 625 MG tablet Commonly known  as: WELCHOL Take 625 mg by mouth daily with breakfast.   escitalopram 5 MG tablet Commonly known as: LEXAPRO Take 5 mg by mouth daily.   eszopiclone 2 MG Tabs tablet Commonly known as: LUNESTA Take 2 mg by mouth at bedtime. Take immediately before bedtime   ezetimibe 10 MG tablet Commonly known as: ZETIA Take 10 mg by mouth daily.   fluticasone 50 MCG/ACT nasal spray Commonly known as: FLONASE Place 1 spray into both nostrils at bedtime.   folic acid 1 MG tablet Commonly known as: FOLVITE Take 1 mg by mouth at bedtime.   furosemide 80 MG tablet Commonly known as: LASIX Take 1 tablet (80 mg total) by mouth daily.   ipratropium 0.06 % nasal spray Commonly known as: ATROVENT Place 1 spray into the nose daily as needed for rhinitis.   lactulose 10 GM/15ML solution Commonly known as: CHRONULAC Take 15 mLs (10 g total) by mouth 2 (two) times daily as needed for mild constipation (for bowel movments 2-3 per day).   levETIRAcetam 500 MG 24 hr tablet Commonly known as: Keppra XR Take 2 tablets (1,000 mg total) by mouth daily. What changed: when to take this   levothyroxine 88 MCG tablet Commonly known as: SYNTHROID Take 88 mcg by mouth every morning.   ondansetron 4 MG disintegrating tablet Commonly known as: Zofran ODT Take 1 tablet (4 mg total) by mouth every 8 (eight) hours as needed  for nausea or vomiting. What changed: reasons to take this   oxyCODONE 5 MG immediate release tablet Commonly known as: Oxy IR/ROXICODONE Take 1 tablet (5 mg total) by mouth every 6 (six) hours as needed for moderate pain or severe pain.   pantoprazole 20 MG tablet Commonly known as: Protonix Take 1 tablet (20 mg total) by mouth daily. What changed:  when to take this additional instructions   rifaximin 550 MG Tabs tablet Commonly known as: XIFAXAN Take 1 tablet (550 mg total) by mouth 2 (two) times daily.   spironolactone 100 MG tablet Commonly known as: ALDACTONE Take 1  tablet (100 mg total) by mouth daily. Take 1 tablet with 50 mg tablet (150 mg total) by mouth daily. What changed:  how much to take additional instructions   thiamine 100 MG tablet Commonly known as: VITAMIN B1 Take 1 tablet (100 mg total) by mouth daily.        Follow-up Information     Deland Pretty, MD Follow up.   Specialty: Internal Medicine Contact information: [address] [place] [address] [phone]                Discharge Exam: Danley Danker Weights   05/29/22 0549 05/31/22 0313 06/01/22 0344  Weight: 66.1 kg 66.3 kg 65.1 kg  No distress Mildly tender abdomen without rebound or guarding, reducible wide umbilical hernia.   Condition at discharge: stable  The results of significant diagnostics from this hospitalization (including imaging, microbiology, ancillary and laboratory) are listed below for reference.   Imaging Studies: IR Paracentesis  Result Date: 05/31/2022 INDICATION: Patient with a history of alcoholic cirrhosis with recurrent ascites. Interventional radiology asked to perform a diagnostic and therapeutic paracentesis up to 3 L. EXAM: ULTRASOUND GUIDED PARACENTESIS MEDICATIONS: 1% lidocaine 10 mL COMPLICATIONS: None immediate. PROCEDURE: Informed written consent was obtained from the patient after a discussion of the risks, benefits and alternatives to treatment. A timeout was performed prior to the initiation of the procedure. Initial ultrasound scanning demonstrates a large amount of ascites within the left lower abdominal quadrant. The left lower abdomen was prepped and draped in the usual sterile fashion. 1% lidocaine was used for local anesthesia. Following this, a 19 gauge, 7-cm, Yueh catheter was introduced. An ultrasound image was saved for documentation purposes. The paracentesis was performed. The catheter was removed and a dressing was applied. The patient tolerated the procedure well without immediate post procedural  complication. FINDINGS: A total of approximately 3 L of dark yellow fluid was removed. Samples were sent to the laboratory as requested by the clinical team. IMPRESSION: Successful ultrasound-guided paracentesis yielding 3 liters of peritoneal fluid. Read by: Soyla Dryer, NP PLAN: The patient has previously been formally evaluated by the [facility] Interventional Radiology Portal Hypertension Clinic and is being actively followed for potential future intervention. Patient currently followed by [facility] for possible transplant and TIPS. Next follow-up with [facility] is in November per patient Electronically Signed   By: Jerilynn Mages.  Shick M.D.   On: 05/31/2022 10:26   US Paracentesis  Result Date: 05/26/2022 INDICATION: Patient with history of alcoholic cirrhosis, recurrent ascites, portal hypertensive gastropathy; request received for diagnostic and therapeutic paracentesis. EXAM: ULTRASOUND GUIDED DIAGNOSTIC AND THERAPEUTIC PARACENTESIS MEDICATIONS: 8 ml 1% lidocaine COMPLICATIONS: None immediate. PROCEDURE: Informed written consent was obtained from the patient after a discussion of the risks, benefits and alternatives to treatment. A timeout was performed prior to the initiation of the procedure. Initial ultrasound scanning demonstrates a moderate amount of  ascites within the right lower abdominal quadrant. The right lower abdomen was prepped and draped in the usual sterile fashion. 1% lidocaine was used for local anesthesia. Following this, a 19 gauge, 7-cm, Yueh catheter was introduced. An ultrasound image was saved for documentation purposes. The paracentesis was performed. The catheter was removed and a dressing was applied. The patient tolerated the procedure well without immediate post procedural complication. FINDINGS: A total of approximately 3.1 liters of turbid, yellow fluid was removed. Samples were sent to the laboratory as requested by the clinical team. IMPRESSION: Successful ultrasound-guided diagnostic and  therapeutic paracentesis yielding 3.1 liters of peritoneal fluid. PLAN: The patient has previously been formally evaluated by the [facility] Interventional Radiology Portal Hypertension Clinic and is being actively followed for potential future intervention. Patient currently followed by [facility] for possible transplant and TIPS. Next follow-up with [facility] is in November per patient Read by: Rowe Robert, PA-C Electronically Signed   By: Lucrezia Europe M.D.   On: 05/26/2022 15:24   CT Abdomen Pelvis W Contrast  Result Date: 05/26/2022 CLINICAL DATA:  Abdominal pain.  History of cirrhosis. EXAM: CT ABDOMEN AND PELVIS WITH CONTRAST TECHNIQUE: Multidetector CT imaging of the abdomen and pelvis was performed using the standard protocol following bolus administration of intravenous contrast. RADIATION DOSE REDUCTION: This exam was performed according to the departmental dose-optimization program which includes automated exposure control, adjustment of the mA and/or kV according to patient size and/or use of iterative reconstruction technique. CONTRAST:  61mL OMNIPAQUE IOHEXOL 300 MG/ML  SOLN COMPARISON:  02/19/2022 FINDINGS: Lower chest: No acute abnormality. Hepatobiliary: Shrunken nodular liver compatible with cirrhosis. No focal liver abnormality. Gallbladder appears unremarkable. No gallstones identified. No signs of gallbladder wall thickening. Pancreas: Unremarkable. No pancreatic ductal dilatation or surrounding inflammatory changes. Spleen: Normal in size without focal abnormality. Adrenals/Urinary Tract: Normal adrenal glands. No nephrolithiasis, hydronephrosis or mass. Urinary bladder is unremarkable. Stomach/Bowel: Stomach is normal. Patient is status post appendectomy. No pathologic dilatation of the bowel loops. No bowel wall thickening or inflammation noted. Vascular/Lymphatic: Aortic atherosclerosis. No aneurysm. Upper abdominal collateral vessel formation is noted with recanalization of the umbilical vein.  Portal vein, portal venous confluence, SMV and splenic vein are all patent. No signs of abdominopelvic adenopathy. Reproductive: Prostate is unremarkable. Other: There is a moderate volume of ascites within the abdomen and pelvis, increased from previous exam. No discrete fluid collections. No signs of pneumoperitoneum. Fat containing umbilical hernia measures 6.6 x 3.5 cm, image 67/2. Small amount of fluid and diffuse haziness of the herniated fat is noted. This appears similar to the previous examination from 02/19/2022. Musculoskeletal: No acute or significant osseous findings. IMPRESSION: 1. No acute findings within the abdomen or pelvis. 2. Morphologic features of the liver compatible with cirrhosis. 3. Moderate volume of ascites, increased from previous exam. 4. Fat containing umbilical hernia with small amount of fluid and diffuse haziness of the herniated fat. This appears similar to the previous examination from 02/19/2022. 5.  Aortic Atherosclerosis (ICD10-I70.0). Electronically Signed   By: Kerby Moors M.D.   On: 05/26/2022 07:52   IR Paracentesis  Result Date: 05/20/2022 INDICATION: Cirrhosis, recurrent ascites EXAM: ULTRASOUND GUIDED  PARACENTESIS MEDICATIONS: None. COMPLICATIONS: None immediate. PROCEDURE: Informed written consent was obtained from the patient after a discussion of the risks, benefits and alternatives to treatment. A timeout was performed prior to the initiation of the procedure. Initial ultrasound scanning demonstrates a large amount of ascites within the right lower abdominal quadrant. The right lower abdomen was prepped  and draped in the usual sterile fashion. 1% lidocaine was used for local anesthesia. Following this, a 19 gauge, 7-cm, Yueh catheter was introduced. An ultrasound image was saved for documentation purposes. The paracentesis was performed. The catheter was removed and a dressing was applied. The patient tolerated the procedure well without immediate post  procedural complication. Patient received post-procedure intravenous albumin; see nursing notes for details. FINDINGS: A total of approximately 6.0L of yellow, opaque fluid was removed. Samples were sent to the laboratory as requested by the clinical team. IMPRESSION: Successful ultrasound-guided paracentesis yielding 6.0 liters of peritoneal fluid. PLAN: ** Patient currently followed by [facility] for possible transplant and TIPS. Next follow up with [facility] is in November per patient. ** Electronically Signed   By: Corrie Mckusick D.O.   On: 05/20/2022 16:54   IR Paracentesis  Result Date: 05/04/2022 INDICATION: History of alcoholic cirrhosis with recurrent ascites. Request for diagnostic and therapeutic paracentesis EXAM: ULTRASOUND GUIDED RIGHT LOWER QUADRANT PARACENTESIS MEDICATIONS: 1% plain lidocaine, 5 mL COMPLICATIONS: None immediate. PROCEDURE: Informed written consent was obtained from the patient after a discussion of the risks, benefits and alternatives to treatment. A timeout was performed prior to the initiation of the procedure. Initial ultrasound scanning demonstrates a large amount of ascites within the right lower abdominal quadrant. The right lower abdomen was prepped and draped in the usual sterile fashion. 1% lidocaine was used for local anesthesia. Following this, a 19 gauge, 7-cm, Yueh catheter was introduced. An ultrasound image was saved for documentation purposes. The paracentesis was performed. The catheter was removed and a dressing was applied. The patient tolerated the procedure well without immediate post procedural complication. FINDINGS: A total of approximately 4 L of hazy yellow fluid was removed. Samples were sent to the laboratory as requested by the clinical team. IMPRESSION: Successful ultrasound-guided paracentesis yielding 4 liters of peritoneal fluid. PLAN: The patient has previously been formally evaluated by the [facility] interventional Radiology Portal Hypertension Clinic and  is being actively followed for potential future intervention. ** Patient currently followed by [facility] for possible transplant and TIPS. Next follow up with [facility] is in November per patient. ** Electronically Signed   By: Miachel Roux M.D.   On: 05/04/2022 15:54    Microbiology: Results for orders placed or performed during the hospital encounter of 05/27/22  Blood culture (routine x 2)     Status: None   Collection Time: 05/27/22  4:08 PM   Specimen: BLOOD RIGHT WRIST  Result Value Ref Range Status   Specimen Description BLOOD RIGHT WRIST  Final   Special Requests   Final    BOTTLES DRAWN AEROBIC AND ANAEROBIC Blood Culture results may not be optimal due to an inadequate volume of blood received in culture bottles   Culture   Final    NO GROWTH 5 DAYS Performed at [facility], [place] [address], [place], [place] [address]    Report Status 06/01/2022 FINAL  Final  Blood culture (routine x 2)     Status: None (Preliminary result)   Collection Time: 05/27/22  6:26 PM   Specimen: BLOOD RIGHT FOREARM  Result Value Ref Range Status   Specimen Description BLOOD RIGHT FOREARM  Final   Special Requests   Final    BOTTLES DRAWN AEROBIC AND ANAEROBIC Blood Culture adequate volume   Culture   Final    NO GROWTH 4 DAYS Performed at [facility], [place] [address], [place], [place] [address]    Report Status PENDING  Incomplete  Urine Culture     Status: None   Collection Time: 05/28/22  5:33 AM   Specimen: Urine, Clean Catch  Result Value Ref Range Status   Specimen Description URINE, CLEAN CATCH  Final   Special Requests NONE  Final   Culture   Final    NO GROWTH Performed at [facility], [address] [place], [place] [address]    Report Status 05/29/2022 FINAL  Final  Gram stain     Status: None   Collection Time: 05/31/22 10:41 AM   Specimen: Abdomen; Peritoneal Fluid  Result Value Ref Range Status   Specimen Description PERITONEAL  Final   Special Requests NONE  Final    Gram Stain   Final    NO WBC SEEN NO ORGANISMS SEEN Performed at [facility], [place] [address], [place], [place] [address]    Report Status 05/31/2022 FINAL  Final  Culture, body fluid w Gram Stain-bottle     Status: None (Preliminary result)   Collection Time: 05/31/22 10:42 AM   Specimen: Peritoneal Washings  Result Value Ref Range Status   Specimen Description PERITONEAL  Final   Special Requests NONE  Final   Culture   Final    NO GROWTH < 24 HOURS Performed at [facility], [place] [address] [place], [place] [address]    Report Status PENDING  Incomplete    Labs: CBC: Recent Labs  Lab 05/27/22 1612 05/28/22 0515 05/28/22 1543 05/29/22 0055 05/30/22 0103 05/31/22 0059 06/01/22 0130  WBC 11.2* 6.3  --  6.1 6.1  --  9.2  NEUTROABS 8.2*  --   --  3.8 3.1  --  5.9  HGB 10.4* 8.3* 8.3* 8.1* 8.4* 8.1* 8.0*  HCT 28.3* 23.1* 22.3* 22.1* 23.3* 23.0* 22.0*  MCV 93.7 93.5  --  93.2 95.1  --  95.2  PLT 221 171  --  195 213  --  [id number]   Basic Metabolic Panel: Recent Labs  Lab 05/28/22 0515 05/29/22 0055 05/30/22 0103 05/31/22 0059 06/01/22 0130  NA 124* 130* 133* 133* 132*  K 3.7 3.7 3.7 3.5 3.7  CL 95* 100 104 105 105  CO2 22 21* 21* 21* 21*  GLUCOSE 127* 107* 119* 122* 125*  BUN 29* 31* 26* 20 17  CREATININE 1.86* 1.85* 1.57* 1.52* 1.20  CALCIUM 8.3* 8.2* 8.4* 8.3* 8.0*   Liver Function Tests: Recent Labs  Lab 05/28/22 0515 05/29/22 0055 05/30/22 0103 05/31/22 0059 06/01/22 0130  AST 25 20 19 19 16   ALT 13 11 11 11 9   ALKPHOS 54 51 45 59 48  BILITOT 3.7* 3.6* 4.0* 2.9* 2.9*  PROT 5.0* 4.7* 5.0* 4.7* 4.6*  ALBUMIN 3.3* 3.1* 3.4* 3.2* 3.2*   CBG: Recent Labs  Lab 05/31/22 0600 05/31/22 1133 05/31/22 1605 05/31/22 2114 06/01/22 1155  GLUCAP 124* 133* 113* 165* 122*    Discharge time spent: greater than 30 minutes.  Signed: [name], MD [facility] 06/01/2022

## 2022-06-01 NOTE — Progress Notes (Signed)
Pharmacy Antibiotic Note  Andrew Patton is a 61 y.o. male admitted on 05/27/2022 with decompensated EtOH induced cirrhosis and culture negative SBP s/p ceftriaxone now indicated for long term prophylaxis.  Pharmacy has been consulted for ciprofloxacin dosing.  ClCr 59 ml/min.10/25 peritoneal fluid PMN 5511.   Plan: Cipro 500mg  daily indefinitely  Avoid calcium, iron and magnesium products around ciprofloxacin dose   Height: 5\' 6"  (167.6 cm) Weight: 65.1 kg (143 lb 8 oz) IBW/kg (Calculated) : 63.8  Temp (24hrs), Avg:98.4 F (36.9 C), Min:98 F (36.7 C), Max:98.8 F (37.1 C)  Recent Labs  Lab 05/27/22 1612 05/27/22 1826 05/28/22 0515 05/29/22 0055 05/30/22 0103 05/31/22 0059 06/01/22 0130  WBC 11.2*  --  6.3 6.1 6.1  --  9.2  CREATININE 1.86*  --  1.86* 1.85* 1.57* 1.52* 1.20  LATICACIDVEN 2.8* 2.4*  --   --   --   --   --     Estimated Creatinine Clearance: 59.1 mL/min (by C-G formula based on SCr of 1.2 mg/dL).    Allergies  Allergen Reactions   Crestor [Rosuvastatin Calcium] Other (See Comments)    Elevated liver enzymes   Meloxicam Other (See Comments)    Leg pain   Rosuvastatin Other (See Comments)    Elevated liver enzymes   Penicillins Rash    Has patient had a PCN reaction causing immediate rash, facial/tongue/throat swelling, SOB or lightheadedness with hypotension: No Has patient had a PCN reaction causing severe rash involving mucus membranes or skin necrosis: No Has patient had a PCN reaction that required hospitalization: No Has patient had a PCN reaction occurring within the last 10 years: No If all of the above answers are "NO", then may proceed with Cephalosporin use. Other reaction(s): rash    Antimicrobials this admission: CTX  10/26 >> 10/30  Cipro 10/31 >>   Microbiology results: 10/30 Peritoneal fluid: ngtd 10/26 Bcx ngtd  10/27 Ucx ngtd    Thank you for allowing pharmacy to be a part of this patient's care.  Benetta Spar, PharmD, BCPS,  [facility] Clinical Pharmacist  Please check AMION for all [place] phone numbers After 10:00 PM, call [place] [phone]

## 2022-06-01 NOTE — Progress Notes (Signed)
The first one was for a different patient. Nt. Matilde Markie

## 2022-06-02 LAB — CULTURE, BLOOD (ROUTINE X 2)
Culture: NO GROWTH
Special Requests: ADEQUATE

## 2022-06-02 LAB — PATHOLOGIST SMEAR REVIEW

## 2022-06-04 DIAGNOSIS — K7460 Unspecified cirrhosis of liver: Secondary | ICD-10-CM | POA: Diagnosis not present

## 2022-06-04 DIAGNOSIS — Z23 Encounter for immunization: Secondary | ICD-10-CM | POA: Diagnosis not present

## 2022-06-04 DIAGNOSIS — K7290 Hepatic failure, unspecified without coma: Secondary | ICD-10-CM | POA: Diagnosis not present

## 2022-06-05 LAB — CULTURE, BODY FLUID W GRAM STAIN -BOTTLE: Culture: NO GROWTH

## 2022-06-11 ENCOUNTER — Ambulatory Visit (HOSPITAL_COMMUNITY)
Admission: RE | Admit: 2022-06-11 | Discharge: 2022-06-11 | Disposition: A | Discharge status: Home / Self Care | Payer: Medicare Other | Source: Ambulatory Visit | Attending: Gastroenterology | Admitting: Gastroenterology

## 2022-06-11 DIAGNOSIS — K7031 Alcoholic cirrhosis of liver with ascites: Secondary | ICD-10-CM | POA: Insufficient documentation

## 2022-06-11 HISTORY — PX: IR PARACENTESIS: IMG2679

## 2022-06-11 MED ORDER — ALBUMIN HUMAN 25 % IV SOLN
37.5000 g | Freq: Once | INTRAVENOUS | Status: AC
  Filled 2022-06-11: qty 150

## 2022-06-11 MED ORDER — ALBUMIN HUMAN 25 % IV SOLN
INTRAVENOUS | Status: AC
  Filled 2022-06-11: qty 50

## 2022-06-11 MED ORDER — LIDOCAINE HCL 1 % IJ SOLN
INTRAMUSCULAR | Status: AC
  Filled 2022-06-11: qty 20

## 2022-06-11 MED ORDER — ALBUMIN HUMAN 25 % IV SOLN
INTRAVENOUS | Status: AC
  Administered 2022-06-11: 37.5 g via INTRAVENOUS
  Filled 2022-06-11: qty 50

## 2022-06-11 NOTE — Procedures (Signed)
PROCEDURE SUMMARY:  Successful ultrasound guided paracentesis from the right lower quadrant.  Yielded 6 L of straw colored fluid.  No immediate complications.  The patient tolerated the procedure well.   EBL < 101mL  The patient has previously been formally evaluated by the [facility] Interventional Radiology Portal Hypertension Clinic and is being actively followed for potential future intervention.

## 2022-06-14 NOTE — Telephone Encounter (Signed)
Patient's wife called states patient is experiencing some sort of rash. States she believe it from a medication he is taking. Requesting a call back on [phone]. Please call to advise.

## 2022-06-14 NOTE — Telephone Encounter (Signed)
Hospital admission last month with SBP, treated with Rocephin, then transition to Cipro for SBP prophylaxis.  I believe he then had follow-up with [facility] on 11/3.  Since this could be a potential reaction to Cipro, plan for the following: - Stop Cipro - Start trimethoprim sulfamethoxazole 160 mg / 800 mg daily for continued SBP prophylaxis - I also encourage patient to contact [facility] to make sure they are in the loop  - Evaluation of rash by [facility]

## 2022-06-14 NOTE — Telephone Encounter (Signed)
Spoke with pt's wife. Pt's wife reports that pt started itching when he was in the hospital while he was on IV antibiotics. Pt has itchy small red bumps on his arms, legs, and back that have gradually gotten worse since he started the cipro. Pt also has nausea that started this afternoon and pt vomited once this afternoon. Pt's last dose of oxycodone was 06/02/22.

## 2022-06-15 ENCOUNTER — Other Ambulatory Visit: Payer: Self-pay

## 2022-06-16 MED ORDER — SULFAMETHOXAZOLE-TRIMETHOPRIM 800-160 MG PO TABS: 1.0000 | ORAL_TABLET | Freq: Every day | ORAL | 6 refills | Status: DC

## 2022-06-16 NOTE — Telephone Encounter (Signed)
Left message for pt's wife to call back. Medication sent to pt's pharmacy.

## 2022-06-16 NOTE — Telephone Encounter (Signed)
Spoke with pt's wife and let her know recommendations. Also let pt's wife know that the bactrim was sent to [facility]. Pt's wife verbalized understanding.

## 2022-06-17 ENCOUNTER — Ambulatory Visit (INDEPENDENT_AMBULATORY_CARE_PROVIDER_SITE_OTHER): Payer: Medicare Other | Admitting: Adult Health

## 2022-06-17 ENCOUNTER — Encounter: Payer: Self-pay | Admitting: Adult Health

## 2022-06-17 VITALS — Ht 66.5 in | Wt 152.2 lb

## 2022-06-17 DIAGNOSIS — R4189 Other symptoms and signs involving cognitive functions and awareness: Secondary | ICD-10-CM

## 2022-06-17 DIAGNOSIS — G40209 Localization-related (focal) (partial) symptomatic epilepsy and epileptic syndromes with complex partial seizures, not intractable, without status epilepticus: Secondary | ICD-10-CM | POA: Diagnosis not present

## 2022-06-17 NOTE — Patient Instructions (Signed)
Your Plan:  Continue keppra XR 1000 mg  Restest memory in 3 months  If your symptoms worsen or you develop new symptoms please let us know.    Thank you for coming to see [name] at [facility]. I hope we have been able to provide you high quality care today.  You may receive a patient satisfaction survey over the next few weeks. We would appreciate your feedback and comments so that we may continue to improve ourselves and the health of our patients.

## 2022-06-17 NOTE — Progress Notes (Signed)
PATIENT: Andrew Patton DOB: [birth year]  REASON FOR VISIT: follow up HISTORY FROM: patient PRIMARY NEUROLOGIST:   Chief Complaint  Patient presents with   Follow-up    Rm 4, wife.  In hospital  2 wks ago,for infection had reaction to cipro, itiching now on septra.  Mmse 23/30.      HISTORY OF PRESENT ILLNESS: Today 06/17/22   Andrew Patton is a 61 y.o. male who has been followed in this office for cognitive deficits and seizures. Returns today for follow-up. Wife feels that he has had mild "seizure events." She describes as fogginess (dazed look). Last for seconds- wife states that when she speaks to him he comes out of this. Wife feels that this is due to recent liver issues.   [name] and broke his nose 9/15. Last night he slipped in the tub and hit his head. Not on any blood thinners. Patient wife reports that she has not noticed any residual symptoms after he fell.  Recent infection was in hospital for 2 weeks. Had peritonitis. Now on Batrim.  Patient's wife feels that he is slowly getting back to his baseline.  Still notices trouble with memory. Wife helps with ADLs due to physical limitations. Wife helps with appointments and medications. Not on any memory medication.  Wife feels that his cognition worsened after his hospitalization but is slowly getting better.  HISTORY Dec 10, 2021: Patient is here for follow-up after formal neurocognitive testing per Dr. Tamsen Snider.  I reviewed his notes as below.  Luckily there is no indication of Alzheimer's or Lewy body dementia or other neurodegenerative dementia.  However he does have significant cognitive deficits.  We discussed conservative measures and most of all managing his medical conditions which she is doing very well.  I answered all the questions as to the best of my ability.  He was here with his wife who also provided much information.  Discussed returning to primary care, patient and wife would like to continue following  with [name], we will continue to follow him every 6 months for the next year to ensure his stability.   Dr. Kieth Brightly: Impression/Diagnosis:                     The results of the current neuropsychological evaluation are consistent between the patient's reports and his wife reports about significant cognitive difficulties.  However, the obtained scores were significantly below and worse than the descriptions provided by the patient and his wife.  The patient was so severely impaired during the objective assessment that it would have been very difficult for him to manage even basic ADL type functions around his house.  I do think that it is difficult to assess effectively areas outside of those primarily related to attentional deficits particularly for auditory and visual encoding and focus execute capacity.  These are so impaired that they had a significant impact on all other cognitive areas assessed.  The patient does clearly have significant cognitive deficits with significant memory and attentional deficits present.  This pattern would be consistent with postconcussion syndrome types of deficits but they are will be on what would be expected typically from a concussive event and isolation.  As far as diagnostic considerations, the patient presents with a very complicated medical and neuropsychological picture.  The patient has had multiple concussive events going back over 3 decades.  However, the most changes were noted after a significant fall in September 2021.  The patient began  having seizure-like events that were consistent with complex partial type seizures.  He has responded very well to Keppra as far as reducing these events.  He would have acute confusion, disorientation and balance issues during these times.  While neuro imaging including MRIs did not show any observable abnormalities I suspect that the patient has had some degree of diffuse axonal injury as his cognitive deficits go well beyond  those typically seen with lobar white matter injury slowly.  I do, however, think that there are other issues going on that are contributing and worsening his symptoms although some of which we are not going to be able to avoid.  The patient has responded very well to Keppra and this is decreased his acute seizure/confusional events.  However, due to his anxiety and depressive symptomatology and sleep difficulties he has continued to take benzo diazepam such as Ambien, clonazepam as well as Keppra for his seizures.  The patient has longstanding sleep disturbance but was not found to have sleep apnea.  During one of his acute events at the winery he had an elevated blood glucose level of 242 which is significant although I suspect that his baseline is relatively high to begin with.  Thyroid issues, chronic insomnia, treatment of his anxiety with benzo diazepam, seizures and the focal injury or location contributing to the seizure events are all likely producing the primary complex of symptoms resulting in his subjective experiences.  The primary area of cognitive deficits around attention and focus execute abilities would suggest primary frontal lobe involvement and his deficits in the degree of other areas including temporal lobe functioning are very difficult to ascertain as his attentional deficits are so significant profound that almost all other neuropsychological measures are going to be disproportionately and significantly impacted due to his difficulty keeping instructions and directions straight during various tasks.  As far as treatment recommendations it will be important for the patient to continue to work on his depression and anxiety.  He should also continue close monitoring on metabolic issues including his diabetes and thyroid issues and overall liver function etc.  Whenever possible I would suggest he limit his benzo diazepam use if possible as they may exacerbate some of his memory and  attentional issues.  While clearly his Keppra use is not the cause of his overall cognitive dysfunction and is essential for managing his seizure activity it may also be playing some role in his cognitive issues but his attentional and memory deficits are so significant that they could not be explained by typical side effects from Keppra.   I will sit down with the patient and go over all the results of the current neuropsychological evaluation.  We will address issues related to how to better compensate for his significant attentional issues and encoding deficits and explain how these areas of cognitive functioning are good impact other life details.  Good dietary habits, working on improving his sleep anyway we can and maintaining sustained physical activity will be important overall.  REVIEW OF SYSTEMS: Out of a complete 14 system review of symptoms, the patient complains only of the following symptoms, and all other reviewed systems are negative.  ALLERGIES: Allergies  Allergen Reactions   Crestor [Rosuvastatin Calcium] Other (See Comments)    Elevated liver enzymes   Meloxicam Other (See Comments)    Leg pain   Rosuvastatin Other (See Comments)    Elevated liver enzymes   Penicillins Rash    Has patient had a PCN reaction causing  immediate rash, facial/tongue/throat swelling, SOB or lightheadedness with hypotension: No Has patient had a PCN reaction causing severe rash involving mucus membranes or skin necrosis: No Has patient had a PCN reaction that required hospitalization: No Has patient had a PCN reaction occurring within the last 10 years: No If all of the above answers are "NO", then may proceed with Cephalosporin use. Other reaction(s): rash    HOME MEDICATIONS: Outpatient Medications Prior to Visit  Medication Sig Dispense Refill   busPIRone (BUSPAR) 15 MG tablet Take 7.5-15 mg by mouth 2 (two) times daily. Take 1/2 tablet (7.5 mg) in the morning and Take 1 tablet (15 mg) at  supper     clonazePAM (KLONOPIN) 0.5 MG tablet Take 0.5 mg by mouth at bedtime.     colesevelam (WELCHOL) 625 MG tablet Take 625 mg by mouth daily with breakfast.     escitalopram (LEXAPRO) 5 MG tablet Take 5 mg by mouth daily.     eszopiclone (LUNESTA) 2 MG TABS tablet Take 2 mg by mouth at bedtime. Take immediately before bedtime     ezetimibe (ZETIA) 10 MG tablet Take 10 mg by mouth daily.     fluticasone (FLONASE) 50 MCG/ACT nasal spray Place 1 spray into both nostrils at bedtime.     folic acid (FOLVITE) 1 MG tablet Take 1 mg by mouth at bedtime.     furosemide (LASIX) 80 MG tablet Take 1 tablet (80 mg total) by mouth daily. 30 tablet 3   ipratropium (ATROVENT) 0.06 % nasal spray Place 1 spray into the nose daily as needed for rhinitis.     lactulose (CHRONULAC) 10 GM/15ML solution Take 15 mLs (10 g total) by mouth 2 (two) times daily as needed for mild constipation (for bowel movments 2-3 per day).     levETIRAcetam (KEPPRA XR) 500 MG 24 hr tablet Take 2 tablets (1,000 mg total) by mouth daily. (Patient taking differently: Take 1,000 mg by mouth at bedtime.) 180 tablet 3   levothyroxine (SYNTHROID) 88 MCG tablet Take 88 mcg by mouth every morning.     ondansetron (ZOFRAN ODT) 4 MG disintegrating tablet Take 1 tablet (4 mg total) by mouth every 8 (eight) hours as needed for nausea or vomiting. (Patient taking differently: Take 4 mg by mouth every 8 (eight) hours as needed for nausea or vomiting (dissolve orally).) 6 tablet 0   oxyCODONE (OXY IR/ROXICODONE) 5 MG immediate release tablet Take 1 tablet (5 mg total) by mouth every 6 (six) hours as needed for moderate pain or severe pain. 15 tablet 0   pantoprazole (PROTONIX) 20 MG tablet Take 1 tablet (20 mg total) by mouth daily.     rifaximin (XIFAXAN) 550 MG TABS tablet Take 1 tablet (550 mg total) by mouth 2 (two) times daily. 180 tablet 3   spironolactone (ALDACTONE) 100 MG tablet Take 1 tablet (100 mg total) by mouth daily. Take 1 tablet with  50 mg tablet (150 mg total) by mouth daily. (Patient taking differently: Take 150 mg by mouth daily. Taking 1 & 1/2 tabs = 150 mg daily) 60 tablet 0   sulfamethoxazole-trimethoprim (BACTRIM DS) 800-160 MG tablet Take 1 tablet by mouth daily. 30 tablet 6   thiamine 100 MG tablet Take 1 tablet (100 mg total) by mouth daily. 90 tablet 0   No facility-administered medications prior to visit.    PAST MEDICAL HISTORY: Past Medical History:  Diagnosis Date   Allergy    Anxiety    Arthritis    Ascites  Diabetes (HCC)    Elevated transaminase level    Fatigue    Fatty liver    Hyperlipidemia    Hypertension    "under control", not on any blood pressure medication, has lost 60 lbs   Hypothyroidism    on synthroid   Inguinal muscle strain    Insomnia    Left knee pain    "long time ago"   Multinodular goiter    Obesity    Pneumonia    Psoriasis    Tinnitus     PAST SURGICAL HISTORY: Past Surgical History:  Procedure Laterality Date   APPENDECTOMY  1976   FOOT SURGERY Bilateral 1975   IR PARACENTESIS  11/25/2021   IR PARACENTESIS  12/02/2021   IR PARACENTESIS  12/17/2021   IR PARACENTESIS  01/05/2022   IR PARACENTESIS  02/04/2022   IR PARACENTESIS  02/15/2022   IR PARACENTESIS  03/03/2022   IR PARACENTESIS  03/10/2022   IR PARACENTESIS  03/16/2022   IR PARACENTESIS  03/23/2022   IR PARACENTESIS  03/29/2022   IR PARACENTESIS  04/06/2022   IR PARACENTESIS  04/20/2022   IR PARACENTESIS  05/04/2022   IR PARACENTESIS  05/20/2022   IR PARACENTESIS  05/31/2022   IR PARACENTESIS  06/11/2022   IR RADIOLOGIST EVAL & MGMT  12/17/2021   IR RADIOLOGIST EVAL & MGMT  12/31/2021   NOSE SURGERY     ROTATOR CUFF REPAIR Left    left   SHOULDER SURG[facility] Right [facility]20/2019   [facility]     FAMILY HISTORY: Family History  Problem Relation Age of Onset   Breast cancer Mother    Hyperlipidemia Mother    Other Mother        ? form of parkinson's    Parkinsonism Mother    Bladder Cancer  Father    Parkinson's disease Father    Memory loss Maternal Grandmother        in her 90s   Birth defects Maternal Grandfather    Birth defects Paternal Grandmother    Colon cancer Neg Hx    Esophageal cancer Neg Hx    Rectal cancer Neg Hx    Stomach cancer Neg Hx    Colon polyps Neg Hx    Dementia Neg Hx    Alzheimer's disease Neg Hx     SOCIAL HISTORY: Social History   Socioeconomic History   Marital status: Married    Spouse name: Not on file   Number of children: 2   Years of education: Not on file   Highest education level: Associate degree: academic program  Occupational History   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[employer]  Tobacco Use   Smoking status: Former    Types: Cigarettes    Quit date: 03/27/1994    Years since quitting: 28.2   Smokeless tobacco: Former    Types: Chew   Tobacco comments:    did chew as a teenager  Vaping Use   Vaping Use: Never used  Substance and Sexual Activity   Alcohol use: Not Currently    Alcohol/week: 2.0 standard drinks of alcohol    Types: 2 Glasses of wine per week   Drug use: No   Sexual activity: Not on file  Other Topics Concern   Not on file  Social History Narrative   Lives at home with wife    Right handed   Caffeine: maybe 1-3 cups/week   Social Determinants of Health   Financial Resource Strain: Not on file  Food Insecurity: No  Food Insecurity (06/01/2022)   Hunger Vital Sign    Worried About Running Out of Food in the Last Year: Never true    Ran Out of Food in the Last Year: Never true  Transportation Needs: No Transportation Needs (06/01/2022)   PRAPARE - Administrator, Civil Service (Medical): No    Lack of Transportation (Non-Medical): No  Physical Activity: Not on file  Stress: Not on file  Social Connections: Not on file  Intimate Partner Violence: Not At Risk (06/01/2022)   Humiliation, Afraid, Rape, and Kick questionnaire    Fear of Current or Ex-Partner: No    Emotionally Abused: No    Physically  Abused: No    Sexually Abused: No      PHYSICAL EXAM  Vitals:   06/17/22 0852  Weight: 152 lb 3.2 oz (69 kg)  Height: 5' 6.5" (1.689 m)   Body mass index is 24.2 kg/m.     06/17/2022    9:04 AM 09/04/2020    7:57 AM  MMSE - Mini Mental State Exam  Orientation to time 3 4  Orientation to Place 4 5  Registration 3 3  Attention/ Calculation 2 5  Recall 3 3  Language- name 2 objects 2 2  Language- repeat 1 1  Language- follow 3 step command 3 3  Language- read & follow direction 1 1  Write a sentence 1 1  Copy design 0 1  Total score 23 29     Generalized: Well developed, in no acute distress   Neurological examination  Mentation: Alert oriented to time, place, history taking. Follows all commands speech and language fluent Cranial nerve II-XII: Pupils were equal round reactive to light. Extraocular movements were full, visual field were full on confrontational test. Facial sensation and strength were normal.. Head turning and shoulder shrug  were normal and symmetric. Motor: The motor testing reveals 5 over 5 strength of all 4 extremities. Good symmetric motor tone is noted throughout.  Sensory: Sensory testing is intact to soft touch on all 4 extremities. No evidence of extinction is noted.  Coordination: Cerebellar testing reveals good finger-nose-finger and heel-to-shin bilaterally.  Gait and station: Gait is normal. .  Reflexes: Deep tendon reflexes are symmetric and normal bilaterally.   DIAGNOSTIC DATA (LABS, IMAGING, TESTING) - I reviewed patient records, labs, notes, testing and imaging myself where available.  Lab Results  Component Value Date   WBC 9.2 06/01/2022   HGB 8.0 (L) 06/01/2022   HCT 22.0 (L) 06/01/2022   MCV 95.2 06/01/2022   PLT 230 06/01/2022      Component Value Date/Time   NA 132 (L) 06/01/2022 0130   NA 138 11/13/2020 1153   K 3.7 06/01/2022 0130   CL 105 06/01/2022 0130   CO2 21 (L) 06/01/2022 0130   GLUCOSE 125 (H) 06/01/2022  0130   BUN 17 06/01/2022 0130   BUN 6 11/13/2020 1153   CREATININE 1.20 06/01/2022 0130   CALCIUM 8.0 (L) 06/01/2022 0130   PROT 4.6 (L) 06/01/2022 0130   PROT 7.2 11/13/2020 1153   ALBUMIN 3.2 (L) 06/01/2022 0130   ALBUMIN 4.4 11/13/2020 1153   AST 16 06/01/2022 0130   ALT 9 06/01/2022 0130   ALKPHOS 48 06/01/2022 0130   BILITOT 2.9 (H) 06/01/2022 0130   BILITOT 2.6 (H) 11/13/2020 1153   GFRNONAA >60 06/01/2022 0130   GFRAA 108 09/04/2020 0908   No results found for: "CHOL", "HDL", "LDLCALC", "LDLDIRECT", "TRIG", "CHOLHDL" Lab Results  Component  Value Date   HGBA1C 5.2 05/28/2022   Lab Results  Component Value Date   VITAMINB12 1,018 (H) 05/31/2022   Lab Results  Component Value Date   TSH 1.162 02/21/2022      ASSESSMENT AND PLAN 61 y.o. year old male  has a past medical history of Allergy, Anxiety, Arthritis, Ascites, Diabetes (HCC), Elevated transaminase level, Fatigue, Fatty liver, Hyperlipidemia, Hypertension, Hypothyroidism, Inguinal muscle strain, Insomnia, Left knee pain, Multinodular goiter, Obesity, Pneumonia, Psoriasis, and Tinnitus. here with :  Cognitive deficits  Most likely worsened after hospitalization but patient is slowly getting back to baseline.  We will continue to monitor for now.  Patient will come back in 3 months and we will repeat memory testing   2. Seizures  Continue Keppra XR 1000 mg daily.  Discussed potentially increasing the medication as wife had concerns that he was having "mild seizures."  However the patient and his wife would like to monitor his symptoms for the next 3 months before increasing medication.  Advised that if he has any seizure events they should let us know and we will increase medicine to 1500 mg daily.  Patient is not operating a motor vehicle  Follow-up in 3 to 4 months or sooner if needed   Butch Penny, MSN, NP-C 06/17/2022, 7:49 AM [facility] [address] [place], [place]  [address] [phone]

## 2022-06-18 ENCOUNTER — Emergency Department (HOSPITAL_COMMUNITY): Payer: Medicare Other

## 2022-06-18 ENCOUNTER — Ambulatory Visit (HOSPITAL_COMMUNITY)
Admission: RE | Admit: 2022-06-18 | Discharge: 2022-06-18 | Disposition: A | Discharge status: Home / Self Care | Payer: Medicare Other | Source: Ambulatory Visit | Attending: Gastroenterology | Admitting: Gastroenterology

## 2022-06-18 ENCOUNTER — Emergency Department (HOSPITAL_COMMUNITY)
Admission: EM | Admit: 2022-06-18 | Discharge: 2022-06-18 | Disposition: A | Discharge status: Home / Self Care | Payer: Medicare Other | Attending: Emergency Medicine | Admitting: Emergency Medicine

## 2022-06-18 ENCOUNTER — Encounter (HOSPITAL_COMMUNITY): Payer: Self-pay

## 2022-06-18 ENCOUNTER — Other Ambulatory Visit: Payer: Self-pay

## 2022-06-18 DIAGNOSIS — M25552 Pain in left hip: Secondary | ICD-10-CM | POA: Insufficient documentation

## 2022-06-18 DIAGNOSIS — E039 Hypothyroidism, unspecified: Secondary | ICD-10-CM | POA: Insufficient documentation

## 2022-06-18 DIAGNOSIS — R531 Weakness: Secondary | ICD-10-CM | POA: Diagnosis not present

## 2022-06-18 DIAGNOSIS — K7031 Alcoholic cirrhosis of liver with ascites: Secondary | ICD-10-CM | POA: Insufficient documentation

## 2022-06-18 DIAGNOSIS — I10 Essential (primary) hypertension: Secondary | ICD-10-CM | POA: Insufficient documentation

## 2022-06-18 DIAGNOSIS — S81012A Laceration without foreign body, left knee, initial encounter: Secondary | ICD-10-CM | POA: Diagnosis not present

## 2022-06-18 DIAGNOSIS — G319 Degenerative disease of nervous system, unspecified: Secondary | ICD-10-CM | POA: Diagnosis not present

## 2022-06-18 DIAGNOSIS — S50312A Abrasion of left elbow, initial encounter: Secondary | ICD-10-CM | POA: Insufficient documentation

## 2022-06-18 DIAGNOSIS — M5030 Other cervical disc degeneration, unspecified cervical region: Secondary | ICD-10-CM | POA: Diagnosis not present

## 2022-06-18 DIAGNOSIS — R296 Repeated falls: Secondary | ICD-10-CM | POA: Diagnosis not present

## 2022-06-18 DIAGNOSIS — M25512 Pain in left shoulder: Secondary | ICD-10-CM | POA: Diagnosis not present

## 2022-06-18 DIAGNOSIS — M25522 Pain in left elbow: Secondary | ICD-10-CM | POA: Diagnosis not present

## 2022-06-18 DIAGNOSIS — S0990XA Unspecified injury of head, initial encounter: Secondary | ICD-10-CM | POA: Diagnosis not present

## 2022-06-18 DIAGNOSIS — E119 Type 2 diabetes mellitus without complications: Secondary | ICD-10-CM | POA: Diagnosis not present

## 2022-06-18 DIAGNOSIS — W010XXA Fall on same level from slipping, tripping and stumbling without subsequent striking against object, initial encounter: Secondary | ICD-10-CM | POA: Insufficient documentation

## 2022-06-18 DIAGNOSIS — K469 Unspecified abdominal hernia without obstruction or gangrene: Secondary | ICD-10-CM | POA: Insufficient documentation

## 2022-06-18 DIAGNOSIS — W19XXXA Unspecified fall, initial encounter: Secondary | ICD-10-CM

## 2022-06-18 DIAGNOSIS — S199XXA Unspecified injury of neck, initial encounter: Secondary | ICD-10-CM | POA: Diagnosis not present

## 2022-06-18 DIAGNOSIS — S51012A Laceration without foreign body of left elbow, initial encounter: Secondary | ICD-10-CM | POA: Diagnosis not present

## 2022-06-18 DIAGNOSIS — R569 Unspecified convulsions: Secondary | ICD-10-CM | POA: Diagnosis not present

## 2022-06-18 DIAGNOSIS — I700 Atherosclerosis of aorta: Secondary | ICD-10-CM | POA: Diagnosis not present

## 2022-06-18 DIAGNOSIS — Z79899 Other long term (current) drug therapy: Secondary | ICD-10-CM | POA: Diagnosis not present

## 2022-06-18 DIAGNOSIS — M25562 Pain in left knee: Secondary | ICD-10-CM | POA: Diagnosis not present

## 2022-06-18 DIAGNOSIS — R188 Other ascites: Secondary | ICD-10-CM | POA: Diagnosis not present

## 2022-06-18 HISTORY — PX: IR PARACENTESIS: IMG2679

## 2022-06-18 LAB — CBC WITH DIFFERENTIAL/PLATELET
Abs Immature Granulocytes: 0.04 10*3/uL (ref 0.00–0.07)
Basophils Absolute: 0.1 10*3/uL (ref 0.0–0.1)
Basophils Relative: 1 %
Eosinophils Absolute: 1 10*3/uL — ABNORMAL HIGH (ref 0.0–0.5)
Eosinophils Relative: 10 %
HCT: 24.7 % — ABNORMAL LOW (ref 39.0–52.0)
Hemoglobin: 9 g/dL — ABNORMAL LOW (ref 13.0–17.0)
Immature Granulocytes: 0 %
Lymphocytes Relative: 13 %
Lymphs Abs: 1.2 10*3/uL (ref 0.7–4.0)
MCH: 35 pg — ABNORMAL HIGH (ref 26.0–34.0)
MCHC: 36.4 g/dL — ABNORMAL HIGH (ref 30.0–36.0)
MCV: 96.1 fL (ref 80.0–100.0)
Monocytes Absolute: 1.3 10*3/uL — ABNORMAL HIGH (ref 0.1–1.0)
Monocytes Relative: 13 %
Neutro Abs: 6 10*3/uL (ref 1.7–7.7)
Neutrophils Relative %: 63 %
Platelets: 290 10*3/uL (ref 150–400)
RBC: 2.57 MIL/uL — ABNORMAL LOW (ref 4.22–5.81)
RDW: 16.8 % — ABNORMAL HIGH (ref 11.5–15.5)
WBC: 9.6 10*3/uL (ref 4.0–10.5)
nRBC: 0 % (ref 0.0–0.2)

## 2022-06-18 LAB — BODY FLUID CELL COUNT WITH DIFFERENTIAL
Lymphs, Fluid: 58 %
Monocyte-Macrophage-Serous Fluid: 35 % — ABNORMAL LOW (ref 50–90)
Neutrophil Count, Fluid: 6 % (ref 0–25)
Total Nucleated Cell Count, Fluid: 84 cu mm (ref 0–1000)

## 2022-06-18 LAB — PROTIME-INR
INR: 1.5 — ABNORMAL HIGH (ref 0.8–1.2)
Prothrombin Time: 17.7 seconds — ABNORMAL HIGH (ref 11.4–15.2)

## 2022-06-18 MED ORDER — LIDOCAINE HCL 1 % IJ SOLN
INTRAMUSCULAR | Status: AC
  Administered 2022-06-18: 8 mL
  Filled 2022-06-18: qty 20

## 2022-06-18 MED ORDER — OXYCODONE HCL 5 MG PO TABS: 5.0000 mg | ORAL_TABLET | Freq: Four times a day (QID) | ORAL | 0 refills | Status: AC | PRN

## 2022-06-18 NOTE — ED Triage Notes (Signed)
Pt arrived POV from home c/o having 2 falls yesterday. Pt states he is unsure if he hit his head. Pt is also c/o left elbow and left hip pain. Pt denies taking a blood thinner.

## 2022-06-18 NOTE — Discharge Instructions (Signed)
You were seen in the ED today after a fall. Your x rays returned without any fractures or serious injuries.  Please schedule a follow up visit with your PCP for further management.

## 2022-06-18 NOTE — Progress Notes (Signed)
Andrew Patton presented for his scheduled paracentesis reporting pain and bruising of his left hip and arm.  He says he experienced a ground level fall last evening and landed on his left side.  He was able to get up with assistance but has been in persistent pain ever since.   Offered evaluation in ED after paracentesis and he desires to have this done.  Patient and wife to be escorted to ED.  Information given to Chestine Spore, RN in ED.  Electronically Signed: Sheliah Plane, PA-C 06/18/2022, 10:26 AM

## 2022-06-18 NOTE — ED Provider Triage Note (Signed)
Emergency Medicine Provider Triage Evaluation Note  Andrew Patton , a 61 y.o. male  was evaluated in triage.  Pt complains of chief complaint of fall.  He is 61 year old male with a history of cirrhosis.  He endorses 2 ground-level falls over the past 3 days.  That usually in the evening when he gets up at night.  He fell in the bathtub 3 days ago and last night getting out of bed.  He has recently been started on Benadryl for her rash.  He is taking it at night around bedtime. He endorses left elbow pain, intermittent headache, left hip pain, left knee pain.    Review of Systems  Positive: falls Negative: Fever chills, nausea vomiting, syncope or shortness of breath  Physical Exam  BP (!) 111/51 (BP Location: Left Arm)   Pulse 69   Temp 98.4 F (36.9 C) (Oral)   Resp 18   Ht 5' 6.5" (1.689 m)   Wt 68 kg   SpO2 100%   BMI 23.85 kg/m  Gen:   Awake, no distress   Resp:  Normal effort  MSK:   Moves extremities without difficulty  Medical Decision Making  Medically screening exam initiated at 11:26 AM.  Appropriate orders placed.  [name] was informed that the remainder of the evaluation will be completed by another provider, this initial triage assessment does not replace that evaluation, and the importance of remaining in the ED until their evaluation is complete.     Glyn Ade, MD 06/18/22 [phone]

## 2022-06-18 NOTE — ED Provider Notes (Signed)
[facility] EMERGENCY DEPARTMENT Provider Note   CSN: [id number] Arrival date & time: 06/18/22  1037     History Past medical history of hypertension, hyperlipidemia, obesity, hypothyroidism, diabetes, cirrhosis Chief Complaint  Patient presents with   Andrew Patton is a 61 y.o. male.  Patient presents with chief complaint of fall.  He says 3 days ago he slipped on the bathtub and fell on his left side.  He tripped again the next day while opening the fridge and turning the wrong way.  He does not member hitting his head or passing out either time he fell.  He is complaining of mostly pain on his left side.  Specifically the left shoulder and elbow, and left hip.  He has been ambulating and putting weight on his left side, but is having more difficulty with it than normal.   Fall       Home Medications Prior to Admission medications   Medication Sig Start Date End Date Taking? Authorizing Provider  busPIRone (BUSPAR) 15 MG tablet Take 7.5-15 mg by mouth 2 (two) times daily. Take 1/2 tablet (7.5 mg) in the morning and Take 1 tablet (15 mg) at supper    [provider]  clonazePAM (KLONOPIN) 0.5 MG tablet Take 0.5 mg by mouth at bedtime.    [provider]  colesevelam (WELCHOL) 625 MG tablet Take 625 mg by mouth daily with breakfast.    [provider]  escitalopram (LEXAPRO) 5 MG tablet Take 5 mg by mouth daily. 02/14/22   [provider]  eszopiclone (LUNESTA) 2 MG TABS tablet Take 2 mg by mouth at bedtime. Take immediately before bedtime    [provider]  ezetimibe (ZETIA) 10 MG tablet Take 10 mg by mouth daily.    [provider]  fluticasone (FLONASE) 50 MCG/ACT nasal spray Place 1 spray into both nostrils at bedtime. 01/05/13   [provider]  folic acid (FOLVITE) 1 MG tablet Take 1 mg by mouth at bedtime.    [provider]  furosemide (LASIX) 80 MG tablet Take 1 tablet (80 mg  total) by mouth daily. 05/17/22   Cirigliano, Vito V, DO  ipratropium (ATROVENT) 0.06 % nasal spray Place 1 spray into the nose daily as needed for rhinitis. 02/17/19   [provider]  lactulose (CHRONULAC) 10 GM/15ML solution Take 15 mLs (10 g total) by mouth 2 (two) times daily as needed for mild constipation (for bowel movments 2-3 per day). 07/06/21   Pokhrel, Rebekah Chesterfield, MD  levETIRAcetam (KEPPRA XR) 500 MG 24 hr tablet Take 2 tablets (1,000 mg total) by mouth daily. Patient taking differently: Take 1,000 mg by mouth at bedtime. 12/10/21   Anson Fret, MD  levothyroxine (SYNTHROID) 88 MCG tablet Take 88 mcg by mouth every morning. 02/14/22   [provider]  ondansetron (ZOFRAN ODT) 4 MG disintegrating tablet Take 1 tablet (4 mg total) by mouth every 8 (eight) hours as needed for nausea or vomiting. Patient taking differently: Take 4 mg by mouth every 8 (eight) hours as needed for nausea or vomiting (dissolve orally). 02/08/20   Maxwell Caul, PA-C  oxyCODONE (OXY IR/ROXICODONE) 5 MG immediate release tablet Take 1 tablet (5 mg total) by mouth every 6 (six) hours as needed for up to 5 days for moderate pain or severe pain. 06/18/22 06/23/22  Giannamarie Paulus, Finis Bud, PA-C  pantoprazole (PROTONIX) 20 MG tablet Take 1 tablet (20 mg total) by mouth daily.  06/01/22   [name], MD  rifaximin (XIFAXAN) 550 MG TABS tablet Take 1 tablet (550 mg total) by mouth 2 (two) times daily. 12/15/21   Cirigliano, Vito V, DO  spironolactone (ALDACTONE) 100 MG tablet Take 1 tablet (100 mg total) by mouth daily. Take 1 tablet with 50 mg tablet (150 mg total) by mouth daily. Patient taking differently: Take 150 mg by mouth daily. Taking 1 & 1/2 tabs = 150 mg daily 12/08/21   Cirigliano, Vito V, DO  sulfamethoxazole-trimethoprim (BACTRIM DS) 800-160 MG tablet Take 1 tablet by mouth daily. 06/16/22   Cirigliano, Vito V, DO  thiamine 100 MG tablet Take 1 tablet (100 mg total) by mouth daily. 07/06/21    Pokhrel, Corrie Mckusick, MD  zinc sulfate 220 (50 Zn) MG capsule Take by mouth. 06/04/22 06/04/23  [provider]      Allergies    Fentanyl, Crestor [rosuvastatin calcium], Meloxicam, Rosuvastatin, Ciprofloxacin, and Penicillins    Review of Systems   Review of Systems  Musculoskeletal:  Positive for arthralgias.  All other systems reviewed and are negative.   Physical Exam Updated Vital Signs BP (!) 111/51 (BP Location: Left Arm)   Pulse 69   Temp 98.4 F (36.9 C) (Oral)   Resp 18   Ht 5' 6.5" (1.689 m)   Wt 68 kg   SpO2 100%   BMI 23.85 kg/m  Physical Exam Vitals and nursing note reviewed.  Constitutional:      General: He is not in acute distress.    Appearance: Normal appearance. He is well-developed. He is not ill-appearing, toxic-appearing or diaphoretic.  HENT:     Head: Normocephalic and atraumatic.     Nose: No nasal deformity.     Mouth/Throat:     Lips: Pink. No lesions.  Eyes:     General: Gaze aligned appropriately. No scleral icterus.       Right eye: No discharge.        Left eye: No discharge.     Conjunctiva/sclera: Conjunctivae normal.     Right eye: Right conjunctiva is not injected. No exudate or hemorrhage.    Left eye: Left conjunctiva is not injected. No exudate or hemorrhage. Cardiovascular:     Rate and Rhythm: Normal rate and regular rhythm.     Pulses: Normal pulses.     Heart sounds: Normal heart sounds.  Pulmonary:     Effort: Pulmonary effort is normal. No respiratory distress.     Breath sounds: Normal breath sounds.  Abdominal:     General: Abdomen is flat. There is no distension.     Palpations: Abdomen is soft.     Tenderness: There is no abdominal tenderness. There is no right CVA tenderness, left CVA tenderness, guarding or rebound.     Hernia: A hernia is present.  Musculoskeletal:     Comments: Normal range of motion of the left shoulder with some tenderness to palpation at the humeral head.  Left elbow with multiple  superficial abrasions.  Normal range of motion.  Tenderness to touch at the olecranon.  Left hip with pain doing ROM, no deformity  No pain, bruising, or deformity of left rib cage/abdomen.  No spinal tenderness  Skin:    General: Skin is warm and dry.  Neurological:     Mental Status: He is alert and oriented to person, place, and time.  Psychiatric:        Mood and Affect: Mood normal.        Speech:  Speech normal.        Behavior: Behavior normal. Behavior is cooperative.     ED Results / Procedures / Treatments   Labs (all labs ordered are listed, but only abnormal results are displayed) Labs Reviewed  CBC WITH DIFFERENTIAL/PLATELET - Abnormal; Notable for the following components:      Result Value   RBC 2.57 (*)    Hemoglobin 9.0 (*)    HCT 24.7 (*)    MCH 35.0 (*)    MCHC 36.4 (*)    RDW 16.8 (*)    Monocytes Absolute 1.3 (*)    Eosinophils Absolute 1.0 (*)    All other components within normal limits  PROTIME-INR - Abnormal; Notable for the following components:   Prothrombin Time 17.7 (*)    INR 1.5 (*)    All other components within normal limits    EKG None  Radiology DG Shoulder Left  Result Date: 06/18/2022 CLINICAL DATA:  Fall.  Left shoulder pain. EXAM: LEFT SHOULDER - 2+ VIEW COMPARISON:  None Available. FINDINGS: No acute fracture or dislocation is identified. No significant arthropathy is evident. The soft tissues are unremarkable. IMPRESSION: Negative. Electronically Signed   By: Logan Bores M.D.   On: 06/18/2022 14:45   CT HEAD WO CONTRAST (5MM)  Result Date: 06/18/2022 CLINICAL DATA:  Head trauma, fall, fell twice yesterday uncertain if struck head EXAM: CT HEAD WITHOUT CONTRAST CT CERVICAL SPINE WITHOUT CONTRAST TECHNIQUE: Multidetector CT imaging of the head and cervical spine was performed following the standard protocol without intravenous contrast. Multiplanar CT image reconstructions of the cervical spine were also generated. RADIATION  DOSE REDUCTION: This exam was performed according to the departmental dose-optimization program which includes automated exposure control, adjustment of the mA and/or kV according to patient size and/or use of iterative reconstruction technique. COMPARISON:  CT head 02/08/2020 FINDINGS: CT HEAD FINDINGS Brain: Mild generalized atrophy. Normal ventricular morphology. No midline shift or mass effect. 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 No intracranial hemorrhage or evidence of acute infarction. No extra-axial fluid collections. Calcified mass RIGHT vertex question calcified meningioma versus dural calcification 8 mm diameter unchanged. Vascular: No hyperdense vessels. Skull: Calvaria intact. Sclerotic focus within clivus unchanged since 10/08/2008. Sinuses/Orbits: Clear Other: N/A CT CERVICAL SPINE FINDINGS Alignment: Normal Skull base and vertebrae: Osseous demineralization. Skull base intact. Vertebral body heights maintained. No fracture, subluxation, or bone destruction. Disc space narrowing and endplate spur formation C5-C6 and C6-C7. Scattered multilevel facet degenerative changes. Soft tissues and spinal canal: Prevertebral soft tissues normal thickness. 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 No acute intracranial abnormalities. Stable 8 mm dural calcification versus meningioma RIGHT vertex. Degenerative disc and facet disease changes of the cervical spine. No acute cervical spine abnormalities. Electronically Signed   By: Lavonia Dana M.D.   On: 06/18/2022 12:16   CT CERVICAL SPINE WO CONTRAST  Result Date: 06/18/2022 CLINICAL DATA:  Head trauma, fall, fell twice yesterday uncertain if struck head EXAM: CT HEAD WITHOUT CONTRAST CT CERVICAL SPINE WITHOUT CONTRAST TECHNIQUE: Multidetector CT imaging of the head and cervical spine was performed following the standard protocol without  intravenous contrast. Multiplanar CT image reconstructions of the cervical spine were also generated. RADIATION DOSE REDUCTION: This exam was performed according to the departmental dose-optimization program which includes automated exposure control, adjustment of the mA and/or kV according to patient size and/or use of iterative reconstruction technique. COMPARISON:  CT  head 02/08/2020 FINDINGS: CT HEAD FINDINGS Brain: Mild generalized atrophy. Normal ventricular morphology. No midline shift or mass effect. 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 No intracranial hemorrhage or evidence of acute infarction. No extra-axial fluid collections. Calcified mass RIGHT vertex question calcified meningioma versus dural calcification 8 mm diameter unchanged. Vascular: No hyperdense vessels. Skull: Calvaria intact. Sclerotic focus within clivus unchanged since 10/08/2008. Sinuses/Orbits: Clear Other: N/A CT CERVICAL SPINE FINDINGS Alignment: Normal Skull base and vertebrae: Osseous demineralization. Skull base intact. Vertebral body heights maintained. No fracture, subluxation, or bone destruction. Disc space narrowing and endplate spur formation C5-C6 and C6-C7. Scattered multilevel facet degenerative changes. Soft tissues and spinal canal: Prevertebral soft tissues normal thickness. 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 No acute intracranial abnormalities. Stable 8 mm dural calcification versus meningioma RIGHT vertex. Degenerative disc and facet disease changes of the cervical spine. No acute cervical spine abnormalities. Electronically Signed   By: Lavonia Dana M.D.   On: 06/18/2022 12:16   DG Pelvis 1-2 Views  Result Date: 06/18/2022 CLINICAL DATA:  [place] down today, weakness EXAM: PELVIS - 1-2 VIEW COMPARISON:  CT abdomen and pelvis 05/26/2022, 03/07/2019 FINDINGS: Osseous mineralization normal. Hip  joint space narrowing greater on LEFT. SI joints preserved. Sclerotic focus at RIGHT femoral neck unchanged since 03/07/2019, question enchondroma. No fracture, dislocation, or bone destruction. IMPRESSION: No acute osseous abnormalities. Electronically Signed   By: Lavonia Dana M.D.   On: 06/18/2022 12:06   DG Chest 1 View  Result Date: 06/18/2022 CLINICAL DATA:  [place] today, history of multiple falls, seizures, weakness EXAM: CHEST  1 VIEW COMPARISON:  08/05/2021 FINDINGS: Upper normal heart size. Mediastinal contours and pulmonary vascularity normal. Atherosclerotic calcification aorta. Questionable nodular density versus nipple shadow RIGHT lung base. Lungs otherwise clear. No pulmonary infiltrate, pleural effusion, or pneumothorax. No acute osseous findings. IMPRESSION: Question nodular density versus nipple shadow at RIGHT lung base; repeat PA chest radiograph with nipple markers recommended to exclude pulmonary nodule. Aortic Atherosclerosis (ICD10-I70.0). Electronically Signed   By: Lavonia Dana M.D.   On: 06/18/2022 12:05   DG Elbow Complete Left  Result Date: 06/18/2022 CLINICAL DATA:  [place] today, history of multiple falls, seizures, laceration posterior LEFT elbow, weakness EXAM: LEFT ELBOW - COMPLETE 3+ VIEW COMPARISON:  None Available. FINDINGS: Osseous mineralization normal. Joint spaces preserved. No acute fracture, dislocation, or bone destruction. No joint effusion. Soft tissue swelling dorsal to the elbow/olecranon with foci of soft tissue gas consistent with history of laceration. No radiopaque foreign body. IMPRESSION: No acute osseous abnormalities. Electronically Signed   By: Lavonia Dana M.D.   On: 06/18/2022 12:03   DG Knee 2 Views Left  Result Date: 06/18/2022 CLINICAL DATA:  [place] today, history of multiple falls, seizures, laceration anterior LEFT knee, weakness EXAM: LEFT KNEE - 1-2 VIEW COMPARISON:  04/29/2014 FINDINGS: Osseous mineralization normal. Joint spaces preserved. No  acute fracture, dislocation, or bone destruction. No significant joint effusion or radiopaque foreign bodies. IMPRESSION: No acute abnormalities. Electronically Signed   By: Lavonia Dana M.D.   On: 06/18/2022 12:02   IR Paracentesis  Result Date: 06/18/2022 INDICATION: Alcoholic cirrhosis with recurrent ascites EXAM: ULTRASOUND GUIDED RIGHT PARACENTESIS MEDICATIONS: None. COMPLICATIONS: None immediate. PROCEDURE: Informed written consent was obtained from the patient after a discussion of the risks, benefits and alternatives to treatment. A timeout was performed prior to the initiation of the procedure. Initial ultrasound scanning demonstrates a  large amount of ascites within the right lower abdominal quadrant. The right lower abdomen was prepped and draped in the usual sterile fashion. 1% lidocaine was used for local anesthesia. Following this, a 19 gauge, 7-cm, Yueh catheter was introduced. An ultrasound image was saved for documentation purposes. The paracentesis was performed. The catheter was removed and a dressing was applied. The patient tolerated the procedure well without immediate post procedural complication. FINDINGS: A total of approximately 4L of minimally cloudy ascitic fluid was removed. Samples were sent to the laboratory. IMPRESSION: Successful ultrasound-guided paracentesis yielding 4L liters of peritoneal fluid. PLAN: The patient has previously been formally evaluated by the [facility] and is being actively followed for potential future intervention. Electronically Signed   By: Jacqulynn Cadet M.D.   On: 06/18/2022 10:52    Procedures Procedures   Medications Ordered in ED Medications - No data to display  ED Course/ Medical Decision Making/ A&P                           Medical Decision Making Amount and/or Complexity of Data Reviewed Radiology: ordered.  Risk Prescription drug management.   Patient is presenting after  mechanical fall that occurred 2 days ago.  There is no concern for any syncopal episode or head injury.  He did have CT head and cervical spine imaging which were normal.  He also had x-rays of his chest, pelvis, elbow, knee, and shoulder.  These all were normal without any acute fractures or other injuries.  Patient has demonstrated ability to mobilize safely.  I have prescribed him oxycodone for pain Tylenol and ibuprofen are contraindicated. Return precautions provided. Recommend f/u with PCP  Final Clinical Impression(s) / ED Diagnoses Final diagnoses:  Fall, initial encounter    Rx / DC Orders ED Discharge Orders          Ordered    oxyCODONE (OXY IR/ROXICODONE) 5 MG immediate release tablet  Every 6 hours PRN        06/18/22 1457              Fabrice Dyal, Adora Fridge, PA-C 06/18/22 1500    Lajean Saver, MD 06/19/22 [phone]

## 2022-06-18 NOTE — Procedures (Signed)
PROCEDURE SUMMARY:  Successful ultrasound guided paracentesis from the right lower quadrant  Yielded 4L of ascitic fluid.  No immediate complications.  The patient tolerated the procedure well.   Specimen was sent for labs.  EBL < 20mL  The patient has previously been formally evaluated by the [facility] and is being actively followed for potential future intervention.  Electronically Signed: Sheliah Plane, PA-C 06/18/2022, 10:27 AM

## 2022-06-21 LAB — BODY FLUID CULTURE W GRAM STAIN: Culture: NO GROWTH

## 2022-06-22 ENCOUNTER — Other Ambulatory Visit (HOSPITAL_COMMUNITY): Payer: Self-pay | Admitting: Gastroenterology

## 2022-06-22 ENCOUNTER — Encounter (HOSPITAL_COMMUNITY): Payer: Self-pay | Admitting: Gastroenterology

## 2022-06-22 DIAGNOSIS — R188 Other ascites: Secondary | ICD-10-CM

## 2022-06-22 LAB — CYTOLOGY - NON PAP

## 2022-06-25 ENCOUNTER — Ambulatory Visit (HOSPITAL_COMMUNITY)
Admission: RE | Admit: 2022-06-25 | Discharge: 2022-06-25 | Disposition: A | Discharge status: Home / Self Care | Payer: Medicare Other | Source: Ambulatory Visit | Attending: Gastroenterology | Admitting: Gastroenterology

## 2022-06-25 DIAGNOSIS — R188 Other ascites: Secondary | ICD-10-CM | POA: Insufficient documentation

## 2022-06-25 DIAGNOSIS — K7031 Alcoholic cirrhosis of liver with ascites: Secondary | ICD-10-CM | POA: Diagnosis not present

## 2022-06-25 HISTORY — PX: IR PARACENTESIS: IMG2679

## 2022-06-25 MED ORDER — LIDOCAINE HCL 1 % IJ SOLN
INTRAMUSCULAR | Status: AC
  Administered 2022-06-25: 10 mL
  Filled 2022-06-25: qty 20

## 2022-06-25 NOTE — Procedures (Signed)
PROCEDURE SUMMARY:  Successful image-guided paracentesis from the right lower abdomen. Following initial placement and drainage of ~1L of fluid, patient accidentally self-removed catheter. A successful image-guided paracentesis from the left lower abdomen was subsequently performed.  Yielded 5 liters total of clear yellow fluid.  No immediate complications.  EBL = trace. Patient tolerated well.   Specimen was not sent for labs.  Please see imaging section of Epic for full dictation.   Kennieth Francois PA-C 06/25/2022 11:09 AM

## 2022-06-28 ENCOUNTER — Other Ambulatory Visit (HOSPITAL_COMMUNITY): Payer: Self-pay | Admitting: Gastroenterology

## 2022-06-28 ENCOUNTER — Other Ambulatory Visit: Payer: Self-pay

## 2022-06-28 ENCOUNTER — Telehealth: Payer: Self-pay | Admitting: Gastroenterology

## 2022-06-28 DIAGNOSIS — R188 Other ascites: Secondary | ICD-10-CM

## 2022-06-28 NOTE — Telephone Encounter (Signed)
Patients wife called asking if we can place lab orders for the patient put in, [facility] advise them it is time to redo. Also seeking paracentesis results.

## 2022-06-28 NOTE — Telephone Encounter (Signed)
Pt wife stated that Dr. Sue Lush from [facility] wanted pt to have a metabolic panel to check kidney function this week because of the number of paracenteses the pt has had. They wanted to have lab work here because it would be closer. Pt's wife also wanted to let Dr. Barron Alvine know that pt was approved for evaluation for a liver transplant on December 11th, 12th and 13th.  Pt's wife also stated that pt had a paracentesis on Friday 06/25/22 and wanted to know if there were any results from that paracentesis or why labs were not drawn.

## 2022-06-28 NOTE — Telephone Encounter (Signed)
Order placed for BMP. Let patient's wife know message. Pt's wife verbalized understanding.

## 2022-06-28 NOTE — Telephone Encounter (Signed)
Ok to order BMP here and will review those results, which should be viewable by his Hepatologist at [facility] as well.   Not sure why labs were not sent with the paracentesis on 11/24. The one prior on 11/17 was w/o infection.   Very happy that he is approved for liver transplant eval at [facility]!

## 2022-06-29 DIAGNOSIS — K7210 Chronic hepatic failure without coma: Secondary | ICD-10-CM | POA: Diagnosis not present

## 2022-06-29 DIAGNOSIS — M8589 Other specified disorders of bone density and structure, multiple sites: Secondary | ICD-10-CM | POA: Diagnosis not present

## 2022-06-29 DIAGNOSIS — L299 Pruritus, unspecified: Secondary | ICD-10-CM | POA: Diagnosis not present

## 2022-06-29 DIAGNOSIS — R5381 Other malaise: Secondary | ICD-10-CM | POA: Diagnosis not present

## 2022-06-29 DIAGNOSIS — S022XXA Fracture of nasal bones, initial encounter for closed fracture: Secondary | ICD-10-CM | POA: Diagnosis not present

## 2022-06-30 ENCOUNTER — Telehealth: Payer: Self-pay

## 2022-06-30 ENCOUNTER — Other Ambulatory Visit (INDEPENDENT_AMBULATORY_CARE_PROVIDER_SITE_OTHER): Payer: Medicare Other

## 2022-06-30 DIAGNOSIS — R188 Other ascites: Secondary | ICD-10-CM | POA: Diagnosis not present

## 2022-06-30 DIAGNOSIS — K7469 Other cirrhosis of liver: Secondary | ICD-10-CM | POA: Diagnosis not present

## 2022-06-30 DIAGNOSIS — J342 Deviated nasal septum: Secondary | ICD-10-CM | POA: Diagnosis not present

## 2022-06-30 DIAGNOSIS — K7460 Unspecified cirrhosis of liver: Secondary | ICD-10-CM

## 2022-06-30 DIAGNOSIS — M950 Acquired deformity of nose: Secondary | ICD-10-CM | POA: Diagnosis not present

## 2022-06-30 DIAGNOSIS — S022XXD Fracture of nasal bones, subsequent encounter for fracture with routine healing: Secondary | ICD-10-CM | POA: Diagnosis not present

## 2022-06-30 LAB — BASIC METABOLIC PANEL
BUN: 39 mg/dL — ABNORMAL HIGH (ref 6–23)
CO2: 22 mEq/L (ref 19–32)
Calcium: 8.7 mg/dL (ref 8.4–10.5)
Chloride: 85 mEq/L — ABNORMAL LOW (ref 96–112)
Creatinine, Ser: 1.73 mg/dL — ABNORMAL HIGH (ref 0.40–1.50)
GFR: 42.23 mL/min — ABNORMAL LOW (ref >60.00)
Glucose, Bld: 123 mg/dL — ABNORMAL HIGH (ref 70–99)
Potassium: 3.6 mEq/L (ref 3.5–5.1)
Sodium: 118 mEq/L — CL (ref 135–145)

## 2022-06-30 NOTE — Telephone Encounter (Signed)
Received critical value call from lab stating that pt's sodium is 118.

## 2022-07-01 ENCOUNTER — Inpatient Hospital Stay (HOSPITAL_COMMUNITY)
Admission: EM | Admit: 2022-07-01 | Discharge: 2022-07-08 | DRG: 442 | Disposition: A | Discharge status: Home / Self Care | Payer: Medicare Other | Attending: Internal Medicine | Admitting: Internal Medicine

## 2022-07-01 ENCOUNTER — Other Ambulatory Visit: Payer: Self-pay

## 2022-07-01 ENCOUNTER — Encounter (HOSPITAL_COMMUNITY): Payer: Self-pay

## 2022-07-01 DIAGNOSIS — K7031 Alcoholic cirrhosis of liver with ascites: Secondary | ICD-10-CM | POA: Diagnosis present

## 2022-07-01 DIAGNOSIS — K7030 Alcoholic cirrhosis of liver without ascites: Secondary | ICD-10-CM | POA: Diagnosis not present

## 2022-07-01 DIAGNOSIS — F1011 Alcohol abuse, in remission: Secondary | ICD-10-CM | POA: Diagnosis present

## 2022-07-01 DIAGNOSIS — T368X5A Adverse effect of other systemic antibiotics, initial encounter: Secondary | ICD-10-CM | POA: Diagnosis not present

## 2022-07-01 DIAGNOSIS — R748 Abnormal levels of other serum enzymes: Secondary | ICD-10-CM

## 2022-07-01 DIAGNOSIS — F419 Anxiety disorder, unspecified: Secondary | ICD-10-CM | POA: Diagnosis present

## 2022-07-01 DIAGNOSIS — Z8349 Family history of other endocrine, nutritional and metabolic diseases: Secondary | ICD-10-CM

## 2022-07-01 DIAGNOSIS — K2970 Gastritis, unspecified, without bleeding: Secondary | ICD-10-CM

## 2022-07-01 DIAGNOSIS — Z803 Family history of malignant neoplasm of breast: Secondary | ICD-10-CM

## 2022-07-01 DIAGNOSIS — G40909 Epilepsy, unspecified, not intractable, without status epilepticus: Secondary | ICD-10-CM | POA: Diagnosis present

## 2022-07-01 DIAGNOSIS — D689 Coagulation defect, unspecified: Secondary | ICD-10-CM | POA: Diagnosis present

## 2022-07-01 DIAGNOSIS — K766 Portal hypertension: Secondary | ICD-10-CM | POA: Diagnosis present

## 2022-07-01 DIAGNOSIS — R9431 Abnormal electrocardiogram [ECG] [EKG]: Secondary | ICD-10-CM | POA: Diagnosis not present

## 2022-07-01 DIAGNOSIS — R188 Other ascites: Secondary | ICD-10-CM | POA: Diagnosis present

## 2022-07-01 DIAGNOSIS — Z01818 Encounter for other preprocedural examination: Secondary | ICD-10-CM | POA: Diagnosis not present

## 2022-07-01 DIAGNOSIS — K767 Hepatorenal syndrome: Principal | ICD-10-CM

## 2022-07-01 DIAGNOSIS — K3189 Other diseases of stomach and duodenum: Secondary | ICD-10-CM | POA: Diagnosis not present

## 2022-07-01 DIAGNOSIS — I959 Hypotension, unspecified: Secondary | ICD-10-CM | POA: Diagnosis not present

## 2022-07-01 DIAGNOSIS — Z881 Allergy status to other antibiotic agents status: Secondary | ICD-10-CM

## 2022-07-01 DIAGNOSIS — E871 Hypo-osmolality and hyponatremia: Principal | ICD-10-CM | POA: Diagnosis present

## 2022-07-01 DIAGNOSIS — E119 Type 2 diabetes mellitus without complications: Secondary | ICD-10-CM | POA: Diagnosis present

## 2022-07-01 DIAGNOSIS — N179 Acute kidney failure, unspecified: Secondary | ICD-10-CM | POA: Diagnosis not present

## 2022-07-01 DIAGNOSIS — D649 Anemia, unspecified: Secondary | ICD-10-CM | POA: Diagnosis present

## 2022-07-01 DIAGNOSIS — Z820 Family history of epilepsy and other diseases of the nervous system: Secondary | ICD-10-CM

## 2022-07-01 DIAGNOSIS — E039 Hypothyroidism, unspecified: Secondary | ICD-10-CM | POA: Diagnosis present

## 2022-07-01 DIAGNOSIS — K219 Gastro-esophageal reflux disease without esophagitis: Secondary | ICD-10-CM | POA: Diagnosis present

## 2022-07-01 DIAGNOSIS — E2740 Unspecified adrenocortical insufficiency: Secondary | ICD-10-CM | POA: Diagnosis present

## 2022-07-01 DIAGNOSIS — Z880 Allergy status to penicillin: Secondary | ICD-10-CM

## 2022-07-01 DIAGNOSIS — E876 Hypokalemia: Secondary | ICD-10-CM | POA: Diagnosis present

## 2022-07-01 DIAGNOSIS — Z8052 Family history of malignant neoplasm of bladder: Secondary | ICD-10-CM

## 2022-07-01 DIAGNOSIS — R1084 Generalized abdominal pain: Secondary | ICD-10-CM | POA: Diagnosis not present

## 2022-07-01 DIAGNOSIS — E785 Hyperlipidemia, unspecified: Secondary | ICD-10-CM | POA: Diagnosis present

## 2022-07-01 DIAGNOSIS — Z79890 Hormone replacement therapy (postmenopausal): Secondary | ICD-10-CM

## 2022-07-01 DIAGNOSIS — R001 Bradycardia, unspecified: Secondary | ICD-10-CM | POA: Diagnosis not present

## 2022-07-01 DIAGNOSIS — K7682 Hepatic encephalopathy: Secondary | ICD-10-CM | POA: Diagnosis not present

## 2022-07-01 DIAGNOSIS — M6284 Sarcopenia: Secondary | ICD-10-CM | POA: Diagnosis present

## 2022-07-01 DIAGNOSIS — K7460 Unspecified cirrhosis of liver: Secondary | ICD-10-CM | POA: Diagnosis not present

## 2022-07-01 DIAGNOSIS — K7290 Hepatic failure, unspecified without coma: Secondary | ICD-10-CM | POA: Diagnosis not present

## 2022-07-01 DIAGNOSIS — I10 Essential (primary) hypertension: Secondary | ICD-10-CM | POA: Diagnosis present

## 2022-07-01 DIAGNOSIS — E8770 Fluid overload, unspecified: Secondary | ICD-10-CM | POA: Diagnosis present

## 2022-07-01 DIAGNOSIS — F32A Depression, unspecified: Secondary | ICD-10-CM | POA: Diagnosis present

## 2022-07-01 DIAGNOSIS — K652 Spontaneous bacterial peritonitis: Secondary | ICD-10-CM | POA: Diagnosis not present

## 2022-07-01 DIAGNOSIS — Z79899 Other long term (current) drug therapy: Secondary | ICD-10-CM

## 2022-07-01 DIAGNOSIS — R110 Nausea: Secondary | ICD-10-CM | POA: Diagnosis not present

## 2022-07-01 DIAGNOSIS — Z888 Allergy status to other drugs, medicaments and biological substances status: Secondary | ICD-10-CM

## 2022-07-01 DIAGNOSIS — Z8619 Personal history of other infectious and parasitic diseases: Secondary | ICD-10-CM | POA: Diagnosis not present

## 2022-07-01 DIAGNOSIS — K429 Umbilical hernia without obstruction or gangrene: Secondary | ICD-10-CM | POA: Diagnosis not present

## 2022-07-01 DIAGNOSIS — K760 Fatty (change of) liver, not elsewhere classified: Secondary | ICD-10-CM | POA: Diagnosis present

## 2022-07-01 LAB — COMPREHENSIVE METABOLIC PANEL
ALT: 25 U/L (ref 0–44)
AST: 46 U/L — ABNORMAL HIGH (ref 15–41)
Albumin: 3.6 g/dL (ref 3.5–5.0)
Alkaline Phosphatase: 92 U/L (ref 38–126)
Anion gap: 12 (ref 5–15)
BUN: 35 mg/dL — ABNORMAL HIGH (ref 8–23)
CO2: 20 mmol/L — ABNORMAL LOW (ref 22–32)
Calcium: 8.6 mg/dL — ABNORMAL LOW (ref 8.9–10.3)
Chloride: 88 mmol/L — ABNORMAL LOW (ref 98–111)
Creatinine, Ser: 1.81 mg/dL — ABNORMAL HIGH (ref 0.61–1.24)
GFR, Estimated: 42 mL/min — ABNORMAL LOW (ref >60)
Glucose, Bld: 125 mg/dL — ABNORMAL HIGH (ref 70–99)
Potassium: 3.9 mmol/L (ref 3.5–5.1)
Sodium: 120 mmol/L — ABNORMAL LOW (ref 135–145)
Total Bilirubin: 2.3 mg/dL — ABNORMAL HIGH (ref 0.3–1.2)
Total Protein: 6.3 g/dL — ABNORMAL LOW (ref 6.5–8.1)

## 2022-07-01 LAB — CBC WITH DIFFERENTIAL/PLATELET
Abs Immature Granulocytes: 0.05 10*3/uL (ref 0.00–0.07)
Basophils Absolute: 0.1 10*3/uL (ref 0.0–0.1)
Basophils Relative: 1 %
Eosinophils Absolute: 0.2 10*3/uL (ref 0.0–0.5)
Eosinophils Relative: 2 %
Hemoglobin: 10.7 g/dL — ABNORMAL LOW (ref 13.0–17.0)
Immature Granulocytes: 1 %
Lymphocytes Relative: 10 %
Lymphs Abs: 0.9 10*3/uL (ref 0.7–4.0)
Monocytes Absolute: 1.6 10*3/uL — ABNORMAL HIGH (ref 0.1–1.0)
Monocytes Relative: 17 %
Neutro Abs: 6.3 10*3/uL (ref 1.7–7.7)
Neutrophils Relative %: 69 %
Platelets: 286 10*3/uL (ref 150–400)
WBC: 9 10*3/uL (ref 4.0–10.5)

## 2022-07-01 LAB — TSH: TSH: 1.477 u[IU]/mL (ref 0.350–4.500)

## 2022-07-01 LAB — PROTIME-INR
INR: 1.4 — ABNORMAL HIGH (ref 0.8–1.2)
Prothrombin Time: 17.4 seconds — ABNORMAL HIGH (ref 11.4–15.2)

## 2022-07-01 LAB — SODIUM: Sodium: 119 mmol/L — CL (ref 135–145)

## 2022-07-01 LAB — AMMONIA: Ammonia: 55 umol/L — ABNORMAL HIGH (ref 9–35)

## 2022-07-01 MED ORDER — ALBUMIN HUMAN 25 % IV SOLN
25.0000 g | Freq: Four times a day (QID) | INTRAVENOUS | Status: AC
  Administered 2022-07-01 – 2022-07-03 (×7): 25 g via INTRAVENOUS
  Filled 2022-07-01 (×6): qty 100

## 2022-07-01 MED ORDER — SODIUM CHLORIDE 0.9% FLUSH
3.0000 mL | INTRAVENOUS | Status: DC | PRN
  Administered 2022-07-02: 3 mL via INTRAVENOUS

## 2022-07-01 MED ORDER — ONDANSETRON HCL 4 MG/2ML IJ SOLN
4.0000 mg | Freq: Four times a day (QID) | INTRAMUSCULAR | Status: DC | PRN
  Administered 2022-07-01 – 2022-07-08 (×4): 4 mg via INTRAVENOUS
  Filled 2022-07-01 (×4): qty 2

## 2022-07-01 MED ORDER — SODIUM CHLORIDE 0.9 % IV SOLN: 250.0000 mL | INTRAVENOUS | Status: DC | PRN

## 2022-07-01 MED ORDER — PANTOPRAZOLE SODIUM 20 MG PO TBEC
20.0000 mg | DELAYED_RELEASE_TABLET | Freq: Every day | ORAL | Status: DC
  Administered 2022-07-02 – 2022-07-08 (×7): 20 mg via ORAL
  Filled 2022-07-01 (×7): qty 1

## 2022-07-01 MED ORDER — SODIUM CHLORIDE 0.9 % IV SOLN
50.0000 ug/h | INTRAVENOUS | Status: DC
  Administered 2022-07-01 – 2022-07-04 (×7): 50 ug/h via INTRAVENOUS
  Filled 2022-07-01 (×9): qty 1

## 2022-07-01 MED ORDER — LEVETIRACETAM ER 500 MG PO TB24
1000.0000 mg | ORAL_TABLET | Freq: Every day | ORAL | Status: DC
  Administered 2022-07-01 – 2022-07-08 (×8): 1000 mg via ORAL
  Filled 2022-07-01 (×9): qty 2

## 2022-07-01 MED ORDER — RIFAXIMIN 550 MG PO TABS
550.0000 mg | ORAL_TABLET | Freq: Two times a day (BID) | ORAL | Status: DC
  Administered 2022-07-01 – 2022-07-08 (×14): 550 mg via ORAL
  Filled 2022-07-01 (×14): qty 1

## 2022-07-01 MED ORDER — ACETAMINOPHEN 325 MG PO TABS
650.0000 mg | ORAL_TABLET | ORAL | Status: DC | PRN
  Administered 2022-07-02 – 2022-07-04 (×2): 650 mg via ORAL
  Filled 2022-07-01 (×3): qty 2

## 2022-07-01 MED ORDER — MIDODRINE HCL 5 MG PO TABS
2.5000 mg | ORAL_TABLET | Freq: Two times a day (BID) | ORAL | Status: DC
  Administered 2022-07-01: 2.5 mg via ORAL
  Filled 2022-07-01: qty 1

## 2022-07-01 MED ORDER — CLONAZEPAM 0.5 MG PO TABS
0.5000 mg | ORAL_TABLET | Freq: Every day | ORAL | Status: DC
  Administered 2022-07-01 – 2022-07-07 (×7): 0.5 mg via ORAL
  Filled 2022-07-01 (×7): qty 1

## 2022-07-01 MED ORDER — COLESEVELAM HCL 625 MG PO TABS
625.0000 mg | ORAL_TABLET | Freq: Every day | ORAL | Status: DC
  Administered 2022-07-02: 625 mg via ORAL
  Filled 2022-07-01: qty 1

## 2022-07-01 MED ORDER — ONDANSETRON HCL 4 MG/2ML IJ SOLN
4.0000 mg | Freq: Once | INTRAMUSCULAR | Status: AC
  Administered 2022-07-01: 4 mg via INTRAVENOUS
  Filled 2022-07-01: qty 2

## 2022-07-01 MED ORDER — OXYCODONE HCL 5 MG PO TABS
5.0000 mg | ORAL_TABLET | Freq: Once | ORAL | Status: AC
  Administered 2022-07-01: 5 mg via ORAL
  Filled 2022-07-01: qty 1

## 2022-07-01 MED ORDER — ONDANSETRON 4 MG PO TBDP: 4.0000 mg | ORAL_TABLET | Freq: Three times a day (TID) | ORAL | Status: DC | PRN

## 2022-07-01 MED ORDER — ESCITALOPRAM OXALATE 10 MG PO TABS: 5.0000 mg | ORAL_TABLET | Freq: Every day | ORAL | Status: DC

## 2022-07-01 MED ORDER — FLUTICASONE PROPIONATE 50 MCG/ACT NA SUSP
1.0000 | Freq: Every day | NASAL | Status: DC
  Filled 2022-07-01: qty 16

## 2022-07-01 MED ORDER — PHYTONADIONE 5 MG PO TABS
5.0000 mg | ORAL_TABLET | Freq: Once | ORAL | Status: AC
  Administered 2022-07-01: 5 mg via ORAL
  Filled 2022-07-01: qty 1

## 2022-07-01 MED ORDER — BUSPIRONE HCL 15 MG PO TABS: 7.5000 mg | ORAL_TABLET | Freq: Two times a day (BID) | ORAL | Status: DC

## 2022-07-01 MED ORDER — BUSPIRONE HCL 15 MG PO TABS
7.5000 mg | ORAL_TABLET | Freq: Every day | ORAL | Status: DC
  Administered 2022-07-02 – 2022-07-08 (×7): 7.5 mg via ORAL
  Filled 2022-07-01 (×7): qty 1

## 2022-07-01 MED ORDER — THIAMINE MONONITRATE 100 MG PO TABS
100.0000 mg | ORAL_TABLET | Freq: Every day | ORAL | Status: DC
  Administered 2022-07-02 – 2022-07-08 (×7): 100 mg via ORAL
  Filled 2022-07-01 (×8): qty 1

## 2022-07-01 MED ORDER — FOLIC ACID 1 MG PO TABS
1.0000 mg | ORAL_TABLET | Freq: Every day | ORAL | Status: DC
  Administered 2022-07-01 – 2022-07-07 (×7): 1 mg via ORAL
  Filled 2022-07-01 (×7): qty 1

## 2022-07-01 MED ORDER — ZOLPIDEM TARTRATE 5 MG PO TABS: 5.0000 mg | ORAL_TABLET | Freq: Every evening | ORAL | Status: DC | PRN

## 2022-07-01 MED ORDER — EZETIMIBE 10 MG PO TABS
10.0000 mg | ORAL_TABLET | Freq: Every day | ORAL | Status: DC
  Administered 2022-07-02: 10 mg via ORAL
  Filled 2022-07-01: qty 1

## 2022-07-01 MED ORDER — BUSPIRONE HCL 15 MG PO TABS
15.0000 mg | ORAL_TABLET | Freq: Every day | ORAL | Status: DC
  Administered 2022-07-01 – 2022-07-07 (×7): 15 mg via ORAL
  Filled 2022-07-01 (×3): qty 1
  Filled 2022-07-01: qty 2
  Filled 2022-07-01 (×3): qty 1

## 2022-07-01 MED ORDER — LEVOTHYROXINE SODIUM 88 MCG PO TABS
88.0000 ug | ORAL_TABLET | Freq: Every day | ORAL | Status: DC
  Administered 2022-07-02 – 2022-07-08 (×7): 88 ug via ORAL
  Filled 2022-07-01 (×8): qty 1

## 2022-07-01 MED ORDER — LACTULOSE 10 GM/15ML PO SOLN
10.0000 g | Freq: Two times a day (BID) | ORAL | Status: DC | PRN
  Administered 2022-07-02: 10 g via ORAL
  Filled 2022-07-01: qty 15

## 2022-07-01 MED ORDER — SODIUM CHLORIDE 0.9% FLUSH
3.0000 mL | Freq: Two times a day (BID) | INTRAVENOUS | Status: DC
  Administered 2022-07-01 – 2022-07-02 (×2): 3 mL via INTRAVENOUS

## 2022-07-01 MED ORDER — SODIUM CHLORIDE 0.9 % IV BOLUS
500.0000 mL | Freq: Once | INTRAVENOUS | Status: AC
  Administered 2022-07-01: 500 mL via INTRAVENOUS

## 2022-07-01 NOTE — Telephone Encounter (Signed)
Spoke with pt's wife and pt's wife stated that pt has had nausea and vomiting, unsteady gait and confusion. Let wife know that since he is symptomatic pt needs to go to the ER. Pt's wife verbalized understanding.

## 2022-07-01 NOTE — ED Notes (Signed)
Pt states the nausea has improved since administration of zofran.

## 2022-07-01 NOTE — Telephone Encounter (Signed)
Please call to check in on Andrew Patton.  Sodium 118 is below his baseline which is typically in the low 130s.  Please call to check in about confusion, unsteady gait, etc. if symptomatic, to go to ER.  If asymptomatic, repeat BMP today

## 2022-07-01 NOTE — Progress Notes (Signed)
   07/01/22 2317  Provider Notification  Provider Name/Title Orlene Och  Date Provider Notified 07/01/22  Time Provider Notified 2318  Method of Notification Page  Notification Reason Critical Result (Sodium level 119)  Test performed and critical result sodium 119  Date Critical Result Received 07/01/22  Time Critical Result Received 2238  Provider response Other (Comment) (waiting for response)

## 2022-07-01 NOTE — Consult Note (Addendum)
Consultation  Referring Provider:   [facility] Primary Care Physician:  [name], MD Primary Gastroenterologist:  Dr. Barron Alvine       Reason for Consultation:     Hyponatremia with AKI and decompensated ETOH cirrhosis   Impression and Plan:   Decompensated cirrhosis secondary to ETOH with history of refractory ascites with SBP on bactrim for prophylaxis, HE, HRS scented with hyponatremia, AKI and confusion.  mild hypotension, otherwise hemodynamically stable. WBC 9.0 HGB 10.7 Platelets 286 AST 33 ALT 22  Alkphos 71 TBili 2.5 INR 07/02/2022 1.5  MELD-Na: 29 at 07/02/2022  3:20 AM - Serial INR, CBC, CMET daily. - Daily MELD -HCC screening 05/26/2022 CT abdomen pelvis Patient has been followed by [facility] hepatology, patient's wife give me a number to call for [facility] transplant team [phone]. Complicated patient, likely will need TIPS versus and/or transplantation. Echo completed at [facility] 05/2022  Ascites refractory to diuretics complicated by hyponatremia and SBP history -Getting serial paracenteses every 7 days, patient scheduled today. -Please send for cell count with differential, albumin concentration, total protein concentration. -Diuretics currently on hold due to renal function, continue to monitor renal function.   -With severe CKD history, will do max of 2-3 L  - on rocephin IV for SBP prophylaxis, was on Bactrim outpatient, consider doxycycline for prophylaxis  Hyponatremia with confusion and history of hepatic encephalopathy Baseline around 130, was 118, slowly improving currently at 120. Potentially worsened by bactrim? Which is on hold Per primary   Hepatic Encephalopathy:  07/01/2022 Ammonia 55 Is lethargic on exam but has no asterixis or clonus ? Hyponatremia contributing - Lactulose 66ml BID or TID titrate to 3bms per day - Rifaximin 550mg  bid - minimize/remove all benzos and narcotics -Patient advised to not drive due to impaired reflexes - add on zinc  220  Acute renal failure possibly due to hepatorenal syndrome BUN 38 Cr 2.01 baseline closer to 1.2 for creatinine. GFR 37  Potassium 3.9  Magnesium 1.8  -Pending urine sodium but most likely this represents hepatorenal syndrome -Patient has been having nocturia, urinary frequency and decreased volume per wife at home.  We will add on urinalysis patient is on Rocephin. -Avoid large volume paracentesis -Diuretics on hold, DC antihypertensives  -Avoid nephrotoxic drugs - has received albumin 25 mg every 6 hours, suggest continuing for 2 days.  Max of 100 g daily.   -Currently getting octreotide with Midodrine - would suggest nephrology consult -Was being considered for TIPS at [facility] hepatology, current MELD 29 but likely this is worse due to creatinine from HRS, bilirubin 2.5. -Consider consult IR for TIPS.    with Coagulopathy 07/02/2022 INR 1.5 Check PT/INR daily  Esophageal Varices: last EGD  08/2021 LA grade a esophagitis, no esophageal varices noted.  Moderate portal hypertensive gastropathy duodenitis.  Negative for celiac, negative H. pylori. No evidence of melena/hematochezia/hematemesis. Repeat EGD timing per Dr. 09/2021  Thank you for your kind consultation, we will continue to follow.         HPI:   Andrew Patton is a 61 y.o. male with past medical history significant for hypertension, hyperlipidemia, decompensated alcoholic induced cirrhosis(abstinent since 2022 ), refractory ascites requiring weekly paracentesis, chronic hyponatremia, hypertensive gastropathy, adult onset diabetes, seizure disorder presents with worsening hyponatremia and confusion.  Has been established with [facility] hepatology to undergo potential transplant evaluation and possible TIPS. 05/26/2022 hospital admission for abdominal pain cell counts consistent with SBP.  PMNs 5500 culture negative.   Treated  with IV albumin, ceftriaxone, discharged on Cipro.. There is some concern for early HRS at  that time was started on octreotide, midodrine and albumin.   06/04/2022 office visit with [facility] gastroenterology Dr. Velna Hatchet.  At that visit added zinc, increase lactulose to aim for 2-3 bowel movements daily, hemoglobin 9.7, MCV 99, white blood cell count 9.5, platelets 290, creatinine 1.6, BUN 16, total bilirubin 3.6. 06/14/2022 possible reaction to Cipro with rash discontinued and started on Bactrim 800 mg daily  Repeat labs in the office showed sodium 118 which is below baseline of 130, due to patient's worsening symptoms of confusion dizziness nausea and vomiting was referred to the ER for evaluation.    Patient had mild hypertension, otherwise hemodynamically stable. INR mildly elevated 1.4, no leukocytosis.  Hemoglobin 10.2. Ammonia slightly elevated 55, patient on rifaximin Vedre 50 mg twice daily and lactulose  Admitting BUN 35, creatinine 1.81.  (Baseline BUN 17, creatinine 1.2)  total bilirubin 2.3, AST 46, ALT 25. Patient has been initiated on octreotide and has received albumin 25 g x 2 so far.  BUN today 38, creatinine 2.01. Pending urine sodium and urine osmolality.  Patient lying in bed, lethargic, responds easily but unable to answer questions properly.  Wife Andrew Patton is at bedside she gives the majority of the history. She states they have had a lot of deaths in the family has been rather depressed and not doing well healthwise. Patient does not know which medications he is taking however Andrew Patton assures he has been taking his Lasix 80 mg in the morning and spironolactone 150 mg at night.  He has been on Bactrim switched from Cipro outpatient for SBP prophylaxis.  Wife been getting more confused with some leg cramps. He has been on rifaximin and lactulose, having 2 bowel movements daily.  Denies melena or hematochezia. Started to have nausea with vomiting for 1 week worse after eating. On bloody emesis. Has had some trouble swallowing large pills otherwise no dysphagia. Has been  having worsening GERD.  He is on Protonix just once daily. Patient's been having dark urine and decreased urinary output, nocturia and some mild dysuria.  This has been going on since last hospitalization in October. He continues to have allover abdominal discomfort. Has chills but denies fever. Had a fall in September. Has bloodshot eyes, and has had some jaundice. Denies chest pain but has had some dyspnea on exertion, gives out easily. Denies swelling in his legs, has swelling in his abdomen he gets serial paracentesis for last 111/24/2023 5 L removed.  Prior to that was 06/18/2022 4 L removed, scheduled for paracentesis today.  EGD 08/2021 LA grade a esophagitis, no esophageal varices noted.  Moderate portal hypertensive gastropathy duodenitis.  Negative for celiac, negative H. pylori. HCC screening 05/26/2022 CT abdomen pelvis Colon cancer screening 01/2023 unremarkable   Abnormal ED labs: Abnormal Labs Reviewed  CBC WITH DIFFERENTIAL/PLATELET - Abnormal; Notable for the following components:      Result Value   Hemoglobin 10.7 (*)    Monocytes Absolute 1.6 (*)    All other components within normal limits  COMPREHENSIVE METABOLIC PANEL - Abnormal; Notable for the following components:   Sodium 120 (*)    Chloride 88 (*)    CO2 20 (*)    Glucose, Bld 125 (*)    BUN 35 (*)    Creatinine, Ser 1.81 (*)    Calcium 8.6 (*)    Total Protein 6.3 (*)    AST 46 (*)  Total Bilirubin 2.3 (*)    GFR, Estimated 42 (*)    All other components within normal limits  AMMONIA - Abnormal; Notable for the following components:   Ammonia 55 (*)    All other components within normal limits     Past Medical History:  Diagnosis Date   Allergy    Anxiety    Arthritis    Ascites    Diabetes (HCC)    Elevated transaminase level    Fatigue    Fatty liver    Hyperlipidemia    Hypertension    "under control", not on any blood pressure medication, has lost 60 lbs   Hypothyroidism    on  synthroid   Inguinal muscle strain    Insomnia    Left knee pain    "long time ago"   Multinodular goiter    Obesity    Pneumonia    Psoriasis    Tinnitus     Surgical History:  He  has a past surgical history that includes Appendectomy (1976); Nose surgery; Rotator cuff repair (Left); Foot surgery (Bilateral, 1975); Shoulder surgery (Right, 12/20/2019); IR Paracentesis (11/25/2021); IR Paracentesis (12/02/2021); IR Paracentesis (12/17/2021); IR Radiologist Eval & Mgmt (12/17/2021); IR Radiologist Eval & Mgmt (12/31/2021); IR Paracentesis (01/05/2022); IR Paracentesis (02/04/2022); IR Paracentesis (02/15/2022); IR Paracentesis (03/03/2022); IR Paracentesis (03/10/2022); IR Paracentesis (03/16/2022); IR Paracentesis (03/23/2022); IR Paracentesis (03/29/2022); IR Paracentesis (04/06/2022); IR Paracentesis (04/20/2022); IR Paracentesis (05/04/2022); IR Paracentesis (05/20/2022); IR Paracentesis (05/31/2022); IR Paracentesis (06/11/2022); IR Paracentesis (06/18/2022); and IR Paracentesis (06/25/2022). Family History:  His family history includes Birth defects in his maternal grandfather and paternal grandmother; Bladder Cancer in his father; Breast cancer in his mother; Hyperlipidemia in his mother; Memory loss in his maternal grandmother; Other in his mother; Parkinson's disease in his father; Parkinsonism in his mother. Social History:   reports that he quit smoking about 28 years ago. His smoking use included cigarettes. He has quit using smokeless tobacco.  His smokeless tobacco use included chew. He reports that he does not currently use alcohol after a past usage of about 2.0 standard drinks of alcohol per week. He reports that he does not use drugs.  Prior to Admission medications   Medication Sig Start Date End Date Taking? Authorizing Provider  busPIRone (BUSPAR) 15 MG tablet Take 7.5-15 mg by mouth 2 (two) times daily. Take 1/2 tablet (7.5 mg) in the morning and Take 1 tablet (15 mg) at supper    [provider]  clonazePAM (KLONOPIN) 0.5 MG tablet Take 0.5 mg by mouth at bedtime.    [provider]  colesevelam (WELCHOL) 625 MG tablet Take 625 mg by mouth daily with breakfast.    [provider]  escitalopram (LEXAPRO) 5 MG tablet Take 5 mg by mouth daily. 02/14/22   [provider]  eszopiclone (LUNESTA) 2 MG TABS tablet Take 2 mg by mouth at bedtime. Take immediately before bedtime    [provider]  ezetimibe (ZETIA) 10 MG tablet Take 10 mg by mouth daily.    [provider]  fluticasone (FLONASE) 50 MCG/ACT nasal spray Place 1 spray into both nostrils at bedtime. 01/05/13   [provider]  folic acid (FOLVITE) 1 MG tablet Take 1 mg by mouth at bedtime.    [provider]  furosemide (LASIX) 80 MG tablet Take 1 tablet (80 mg total) by mouth daily. 05/17/22   Cirigliano, Vito V, DO  ipratropium (ATROVENT) 0.06 % nasal spray Place 1 spray into  the nose daily as needed for rhinitis. 02/17/19   [provider]  lactulose (CHRONULAC) 10 GM/15ML solution Take 15 mLs (10 g total) by mouth 2 (two) times daily as needed for mild constipation (for bowel movments 2-3 per day). 07/06/21   Pokhrel, Rebekah Chesterfield, MD  levETIRAcetam (KEPPRA XR) 500 MG 24 hr tablet Take 2 tablets (1,000 mg total) by mouth daily. Patient taking differently: Take 1,000 mg by mouth at bedtime. 12/10/21   Anson Fret, MD  levothyroxine (SYNTHROID) 88 MCG tablet Take 88 mcg by mouth every morning. 02/14/22   [provider]  ondansetron (ZOFRAN ODT) 4 MG disintegrating tablet Take 1 tablet (4 mg total) by mouth every 8 (eight) hours as needed for nausea or vomiting. Patient taking differently: Take 4 mg by mouth every 8 (eight) hours as needed for nausea or vomiting (dissolve orally). 02/08/20   Maxwell Caul, PA-C  pantoprazole (PROTONIX) 20 MG tablet Take 1 tablet (20 mg total) by mouth daily. 06/01/22   Tyrone Nine, MD  rifaximin (XIFAXAN) 550 MG  TABS tablet Take 1 tablet (550 mg total) by mouth 2 (two) times daily. 12/15/21   Cirigliano, Vito V, DO  spironolactone (ALDACTONE) 100 MG tablet Take 1 tablet (100 mg total) by mouth daily. Take 1 tablet with 50 mg tablet (150 mg total) by mouth daily. Patient taking differently: Take 150 mg by mouth daily. Taking 1 & 1/2 tabs = 150 mg daily 12/08/21   Cirigliano, Vito V, DO  sulfamethoxazole-trimethoprim (BACTRIM DS) 800-160 MG tablet Take 1 tablet by mouth daily. 06/16/22   Cirigliano, Vito V, DO  thiamine 100 MG tablet Take 1 tablet (100 mg total) by mouth daily. 07/06/21   Pokhrel, Rebekah Chesterfield, MD  zinc sulfate 220 (50 Zn) MG capsule Take by mouth. 06/04/22 06/04/23  [provider]    Current Facility-Administered Medications  Medication Dose Route Frequency Provider Last Rate Last Admin   phytonadione (VITAMIN K) tablet 5 mg  5 mg Oral Once Emeline General, MD       Current Outpatient Medications  Medication Sig Dispense Refill   busPIRone (BUSPAR) 15 MG tablet Take 7.5-15 mg by mouth 2 (two) times daily. Take 1/2 tablet (7.5 mg) in the morning and Take 1 tablet (15 mg) at supper     clonazePAM (KLONOPIN) 0.5 MG tablet Take 0.5 mg by mouth at bedtime.     colesevelam (WELCHOL) 625 MG tablet Take 625 mg by mouth daily with breakfast.     escitalopram (LEXAPRO) 5 MG tablet Take 5 mg by mouth daily.     eszopiclone (LUNESTA) 2 MG TABS tablet Take 2 mg by mouth at bedtime. Take immediately before bedtime     ezetimibe (ZETIA) 10 MG tablet Take 10 mg by mouth daily.     fluticasone (FLONASE) 50 MCG/ACT nasal spray Place 1 spray into both nostrils at bedtime.     folic acid (FOLVITE) 1 MG tablet Take 1 mg by mouth at bedtime.     furosemide (LASIX) 80 MG tablet Take 1 tablet (80 mg total) by mouth daily. 30 tablet 3   ipratropium (ATROVENT) 0.06 % nasal spray Place 1 spray into the nose daily as needed for rhinitis.     lactulose (CHRONULAC) 10 GM/15ML solution Take 15 mLs (10 g total) by mouth  2 (two) times daily as needed for mild constipation (for bowel movments 2-3 per day).     levETIRAcetam (KEPPRA XR) 500 MG 24 hr tablet Take 2 tablets (  1,000 mg total) by mouth daily. (Patient taking differently: Take 1,000 mg by mouth at bedtime.) 180 tablet 3   levothyroxine (SYNTHROID) 88 MCG tablet Take 88 mcg by mouth every morning.     ondansetron (ZOFRAN ODT) 4 MG disintegrating tablet Take 1 tablet (4 mg total) by mouth every 8 (eight) hours as needed for nausea or vomiting. (Patient taking differently: Take 4 mg by mouth every 8 (eight) hours as needed for nausea or vomiting (dissolve orally).) 6 tablet 0   pantoprazole (PROTONIX) 20 MG tablet Take 1 tablet (20 mg total) by mouth daily.     rifaximin (XIFAXAN) 550 MG TABS tablet Take 1 tablet (550 mg total) by mouth 2 (two) times daily. 180 tablet 3   spironolactone (ALDACTONE) 100 MG tablet Take 1 tablet (100 mg total) by mouth daily. Take 1 tablet with 50 mg tablet (150 mg total) by mouth daily. (Patient taking differently: Take 150 mg by mouth daily. Taking 1 & 1/2 tabs = 150 mg daily) 60 tablet 0   sulfamethoxazole-trimethoprim (BACTRIM DS) 800-160 MG tablet Take 1 tablet by mouth daily. 30 tablet 6   thiamine 100 MG tablet Take 1 tablet (100 mg total) by mouth daily. 90 tablet 0   zinc sulfate 220 (50 Zn) MG capsule Take by mouth.      Allergies as of 07/01/2022 - Review Complete 07/01/2022  Allergen Reaction Noted   Fentanyl Shortness Of Breath 06/18/2022   Crestor [rosuvastatin calcium] Other (See Comments) 03/15/2019   Meloxicam Other (See Comments) 03/08/2019   Rosuvastatin Other (See Comments) 07/02/2021   Ciprofloxacin Rash 06/17/2022   Penicillins Rash 01/24/2013    Review of Systems:    Constitutional: No weight loss, fever, chills, weakness or fatigue HEENT: Eyes: No change in vision               Ears, Nose, Throat:  No change in hearing or congestion Skin: No rash or itching Cardiovascular: No chest pain, chest  pressure or palpitations   Respiratory: No SOB or cough Gastrointestinal: See HPI and otherwise negative Genitourinary: No dysuria or change in urinary frequency Neurological: No headache, dizziness or syncope Musculoskeletal: No new muscle or joint pain Hematologic: No bleeding or bruising Psychiatric: No history of depression or anxiety     Physical Exam:  Vital signs in last 24 hours: Temp:  [97.8 F (36.6 C)-98.2 F (36.8 C)] 97.8 F (36.6 C) (11/30 1450) Pulse Rate:  [66-84] 66 (11/30 1644) Resp:  [16] 16 (11/30 1644) BP: (113-123)/(50-77) 113/77 (11/30 1644) SpO2:  [98 %-100 %] 100 % (11/30 1644) Weight:  [69.9 kg] 69.9 kg (11/30 1125)   Last BM recorded by nurses in past 5 days No data recorded  General : Chronically ill-appearing, thin male, lethargic but no acute distress. Head: Has nose bridge on his nose from prior fall in September. Eyes :  scleral icterus, some sensitivity to light, soft globes, conjunctival hemorrhage generalized. Heart:  regular rate and rhythm Pulm:  Clear anteriorly; no wheezing Abdomen:   Distended, Obese AB, skin exam normal, Normal bowel sounds. mild tenderness in the entire abdomen. Without guarding and Without rebound, hepatomegaly noted.  with  fluid wave,  with  shifting dullness.  Extremities:   Without edema, feet warm. Msk:  Symmetrical without gross deformities. Peripheral pulses intact.  Neurologic: Alert and  oriented x4; lethargic but able to answer questions.  without asterixis or clonus.  Skin:   without jaundice. no palmar erythema or spider angioma.   Psychiatric:  Normal judgment, normal mood, lethargic.   LAB RESULTS: Recent Labs    07/01/22 1148  WBC 9.0  HGB 10.7*  HCT RESULTS UNAVAILABLE DUE TO INTERFERING SUBSTANCE  PLT 286   BMET Recent Labs    06/30/22 1400 07/01/22 1148  NA 118* 120*  K 3.6 3.9  CL 85* 88*  CO2 22 20*  GLUCOSE 123* 125*  BUN 39* 35*  CREATININE 1.73* 1.81*  CALCIUM 8.7 8.6*    LFT Recent Labs    07/01/22 1148  PROT 6.3*  ALBUMIN 3.6  AST 46*  ALT 25  ALKPHOS 92  BILITOT 2.3*   PT/INR No results for input(s): "LABPROT", "INR" in the last 72 hours.  STUDIES: No results found.   Doree Albeemanda R Collier  07/01/2022, 4:47 PM   MELD 3.0: 29 at 07/02/2022  3:20 AM MELD-Na: 29 at 07/02/2022  3:20 AM Calculated from: Serum Creatinine: 2.01 mg/dL at 16/1/096012/08/2021  4:543:20 AM Serum Sodium: 118 mmol/L (Using min of 125 mmol/L) at 07/02/2022  3:20 AM Total Bilirubin: 2.5 mg/dL at 09/8/119112/08/2021  4:783:20 AM Serum Albumin: 3.3 g/dL at 29/5/621312/08/2021  0:863:20 AM INR(ratio): 1.5 at 07/02/2022  3:20 AM Age at listing (hypothetical): [age] years Sex: Male at 07/02/2022  3:20 AM  ------------------------------------------------------------------------------------------------  I have taken a history, reviewed the chart and examined the patient. I performed a substantive portion of this encounter, including complete performance of at least one of the key components, in conjunction with the APP. I agree with the APP's note, impression and recommendations  61 year old male with decompensated alcoholic cirrhosis complicated refractory ascites and hepatic encephalopathy, admitted with severe hyponatremia and acute kidney injury which has not responded yet to volume resuscitation.   Etiology for AKI and worsening hyponatremia unclear, but certainly the recent addition Septra makes it a possible suspect. Will continue to treat empirically with albumin, octreotide and midodrine, holding diuretics. Agree with fluid restriction, no need for hypertonic saline for now.  Currently on ceftriaxone for possible SBP.  Low suspicion for SBP given adherence to his SBP prophylaxis.  Once cell count back from his paracentesis today, can switch to another oral prophylaxis option such as doxycycline.  He appears to be at his baseline mental status per his wife, although he been slightly confused and lethargic earlier this  week.  Unclear if he has had a bowel movement today.  He typically takes lactulose PRN.  If we are unable to get reliable bowel movement history, then may need to do scheduled lactulose in addition to his rifaximin.  [facility].  [facility] to see if [facility] would like him transferred there.  Dr. Elnoria HowardHung will be covering [facility] over the weekend  Reily Ilic E. Tomasa Randunningham, MD [facility]  Addendum: I spoke with Dr. Vickii PennaApril Hall [facility] about this patient today.  She felt that as long as the patient remained stable clinically, there is no need for inpatient transfer, and he could keep his outpatient transplant evaluation as scheduled.  Should the patient develop rapidly worsening renal failure, significant mental status changes or worsening/unimproved hyponatremia, we should reengage them for inpatient transfer.  In the meantime, she recommended tightening fluid restriction to 1.5 L daily, obtaining nephrology consultation and checking an a.m. cortisol level.  When asked about her recommendation for SBP prophylaxis, given his allergic reaction to Cipro and suspected renal insufficiency/hyponatremia from Septra, she suggested cefdinir.  However, the patient has  a listed penicillin allergy.  Therefore, she recommended ID consultation to help choose the best therapy.  Additional recs: - Nephrology consultation - Tighten fluid restriction to 1.5 L - A.m. cortisol level - ID consultation for SBP prophylaxis recommendation - Transfer to [facility] if rapidly worsening renal failure/hyponatremia/mental status

## 2022-07-01 NOTE — ED Notes (Signed)
Patient had 2 spells of emesis and provided Zofran. Patient assisted with an emesis bag and then a urinal

## 2022-07-01 NOTE — ED Provider Notes (Signed)
[facility] EMERGENCY DEPARTMENT Provider Note   CSN: [id number] Arrival date & time: 07/01/22  1119     History  Chief Complaint  Patient presents with   Abnormal Lab    PCP notified patient of low sodium, instructed pt to go to ED.     Andrew Patton is a 61 y.o. male.  With past medical history significant for prior EtOH use disorder (in remission since 2022), cirrhosis-under consideration for transplant and TIPS with [facility] transplant, recurrent ascites requiring weekly paracentesis (has appointment for paracentesis with radiology tomorrow), presenting with concerns for hyponatremia.  Patient is accompanied by his wife who assists in giving the history.  Patient gets regular labs and was called by his provider due to low sodium.  Patient endorses confusion, lightheadedness, dizziness, nausea, vomiting.  They state he has been drinking fluids.  He has had some solid intake, however he has had multiple episodes of nonbloody emesis.  He also endorses abdominal pain.  They deny fever, diarrhea.  Patient did have normal bowel movement this morning.  He denies chest pain, shortness of breath.  He did have a stumble yesterday landing on his knees while tripping on a grate, he did not hit his head or lose consciousness.      Home Medications Prior to Admission medications   Medication Sig Start Date End Date Taking? Authorizing Provider  busPIRone (BUSPAR) 15 MG tablet Take 7.5-15 mg by mouth 2 (two) times daily. Take 1/2 tablet (7.5 mg) in the morning and Take 1 tablet (15 mg) at supper    [provider]  clonazePAM (KLONOPIN) 0.5 MG tablet Take 0.5 mg by mouth at bedtime.    [provider]  colesevelam (WELCHOL) 625 MG tablet Take 625 mg by mouth daily with breakfast.    [provider]  escitalopram (LEXAPRO) 5 MG tablet Take 5 mg by mouth daily. 02/14/22   [provider]  eszopiclone (LUNESTA) 2 MG TABS tablet Take 2 mg by mouth at  bedtime. Take immediately before bedtime    [provider]  ezetimibe (ZETIA) 10 MG tablet Take 10 mg by mouth daily.    [provider]  fluticasone (FLONASE) 50 MCG/ACT nasal spray Place 1 spray into both nostrils at bedtime. 01/05/13   [provider]  folic acid (FOLVITE) 1 MG tablet Take 1 mg by mouth at bedtime.    [provider]  furosemide (LASIX) 80 MG tablet Take 1 tablet (80 mg total) by mouth daily. 05/17/22   Cirigliano, Vito V, DO  ipratropium (ATROVENT) 0.06 % nasal spray Place 1 spray into the nose daily as needed for rhinitis. 02/17/19   [provider]  lactulose (CHRONULAC) 10 GM/15ML solution Take 15 mLs (10 g total) by mouth 2 (two) times daily as needed for mild constipation (for bowel movments 2-3 per day). 07/06/21   Pokhrel, Rebekah Chesterfield, MD  levETIRAcetam (KEPPRA XR) 500 MG 24 hr tablet Take 2 tablets (1,000 mg total) by mouth daily. Patient taking differently: Take 1,000 mg by mouth at bedtime. 12/10/21   Anson Fret, MD  levothyroxine (SYNTHROID) 88 MCG tablet Take 88 mcg by mouth every morning. 02/14/22   [provider]  ondansetron (ZOFRAN ODT) 4 MG disintegrating tablet Take 1 tablet (4 mg total) by mouth every 8 (eight) hours as needed for nausea or vomiting. Patient taking differently: Take 4 mg by mouth every 8 (eight) hours as needed for nausea or vomiting (dissolve orally). 02/08/20  Graciella Freer A, PA-C  pantoprazole (PROTONIX) 20 MG tablet Take 1 tablet (20 mg total) by mouth daily. 06/01/22   Tyrone Nine, MD  rifaximin (XIFAXAN) 550 MG TABS tablet Take 1 tablet (550 mg total) by mouth 2 (two) times daily. 12/15/21   Cirigliano, Vito V, DO  spironolactone (ALDACTONE) 100 MG tablet Take 1 tablet (100 mg total) by mouth daily. Take 1 tablet with 50 mg tablet (150 mg total) by mouth daily. Patient taking differently: Take 150 mg by mouth daily. Taking 1 & 1/2 tabs = 150 mg daily 12/08/21   Cirigliano, Vito V, DO   sulfamethoxazole-trimethoprim (BACTRIM DS) 800-160 MG tablet Take 1 tablet by mouth daily. 06/16/22   Cirigliano, Vito V, DO  thiamine 100 MG tablet Take 1 tablet (100 mg total) by mouth daily. 07/06/21   Pokhrel, Rebekah Chesterfield, MD  zinc sulfate 220 (50 Zn) MG capsule Take by mouth. 06/04/22 06/04/23  [provider]      Allergies    Fentanyl, Crestor [rosuvastatin calcium], Meloxicam, Rosuvastatin, Ciprofloxacin, and Penicillins    Review of Systems   Review of Systems  Physical Exam Updated Vital Signs BP (!) 117/50   Pulse 84   Temp 97.8 F (36.6 C) (Oral)   Resp 16   Ht 5\' 6"  (1.676 m)   Wt 69.9 kg   SpO2 100%   BMI 24.86 kg/m  Physical Exam Constitutional:      Appearance: He is ill-appearing (chronically).  HENT:     Head: Normocephalic and atraumatic.     Nose: Nose normal.  Eyes:     Extraocular Movements: Extraocular movements intact.     Pupils: Pupils are equal, round, and reactive to light.     Comments: Bilateral conjunctival hemorrhage, slight scleral icterus  Cardiovascular:     Pulses: Normal pulses.     Heart sounds: Murmur (holosystolic) heard.  Pulmonary:     Effort: Pulmonary effort is normal.     Breath sounds: Normal breath sounds.  Abdominal:     General: There is distension.     Tenderness: There is abdominal tenderness (diffuse). There is no guarding or rebound.     Hernia: A hernia (reducible) is present.  Musculoskeletal:        General: No swelling, deformity or signs of injury.     Right lower leg: No edema.     Left lower leg: No edema.  Skin:    General: Skin is warm and dry.  Neurological:     Mental Status: He is alert and oriented to person, place, and time.     Comments: Tired appearing.  Does endorse some confusion and feeling "like my head just is not right."     ED Results / Procedures / Treatments   Labs (all labs ordered are listed, but only abnormal results are displayed) Labs Reviewed  CBC WITH  DIFFERENTIAL/PLATELET - Abnormal; Notable for the following components:      Result Value   Hemoglobin 10.7 (*)    Monocytes Absolute 1.6 (*)    All other components within normal limits  COMPREHENSIVE METABOLIC PANEL - Abnormal; Notable for the following components:   Sodium 120 (*)    Chloride 88 (*)    CO2 20 (*)    Glucose, Bld 125 (*)    BUN 35 (*)    Creatinine, Ser 1.81 (*)    Calcium 8.6 (*)    Total Protein 6.3 (*)    AST 46 (*)    Total  Bilirubin 2.3 (*)    GFR, Estimated 42 (*)    All other components within normal limits  AMMONIA - Abnormal; Notable for the following components:   Ammonia 55 (*)    All other components within normal limits    EKG None  Radiology No results found.  Procedures Procedures    Medications Ordered in ED Medications  sodium chloride 0.9 % bolus 500 mL (500 mLs Intravenous New Bag/Given 07/01/22 1423)  ondansetron (ZOFRAN) injection 4 mg (4 mg Intravenous Given 07/01/22 1424)  oxyCODONE (Oxy IR/ROXICODONE) immediate release tablet 5 mg (5 mg Oral Given 07/01/22 1458)    ED Course/ Medical Decision Making/ A&P                           Medical Decision Making Patient presents as above.  Concern for hyponatremia based on prior labs.  Patient endorses confusion and lightheadedness which would be consistent with hyponatremia.  Patient's abdomen is distended, which is likely causing abdominal pain.  He is afebrile and does not have a leukocytosis.  Paracentesis scheduled with radiology tomorrow.  He requires admission, and should receive this paracentesis inpatient.  Patient is hemodynamically stable, he is not septic.    Amount and/or Complexity of Data Reviewed Independent Historian: spouse External Data Reviewed: labs. Labs: ordered. Decision-making details documented in ED Course.  Risk Prescription drug management. Decision regarding hospitalization.   Repeat labs drawn.  Patient's CMP notable for hyponatremia at 120.   Patient's blood sugar 125.  Chloride slightly low at 88, bicarb slightly low at 20.  Potassium within normal limits.  Creatinine and BUN grossly elevated as well.  Creatinine 1.81, BUN 35.  Concern for acute kidney injury.  Patient's LFTs are also elevated, AST 46, T. bili 2.3.  His T. bili 1 month ago was 2.9.  CBC notable for WBC 9.0, hemoglobin 10.7 which appears stable.  Ammonia slightly elevated at 55, was 64 at last check 1 month ago.  Due to hyponatremia, 500 cc of sodium chloride given.  Patient has cirrhosis with ascites, he will require paracentesis inpatient.  He has an appointment tomorrow with radiology for paracentesis.  Plan to admit patient for management of his hyponatremia and AKI.  I attempted to call the patient's hepatologist at [facility] to update them.  I was unable to reach them in spite of multiple attempts.  The patient's wife has the contact information should the inpatient team want to contact them.        Final Clinical Impression(s) / ED Diagnoses Final diagnoses:  None    Rx / DC Orders ED Discharge Orders     None         Linward Foster, MD 07/01/22 1621    Blane Ohara, MD 07/03/22 [phone]

## 2022-07-01 NOTE — ED Notes (Signed)
Called for the purple man 

## 2022-07-01 NOTE — ED Triage Notes (Addendum)
From home via EMS with c/o feeling dizzy, weak, with vomting yesterday. PCP notified pt that sodium was 118. Pt has a history of liver disease, currently waiting for liver transplant.

## 2022-07-01 NOTE — H&P (Signed)
History and Physical    Andrew Patton ZOX:[id number] DOB: 1961-04-14 DOA: 07/01/2022  PCP: Andrew Brunette, MD (Confirm with patient/family/NH records and if not entered, this has to be entered at [facility] point of entry) Patient coming from: Home  I have personally briefly reviewed patient's old medical records in [facility]  Chief Complaint: Feeling overloaded,  HPI: Andrew Patton is a 62 y.o. male with medical history significant of recurrent decompensated hepatic cirrhosis secondary to alcohol abuse (remain sober since 2022), recent SBP, portal hypertension, recurrent ascites requiring weekly paracentesis, chronic hyponatremia, seizure disorder, anxiety/depression, umbilical hernia, sent from [facility] for evaluation of worsening of hyponatremia and AKI.  Patient was recently hospitalized for SBP AKI and hepatorenal syndrome.  He was successfully treated, AKI resolved, and patient discharged home with prophylactic antibiotics for SBP.  Increasedly, patient has noticed abdominal distention with fluid buildup but denied any peripheral edema, he gained about 3 pounds over the last few days.  But he denies any abdominal pain diarrhea fever or chills.  Yesterday he went to see GI who did a outpatient blood work showed sodium 118 recurrent AKI, and he was told to come to ED for further evaluation.  Did not his despite on Lasix, his urine output has been gradually reduced and very concentrated. ED Course: Afebrile, blood pressure borderline low, no tachycardia no hypoxia.  Repeat labs showed AKI with creatinine 1.8 compared to 1.2 on last admission, sodium 120 compared to 118 yesterday, K3.9, bicarb 20.  INR 1.5, albumin 3.2.  Patient was given 1 dose of 500 mL IV bolus.  Review of Systems: As per HPI otherwise 14 point review of systems negative.    Past Medical History:  Diagnosis Date   Allergy    Anxiety    Arthritis    Ascites    Diabetes (HCC)    Elevated transaminase level     Fatigue    Fatty liver    Hyperlipidemia    Hypertension    "under control", not on any blood pressure medication, has lost 60 lbs   Hypothyroidism    on synthroid   Inguinal muscle strain    Insomnia    Left knee pain    "long time ago"   Multinodular goiter    Obesity    Pneumonia    Psoriasis    Tinnitus     Past Surgical History:  Procedure Laterality Date   APPENDECTOMY  1976   FOOT SURGERY Bilateral 1975   IR PARACENTESIS  11/25/2021   IR PARACENTESIS  12/02/2021   IR PARACENTESIS  12/17/2021   IR PARACENTESIS  01/05/2022   IR PARACENTESIS  02/04/2022   IR PARACENTESIS  02/15/2022   IR PARACENTESIS  03/03/2022   IR PARACENTESIS  03/10/2022   IR PARACENTESIS  03/16/2022   IR PARACENTESIS  03/23/2022   IR PARACENTESIS  03/29/2022   IR PARACENTESIS  04/06/2022   IR PARACENTESIS  04/20/2022   IR PARACENTESIS  05/04/2022   IR PARACENTESIS  05/20/2022   IR PARACENTESIS  05/31/2022   IR PARACENTESIS  06/11/2022   IR PARACENTESIS  06/18/2022   IR PARACENTESIS  06/25/2022   IR RADIOLOGIST EVAL & MGMT  12/17/2021   IR RADIOLOGIST EVAL & MGMT  12/31/2021   NOSE SURGERY     ROTATOR CUFF REPAIR Left    left   SHOULDER SURGERY Right 12/20/2019   [facility]      reports that he quit smoking about 28 years ago.  His smoking use included cigarettes. He has quit using smokeless tobacco.  His smokeless tobacco use included chew. He reports that he does not currently use alcohol after a past usage of about 2.0 standard drinks of alcohol per week. He reports that he does not use drugs.  Allergies  Allergen Reactions   Fentanyl Shortness Of Breath   Crestor [Rosuvastatin Calcium] Other (See Comments)    Elevated liver enzymes   Meloxicam Other (See Comments)    Leg pain   Rosuvastatin Other (See Comments)    Elevated liver enzymes   Ciprofloxacin Rash   Penicillins Rash    Has patient had a PCN reaction causing immediate rash, facial/tongue/throat swelling, SOB or  lightheadedness with hypotension: No Has patient had a PCN reaction causing severe rash involving mucus membranes or skin necrosis: No Has patient had a PCN reaction that required hospitalization: No Has patient had a PCN reaction occurring within the last 10 years: No If all of the above answers are "NO", then may proceed with Cephalosporin use. Other reaction(s): rash    Family History  Problem Relation Age of Onset   Breast cancer Mother    Hyperlipidemia Mother    Other Mother        ? form of parkinson's    Parkinsonism Mother    Bladder Cancer Father    Parkinson's disease Father    Memory loss Maternal Grandmother        in her 12s   Birth defects Maternal Grandfather    Birth defects Paternal Grandmother    Colon cancer Neg Hx    Esophageal cancer Neg Hx    Rectal cancer Neg Hx    Stomach cancer Neg Hx    Colon polyps Neg Hx    Dementia Neg Hx    Alzheimer's disease Neg Hx      Prior to Admission medications   Medication Sig Start Date End Date Taking? Authorizing Provider  busPIRone (BUSPAR) 15 MG tablet Take 7.5-15 mg by mouth 2 (two) times daily. Take 1/2 tablet (7.5 mg) in the morning and Take 1 tablet (15 mg) at supper    [provider]  clonazePAM (KLONOPIN) 0.5 MG tablet Take 0.5 mg by mouth at bedtime.    [provider]  colesevelam (WELCHOL) 625 MG tablet Take 625 mg by mouth daily with breakfast.    [provider]  escitalopram (LEXAPRO) 5 MG tablet Take 5 mg by mouth daily. 02/14/22   [provider]  eszopiclone (LUNESTA) 2 MG TABS tablet Take 2 mg by mouth at bedtime. Take immediately before bedtime    [provider]  ezetimibe (ZETIA) 10 MG tablet Take 10 mg by mouth daily.    [provider]  fluticasone (FLONASE) 50 MCG/ACT nasal spray Place 1 spray into both nostrils at bedtime. 01/05/13   [provider]  folic acid (FOLVITE) 1 MG tablet Take 1 mg by mouth at bedtime.    [provider]  furosemide (LASIX) 80 MG tablet Take 1 tablet (80 mg total) by mouth daily. 05/17/22   Cirigliano, Vito V, DO  ipratropium (ATROVENT) 0.06 % nasal spray Place 1 spray into the nose daily as needed for rhinitis. 02/17/19   [provider]  lactulose (CHRONULAC) 10 GM/15ML solution Take 15 mLs (10 g total) by mouth 2 (two) times daily as needed for mild constipation (for bowel movments 2-3 per day). 07/06/21   Pokhrel, Rebekah Chesterfield, MD  levETIRAcetam (KEPPRA XR) 500 MG 24 hr  tablet Take 2 tablets (1,000 mg total) by mouth daily. Patient taking differently: Take 1,000 mg by mouth at bedtime. 12/10/21   Anson Fret, MD  levothyroxine (SYNTHROID) 88 MCG tablet Take 88 mcg by mouth every morning. 02/14/22   [provider]  ondansetron (ZOFRAN ODT) 4 MG disintegrating tablet Take 1 tablet (4 mg total) by mouth every 8 (eight) hours as needed for nausea or vomiting. Patient taking differently: Take 4 mg by mouth every 8 (eight) hours as needed for nausea or vomiting (dissolve orally). 02/08/20   Maxwell Caul, PA-C  pantoprazole (PROTONIX) 20 MG tablet Take 1 tablet (20 mg total) by mouth daily. 06/01/22   Tyrone Nine, MD  rifaximin (XIFAXAN) 550 MG TABS tablet Take 1 tablet (550 mg total) by mouth 2 (two) times daily. 12/15/21   Cirigliano, Vito V, DO  spironolactone (ALDACTONE) 100 MG tablet Take 1 tablet (100 mg total) by mouth daily. Take 1 tablet with 50 mg tablet (150 mg total) by mouth daily. Patient taking differently: Take 150 mg by mouth daily. Taking 1 & 1/2 tabs = 150 mg daily 12/08/21   Cirigliano, Vito V, DO  sulfamethoxazole-trimethoprim (BACTRIM DS) 800-160 MG tablet Take 1 tablet by mouth daily. 06/16/22   Cirigliano, Vito V, DO  thiamine 100 MG tablet Take 1 tablet (100 mg total) by mouth daily. 07/06/21   Pokhrel, Rebekah Chesterfield, MD  zinc sulfate 220 (50 Zn) MG capsule Take by mouth. 06/04/22 06/04/23  [provider]    Physical Exam: Vitals:   07/01/22  1125 07/01/22 1130 07/01/22 1450 07/01/22 1644  BP:  (!) 123/57 (!) 117/50 113/77  Pulse:  82 84 66  Resp:  16 16 16   Temp:  98.2 F (36.8 C) 97.8 F (36.6 C)   TempSrc:  Oral Oral   SpO2:  100% 100% 100%  Weight: 69.9 kg     Height: 5\' 6"  (1.676 m)       Constitutional: NAD, calm, comfortable Vitals:   07/01/22 1125 07/01/22 1130 07/01/22 1450 07/01/22 1644  BP:  (!) 123/57 (!) 117/50 113/77  Pulse:  82 84 66  Resp:  16 16 16   Temp:  98.2 F (36.8 C) 97.8 F (36.6 C)   TempSrc:  Oral Oral   SpO2:  100% 100% 100%  Weight: 69.9 kg     Height: 5\' 6"  (1.676 m)      Eyes: PERRL, lids and conjunctivae normal ENMT: Mucous membranes are moist. Posterior pharynx clear of any exudate or lesions.Normal dentition.  Neck: normal, supple, no masses, no thyromegaly Respiratory: clear to auscultation bilaterally, no wheezing, no crackles. Normal respiratory effort. No accessory muscle use.  Cardiovascular: Regular rate and rhythm, no murmurs / rubs / gallops. No extremity edema. 2+ pedal pulses. No carotid bruits.  Abdomen: Distended with positive ascites sign, but nontender, no masses palpated. No hepatosplenomegaly. Bowel sounds positive.  Musculoskeletal: no clubbing / cyanosis. No joint deformity upper and lower extremities. Good ROM, no contractures. Normal muscle tone.  Skin: no rashes, lesions, ulcers. No induration Neurologic: CN 2-12 grossly intact. Sensation intact, DTR normal. Strength 5/5 in all 4.  Psychiatric: Normal judgment and insight. Alert and oriented x 3. Normal mood.     Labs on Admission: I have personally reviewed following labs and imaging studies  CBC: Recent Labs  Lab 07/01/22 1148  WBC 9.0  NEUTROABS 6.3  HGB 10.7*  HCT RESULTS UNAVAILABLE DUE TO INTERFERING SUBSTANCE  MCV RESULTS UNAVAILABLE DUE TO INTERFERING SUBSTANCE  PLT 286   Basic Metabolic Panel: Recent Labs  Lab 06/30/22 1400 07/01/22 1148  NA 118* 120*  K 3.6 3.9  CL 85* 88*  CO2 22  20*  GLUCOSE 123* 125*  BUN 39* 35*  CREATININE 1.73* 1.81*  CALCIUM 8.7 8.6*   GFR: Estimated Creatinine Clearance: 38.7 mL/min (A) (by C-G formula based on SCr of 1.81 mg/dL (H)). Liver Function Tests: Recent Labs  Lab 07/01/22 1148  AST 46*  ALT 25  ALKPHOS 92  BILITOT 2.3*  PROT 6.3*  ALBUMIN 3.6   No results for input(s): "LIPASE", "AMYLASE" in the last 168 hours. Recent Labs  Lab 07/01/22 1400  AMMONIA 55*   Coagulation Profile: No results for input(s): "INR", "PROTIME" in the last 168 hours. Cardiac Enzymes: No results for input(s): "CKTOTAL", "CKMB", "CKMBINDEX", "TROPONINI" in the last 168 hours. BNP (last 3 results) No results for input(s): "PROBNP" in the last 8760 hours. HbA1C: No results for input(s): "HGBA1C" in the last 72 hours. CBG: No results for input(s): "GLUCAP" in the last 168 hours. Lipid Profile: No results for input(s): "CHOL", "HDL", "LDLCALC", "TRIG", "CHOLHDL", "LDLDIRECT" in the last 72 hours. Thyroid Function Tests: No results for input(s): "TSH", "T4TOTAL", "FREET4", "T3FREE", "THYROIDAB" in the last 72 hours. Anemia Panel: No results for input(s): "VITAMINB12", "FOLATE", "FERRITIN", "TIBC", "IRON", "RETICCTPCT" in the last 72 hours. Urine analysis:    Component Value Date/Time   COLORURINE YELLOW 05/28/2022 1534   APPEARANCEUR CLEAR 05/28/2022 1534   APPEARANCEUR Clear 11/13/2020 1333   LABSPEC 1.009 05/28/2022 1534   PHURINE 5.0 05/28/2022 1534   GLUCOSEU NEGATIVE 05/28/2022 1534   HGBUR NEGATIVE 05/28/2022 1534   BILIRUBINUR NEGATIVE 05/28/2022 1534   BILIRUBINUR Negative 11/13/2020 1333   KETONESUR NEGATIVE 05/28/2022 1534   PROTEINUR NEGATIVE 05/28/2022 1534   NITRITE NEGATIVE 05/28/2022 1534   LEUKOCYTESUR NEGATIVE 05/28/2022 1534    Radiological Exams on Admission: No results found.  EKG: Ordered  Assessment/Plan Principal Problem:   Hepatorenal syndrome (HCC) Active Problems:   AKI (acute kidney injury) (HCC)    Chronic hyponatremia   Decompensated hepatic cirrhosis (HCC)  (please populate well all problems here in Problem List. (For example, if patient is on BP meds at home and you resume or decide to hold them, it is a problem that needs to be her. Same for CAD, COPD, HLD and so on)   Acute on chronic hyponatremia -Fluid overload -Clinically suspect hyponatremia is part of the hepatorenal syndrome, recurrent -Patient was successfully treated for similar [place] conditions of hyponatremia/AKI/hepatorenal syndrome on last admission with recommendation of vasoconstrictor of midodrine plus Sandostatin drip plus albumin.  For now, will follow last admission regimen of midodrine plus Sandostatin plus albumin. -Discussed briefly with [facility] who will follow with us. -As per family request, also placed a call to [facility] liver transplant team, who recommend I talk to hepatology [facility], as patient is yet to receive liver transplant.  [facility] and waiting for reply. -Fluid restriction  AKI -As above, suspect hepatorenal syndrome, management as above. -Will hold off Bactrim (for SBP prophylaxis) -Hold off Lasix and Aldactone  Decompensated hepatic cirrhosis, with recurrent ascites, hepatorenal syndrome and likely coagulopathy -With hepatorenal syndrome recurrent, low suspicion for SBP given the patient is on continue SBP prophylaxis. -Also with worsening of recurrent ascites, will order IR for paracentesis.  Ordered sample for differential, hold off antibiotics for now, as recurrent SBP is unlikely at this point. -For coagulopathy, check INR, will order vitamin  K -Maddrey's discriminant function= 11, no indication for steroid.  Seizure disorder -Continue Keppra  Anxiety/depression -Continue BuSpar and Klonopin.  Discontinue Lexapro  DVT prophylaxis: SCD Code Status: Full code Family Communication: Wife at bedside Disposition Plan: Patient sick with hepatorenal syndrome  requiring inpatient treatment and inpatient GI consult, expect more than 2 midnight hospital stay. Consults called: [facility] Admission status: Tele admit   Emeline General MD [facility] Pager [phone]  07/01/2022, 5:10 PM

## 2022-07-02 ENCOUNTER — Inpatient Hospital Stay (HOSPITAL_COMMUNITY): Admission: RE | Admit: 2022-07-02 | Payer: BC Managed Care – PPO | Source: Ambulatory Visit

## 2022-07-02 ENCOUNTER — Inpatient Hospital Stay (HOSPITAL_COMMUNITY): Payer: Medicare Other

## 2022-07-02 DIAGNOSIS — N179 Acute kidney failure, unspecified: Secondary | ICD-10-CM | POA: Diagnosis not present

## 2022-07-02 DIAGNOSIS — K7290 Hepatic failure, unspecified without coma: Secondary | ICD-10-CM

## 2022-07-02 DIAGNOSIS — K7682 Hepatic encephalopathy: Secondary | ICD-10-CM

## 2022-07-02 DIAGNOSIS — K7460 Unspecified cirrhosis of liver: Secondary | ICD-10-CM

## 2022-07-02 DIAGNOSIS — E871 Hypo-osmolality and hyponatremia: Secondary | ICD-10-CM

## 2022-07-02 DIAGNOSIS — Z8619 Personal history of other infectious and parasitic diseases: Secondary | ICD-10-CM

## 2022-07-02 DIAGNOSIS — K767 Hepatorenal syndrome: Secondary | ICD-10-CM | POA: Diagnosis not present

## 2022-07-02 DIAGNOSIS — R188 Other ascites: Secondary | ICD-10-CM

## 2022-07-02 HISTORY — PX: IR PARACENTESIS: IMG2679

## 2022-07-02 LAB — BODY FLUID CELL COUNT WITH DIFFERENTIAL
Eos, Fluid: 0 %
Lymphs, Fluid: 57 %
Monocyte-Macrophage-Serous Fluid: 38 % — ABNORMAL LOW (ref 50–90)
Neutrophil Count, Fluid: 5 % (ref 0–25)
Total Nucleated Cell Count, Fluid: 47 cu mm (ref 0–1000)

## 2022-07-02 LAB — OSMOLALITY: Osmolality: 260 mOsm/kg — ABNORMAL LOW (ref 275–295)

## 2022-07-02 LAB — COMPREHENSIVE METABOLIC PANEL
ALT: 22 U/L (ref 0–44)
AST: 33 U/L (ref 15–41)
Albumin: 3.3 g/dL — ABNORMAL LOW (ref 3.5–5.0)
Alkaline Phosphatase: 71 U/L (ref 38–126)
Anion gap: 13 (ref 5–15)
BUN: 38 mg/dL — ABNORMAL HIGH (ref 8–23)
CO2: 20 mmol/L — ABNORMAL LOW (ref 22–32)
Calcium: 8.5 mg/dL — ABNORMAL LOW (ref 8.9–10.3)
Chloride: 85 mmol/L — ABNORMAL LOW (ref 98–111)
Creatinine, Ser: 2.01 mg/dL — ABNORMAL HIGH (ref 0.61–1.24)
GFR, Estimated: 37 mL/min — ABNORMAL LOW (ref >60)
Glucose, Bld: 158 mg/dL — ABNORMAL HIGH (ref 70–99)
Potassium: 3.9 mmol/L (ref 3.5–5.1)
Sodium: 118 mmol/L — CL (ref 135–145)
Total Bilirubin: 2.5 mg/dL — ABNORMAL HIGH (ref 0.3–1.2)
Total Protein: 5.4 g/dL — ABNORMAL LOW (ref 6.5–8.1)

## 2022-07-02 LAB — GRAM STAIN: Gram Stain: NONE SEEN

## 2022-07-02 LAB — PROTIME-INR
INR: 1.5 — ABNORMAL HIGH (ref 0.8–1.2)
Prothrombin Time: 17.8 seconds — ABNORMAL HIGH (ref 11.4–15.2)

## 2022-07-02 LAB — LACTATE DEHYDROGENASE, PLEURAL OR PERITONEAL FLUID: LD, Fluid: <25 U/L — ABNORMAL HIGH (ref 3–23)

## 2022-07-02 MED ORDER — MIDODRINE HCL 5 MG PO TABS
5.0000 mg | ORAL_TABLET | Freq: Two times a day (BID) | ORAL | Status: DC
  Administered 2022-07-02: 5 mg via ORAL
  Filled 2022-07-02: qty 1

## 2022-07-02 MED ORDER — SODIUM CHLORIDE 0.9 % IV SOLN
2.0000 g | INTRAVENOUS | Status: DC
  Administered 2022-07-02 – 2022-07-07 (×6): 2 g via INTRAVENOUS
  Filled 2022-07-02 (×6): qty 20

## 2022-07-02 MED ORDER — MIDODRINE HCL 5 MG PO TABS: 10.0000 mg | ORAL_TABLET | Freq: Two times a day (BID) | ORAL | Status: DC

## 2022-07-02 MED ORDER — MORPHINE SULFATE (PF) 2 MG/ML IV SOLN: 0.5000 mg | Freq: Once | INTRAVENOUS | Status: DC

## 2022-07-02 MED ORDER — ZINC SULFATE 220 (50 ZN) MG PO CAPS
220.0000 mg | ORAL_CAPSULE | Freq: Every day | ORAL | Status: DC
  Administered 2022-07-02: 220 mg via ORAL
  Filled 2022-07-02: qty 1

## 2022-07-02 MED ORDER — OXYCODONE HCL 5 MG PO TABS
5.0000 mg | ORAL_TABLET | Freq: Once | ORAL | Status: AC
  Administered 2022-07-02: 5 mg via ORAL
  Filled 2022-07-02: qty 1

## 2022-07-02 MED ORDER — ENOXAPARIN SODIUM 40 MG/0.4ML IJ SOSY
40.0000 mg | PREFILLED_SYRINGE | Freq: Every day | INTRAMUSCULAR | Status: DC
  Administered 2022-07-02 – 2022-07-08 (×7): 40 mg via SUBCUTANEOUS
  Filled 2022-07-02 (×7): qty 0.4

## 2022-07-02 MED ORDER — SODIUM CHLORIDE 0.9 % IV SOLN: INTRAVENOUS | Status: DC

## 2022-07-02 MED ORDER — LIDOCAINE HCL 1 % IJ SOLN
INTRAMUSCULAR | Status: AC
  Filled 2022-07-02: qty 20

## 2022-07-02 MED ORDER — MORPHINE SULFATE (PF) 2 MG/ML IV SOLN
0.2500 mg | Freq: Once | INTRAVENOUS | Status: AC
  Administered 2022-07-02: 0.25 mg via INTRAVENOUS
  Filled 2022-07-02: qty 1

## 2022-07-02 MED ORDER — MIDODRINE HCL 5 MG PO TABS
15.0000 mg | ORAL_TABLET | Freq: Three times a day (TID) | ORAL | Status: DC
  Administered 2022-07-03 – 2022-07-08 (×16): 15 mg via ORAL
  Filled 2022-07-02 (×16): qty 3

## 2022-07-02 MED ORDER — MIDODRINE HCL 5 MG PO TABS
7.5000 mg | ORAL_TABLET | Freq: Three times a day (TID) | ORAL | Status: DC
  Administered 2022-07-02: 7.5 mg via ORAL
  Filled 2022-07-02: qty 2

## 2022-07-02 NOTE — Plan of Care (Signed)
  Problem: Skin Integrity: Goal: Risk for impaired skin integrity will decrease Outcome: Progressing   Problem: Education: Goal: Ability to verbalize understanding of medication therapies will improve Outcome: Progressing

## 2022-07-02 NOTE — Consult Note (Signed)
[facility] Renal Consultation Note  Requesting MD: Tiajuana Amass, MD Indication for Consultation:  AKI  Chief complaint: worsening labs  HPI:  Andrew Patton is a 61 y.o. male with a history of DM, HTN, and alcoholic cirrhosis complicated by ascites and hepatic encephalopathy who presented to the hospital on 11/30 at the direction of [facility] for worsening of hyponatremia and AKI.  He was found to have sodium of 118-120.  GI was consulted.  He has been treated with albumin, midodrine, and octreotide.  He was referred to transplant hepatology at [facility] and is under consideration - appears to have an outpatient appointment scheduled.  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  GI team has reached out to [facility] and transplant hepatology recommended nephrology consult.  Strict ins/outs are ordered but are not being obtained.  Had one unmeasured urine void charted 11/30 and four charted today.  Note that he had a paracentesis today with IR with "3 removed" (no unit).  The patient tells me they removed 3 liters.  He has a standing order for paracentesis every 1-2 weeks.    Creatinine, Ser  Date/Time Value Ref Range Status  07/02/2022 03:20 AM 2.01 (H) 0.61 - 1.24 mg/dL Final  32/35/5732 20:25 AM 1.81 (H) 0.61 - 1.24 mg/dL Final  42/70/6237 62:83 PM 1.73 (H) 0.40 - 1.50 mg/dL Final  15/17/6160 73:71 AM 1.20 0.61 - 1.24 mg/dL Final  01/26/9484 46:27 AM 1.52 (H) 0.61 - 1.24 mg/dL Final  03/50/0938 18:29 AM 1.57 (H) 0.61 - 1.24 mg/dL Final  93/71/6967 89:38 AM 1.85 (H) 0.61 - 1.24 mg/dL Final  05/18/5101 58:52 AM 1.86 (H) 0.61 - 1.24 mg/dL Final  77/82/4235 36:14 PM 1.86 (H) 0.61 - 1.24 mg/dL Final  43/15/4008 67:61 AM 1.54 (H) 0.61 - 1.24 mg/dL Final  95/04/3266 12:45 PM 1.25 0.40 - 1.50 mg/dL Final  80/99/8338 25:05 AM 0.81 0.61 - 1.24 mg/dL Final  39/76/7341 93:79 AM 0.97 0.61 - 1.24 mg/dL Final  02/40/9735 32:99 AM 1.28 (H) 0.61 - 1.24 mg/dL Final  24/26/8341 96:22 PM 0.91  0.40 - 1.50 mg/dL Final  29/79/8921 19:41 AM 0.95 0.40 - 1.50 mg/dL Final  74/03/1447 18:56 PM 0.90 0.40 - 1.50 mg/dL Final  31/49/7026 37:85 PM 0.76 0.40 - 1.50 mg/dL Final  88/50/2774 12:87 AM 0.92 0.61 - 1.24 mg/dL Final  86/76/7209 47:09 AM 0.98 0.61 - 1.24 mg/dL Final  62/83/6629 47:65 AM 0.97 0.61 - 1.24 mg/dL Final  46/50/3546 56:81 AM 0.96 0.61 - 1.24 mg/dL Final  27/51/7001 74:94 AM 1.04 0.61 - 1.24 mg/dL Final  49/67/5916 38:46 PM 1.01 0.61 - 1.24 mg/dL Final  65/99/3570 17:79 PM 1.15 0.61 - 1.24 mg/dL Final  39/10/90 33:00 AM 0.85 0.76 - 1.27 mg/dL Final  76/22/6333 54:56 AM 0.89 0.76 - 1.27 mg/dL Final  25/63/8937 34:28 PM 0.87 0.40 - 1.50 mg/dL Final  76/81/1572 62:03 AM 0.83 0.61 - 1.24 mg/dL Final  55/97/4163 84:53 PM 0.76 0.40 - 1.50 mg/dL Final  64/68/0321 22:48 AM 0.77 0.61 - 1.24 mg/dL Final  25/00/3704 88:89 AM 1.04 0.61 - 1.24 mg/dL Final  16/94/5038 88:28 AM 0.94 0.61 - 1.24 mg/dL Final  00/34/9179 15:05 PM 0.64 0.61 - 1.24 mg/dL Final  69/79/4801 65:53 PM 0.99 0.61 - 1.24 mg/dL Final  74/82/7078 67:54 PM 1.19 0.61 - 1.24 mg/dL Final     PMHx:   Past Medical History:  Diagnosis Date   Allergy    Anxiety    Arthritis  Ascites    Diabetes (HCC)    Elevated transaminase level    Fatigue    Fatty liver    Hyperlipidemia    Hypertension    "under control", not on any blood pressure medication, has lost 60 lbs   Hypothyroidism    on synthroid   Inguinal muscle strain    Insomnia    Left knee pain    "long time ago"   Multinodular goiter    Obesity    Pneumonia    Psoriasis    Tinnitus     Past Surgical History:  Procedure Laterality Date   APPENDECTOMY  1976   FOOT SURGERY Bilateral 1975   IR PARACENTESIS  11/25/2021   IR PARACENTESIS  12/02/2021   IR PARACENTESIS  12/17/2021   IR PARACENTESIS  01/05/2022   IR PARACENTESIS  02/04/2022   IR PARACENTESIS  02/15/2022   IR PARACENTESIS  03/03/2022   IR PARACENTESIS  03/10/2022   IR PARACENTESIS   03/16/2022   IR PARACENTESIS  03/23/2022   IR PARACENTESIS  03/29/2022   IR PARACENTESIS  04/06/2022   IR PARACENTESIS  04/20/2022   IR PARACENTESIS  05/04/2022   IR PARACENTESIS  05/20/2022   IR PARACENTESIS  05/31/2022   IR PARACENTESIS  06/11/2022   IR PARACENTESIS  06/18/2022   IR PARACENTESIS  06/25/2022   IR PARACENTESIS  07/02/2022   IR RADIOLOGIST EVAL & MGMT  12/17/2021   IR RADIOLOGIST EVAL & MGMT  12/31/2021   NOSE SURGERY     ROTATOR CUFF REPAIR Left    left   SHOULDER SURGERY Right 12/20/2019   [place]     Family Hx:  Family History  Problem Relation Age of Onset   Breast cancer Mother    Hyperlipidemia Mother    Other Mother        ? form of parkinson's    Parkinsonism Mother    Bladder Cancer Father    Parkinson's disease Father    Memory loss Maternal Grandmother        in her 28s   Birth defects Maternal Grandfather    Birth defects Paternal Grandmother    Colon cancer Neg Hx    Esophageal cancer Neg Hx    Rectal cancer Neg Hx    Stomach cancer Neg Hx    Colon polyps Neg Hx    Dementia Neg Hx    Alzheimer's disease Neg Hx     Social History:  reports that he quit smoking about 28 years ago. His smoking use included cigarettes. He has quit using smokeless tobacco.  His smokeless tobacco use included chew. He reports that he does not currently use alcohol after a past usage of about 2.0 standard drinks of alcohol per week. He reports that he does not use drugs.  Allergies:  Allergies  Allergen Reactions   Fentanyl Shortness Of Breath   Meloxicam Other (See Comments)    Leg pain   Rosuvastatin Other (See Comments)    Elevated liver enzymes   Wound Dressing Adhesive     Tape causing abrasions on his skin    Ciprofloxacin Rash   Penicillins Rash    Has patient had a PCN reaction causing immediate rash, facial/tongue/throat swelling, SOB or lightheadedness with hypotension: No Has patient had a PCN reaction causing severe rash involving  mucus membranes or skin necrosis: No Has patient had a PCN reaction that required hospitalization: No Has patient had a PCN reaction occurring within the last 10 years:  No If all of the above answers are "NO", then may proceed with Cephalosporin use. Other reaction(s): rash    Medications: Prior to Admission medications   Medication Sig Start Date End Date Taking? Authorizing Provider  busPIRone (BUSPAR) 15 MG tablet Take 7.5-15 mg by mouth 2 (two) times daily. Take 1/2 tablet (7.5 mg) in the morning and Take 1 tablet (15 mg) at supper   Yes [provider]  clonazePAM (KLONOPIN) 0.5 MG tablet Take 0.5 mg by mouth at bedtime.   Yes [provider]  colesevelam (WELCHOL) 625 MG tablet Take 625 mg by mouth daily with breakfast.   Yes [provider]  Emollient (AQUAPHOR ADV THERAPY HEALING) OINT Place 1 Application into left nostril 2 (two) times daily.   Yes [provider]  escitalopram (LEXAPRO) 5 MG tablet Take 5 mg by mouth daily. 02/14/22  Yes [provider]  ezetimibe (ZETIA) 10 MG tablet Take 10 mg by mouth daily.   Yes [provider]  folic acid (FOLVITE) 1 MG tablet Take 1 mg by mouth at bedtime.   Yes [provider]  furosemide (LASIX) 80 MG tablet Take 1 tablet (80 mg total) by mouth daily. 05/17/22  Yes Cirigliano, Vito V, DO  lactulose (CHRONULAC) 10 GM/15ML solution Take 15 mLs (10 g total) by mouth 2 (two) times daily as needed for mild constipation (for bowel movments 2-3 per day). Patient taking differently: Take 10 g by mouth 2 (two) times daily as needed for mild constipation (for bowel movments 2-3 per day). Pt gives usually between 7- 10 mls as needed 07/06/21  Yes Pokhrel, Laxman, MD  levETIRAcetam (KEPPRA XR) 500 MG 24 hr tablet Take 2 tablets (1,000 mg total) by mouth daily. Patient taking differently: Take 1,000 mg by mouth at bedtime. 12/10/21  Yes Melvenia Beam, MD  levothyroxine (SYNTHROID) 88 MCG  tablet Take 88 mcg by mouth every morning. 02/14/22  Yes [provider]  ondansetron (ZOFRAN ODT) 4 MG disintegrating tablet Take 1 tablet (4 mg total) by mouth every 8 (eight) hours as needed for nausea or vomiting. Patient taking differently: Take 4 mg by mouth every 8 (eight) hours as needed for nausea or vomiting (dissolve orally). 02/08/20  Yes Volanda Napoleon, PA-C  pantoprazole (PROTONIX) 20 MG tablet Take 1 tablet (20 mg total) by mouth daily. 06/01/22  Yes [name], MD  rifaximin (XIFAXAN) 550 MG TABS tablet Take 1 tablet (550 mg total) by mouth 2 (two) times daily. 12/15/21  Yes Cirigliano, Vito V, DO  sodium chloride (SALINE MIST) 0.65 % nasal spray Place 1 spray into the nose 3 (three) times daily.   Yes [provider]  spironolactone (ALDACTONE) 100 MG tablet Take 1 tablet (100 mg total) by mouth daily. Take 1 tablet with 50 mg tablet (150 mg total) by mouth daily. Patient taking differently: Take 150 mg by mouth daily. Taking 1 & 1/2 tabs = 150 mg daily 12/08/21  Yes Cirigliano, Vito V, DO  sulfamethoxazole-trimethoprim (BACTRIM DS) 800-160 MG tablet Take 1 tablet by mouth daily. 06/16/22  Yes Cirigliano, Vito V, DO  thiamine 100 MG tablet Take 1 tablet (100 mg total) by mouth daily. 07/06/21  Yes Pokhrel, Laxman, MD  traMADol (ULTRAM) 50 MG tablet Take 50 mg by mouth every 6 (six) hours as needed for moderate pain or severe pain.   Yes [provider]  zinc sulfate 220 (50 Zn) MG capsule Take by mouth. 06/04/22 06/04/23 Yes [provider]    I have reviewed the patient's current and reported prior to admission medications.  Labs:     Latest Ref Rng & Units 07/02/2022    3:20 AM 07/01/2022    9:49 PM 07/01/2022   11:48 AM  BMP  Glucose 70 - 99 mg/dL 158   125   BUN 8 - 23 mg/dL 38   35   Creatinine 0.61 - 1.24 mg/dL 2.01   1.81   Sodium 135 - 145 mmol/L 118  119  120   Potassium 3.5 - 5.1 mmol/L 3.9   3.9   Chloride 98 - 111 mmol/L 85   88    CO2 22 - 32 mmol/L 20   20   Calcium 8.9 - 10.3 mg/dL 8.5   8.6     Urinalysis    Component Value Date/Time   COLORURINE YELLOW 05/28/2022 [place] 05/28/2022 1534   APPEARANCEUR Clear 11/13/2020 1333   LABSPEC 1.009 05/28/2022 1534   PHURINE 5.0 05/28/2022 1534   GLUCOSEU NEGATIVE 05/28/2022 1534   HGBUR NEGATIVE 05/28/2022 [place] 05/28/2022 1534   BILIRUBINUR Negative 11/13/2020 [place] 05/28/2022 1534   PROTEINUR NEGATIVE 05/28/2022 1534   NITRITE NEGATIVE 05/28/2022 1534   LEUKOCYTESUR NEGATIVE 05/28/2022 1534     ROS:  Pertinent items noted in HPI and remainder of comprehensive ROS otherwise negative.  Physical Exam: Vitals:   07/02/22 1501 07/02/22 2024  BP: (!) 98/43 (!) 108/48  Pulse: 74 89  Resp: 17 18  Temp: 98.9 F (37.2 C) 98.5 F (36.9 C)  SpO2: 99% 100%     General: adult male in bed in NAD   HEENT: NCAT aside from bandage over broken nose Eyes: EOMI sclera with ? small hemorrhages (pt states blew his nose too hard) Neck: supple trachea midline Heart: S1S2 no rub Lungs: clear and unlabored  Abdomen: soft/nt/no distension; thin  Extremities: no edema appreciated; thin Skin: no rash on extremities  Psych normal mood and affect Neuro: alert and oriented x 3 provides hx and follows commands; slightly hard of hearing   Assessment/Plan:  # AKI  - Concerning for hepatorenal syndrome  - Increase midodrine to 15 mg TID - continue octreotide - continue albumin  - Strict ins/outs ordered - not available.  Reinforced importance of same  - Please avoid bactrim - NS at 75 ml/hr x 12 hours  # Hyponatremia  - Setting of peripheral overload/decompensated cirrhosis/presumed hepatorenal syndrome  - not a candidate for tolvaptan  - renal panel in AM  - Please hold spironolactone - Please hold bactrim  - follow after normal saline   # Cirrhosis - decompensated and with ascites and encephalopathy   - paracentesis per GI discretion  - on lactulose and rifaximin   # HTN  - hx of such  - now hypotensive and on midodrine directed at presumed hepatorenal syndrome   # Macrocytic anemia  - iron low at 37 but 36% sat last check.  B12 and folate ok 05/2022  Thank you for the consult.  Please do not hesitate to contact me with any questions regarding our patient.   Claudia Desanctis 07/02/2022, 10:47 PM

## 2022-07-02 NOTE — Progress Notes (Signed)
Mobility Specialist - Progress Note   07/02/22 1023  Mobility  Activity Ambulated with assistance in hallway  Level of Assistance Standby assist, set-up cues, supervision of patient - no hands on  Assistive Device Cane  Distance Ambulated (ft) 200 ft  Activity Response Tolerated well  $Mobility charge 1 Mobility    Pt received in bed agreeable to mobility. Requested afternoon walk. Will follow up as schedule allows. Left in bed w/ call bell in reach and all needs met.   [place] Specialist Please contact via SecureChat or Rehab office at [phone]

## 2022-07-02 NOTE — Progress Notes (Signed)
Heart Failure Navigator Progress Note  Assessed for Heart & Vascular TOC clinic readiness.  Patient does not meet criteria due to admission not acute heart failure.   Navigator available for reassessment of patient.   Andrew Patton, BSN, Scientist, clinical (histocompatibility and immunogenetics) Only

## 2022-07-02 NOTE — Progress Notes (Signed)
[facility] PROGRESS NOTE    Progress Note  Andrew Patton  EXB:[id number] DOB: Mar 23, 1961 DOA: 07/01/2022 PCP: [name], MD     Brief Narrative:   Andrew Patton is an 61 y.o. male past medical history of recurrent decompensated hepatic cirrhosis secondary to alcohol abuse, SBP, recurrent ascites with weekly paracentesis, chronic hyponatremia seizure disorder sent from [facility] for worsening hyponatremia and acute kidney injury patient has been recently treated in the hospital for SBP and acute kidney injury discharged home on prophylactic antibiotics started having abdominal distention and lower extremity edema has gained 3 pounds in the last few days he is also been having abdominal pain and unsteady gait.  Labs in the ED showed acute kidney injury of 1.8 (previous lab 1.2) sodium 120 potassium 3.9 bicarb 20.  When he was discharged from the hospital he weighed around 68 kg on admission 72.3 kg.   Assessment/Plan:   Acute on chronic hypervolemic hyponatremia: There was a concern for hepatorenal syndrome. Started on midodrine Sandostatin and albumin. GI has been consulted.  Acute kidney injury/hepatorenal syndrome: Suspect hepatorenal syndrome. Hold Bactrim, Lasix and Aldactone. Despite albumin, midodrine and octreotide his renal function continues to deteriorate.  Abd pain/Decompensated hepatic cirrhosis: With recurrent ascites, his weight is increasing. IR has been consulted for paracentesis due to discomfort and abdominal distention. INR 1.5 platelet count is pending. Start Rocephin, for possible SBP.  Seizure disorder: Continue Keppra.  Anxiety/depression: Continue BuSpar and Klonopin.   DVT prophylaxis: lovenox Family Communication:Wife Status is: Inpatient Remains inpatient appropriate because: Hyponatremia abdominal pain    Code Status:     Code Status Orders  (From admission, onward)           Start     Ordered   07/01/22 1704  Full  code  Continuous        07/01/22 1705           Code Status History     Date Active Date Inactive Code Status Order ID Comments User Context   05/27/2022 1927 06/02/2022 0149 Full Code [id number]  Charlsie Quest, MD ED   02/19/2022 2122 02/22/2022 1907 Full Code [id number]  Hillary Bow, DO Inpatient   07/02/2021 2128 07/06/2021 1842 Full Code [id number]  Anselm Jungling, DO ED   03/07/2019 2356 03/09/2019 2048 Full Code [id number]  Pearson Grippe, MD ED      Advance Directive Documentation    Flowsheet Row Most Recent Value  Type of Advance Directive Healthcare Power of Attorney  Pre-existing out of facility DNR order (yellow form or pink MOST form) --  "MOST" Form in Place? --         IV Access:   Peripheral IV   Procedures and diagnostic studies:   No results found.   Medical Consultants:   None.   Subjective:    Andrew Patton continues to complain of abdominal pain nauseated no appetite  Objective:    Vitals:   07/01/22 2128 07/02/22 0003 07/02/22 0332 07/02/22 0500  BP: (!) 124/53 (!) 114/54 (!) 107/51   Pulse: 77 81 77   Resp: 20 16    Temp: 98.1 F (36.7 C) 98.1 F (36.7 C) 98 F (36.7 C)   TempSrc: Oral Oral Oral   SpO2: 100% 100% 100%   Weight:    72.3 kg  Height:       SpO2: 100 %  No intake or output data in the 24 hours ending 07/02/22 0720 [employer]  07/01/22 1125 07/02/22 0500  Weight: 69.9 kg 72.3 kg    Exam: General exam: In no acute distress. Respiratory system: Good air movement and clear to auscultation. Cardiovascular system: S1 & S2 heard, RRR. No JVD. Gastrointestinal system: Umbilical hernia, distended diffuse tenderness no rebound or guarding Extremities: No pedal edema. Skin: No rashes, lesions or ulcers Psychiatry: Judgement and insight appear normal. Mood & affect appropriate.    Data Reviewed:    Labs: Basic Metabolic Panel: Recent Labs  Lab 06/30/22 1400 07/01/22 1148 07/01/22 2147-11-19 07/02/22 0320  NA  118* 120* 119* 118*  K 3.6 3.9  --  3.9  CL 85* 88*  --  85*  CO2 22 20*  --  20*  GLUCOSE 123* 125*  --  158*  BUN 39* 35*  --  38*  CREATININE 1.73* 1.81*  --  2.01*  CALCIUM 8.7 8.6*  --  8.5*   GFR Estimated Creatinine Clearance: 34.8 mL/min (A) (by C-G formula based on SCr of 2.01 mg/dL (H)). Liver Function Tests: Recent Labs  Lab 07/01/22 1148 07/02/22 0320  AST 46* 33  ALT 25 22  ALKPHOS 92 71  BILITOT 2.3* 2.5*  PROT 6.3* 5.4*  ALBUMIN 3.6 3.3*   No results for input(s): "LIPASE", "AMYLASE" in the last 168 hours. Recent Labs  Lab 07/01/22 1400  AMMONIA 55*   Coagulation profile Recent Labs  Lab 07/01/22 Nov 19, 2147 07/02/22 0320  INR 1.4* 1.5*   COVID-19 Labs  No results for input(s): "DDIMER", "FERRITIN", "LDH", "CRP" in the last 72 hours.  Lab Results  Component Value Date   SARSCOV2NAA NEGATIVE 07/02/2021   SARSCOV2NAA NEGATIVE 03/07/2019    CBC: Recent Labs  Lab 07/01/22 1148  WBC 9.0  NEUTROABS 6.3  HGB 10.7*  HCT RESULTS UNAVAILABLE DUE TO INTERFERING SUBSTANCE  MCV RESULTS UNAVAILABLE DUE TO INTERFERING SUBSTANCE  PLT 286   Cardiac Enzymes: No results for input(s): "CKTOTAL", "CKMB", "CKMBINDEX", "TROPONINI" in the last 168 hours. BNP (last 3 results) No results for input(s): "PROBNP" in the last 8760 hours. CBG: No results for input(s): "GLUCAP" in the last 168 hours. D-Dimer: No results for input(s): "DDIMER" in the last 72 hours. Hgb A1c: No results for input(s): "HGBA1C" in the last 72 hours. Lipid Profile: No results for input(s): "CHOL", "HDL", "LDLCALC", "TRIG", "CHOLHDL", "LDLDIRECT" in the last 72 hours. Thyroid function studies: Recent Labs    07/01/22 11/19/2147  TSH 1.477   Anemia work up: No results for input(s): "VITAMINB12", "FOLATE", "FERRITIN", "TIBC", "IRON", "RETICCTPCT" in the last 72 hours. Sepsis Labs: Recent Labs  Lab 07/01/22 1148  WBC 9.0   Microbiology No results found for this or any previous visit (from  the past 240 hour(s)).   Medications:    busPIRone  15 mg Oral Q supper   busPIRone  7.5 mg Oral Daily   clonazePAM  0.5 mg Oral QHS   colesevelam  625 mg Oral Q lunch   ezetimibe  10 mg Oral Daily   fluticasone  1 spray Each Nare QHS   folic acid  1 mg Oral QHS   levETIRAcetam  1,000 mg Oral Daily   levothyroxine  88 mcg Oral Q0600   midodrine  2.5 mg Oral BID WC   pantoprazole  20 mg Oral Daily   rifaximin  550 mg Oral BID   sodium chloride flush  3 mL Intravenous Q12H   thiamine  100 mg Oral Daily   Continuous Infusions:  sodium chloride     albumin  human 25 g (07/02/22 0508)   octreotide (SANDOSTATIN) 500 mcg in sodium chloride 0.9 % 250 mL (2 mcg/mL) infusion 50 mcg/hr (07/02/22 0537)      LOS: 1 day   Marinda Elk  [facility]  07/02/2022, 7:20 AM

## 2022-07-02 NOTE — Procedures (Signed)
PROCEDURE SUMMARY:  Successful ultrasound guided paracentesis from the left lower quadrant.  Yielded 3 of cloudy yellow fluid.  No immediate complications.  The patient tolerated the procedure well.   Specimen was sent for labs.  EBL < 15mL  The patient has previously been formally evaluated by the [facility] and is being actively followed for potential future intervention.

## 2022-07-03 DIAGNOSIS — Z880 Allergy status to penicillin: Secondary | ICD-10-CM

## 2022-07-03 DIAGNOSIS — K767 Hepatorenal syndrome: Secondary | ICD-10-CM | POA: Diagnosis not present

## 2022-07-03 DIAGNOSIS — T368X5A Adverse effect of other systemic antibiotics, initial encounter: Secondary | ICD-10-CM

## 2022-07-03 DIAGNOSIS — K652 Spontaneous bacterial peritonitis: Secondary | ICD-10-CM | POA: Diagnosis not present

## 2022-07-03 LAB — RENAL FUNCTION PANEL
Albumin: 3.6 g/dL (ref 3.5–5.0)
Anion gap: 10 (ref 5–15)
BUN: 26 mg/dL — ABNORMAL HIGH (ref 8–23)
CO2: 21 mmol/L — ABNORMAL LOW (ref 22–32)
Calcium: 8.7 mg/dL — ABNORMAL LOW (ref 8.9–10.3)
Chloride: 95 mmol/L — ABNORMAL LOW (ref 98–111)
Creatinine, Ser: 1.54 mg/dL — ABNORMAL HIGH (ref 0.61–1.24)
GFR, Estimated: 51 mL/min — ABNORMAL LOW (ref >60)
Glucose, Bld: 122 mg/dL — ABNORMAL HIGH (ref 70–99)
Phosphorus: 3.2 mg/dL (ref 2.5–4.6)
Potassium: 3.6 mmol/L (ref 3.5–5.1)
Sodium: 126 mmol/L — ABNORMAL LOW (ref 135–145)

## 2022-07-03 LAB — BASIC METABOLIC PANEL
Anion gap: 12 (ref 5–15)
BUN: 26 mg/dL — ABNORMAL HIGH (ref 8–23)
CO2: 18 mmol/L — ABNORMAL LOW (ref 22–32)
Calcium: 8.2 mg/dL — ABNORMAL LOW (ref 8.9–10.3)
Chloride: 94 mmol/L — ABNORMAL LOW (ref 98–111)
Creatinine, Ser: 1.41 mg/dL — ABNORMAL HIGH (ref 0.61–1.24)
GFR, Estimated: 57 mL/min — ABNORMAL LOW (ref >60)
Glucose, Bld: 143 mg/dL — ABNORMAL HIGH (ref 70–99)
Potassium: 3.6 mmol/L (ref 3.5–5.1)
Sodium: 124 mmol/L — ABNORMAL LOW (ref 135–145)

## 2022-07-03 LAB — CORTISOL-AM, BLOOD: Cortisol - AM: 3.9 ug/dL — ABNORMAL LOW (ref 6.7–22.6)

## 2022-07-03 MED ORDER — MORPHINE SULFATE (PF) 2 MG/ML IV SOLN: 1.0000 mg | INTRAVENOUS | Status: DC | PRN

## 2022-07-03 MED ORDER — OXYCODONE HCL 5 MG PO TABS
10.0000 mg | ORAL_TABLET | ORAL | Status: DC | PRN
  Administered 2022-07-03 – 2022-07-08 (×6): 10 mg via ORAL
  Filled 2022-07-03 (×7): qty 2

## 2022-07-03 NOTE — Progress Notes (Signed)
[facility] Progress Note  Name: Andrew Patton MRN: [id number] DOB: 26-Feb-1961  Chief Complaint:  Presented due to worsened labs  Subjective:  Strict ins/outs are not available.  Spoke with GI and primary team this AM.  Midodrine was increased and he was given NS at 75 ml/hr x 12 hours. Spoke with his wife at bedside.     Review of systems:  Denies shortness of breath or chest pain  Nausea better  Per his wife he has baseline confusion about time and the year - he misses this question today and she states this is his norm ------------  Background on consult:  Andrew Patton is a 61 y.o. male with a history of DM, HTN, and alcoholic cirrhosis complicated by ascites and hepatic encephalopathy who presented to the hospital on 11/30 at the direction of  GI for worsening of hyponatremia and AKI.  He was found to have sodium of 118-120.  GI was consulted.  He has been treated with albumin, midodrine, and octreotide.  He was referred to transplant hepatology at [facility] and is under consideration - appears to have an outpatient appointment scheduled.  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  GI team has reached out to [facility] and transplant hepatology recommended nephrology consult.  Strict ins/outs are ordered but are not being obtained.  Had one unmeasured urine void charted 11/30 and four charted today.  Note that he had a paracentesis today with IR with "3 removed" (no unit).  The patient tells me they removed 3 liters.  He has a standing order for paracentesis every 1-2 weeks.       Intake/Output Summary (Last 24 hours) at 07/03/2022 1136 Last data filed at 07/03/2022 0418 Gross per 24 hour  Intake 2217.45 ml  Output --  Net 2217.45 ml    Vitals:  Vitals:   07/02/22 2024 07/03/22 0610 07/03/22 0611 07/03/22 0803  BP: (!) 108/48 (!) 112/56  (!) 113/55  Pulse: 89 81  78  Resp: 18 18  17   Temp: 98.5 F (36.9 C) 98.7 F (37.1 C)  99 F (37.2 C)  TempSrc: Oral  Oral  Oral  SpO2: 100% 99%  100%  Weight:   70.9 kg   Height:         Physical Exam:  General: adult male in bed in NAD   HEENT: NCAT aside from bandage over broken nose Eyes: EOMI sclera with small hemorrhages (stable to improved) Neck: supple trachea midline Heart: S1S2 no rub Lungs: clear and unlabored  Abdomen: soft/nt/no distension; thin  Extremities: no edema appreciated; thin Skin: no rash on extremities  Psych normal mood and affect Neuro: alert and oriented to person and location (not to time - doesn't guess what year it is); provides hx and follows commands; slightly hard of hearing   Medications reviewed   Labs:     Latest Ref Rng & Units 07/03/2022    1:59 AM 07/02/2022    3:20 AM 07/01/2022    9:49 PM  BMP  Glucose 70 - 99 mg/dL 07/03/2022  299    BUN 8 - 23 mg/dL 26  38    Creatinine 371 - 1.24 mg/dL 6.96  7.89    Sodium 3.81 - 145 mmol/L 126  118  119   Potassium 3.5 - 5.1 mmol/L 3.6  3.9    Chloride 98 - 111 mmol/L 95  85    CO2 22 - 32 mmol/L 21  20    Calcium  8.9 - 10.3 mg/dL 8.7  8.5       Assessment/Plan:   # AKI  - Concerning for hepatorenal syndrome and he has been on treatment for the same. Most recently s/p normal saline x 12 hours. - Increased midodrine to 15 mg TID  - continue octreotide - continue albumin  - Strict ins/outs ordered - not available.  Reinforced importance of same  - Please avoid bactrim   # Hyponatremia  - Setting of peripheral overload/decompensated cirrhosis/presumed hepatorenal syndrome  - stop normal saline  - repeat BMP - may need D5W - not a candidate for tolvaptan  - Please hold spironolactone - Please hold bactrim    # Cirrhosis - decompensated and with ascites and encephalopathy  - paracentesis per GI discretion  - on lactulose and rifaximin    # HTN  - hx of such  - now hypotensive and on midodrine directed at presumed hepatorenal syndrome    # Macrocytic anemia  - iron low at 37 but 36% sat last check.  B12  and folate ok 05/2022   Disposition - continue inpatient monitoring  Estanislado Emms, MD 07/03/2022 11:53 AM

## 2022-07-03 NOTE — Consult Note (Signed)
[facility]                                                                                        Patient Identification: Patient Name: Andrew Patton MRN: [id number] Admit Date: 07/01/2022 11:19 AM Today's Date: 07/03/2022 Reason for consult: SBP prophylaxis Requesting provider: Dr. Darin Engels  Principal Problem:   Hepatorenal syndrome [facility]) Active Problems:   Chronic hyponatremia   Decompensated hepatic cirrhosis (HCC)   AKI (acute kidney injury) (HCC)   Antibiotics:  Ceftriaxone 12/1 Rifaximin 11/30-  Lines/Hardware:  Assessment 61 year old male with PMH as below including DM, hypothyroidism, psoriasis, decompensated hepatic cirrhosis secondary to alcohol abuse with portal hypertension and consideration for liver transplant and TIPS with [facility] transplant, history of SBP with complicated refractory ascites requiring weekly paracentesis and hepatic encephalopathy, chronic hyponatremia, seizure disorder who was sent from GI for worsening hyponatremia and AKI in the setting of Bactrim use for SBP prophylaxis. He presented to the ED on 11/30 via EMS with dizziness, weakness, confusion, nonbloody vomiting, abdominal pain/distension and weight gain  Sodium 118.  S/p paracentesis 12/1 WBC 47, PMN 5 ( was on bactrim for SBP ppx). CX pending   Recommendations  Continue ceftriaxone IP pending cultures  Ciprofloxacin/Bactrim are 2 commonly studied drugs for SBP prophylaxis which is not an option in his case. He is already on Rifaximin. There is retrospective study about doxycycline for SBP prophylaxis but no RCTs.  Dr Tomasa Rand discussed case with [facility] who recommended cefdinir as an alternative. Patient has been on IV ceftriaxone in the hospital without any issues and hence PO cefdinir should be OK for SBP prophylaxis.   Rest of the management as per the primary team. Please call  with questions or concerns.  Thank you for the consult  Odette Fraction, MD Infectious Disease Physician [facility] [address] [place], [place] [address] Phone: [phone]  Fax: [phone]  __________________________________________________________________________________________________________ HPI and Hospital Course: 61 year old male with PMH as below including DM, hypothyroidism, psoriasis, decompensated hepatic cirrhosis secondary to alcohol abuse with portal hypertension and consideration for liver transplant and TIPS with [facility] transplant, history of SBP with complicated refractory ascites requiring weekly paracentesis and hepatic encephalopathy, chronic hyponatremia, seizure disorder who was sent from GI for worsening hyponatremia and AKI in the setting of Bactrim use for SBP prophylaxis.  He presented to the ED on 11/30 via EMS with dizziness, weakness, confusion, nonbloody vomiting, abdominal pain/distension and weight gain  Sodium 118.  Denies any fevers, diarrhea.  Denies any chest pain, shortness of breath.  Recently admitted for SBP, AKI and hepatorenal syndrome, successfully treated and patient was discharged with prophylactic Bactrim for SBP prophylaxis  At ED labs remarkable for NA 120, BG 125, CL 88 bicarb 20.  BUN and creatinine elevated to 35 and 1.81 with concerns for AKI.  Deranged LFTs with AST 46 T. bili 2.3.  WBC 9 hemoglobin 10.7, ammonia elevated at 55.  Patient was given 5500 cc of sodium chloride due to hyponatremia  Spoke with patient as well as his wife, tells he mostly has abdominal  pain due to the umbilical hernia. They say they have a fu with [facility] transplant team on Dec 12. Patient tells he has rashes with penicillin long time back with no face/lip or tongue swelling.   ROS: poor historian, spoke with his wife, as above   Past Medical History:  Diagnosis Date   Allergy    Anxiety    Arthritis     Ascites    Diabetes (HCC)    Elevated transaminase level    Fatigue    Fatty liver    Hyperlipidemia    Hypertension    "under control", not on any blood pressure medication, has lost 60 lbs   Hypothyroidism    on synthroid   Inguinal muscle strain    Insomnia    Left knee pain    "long time ago"   Multinodular goiter    Obesity    Pneumonia    Psoriasis    Tinnitus    Past Surgical History:  Procedure Laterality Date   APPENDECTOMY  1976   FOOT SURGERY Bilateral 1975   IR PARACENTESIS  11/25/2021   IR PARACENTESIS  12/02/2021   IR PARACENTESIS  12/17/2021   IR PARACENTESIS  01/05/2022   IR PARACENTESIS  02/04/2022   IR PARACENTESIS  02/15/2022   IR PARACENTESIS  03/03/2022   IR PARACENTESIS  03/10/2022   IR PARACENTESIS  03/16/2022   IR PARACENTESIS  03/23/2022   IR PARACENTESIS  03/29/2022   IR PARACENTESIS  04/06/2022   IR PARACENTESIS  04/20/2022   IR PARACENTESIS  05/04/2022   IR PARACENTESIS  05/20/2022   IR PARACENTESIS  05/31/2022   IR PARACENTESIS  06/11/2022   IR PARACENTESIS  06/18/2022   IR PARACENTESIS  06/25/2022   IR PARACENTESIS  07/02/2022   IR RADIOLOGIST EVAL & MGMT  12/17/2021   IR RADIOLOGIST EVAL & MGMT  12/31/2021   NOSE SURGERY     ROTATOR CUFF REPAIR Left    left   SHOULDER SURGERY Right 12/20/2019   [facility]      Scheduled Meds:  busPIRone  15 mg Oral Q supper   busPIRone  7.5 mg Oral Daily   clonazePAM  0.5 mg Oral QHS   enoxaparin (LOVENOX) injection  40 mg Subcutaneous Daily   folic acid  1 mg Oral QHS   levETIRAcetam  1,000 mg Oral Daily   levothyroxine  88 mcg Oral Q0600   midodrine  15 mg Oral TID WC   pantoprazole  20 mg Oral Daily   rifaximin  550 mg Oral BID   thiamine  100 mg Oral Daily   Continuous Infusions:  sodium chloride 75 mL/hr at 07/03/22 0016   albumin human 25 g (07/03/22 0618)   cefTRIAXone (ROCEPHIN)  IV 2 g (07/02/22 1001)   octreotide (SANDOSTATIN) 500 mcg in sodium chloride 0.9 % 250 mL (2 mcg/mL)  infusion 50 mcg/hr (07/03/22 0533)   PRN Meds:.acetaminophen, lactulose, ondansetron (ZOFRAN) IV, ondansetron  Allergies  Allergen Reactions   Fentanyl Shortness Of Breath   Meloxicam Other (See Comments)    Leg pain   Rosuvastatin Other (See Comments)    Elevated liver enzymes   Wound Dressing Adhesive     Tape causing abrasions on his skin    Ciprofloxacin Rash   Penicillins Rash    Has patient had a PCN reaction causing immediate rash, facial/tongue/throat swelling, SOB or lightheadedness with hypotension: No Has patient had a PCN reaction causing severe rash involving mucus membranes or skin necrosis:  No Has patient had a PCN reaction that required hospitalization: No Has patient had a PCN reaction occurring within the last 10 years: No If all of the above answers are "NO", then may proceed with Cephalosporin use. Other reaction(s): rash   Social History   Socioeconomic History   Marital status: Married    Spouse name: Not on file   Number of children: 2   Years of education: Not on file   Highest education level: Associate degree: academic program  Occupational History   Occupation: sales  Tobacco Use   Smoking status: Former    Types: Cigarettes    Quit date: 03/27/1994    Years since quitting: 28.2   Smokeless tobacco: Former    Types: Chew   Tobacco comments:    did chew as a teenager  Vaping Use   Vaping Use: Never used  Substance and Sexual Activity   Alcohol use: Not Currently    Alcohol/week: 2.0 standard drinks of alcohol    Types: 2 Glasses of wine per week   Drug use: No   Sexual activity: Not on file  Other Topics Concern   Not on file  Social History Narrative   Lives at home with wife    Right handed   Caffeine: maybe 1-3 cups/week   Social Determinants of Health   Financial Resource Strain: Not on file  Food Insecurity: No Food Insecurity (06/01/2022)   Hunger Vital Sign    Worried About Running Out of Food in the Last Year: Never true     Ran Out of Food in the Last Year: Never true  Transportation Needs: No Transportation Needs (06/01/2022)   PRAPARE - Administrator, Civil Service (Medical): No    Lack of Transportation (Non-Medical): No  Physical Activity: Not on file  Stress: Not on file  Social Connections: Not on file  Intimate Partner Violence: Not At Risk (06/01/2022)   Humiliation, Afraid, Rape, and Kick questionnaire    Fear of Current or Ex-Partner: No    Emotionally Abused: No    Physically Abused: No    Sexually Abused: No   Family History  Problem Relation Age of Onset   Breast cancer Mother    Hyperlipidemia Mother    Other Mother        ? form of parkinson's    Parkinsonism Mother    Bladder Cancer Father    Parkinson's disease Father    Memory loss Maternal Grandmother        in her 90s   Birth defects Maternal Grandfather    Birth defects Paternal Grandmother    Colon cancer Neg Hx    Esophageal cancer Neg Hx    Rectal cancer Neg Hx    Stomach cancer Neg Hx    Colon polyps Neg Hx    Dementia Neg Hx    Alzheimer's disease Neg Hx    Vitals BP (!) 113/55 (BP Location: Right Arm)   Pulse 78   Temp 99 F (37.2 C) (Oral)   Resp 17   Ht 5\' 6"  (1.676 m)   Wt 70.9 kg   SpO2 100%   BMI 25.23 kg/m    Physical Exam Constitutional:  older than state age. Chronically ill looking.    Comments: Bilateral conjuctival hemorrhage + for more than a week ( wife says in the setting of vomiting)  Cardiovascular:     Rate and Rhythm: Normal rate and regular rhythm.     Heart sounds:  Pulmonary:     Effort: Pulmonary effort is normal on room air     Comments:   Abdominal:     Palpations: Abdomen is soft.     Tenderness: mild tenderness in the eipgastric and rt upper quadrant , umbilical hernia+.   Musculoskeletal:        General: No swelling or tenderness in peripheral joints  Skin:    Comments: bruised in the upper extremities   Neurological:     General: grossly non focal,  awake, alert and oriented  Psychiatric:        Mood and Affect: Mood normal.    Pertinent Microbiology Results for orders placed or performed during the hospital encounter of 07/01/22  Culture, body fluid w Gram Stain-bottle     Status: None (Preliminary result)   Collection Time: 07/02/22 12:11 PM   Specimen: Peritoneal Washings  Result Value Ref Range Status   Specimen Description PERITONEAL  Final   Special Requests NONE  Final   Culture   Final    NO GROWTH < 24 HOURS Performed at [facility], [address], [place], [place] [address]    Report Status PENDING  Incomplete  Gram stain     Status: None   Collection Time: 07/02/22 12:11 PM   Specimen: Peritoneal Washings  Result Value Ref Range Status   Specimen Description PERITONEAL  Final   Special Requests NONE  Final   Gram Stain   Final    NO ORGANISMS SEEN WBC PRESENT, PREDOMINANTLY MONONUCLEAR CYTOSPIN SMEAR Performed at [facility], [address], [place], [place] [address]    Report Status 07/02/2022 FINAL  Final    Pertinent Lab seen by me:    Latest Ref Rng & Units 07/01/2022   11:48 AM 06/18/2022   11:40 AM 06/01/2022    1:30 AM  CBC  WBC 4.0 - 10.5 K/uL 9.0  9.6  9.2   Hemoglobin 13.0 - 17.0 g/dL 80.1  9.0  8.0   Hematocrit 39.0 - 52.0 % RESULTS UNAVAILABLE DUE TO INTERFERING SUBSTANCE  24.7  22.0   Platelets 150 - 400 K/uL 286  290  230       Latest Ref Rng & Units 07/03/2022   12:03 PM 07/03/2022    1:59 AM 07/02/2022    3:20 AM  CMP  Glucose 70 - 99 mg/dL 655  374  827   BUN 8 - 23 mg/dL 26  26  38   Creatinine 0.61 - 1.24 mg/dL 0.78  6.75  4.49   Sodium 135 - 145 mmol/L 124  126  118   Potassium 3.5 - 5.1 mmol/L 3.6  3.6  3.9   Chloride 98 - 111 mmol/L 94  95  85   CO2 22 - 32 mmol/L 18  21  20    Calcium 8.9 - 10.3 mg/dL 8.2  8.7  8.5   Total Protein 6.5 - 8.1 g/dL   5.4   Total Bilirubin 0.3 - 1.2 mg/dL   2.5   Alkaline Phos 38 - 126 U/L   71   AST 15 - 41 U/L   33   ALT  0 - 44 U/L   22      Pertinent Imagings/Other Imagings Plain films and CT images have been personally visualized and interpreted; radiology reports have been reviewed. Decision making incorporated into the Impression / Recommendations.  IR Paracentesis  Result Date: 07/02/2022 INDICATION: Patient with history of alcoholic cirrhosis with recurrent ascites.  Request is for therapeutic and diagnostic paracentesis EXAM: ULTRASOUND GUIDED THERAPEUTIC AND DIAGNOSTIC PARACENTESIS MEDICATIONS: Lidocaine 1% 10 mL COMPLICATIONS: None immediate. PROCEDURE: Informed written consent was obtained from the patient after a discussion of the risks, benefits and alternatives to treatment. A timeout was performed prior to the initiation of the procedure. Initial ultrasound scanning demonstrates a large amount of ascites within the left lower abdominal quadrant. The left lower abdomen was prepped and draped in the usual sterile fashion. 1% lidocaine was used for local anesthesia. Following this, a 19 gauge, 7-cm, Yueh catheter was introduced. An ultrasound image was saved for documentation purposes. The paracentesis was performed. The catheter was removed and a dressing was applied. The patient tolerated the procedure well without immediate post procedural complication. FINDINGS: A total of approximately 3 L of cloudy yellow fluid was removed. Samples were sent to the laboratory as requested by the clinical team. IMPRESSION: Successful ultrasound-guided therapeutic and diagnostic paracentesis yielding 3 L liters of peritoneal fluid. Read by: Anders Grant, NP PLAN: The patient has previously been formally evaluated by the [facility] and is being actively followed for potential future intervention. Electronically Signed   By: Marliss Coots M.D.   On: 07/02/2022 15:33   IR Paracentesis  Result Date: 06/25/2022 INDICATION: Alcoholic cirrhosis with recurrent ascites  EXAM: ULTRASOUND GUIDED PARACENTESIS MEDICATIONS: 10 cc 1% lidocaine COMPLICATIONS: None immediate. PROCEDURE: Informed written consent was obtained from the patient after a discussion of the risks, benefits and alternatives to treatment. A timeout was performed prior to the initiation of the procedure. Initial ultrasound scanning demonstrates a large amount of ascites within the right lower abdominal quadrant. The right lower abdomen was prepped and draped in the usual sterile fashion. 1% lidocaine was used for local anesthesia. Following this, a 19 gauge, 7-cm, Yueh catheter was introduced. An ultrasound image was saved for documentation purposes. The paracentesis was performed. The patient accidentally self-removed catheter after approximately 1 L fluid had been removed. Following this, ultrasound scanning demonstrated a safe window in the left lower abdominal quadrant. The left lower abdomen was prepped and draped in the usual sterile fashion. 1% lidocaine was used for local anesthesia. A 19 gauge 7 cm Yueh catheter was introduced. The paracentesis was performed. The catheter was removed and a dressing was applied. The patient tolerated the procedure well without immediate post procedural complication. FINDINGS: A total of approximately 5 L of clear yellow fluid was removed. Ordering provider did not request laboratory samples IMPRESSION: Successful ultrasound-guided paracentesis yielding 5 liters of peritoneal fluid. PLAN: The patient has previously been formally evaluated by the [facility] and is being actively followed for potential future intervention. Read by: Mina Marble, PA-C Electronically Signed   By: Richarda Overlie M.D.   On: 06/25/2022 18:56   DG Shoulder Left  Result Date: 06/18/2022 CLINICAL DATA:  Fall.  Left shoulder pain. EXAM: LEFT SHOULDER - 2+ VIEW COMPARISON:  None Available. FINDINGS: No acute fracture or dislocation is identified. No  significant arthropathy is evident. The soft tissues are unremarkable. IMPRESSION: Negative. Electronically Signed   By: Sebastian Ache M.D.   On: 06/18/2022 14:45   CT HEAD WO CONTRAST ( )  Result Date: 06/18/2022 CLINICAL DATA:  Head trauma, fall, fell twice yesterday uncertain if struck head EXAM: CT HEAD WITHOUT CONTRAST CT CERVICAL SPINE WITHOUT CONTRAST TECHNIQUE: Multidetector CT imaging of the head and cervical spine was performed following the standard protocol without intravenous contrast. Multiplanar CT image  reconstructions of the cervical spine were also generated. RADIATION DOSE REDUCTION: This exam was performed according to the departmental dose-optimization program which includes automated exposure control, adjustment of the mA and/or kV according to patient size and/or use of iterative reconstruction technique. COMPARISON:  CT head 02/08/2020 FINDINGS: CT HEAD FINDINGS Brain: Mild generalized atrophy. Normal ventricular morphology. No midline shift or mass effect. 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 No intracranial hemorrhage or evidence of acute infarction. No extra-axial fluid collections. Calcified mass RIGHT vertex question calcified meningioma versus dural calcification 8 mm diameter unchanged. Vascular: No hyperdense vessels. Skull: Calvaria intact. Sclerotic focus within clivus unchanged since 10/08/2008. Sinuses/Orbits: Clear Other: N/A CT CERVICAL SPINE FINDINGS Alignment: Normal Skull base and vertebrae: Osseous demineralization. Skull base intact. Vertebral body heights maintained. No fracture, subluxation, or bone destruction. Disc space narrowing and endplate spur formation C5-C6 and C6-C7. Scattered multilevel facet degenerative changes. Soft tissues and spinal canal: Prevertebral soft tissues normal thickness. 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 No acute  intracranial abnormalities. Stable 8 mm dural calcification versus meningioma RIGHT vertex. Degenerative disc and facet disease changes of the cervical spine. No acute cervical spine abnormalities. Electronically Signed   By: Ulyses Southward M.D.   On: 06/18/2022 12:16   CT CERVICAL SPINE WO CONTRAST  Result Date: 06/18/2022 CLINICAL DATA:  Head trauma, fall, fell twice yesterday uncertain if struck head EXAM: CT HEAD WITHOUT CONTRAST CT CERVICAL SPINE WITHOUT CONTRAST TECHNIQUE: Multidetector CT imaging of the head and cervical spine was performed following the standard protocol without intravenous contrast. Multiplanar CT image reconstructions of the cervical spine were also generated. RADIATION DOSE REDUCTION: This exam was performed according to the departmental dose-optimization program which includes automated exposure control, adjustment of the mA and/or kV according to patient size and/or use of iterative reconstruction technique. COMPARISON:  CT head 02/08/2020 FINDINGS: CT HEAD FINDINGS Brain: Mild generalized atrophy. Normal ventricular morphology. No midline shift or mass effect. 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 No intracranial hemorrhage or evidence of acute infarction. No extra-axial fluid collections. Calcified mass RIGHT vertex question calcified meningioma versus dural calcification 8 mm diameter unchanged. Vascular: No hyperdense vessels. Skull: Calvaria intact. Sclerotic focus within clivus unchanged since 10/08/2008. Sinuses/Orbits: Clear Other: N/A CT CERVICAL SPINE FINDINGS Alignment: Normal Skull base and vertebrae: Osseous demineralization. Skull base intact. Vertebral body heights maintained. No fracture, subluxation, or bone destruction. Disc space narrowing and endplate spur formation C5-C6 and C6-C7. Scattered multilevel facet degenerative changes. Soft tissues and spinal canal: Prevertebral soft tissues normal thickness. 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 No acute intracranial abnormalities. Stable 8 mm dural calcification versus meningioma RIGHT vertex. Degenerative disc and facet disease changes of the cervical spine. No acute cervical spine abnormalities. Electronically Signed   By: Ulyses Southward M.D.   On: 06/18/2022 12:16   DG Pelvis 1-2 Views  Result Date: 06/18/2022 CLINICAL DATA:  [name] down today, weakness EXAM: PELVIS - 1-2 VIEW COMPARISON:  CT abdomen and pelvis 05/26/2022, 03/07/2019 FINDINGS: Osseous mineralization normal. Hip joint space narrowing greater on LEFT. SI joints preserved. Sclerotic focus at RIGHT femoral neck unchanged since 03/07/2019, question enchondroma. No fracture, dislocation, or bone destruction. IMPRESSION: No acute osseous abnormalities. Electronically Signed   By: Ulyses Southward M.D.   On: 06/18/2022 12:06   DG Chest 1 View  Result Date: 06/18/2022 CLINICAL DATA:  [name] today, history of multiple falls, seizures, weakness EXAM: CHEST  1 VIEW COMPARISON:  08/05/2021 FINDINGS: Upper normal heart size. Mediastinal contours and pulmonary vascularity normal. Atherosclerotic calcification aorta. Questionable nodular density versus nipple shadow RIGHT lung base. Lungs otherwise clear. No pulmonary infiltrate, pleural effusion, or pneumothorax. No acute osseous findings. IMPRESSION: Question nodular density versus nipple shadow at RIGHT lung base; repeat PA chest radiograph with nipple markers recommended to exclude pulmonary nodule. Aortic Atherosclerosis (ICD10-I70.0). Electronically Signed   By: Ulyses Southward M.D.   On: 06/18/2022 12:05   DG Elbow Complete Left  Result Date: 06/18/2022 CLINICAL DATA:  [name] today, history of multiple falls, seizures, laceration posterior LEFT elbow, weakness EXAM: LEFT ELBOW - COMPLETE 3+ VIEW COMPARISON:  None Available. FINDINGS: Osseous mineralization normal. Joint spaces preserved. No acute  fracture, dislocation, or bone destruction. No joint effusion. Soft tissue swelling dorsal to the elbow/olecranon with foci of soft tissue gas consistent with history of laceration. No radiopaque foreign body. IMPRESSION: No acute osseous abnormalities. Electronically Signed   By: Ulyses Southward M.D.   On: 06/18/2022 12:03   DG Knee 2 Views Left  Result Date: 06/18/2022 CLINICAL DATA:  [name] today, history of multiple falls, seizures, laceration anterior LEFT knee, weakness EXAM: LEFT KNEE - 1-2 VIEW COMPARISON:  04/29/2014 FINDINGS: Osseous mineralization normal. Joint spaces preserved. No acute fracture, dislocation, or bone destruction. No significant joint effusion or radiopaque foreign bodies. IMPRESSION: No acute abnormalities. Electronically Signed   By: Ulyses Southward M.D.   On: 06/18/2022 12:02   IR Paracentesis  Result Date: 06/18/2022 INDICATION: Alcoholic cirrhosis with recurrent ascites EXAM: ULTRASOUND GUIDED RIGHT PARACENTESIS MEDICATIONS: None. COMPLICATIONS: None immediate. PROCEDURE: Informed written consent was obtained from the patient after a discussion of the risks, benefits and alternatives to treatment. A timeout was performed prior to the initiation of the procedure. Initial ultrasound scanning demonstrates a large amount of ascites within the right lower abdominal quadrant. The right lower abdomen was prepped and draped in the usual sterile fashion. 1% lidocaine was used for local anesthesia. Following this, a 19 gauge, 7-cm, Yueh catheter was introduced. An ultrasound image was saved for documentation purposes. The paracentesis was performed. The catheter was removed and a dressing was applied. The patient tolerated the procedure well without immediate post procedural complication. FINDINGS: A total of approximately 4L of minimally cloudy ascitic fluid was removed. Samples were sent to the laboratory. IMPRESSION: Successful ultrasound-guided paracentesis yielding 4L liters of peritoneal  fluid. PLAN: The patient has previously been formally evaluated by the [facility]pert[facility] and is being actively followed for potential future intervention. Electronically Signed   By: Malachy Moan M.D.   On: 06/18/2022 10:52   IR Paracentesis  Result Date: 06/11/2022 INDICATION: Patient with history of alcoholic cirrhosis with recurrent ascites. Request is for therapeutic paracentesis with 6 L maximum EXAM: ULTRASOUND GUIDED THERAPEUTIC PARACENTESIS MEDICATIONS: Lidocaine 1% 10 mL COMPLICATIONS: None immediate. PROCEDURE: Informed written consent was obtained from the patient after a discussion of the risks, benefits and alternatives to treatment. A timeout was performed prior to the initiation of the procedure. Initial ultrasound scanning demonstrates a large amount of ascites within the right lower abdominal quadrant. The right lower abdomen was prepped and draped in the usual sterile fashion. 1% lidocaine was used for local anesthesia. Following this, a 19 gauge, 10-cm, Yueh catheter was introduced. An ultrasound image was saved for documentation purposes. The paracentesis was performed. The catheter  was removed and a dressing was applied. The patient tolerated the procedure well without immediate post procedural complication. FINDINGS: A total of approximately 6 liters of straw-colored fluid was removed. IMPRESSION: Successful ultrasound-guided therapeutic paracentesis yielding 6 liters of peritoneal fluid. PLAN: The patient [facility] and is being actively followed for potential future intervention. The [facility] and [facility] Electronically Signed   By: Marliss Cootsylan  Suttle M.D.   On: 06/11/2022 12:19     I spent 80 minutes for this patient encounter including review of prior medical  records/discussing diagnostics and treatment plan with the patient/family/coordinate care with primary/other specialits with greater than 50% of time in face to face encounter.   Electronically signed by:   Odette FractionSabina Shaterria Sager, MD Infectious Disease Physician [facility] Pager: [phone]

## 2022-07-03 NOTE — Progress Notes (Signed)
[facility] PROGRESS NOTE    Progress Note  JAYLEEN JOAS  [id number] DOB: 05-06-1961 DOA: 07/01/2022 PCP: Deland Pretty, MD     Brief Narrative:   PEARSE KLEIBER is an 61 y.o. male past medical history of recurrent decompensated hepatic cirrhosis secondary to alcohol abuse, SBP, recurrent ascites with weekly paracentesis, chronic hyponatremia seizure disorder sent from [facility] for worsening hyponatremia and acute kidney injury patient has been recently treated in the hospital for SBP and acute kidney injury discharged home on prophylactic antibiotics started having abdominal distention and lower extremity edema has gained 3 pounds in the last few days he is also been having abdominal pain and unsteady gait.  Labs in the ED showed acute kidney injury of 1.8 (previous lab 1.2) sodium 120 potassium 3.9 bicarb 20.  When he was discharged from the hospital he weighed around 68 kg on admission 72.3 kg.   Assessment/Plan:   Acute on chronic hypervolemic hyponatremia: There was a concern for hepatorenal syndrome. Started on midodrine Sandostatin and albumin. GI has been consulted.  His sodium is slowly improving.  Acute kidney injury/hepatorenal syndrome: Suspect hepatorenal syndrome. Hold Bactrim, Lasix and Aldactone. Currently on albumin midodrine and octreotide his creatinine is slowly improving. Renal was consulted recommended start gentle IV fluid hydration. Appreciate assistance  Abd pain/Decompensated hepatic cirrhosis: With recurrent ascites, his weight is increasing. IR has been consulted for paracentesis removed 3 L of fluid currently on IV Rocephin. INR 1.5 . He relates he has an allergic reaction to penicillin that is less than 5% cross-reactivity he is tolerating Rocephin we can transition him to oral cefdinir  Seizure disorder: Continue Keppra.  Anxiety/depression: Continue BuSpar and Klonopin.   DVT prophylaxis: lovenox Family  Communication:Wife Status is: Inpatient Remains inpatient appropriate because: Hyponatremia abdominal pain    Code Status:     Code Status Orders  (From admission, onward)           Start     Ordered   07/01/22 1704  Full code  Continuous        07/01/22 1705           Code Status History     Date Active Date Inactive Code Status Order ID Comments User Context   05/27/2022 1927 06/02/2022 0149 Full Code [id number]  Lenore Cordia, MD ED   02/19/2022 2122 02/22/2022 1907 Full Code [id number]  Etta Quill, DO Inpatient   07/02/2021 2128 07/06/2021 1842 Full Code [id number]  Orene Desanctis, DO ED   03/07/2019 2356 03/09/2019 2048 Full Code [id number]  Jani Gravel, MD ED      Advance Directive Documentation    Flowsheet Row Most Recent Value  Type of Advance Directive Healthcare Power of Attorney  Pre-existing out of facility DNR order (yellow form or pink MOST form) --  "MOST" Form in Place? --         IV Access:   Peripheral IV   Procedures and diagnostic studies:   IR Paracentesis  Result Date: 07/02/2022 INDICATION: Patient with history of alcoholic cirrhosis with recurrent ascites. Request is for therapeutic and diagnostic paracentesis EXAM: ULTRASOUND GUIDED THERAPEUTIC AND DIAGNOSTIC PARACENTESIS MEDICATIONS: Lidocaine 1% 10 mL COMPLICATIONS: None immediate. PROCEDURE: Informed written consent was obtained from the patient after a discussion of the risks, benefits and alternatives to treatment. A timeout was performed prior to the initiation of the procedure. Initial ultrasound scanning demonstrates a large amount of ascites within the left lower abdominal quadrant. The left lower abdomen  was prepped and draped in the usual sterile fashion. 1% lidocaine was used for local anesthesia. Following this, a 19 gauge, 7-cm, Yueh catheter was introduced. An ultrasound image was saved for documentation purposes. The paracentesis was performed. The catheter was removed and a  dressing was applied. The patient tolerated the procedure well without immediate post procedural complication. FINDINGS: A total of approximately 3 L of cloudy yellow fluid was removed. Samples were sent to the laboratory as requested by the clinical team. IMPRESSION: Successful ultrasound-guided therapeutic and diagnostic paracentesis yielding 3 L liters of peritoneal fluid. Read by: Rushie Nyhan, NP PLAN: The patient has previously been formally evaluated by the [facility] and is being actively followed for potential future intervention. Electronically Signed   By: Ruthann Cancer M.D.   On: 07/02/2022 15:33     Medical Consultants:   None.   Subjective:    Thressa Sheller abdominal pain improved feels better.  Objective:    Vitals:   07/02/22 2024 07/03/22 0610 07/03/22 0611 07/03/22 0803  BP: (!) 108/48 (!) 112/56  (!) 113/55  Pulse: 89 81  78  Resp: 18 18  17   Temp: 98.5 F (36.9 C) 98.7 F (37.1 C)  99 F (37.2 C)  TempSrc: Oral Oral  Oral  SpO2: 100% 99%  100%  Weight:   70.9 kg   Height:       SpO2: 100 %   Intake/Output Summary (Last 24 hours) at 07/03/2022 0853 Last data filed at 07/03/2022 0418 Gross per 24 hour  Intake 2437.45 ml  Output --  Net 2437.45 ml   Filed Weights   07/01/22 1125 07/02/22 0500 07/03/22 0611  Weight: 69.9 kg 72.3 kg 70.9 kg    Exam: General exam: In no acute distress. Respiratory system: Good air movement and clear to auscultation. Cardiovascular system: S1 & S2 heard, RRR. No JVD. Gastrointestinal system: Abdomen is nondistended, soft and nontender.  Extremities: No pedal edema. Skin: No rashes, lesions or ulcers Psychiatry: Judgement and insight appear normal. Mood & affect appropriate.  Data Reviewed:    Labs: Basic Metabolic Panel: Recent Labs  Lab 06/30/22 1400 07/01/22 1148 07/01/22 2149 07/02/22 0320 07/03/22 0159  NA 118* 120* 119* 118* 126*  K 3.6 3.9   --  3.9 3.6  CL 85* 88*  --  85* 95*  CO2 22 20*  --  20* 21*  GLUCOSE 123* 125*  --  158* 122*  BUN 39* 35*  --  38* 26*  CREATININE 1.73* 1.81*  --  2.01* 1.54*  CALCIUM 8.7 8.6*  --  8.5* 8.7*  PHOS  --   --   --   --  3.2    GFR Estimated Creatinine Clearance: 45.5 mL/min (A) (by C-G formula based on SCr of 1.54 mg/dL (H)). Liver Function Tests: Recent Labs  Lab 07/01/22 1148 07/02/22 0320 07/03/22 0159  AST 46* 33  --   ALT 25 22  --   ALKPHOS 92 71  --   BILITOT 2.3* 2.5*  --   PROT 6.3* 5.4*  --   ALBUMIN 3.6 3.3* 3.6    No results for input(s): "LIPASE", "AMYLASE" in the last 168 hours. Recent Labs  Lab 07/01/22 1400  AMMONIA 55*    Coagulation profile Recent Labs  Lab 07/01/22 2149 07/02/22 0320  INR 1.4* 1.5*    COVID-19 Labs  No results for input(s): "DDIMER", "FERRITIN", "LDH", "CRP" in the last 72 hours.  Lab Results  Component Value Date   SARSCOV2NAA NEGATIVE 07/02/2021   SARSCOV2NAA NEGATIVE 03/07/2019    CBC: Recent Labs  Lab 07/01/22 1148  WBC 9.0  NEUTROABS 6.3  HGB 10.7*  HCT RESULTS UNAVAILABLE DUE TO INTERFERING SUBSTANCE  MCV RESULTS UNAVAILABLE DUE TO INTERFERING SUBSTANCE  PLT 286    Cardiac Enzymes: No results for input(s): "CKTOTAL", "CKMB", "CKMBINDEX", "TROPONINI" in the last 168 hours. BNP (last 3 results) No results for input(s): "PROBNP" in the last 8760 hours. CBG: No results for input(s): "GLUCAP" in the last 168 hours. D-Dimer: No results for input(s): "DDIMER" in the last 72 hours. Hgb A1c: No results for input(s): "HGBA1C" in the last 72 hours. Lipid Profile: No results for input(s): "CHOL", "HDL", "LDLCALC", "TRIG", "CHOLHDL", "LDLDIRECT" in the last 72 hours. Thyroid function studies: Recent Labs    07/01/22 11/30/2147  TSH 1.477    Anemia work up: No results for input(s): "VITAMINB12", "FOLATE", "FERRITIN", "TIBC", "IRON", "RETICCTPCT" in the last 72 hours. Sepsis Labs: Recent Labs  Lab  07/01/22 1148  WBC 9.0    Microbiology Recent Results (from the past 240 hour(s))  Culture, body fluid w Gram Stain-bottle     Status: None (Preliminary result)   Collection Time: 07/02/22 12:11 PM   Specimen: Peritoneal Washings  Result Value Ref Range Status   Specimen Description PERITONEAL  Final   Special Requests NONE  Final   Culture   Final    NO GROWTH < 24 HOURS Performed at [facility], [address], [place], [place] [address]    Report Status PENDING  Incomplete  Gram stain     Status: None   Collection Time: 07/02/22 12:11 PM   Specimen: Peritoneal Washings  Result Value Ref Range Status   Specimen Description PERITONEAL  Final   Special Requests NONE  Final   Gram Stain   Final    NO ORGANISMS SEEN WBC PRESENT, PREDOMINANTLY MONONUCLEAR CYTOSPIN SMEAR Performed at [facility], [address], [place], [place] [address]    Report Status 07/02/2022 FINAL  Final     Medications:    busPIRone  15 mg Oral Q supper   busPIRone  7.5 mg Oral Daily   clonazePAM  0.5 mg Oral QHS   enoxaparin (LOVENOX) injection  40 mg Subcutaneous Daily   folic acid  1 mg Oral QHS   levETIRAcetam  1,000 mg Oral Daily   levothyroxine  88 mcg Oral Q0600   midodrine  15 mg Oral TID WC   pantoprazole  20 mg Oral Daily   rifaximin  550 mg Oral BID   thiamine  100 mg Oral Daily   Continuous Infusions:  sodium chloride 75 mL/hr at 07/03/22 0016   albumin human 25 g (07/03/22 0618)   cefTRIAXone (ROCEPHIN)  IV 2 g (07/02/22 1001)   octreotide (SANDOSTATIN) 500 mcg in sodium chloride 0.9 % 250 mL (2 mcg/mL) infusion 50 mcg/hr (07/03/22 0533)      LOS: 2 days   Marinda Elk  [facility]  07/03/2022, 8:53 AM

## 2022-07-03 NOTE — Progress Notes (Signed)
Progress Note for [facility]  Subjective: Feeling better.  No new complaints.  Objective: Vital signs in last 24 hours: Temp:  [98.5 F (36.9 C)-99 F (37.2 C)] 99 F (37.2 C) (12/02 0803) Pulse Rate:  [74-89] 78 (12/02 0803) Resp:  [17-18] 17 (12/02 0803) BP: (98-113)/(43-56) 113/55 (12/02 0803) SpO2:  [99 %-100 %] 100 % (12/02 0803) Weight:  [70.9 kg] 70.9 kg (12/02 0611) Last BM Date : 06/30/22  Intake/Output from previous day: 12/01 0701 - 12/02 0700 In: 2437.5 [P.O.:940; I.V.:1009.8; IV Piggyback:487.6] Out: -  Intake/Output this shift: No intake/output data recorded.  General appearance: alert and no distress Resp: clear to auscultation bilaterally Cardio: regular rate and rhythm GI: Mild ascites, umbilical hernia, some tenderness with palpation around the hernia Extremities: extremities normal, atraumatic, no cyanosis or edema  Lab Results: Recent Labs    07/01/22 1148  WBC 9.0  HGB 10.7*  HCT RESULTS UNAVAILABLE DUE TO INTERFERING SUBSTANCE  PLT 286   BMET Recent Labs    07/01/22 1148 07/01/22 2149 07/02/22 0320 07/03/22 0159  NA 120* 119* 118* 126*  K 3.9  --  3.9 3.6  CL 88*  --  85* 95*  CO2 20*  --  20* 21*  GLUCOSE 125*  --  158* 122*  BUN 35*  --  38* 26*  CREATININE 1.81*  --  2.01* 1.54*  CALCIUM 8.6*  --  8.5* 8.7*   LFT Recent Labs    07/02/22 0320 07/03/22 0159  PROT 5.4*  --   ALBUMIN 3.3* 3.6  AST 33  --   ALT 22  --   ALKPHOS 71  --   BILITOT 2.5*  --    PT/INR Recent Labs    07/01/22 2149 07/02/22 0320  LABPROT 17.4* 17.8*  INR 1.4* 1.5*   Hepatitis Panel No results for input(s): "HEPBSAG", "HCVAB", "HEPAIGM", "HEPBIGM" in the last 72 hours. C-Diff No results for input(s): "CDIFFTOX" in the last 72 hours. Fecal Lactopherrin No results for input(s): "FECLLACTOFRN" in the last 72 hours.  Studies/Results: IR Paracentesis  Result Date: 07/02/2022 INDICATION: Patient with history of alcoholic cirrhosis with  recurrent ascites. Request is for therapeutic and diagnostic paracentesis EXAM: ULTRASOUND GUIDED THERAPEUTIC AND DIAGNOSTIC PARACENTESIS MEDICATIONS: Lidocaine 1% 10 mL COMPLICATIONS: None immediate. PROCEDURE: Informed written consent was obtained from the patient after a discussion of the risks, benefits and alternatives to treatment. A timeout was performed prior to the initiation of the procedure. Initial ultrasound scanning demonstrates a large amount of ascites within the left lower abdominal quadrant. The left lower abdomen was prepped and draped in the usual sterile fashion. 1% lidocaine was used for local anesthesia. Following this, a 19 gauge, 7-cm, Yueh catheter was introduced. An ultrasound image was saved for documentation purposes. The paracentesis was performed. The catheter was removed and a dressing was applied. The patient tolerated the procedure well without immediate post procedural complication. FINDINGS: A total of approximately 3 L of cloudy yellow fluid was removed. Samples were sent to the laboratory as requested by the clinical team. IMPRESSION: Successful ultrasound-guided therapeutic and diagnostic paracentesis yielding 3 L liters of peritoneal fluid. Read by: Anders Grant, NP PLAN: The patient has previously been formally evaluated by the [facility] and is being actively followed for potential future intervention. Electronically Signed   By: Marliss Coots M.D.   On: 07/02/2022 15:33    Medications: Scheduled:  busPIRone  15 mg Oral Q supper   busPIRone  7.5  mg Oral Daily   clonazePAM  0.5 mg Oral QHS   enoxaparin (LOVENOX) injection  40 mg Subcutaneous Daily   folic acid  1 mg Oral QHS   levETIRAcetam  1,000 mg Oral Daily   levothyroxine  88 mcg Oral Q0600   midodrine  15 mg Oral TID WC   pantoprazole  20 mg Oral Daily   rifaximin  550 mg Oral BID   thiamine  100 mg Oral Daily   Continuous:  sodium chloride 75  mL/hr at 07/03/22 0016   albumin human 25 g (07/03/22 0618)   cefTRIAXone (ROCEPHIN)  IV 2 g (07/03/22 0943)   octreotide (SANDOSTATIN) 500 mcg in sodium chloride 0.9 % 250 mL (2 mcg/mL) infusion 50 mcg/hr (07/03/22 0533)    Assessment/Plan: 1) ETOH cirrhosis.  MELD 3.0 - 29 (07/02/2022) 2) AKI - improved. 3) Hyponatremia - improved. 4) Umbilical hernia - no evidence of incarceration.   His electrolytes and creatinine have improved and he is feeling better.  Renal input is much appreciated.    Plan: 1) Continue with midodrine, albumin, and octreotide. 2) Continue with CTX and rifaximin.  LOS: 2 days   Loreley Schwall D 07/03/2022, 10:55 AM

## 2022-07-03 NOTE — Plan of Care (Signed)

## 2022-07-04 DIAGNOSIS — K767 Hepatorenal syndrome: Secondary | ICD-10-CM | POA: Diagnosis not present

## 2022-07-04 DIAGNOSIS — T368X5A Adverse effect of other systemic antibiotics, initial encounter: Secondary | ICD-10-CM

## 2022-07-04 DIAGNOSIS — Z880 Allergy status to penicillin: Secondary | ICD-10-CM

## 2022-07-04 LAB — RENAL FUNCTION PANEL
Albumin: 3.6 g/dL (ref 3.5–5.0)
Anion gap: 6 (ref 5–15)
BUN: 25 mg/dL — ABNORMAL HIGH (ref 8–23)
CO2: 21 mmol/L — ABNORMAL LOW (ref 22–32)
Calcium: 8 mg/dL — ABNORMAL LOW (ref 8.9–10.3)
Chloride: 99 mmol/L (ref 98–111)
Creatinine, Ser: 1.35 mg/dL — ABNORMAL HIGH (ref 0.61–1.24)
GFR, Estimated: 60 mL/min — ABNORMAL LOW (ref >60)
Glucose, Bld: 114 mg/dL — ABNORMAL HIGH (ref 70–99)
Phosphorus: 3 mg/dL (ref 2.5–4.6)
Potassium: 3.4 mmol/L — ABNORMAL LOW (ref 3.5–5.1)
Sodium: 126 mmol/L — ABNORMAL LOW (ref 135–145)

## 2022-07-04 NOTE — Progress Notes (Addendum)
[facility] PROGRESS NOTE    Progress Note  Andrew Patton  XNT:[id number] DOB: 1960/11/01 DOA: 07/01/2022 PCP: [name], MD     Brief Narrative:   Andrew Patton is an 61 y.o. male past medical history of recurrent decompensated hepatic cirrhosis secondary to alcohol abuse, SBP, recurrent ascites with weekly paracentesis, chronic hyponatremia seizure disorder sent from [facility] for worsening hyponatremia and acute kidney injury patient has been recently treated in the hospital for SBP and acute kidney injury discharged home on prophylactic antibiotics started having abdominal distention and lower extremity edema has gained 3 pounds in the last few days he is also been having abdominal pain and unsteady gait.  Labs in the ED showed acute kidney injury of 1.8 (previous lab 1.2) sodium 120 potassium 3.9 bicarb 20.  When he was discharged from the hospital he weighed around 68 kg on admission 72.3 kg.   Assessment/Plan:   Acute on chronic hypervolemic hyponatremia: There was a concern for hepatorenal syndrome. Started on midodrine Sandostatin and albumin. Renal has been consulted sodium is stabilized. IV fluids were stopped yesterday, but sodium failed to improve this morning.  Acute kidney injury/hepatorenal syndrome: Suspect hepatorenal syndrome. Hold Bactrim, Lasix and Aldactone. Currently on albumin midodrine and octreotide his creatinine is slowly improving. Received renal assistance further management per nephrology.  Abd pain/Decompensated hepatic cirrhosis: With recurrent ascites, his weight is increasing. IR has been consulted for paracentesis removed 3 L of fluid currently on IV Rocephin. INR 1.5 . He relates he has an allergic reaction to penicillin that is less than 5% cross-reactivity he is tolerating Rocephin we can transition him to oral cefdinir  Seizure disorder: Continue Keppra.  Anxiety/depression: Continue BuSpar and Klonopin.   DVT  prophylaxis: lovenox Family Communication:Wife Status is: Inpatient Remains inpatient appropriate because: Hyponatremia abdominal pain    Code Status:     Code Status Orders  (From admission, onward)           Start     Ordered   07/01/22 1704  Full code  Continuous        07/01/22 1705           Code Status History     Date Active Date Inactive Code Status Order ID Comments User Context   05/27/2022 1927 06/02/2022 0149 Full Code [id number]  Charlsie Quest, MD ED   02/19/2022 2122 02/22/2022 1907 Full Code [id number]  Hillary Bow, DO Inpatient   07/02/2021 2128 07/06/2021 1842 Full Code [id number]  Anselm Jungling, DO ED   03/07/2019 2356 03/09/2019 2048 Full Code [id number]  Pearson Grippe, MD ED      Advance Directive Documentation    Flowsheet Row Most Recent Value  Type of Advance Directive Healthcare Power of Attorney  Pre-existing out of facility DNR order (yellow form or pink MOST form) --  "MOST" Form in Place? --         IV Access:   Peripheral IV   Procedures and diagnostic studies:   IR Paracentesis  Result Date: 07/02/2022 INDICATION: Patient with history of alcoholic cirrhosis with recurrent ascites. Request is for therapeutic and diagnostic paracentesis EXAM: ULTRASOUND GUIDED THERAPEUTIC AND DIAGNOSTIC PARACENTESIS MEDICATIONS: Lidocaine 1% 10 mL COMPLICATIONS: None immediate. PROCEDURE: Informed written consent was obtained from the patient after a discussion of the risks, benefits and alternatives to treatment. A timeout was performed prior to the initiation of the procedure. Initial ultrasound scanning demonstrates a large amount of ascites within the left lower abdominal  quadrant. The left lower abdomen was prepped and draped in the usual sterile fashion. 1% lidocaine was used for local anesthesia. Following this, a 19 gauge, 7-cm, Yueh catheter was introduced. An ultrasound image was saved for documentation purposes. The paracentesis was performed. The  catheter was removed and a dressing was applied. The patient tolerated the procedure well without immediate post procedural complication. FINDINGS: A total of approximately 3 L of cloudy yellow fluid was removed. Samples were sent to the laboratory as requested by the clinical team. IMPRESSION: Successful ultrasound-guided therapeutic and diagnostic paracentesis yielding 3 L liters of peritoneal fluid. Read by: Anders Grant, NP PLAN: The patient has previously been formally evaluated by the [facility] and is being actively followed for potential future intervention. Electronically Signed   By: Marliss Coots M.D.   On: 07/02/2022 15:33     Medical Consultants:   None.   Subjective:    Andrew Patton abdominal pain about the same.  Objective:    Vitals:   07/04/22 0400 07/04/22 0431 07/04/22 0447 07/04/22 0756  BP:  (!) 101/43  (!) 104/53  Pulse:  60  (!) 59  Resp: 17 17  17   Temp:  97.8 F (36.6 C)  97.6 F (36.4 C)  TempSrc:  Oral    SpO2:  99%  100%  Weight:   71.3 kg   Height:       SpO2: 100 %   Intake/Output Summary (Last 24 hours) at 07/04/2022 0823 Last data filed at 07/04/2022 0600 Gross per 24 hour  Intake 1267.66 ml  Output 500 ml  Net 767.66 ml    Filed Weights   07/02/22 0500 07/03/22 0611 07/04/22 0447  Weight: 72.3 kg 70.9 kg 71.3 kg    Exam: General exam: In no acute distress. Respiratory system: Good air movement and clear to auscultation. Cardiovascular system: S1 & S2 heard, RRR. No JVD. Gastrointestinal system: Abdomen is soft mildly distended diffuse tenderness umbilical hernia Extremities: No pedal edema. Skin: No rashes, lesions or ulcers Psychiatry: Judgement and insight appear normal. Mood & affect appropriate.  Data Reviewed:    Labs: Basic Metabolic Panel: Recent Labs  Lab 06/30/22 1400 07/01/22 1148 07/01/22 2149 07/02/22 0320 07/03/22 0159 07/03/22 1203  NA 118* 120*  119* 118* 126* 124*  K 3.6 3.9  --  3.9 3.6 3.6  CL 85* 88*  --  85* 95* 94*  CO2 22 20*  --  20* 21* 18*  GLUCOSE 123* 125*  --  158* 122* 143*  BUN 39* 35*  --  38* 26* 26*  CREATININE 1.73* 1.81*  --  2.01* 1.54* 1.41*  CALCIUM 8.7 8.6*  --  8.5* 8.7* 8.2*  PHOS  --   --   --   --  3.2  --     GFR Estimated Creatinine Clearance: 49.6 mL/min (A) (by C-G formula based on SCr of 1.41 mg/dL (H)). Liver Function Tests: Recent Labs  Lab 07/01/22 1148 07/02/22 0320 07/03/22 0159  AST 46* 33  --   ALT 25 22  --   ALKPHOS 92 71  --   BILITOT 2.3* 2.5*  --   PROT 6.3* 5.4*  --   ALBUMIN 3.6 3.3* 3.6    No results for input(s): "LIPASE", "AMYLASE" in the last 168 hours. Recent Labs  Lab 07/01/22 1400  AMMONIA 55*    Coagulation profile Recent Labs  Lab 07/01/22 2149 07/02/22 0320  INR 1.4* 1.5*  COVID-19 Labs  No results for input(s): "DDIMER", "FERRITIN", "LDH", "CRP" in the last 72 hours.  Lab Results  Component Value Date   SARSCOV2NAA NEGATIVE 07/02/2021   SARSCOV2NAA NEGATIVE 03/07/2019    CBC: Recent Labs  Lab 07/01/22 1148  WBC 9.0  NEUTROABS 6.3  HGB 10.7*  HCT RESULTS UNAVAILABLE DUE TO INTERFERING SUBSTANCE  MCV RESULTS UNAVAILABLE DUE TO INTERFERING SUBSTANCE  PLT 286    Cardiac Enzymes: No results for input(s): "CKTOTAL", "CKMB", "CKMBINDEX", "TROPONINI" in the last 168 hours. BNP (last 3 results) No results for input(s): "PROBNP" in the last 8760 hours. CBG: No results for input(s): "GLUCAP" in the last 168 hours. D-Dimer: No results for input(s): "DDIMER" in the last 72 hours. Hgb A1c: No results for input(s): "HGBA1C" in the last 72 hours. Lipid Profile: No results for input(s): "CHOL", "HDL", "LDLCALC", "TRIG", "CHOLHDL", "LDLDIRECT" in the last 72 hours. Thyroid function studies: Recent Labs    07/01/22 11-21-47  TSH 1.477    Anemia work up: No results for input(s): "VITAMINB12", "FOLATE", "FERRITIN", "TIBC", "IRON",  "RETICCTPCT" in the last 72 hours. Sepsis Labs: Recent Labs  Lab 07/01/22 1148  WBC 9.0    Microbiology Recent Results (from the past 240 hour(s))  Culture, body fluid w Gram Stain-bottle     Status: None (Preliminary result)   Collection Time: 07/02/22 12:11 PM   Specimen: Peritoneal Washings  Result Value Ref Range Status   Specimen Description PERITONEAL  Final   Special Requests NONE  Final   Culture   Final    NO GROWTH < 24 HOURS Performed at [facility], [address], [place], [place] [address]    Report Status PENDING  Incomplete  Gram stain     Status: None   Collection Time: 07/02/22 12:11 PM   Specimen: Peritoneal Washings  Result Value Ref Range Status   Specimen Description PERITONEAL  Final   Special Requests NONE  Final   Gram Stain   Final    NO ORGANISMS SEEN WBC PRESENT, PREDOMINANTLY MONONUCLEAR CYTOSPIN SMEAR Performed at [facility], [address], [place], [place] [address]    Report Status 07/02/2022 FINAL  Final     Medications:    busPIRone  15 mg Oral Q supper   busPIRone  7.5 mg Oral Daily   clonazePAM  0.5 mg Oral QHS   enoxaparin (LOVENOX) injection  40 mg Subcutaneous Daily   folic acid  1 mg Oral QHS   levETIRAcetam  1,000 mg Oral Daily   levothyroxine  88 mcg Oral Q0600   midodrine  15 mg Oral TID WC   pantoprazole  20 mg Oral Daily   rifaximin  550 mg Oral BID   thiamine  100 mg Oral Daily   Continuous Infusions:  cefTRIAXone (ROCEPHIN)  IV 2 g (07/04/22 0819)   octreotide (SANDOSTATIN) 500 mcg in sodium chloride 0.9 % 250 mL (2 mcg/mL) infusion 50 mcg/hr (07/04/22 0600)      LOS: 3 days   Marinda Elk  [facility]  07/04/2022, 8:23 AM

## 2022-07-04 NOTE — Progress Notes (Signed)
[facility] Progress Note  Name: Andrew Patton MRN: [id number] DOB: [birth year]  Chief Complaint:  Presented due to worsened labs  Subjective:  Strict ins/outs are not available but he did have 500 mL uop over 12/2.  He doesn't feel great - hard to specify.  Looking forward to [facility] transplant evaluation outpatient.  Spoke with his wife and daughter at bedside   Review of systems:   Denies shortness of breath or chest pain  Some nausea Per his wife he has baseline confusion about time and the year ------------  Background on consult:  Andrew Patton is a 61 y.o. male with a history of DM, HTN, and alcoholic cirrhosis complicated by ascites and hepatic encephalopathy who presented to the hospital on 11/30 at the direction of [facility] for worsening of hyponatremia and AKI.  He was found to have sodium of 118-120.  GI was consulted.  He has been treated with albumin, midodrine, and octreotide.  He was referred to transplant hepatology at [facility] and is under consideration - appears to have an outpatient appointment scheduled.  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  GI team has reached out to [facility] and transplant hepatology recommended nephrology consult.  Strict ins/outs are ordered but are not being obtained.  Had one unmeasured urine void charted 11/30 and four charted today.  Note that he had a paracentesis today with IR with "3 removed" (no unit).  The patient tells me they removed 3 liters.  He has a standing order for paracentesis every 1-2 weeks.       Intake/Output Summary (Last 24 hours) at 07/04/2022 1206 Last data filed at 07/04/2022 0600 Gross per 24 hour  Intake 1267.66 ml  Output 500 ml  Net 767.66 ml    Vitals:  Vitals:   07/04/22 0400 07/04/22 0431 07/04/22 0447 07/04/22 0756  BP:  (!) 101/43  (!) 104/53  Pulse:  60  (!) 59  Resp: 17 17  17   Temp:  97.8 F (36.6 C)  97.6 F (36.4 C)  TempSrc:  Oral    SpO2:  99%  100%  Weight:   71.3 kg    Height:         Physical Exam:    General: adult male in bed in NAD   HEENT: NCAT aside from bandage over broken nose Eyes: EOMI sclera with small hemorrhages Neck: supple trachea midline Heart: S1S2 no rub Lungs: clear and unlabored  Abdomen: soft/nt/no distension; thin; umbilical hernia  Extremities: no edema appreciated; thin Skin: no rash on extremities  Psych normal mood and affect Neuro: alert and oriented to person and location; provides hx and follows commands; slightly hard of hearing   Medications reviewed   Labs:     Latest Ref Rng & Units 07/03/2022   12:03 PM 07/03/2022    1:59 AM 07/02/2022    3:20 AM  BMP  Glucose 70 - 99 mg/dL 14/08/2021  517  001   BUN 8 - 23 mg/dL 26  26  38   Creatinine 0.61 - 1.24 mg/dL 749  4.49  6.75   Sodium 135 - 145 mmol/L 124  126  118   Potassium 3.5 - 5.1 mmol/L 3.6  3.6  3.9   Chloride 98 - 111 mmol/L 94  95  85   CO2 22 - 32 mmol/L 18  21  20    Calcium 8.9 - 10.3 mg/dL 8.2  8.7  8.5      Assessment/Plan:   #  AKI  - Concerning for hepatorenal syndrome and he was initiated on treatment for the same per GI.  He was also given normal saline.  Certainly did have pre-renal insults and had exposure to bactrim - Continue midodrine at 15 mg TID (and would continue midodrine on discharge) - continue octreotide for now but would consider stopping in a couple of days and assessing his response - continue albumin 25 gram IV (25%) daily until octreotide discontinued  - Strict ins/outs ordered   - Please avoid bactrim - I ordered a renal panel for today and in AM.  Will follow-up today's labs   # Hyponatremia  - Setting of presumed hepatorenal syndrome  - await renal panel from today  - note that he is not a candidate for tolvaptan due to cirrhosis - Please hold spironolactone - Please hold bactrim and would not resume   # Cirrhosis - with ascites and encephalopathy  - paracentesis per GI discretion  - on lactulose and rifaximin    #  HTN  - hx of such  - now hypotensive and on midodrine directed at presumed hepatorenal syndrome    # Macrocytic anemia  - iron low at 37 but 36% sat last check.  B12 and folate ok 05/2022   Disposition - continue inpatient monitoring.  He has transplant evaluation outpatient at [facility] on 12/11  Andrew Emms, MD 07/04/2022 12:37 PM

## 2022-07-04 NOTE — Social Work (Signed)
CSW met with pt at bedside. CSW introduced self and explained her role. Pt denied alcohol use. He reports he has not had alchol in almost 2 years, guest in room confirmed. Pt denied substance use. Pt did not need resources at this time.  Andrew Reeve, LCSW Clinical Social Worker

## 2022-07-04 NOTE — Progress Notes (Signed)
Subjective: Not feeling well.  No acute changes.  Objective: Vital signs in last 24 hours: Temp:  [97.6 F (36.4 C)-97.8 F (36.6 C)] 97.6 F (36.4 C) (12/03 0756) Pulse Rate:  [57-63] 59 (12/03 0756) Resp:  [15-17] 17 (12/03 0756) BP: (101-106)/(43-54) 104/53 (12/03 0756) SpO2:  [99 %-100 %] 100 % (12/03 0756) Weight:  [71.3 kg] 71.3 kg (12/03 0447) Last BM Date : 07/03/22  Intake/Output from previous day: 12/02 0701 - 12/03 0700 In: 1267.7 [P.O.:600; I.V.:567.7; IV Piggyback:100] Out: 500 [Urine:500] Intake/Output this shift: No intake/output data recorded.  General appearance: alert and no distress GI: Reducible umbilical hernia  Lab Results: No results for input(s): "WBC", "HGB", "HCT", "PLT" in the last 72 hours. BMET Recent Labs    07/03/22 0159 07/03/22 1203 07/04/22 0137  NA 126* 124* 126*  K 3.6 3.6 3.4*  CL 95* 94* 99  CO2 21* 18* 21*  GLUCOSE 122* 143* 114*  BUN 26* 26* 25*  CREATININE 1.54* 1.41* 1.35*  CALCIUM 8.7* 8.2* 8.0*   LFT Recent Labs    07/02/22 0320 07/03/22 0159 07/04/22 0137  PROT 5.4*  --   --   ALBUMIN 3.3*   < > 3.6  AST 33  --   --   ALT 22  --   --   ALKPHOS 71  --   --   BILITOT 2.5*  --   --    < > = values in this interval not displayed.   PT/INR Recent Labs    07/01/22 2149 07/02/22 0320  LABPROT 17.4* 17.8*  INR 1.4* 1.5*   Hepatitis Panel No results for input(s): "HEPBSAG", "HCVAB", "HEPAIGM", "HEPBIGM" in the last 72 hours. C-Diff No results for input(s): "CDIFFTOX" in the last 72 hours. Fecal Lactopherrin No results for input(s): "FECLLACTOFRN" in the last 72 hours.  Studies/Results: No results found.  Medications: Scheduled:  busPIRone  15 mg Oral Q supper   busPIRone  7.5 mg Oral Daily   clonazePAM  0.5 mg Oral QHS   enoxaparin (LOVENOX) injection  40 mg Subcutaneous Daily   folic acid  1 mg Oral QHS   levETIRAcetam  1,000 mg Oral Daily   levothyroxine  88 mcg Oral Q0600   midodrine  15 mg Oral  TID WC   pantoprazole  20 mg Oral Daily   rifaximin  550 mg Oral BID   thiamine  100 mg Oral Daily   Continuous:  cefTRIAXone (ROCEPHIN)  IV 2 g (07/04/22 0819)   octreotide (SANDOSTATIN) 500 mcg in sodium chloride 0.9 % 250 mL (2 mcg/mL) infusion 50 mcg/hr (07/04/22 1152)    Assessment/Plan: 1) ETOH cirrhosis. 2) AKI. 3) Hyponatremia. 4) Umbilical hernia. 5) Sarcopenia.   He remains clinically stable.  His serum sodium appears to be stable in the mid 120's range.  His AKI continues to improve.  Today he does not feel well overall, but it is difficult to isolate any specific source.  His umbilical hernia was isolated to a 1 cm linea alba defect.  The hernia was reduced, but it quickly recurred when pressure was relieved.  The patient has significant sarcopenia.  He was strongly encouraged to perform strengthening exercises and mobility exercises.  His wife states that she will help him.  There is a higher risk for mortality with sarcopenia in the cirrhotic state.  Plan: 1) Continue with the current medical management. 2) Encouraged mobility and weight bearing exercises. 3)  GI to resume care in the AM.   LOS:  3 days   Andrew Patton D 07/04/2022, 3:16 PM

## 2022-07-05 DIAGNOSIS — K7290 Hepatic failure, unspecified without coma: Secondary | ICD-10-CM | POA: Diagnosis not present

## 2022-07-05 DIAGNOSIS — K429 Umbilical hernia without obstruction or gangrene: Secondary | ICD-10-CM

## 2022-07-05 DIAGNOSIS — K767 Hepatorenal syndrome: Secondary | ICD-10-CM | POA: Diagnosis not present

## 2022-07-05 DIAGNOSIS — R188 Other ascites: Secondary | ICD-10-CM | POA: Diagnosis not present

## 2022-07-05 DIAGNOSIS — E871 Hypo-osmolality and hyponatremia: Secondary | ICD-10-CM | POA: Diagnosis not present

## 2022-07-05 LAB — RENAL FUNCTION PANEL
Albumin: 3.3 g/dL — ABNORMAL LOW (ref 3.5–5.0)
Anion gap: 10 (ref 5–15)
BUN: 24 mg/dL — ABNORMAL HIGH (ref 8–23)
CO2: 19 mmol/L — ABNORMAL LOW (ref 22–32)
Calcium: 8 mg/dL — ABNORMAL LOW (ref 8.9–10.3)
Chloride: 97 mmol/L — ABNORMAL LOW (ref 98–111)
Creatinine, Ser: 1.42 mg/dL — ABNORMAL HIGH (ref 0.61–1.24)
GFR, Estimated: 56 mL/min — ABNORMAL LOW (ref >60)
Glucose, Bld: 109 mg/dL — ABNORMAL HIGH (ref 70–99)
Phosphorus: 3.2 mg/dL (ref 2.5–4.6)
Potassium: 3.2 mmol/L — ABNORMAL LOW (ref 3.5–5.1)
Sodium: 126 mmol/L — ABNORMAL LOW (ref 135–145)

## 2022-07-05 LAB — HIV-1 RNA QUANT-NO REFLEX-BLD
HIV 1 RNA Quant: <20 copies/mL
LOG10 HIV-1 RNA: UNDETERMINED log10copy/mL

## 2022-07-05 MED ORDER — LACTULOSE 10 GM/15ML PO SOLN
20.0000 g | Freq: Every day | ORAL | Status: DC
  Administered 2022-07-05 – 2022-07-08 (×4): 20 g via ORAL
  Filled 2022-07-05 (×4): qty 30

## 2022-07-05 MED ORDER — ENSURE ENLIVE PO LIQD
237.0000 mL | Freq: Two times a day (BID) | ORAL | Status: DC
  Administered 2022-07-05 – 2022-07-06 (×3): 237 mL via ORAL

## 2022-07-05 MED ORDER — FUROSEMIDE 20 MG PO TABS
20.0000 mg | ORAL_TABLET | Freq: Two times a day (BID) | ORAL | Status: DC
  Administered 2022-07-05 – 2022-07-08 (×6): 20 mg via ORAL
  Filled 2022-07-05 (×6): qty 1

## 2022-07-05 NOTE — Progress Notes (Addendum)
Daily Rounding Note  07/05/2022, 11:12 AM  LOS: 4 days   SUBJECTIVE:   Chief complaint:  decompensated cirrhosis.      Had a brown stool yesterday.  Did have diarrhea at the previous overnight so lactulose changed from scheduled to as needed.  Patient's wife tells me that at home lactulose causes significant diarrhea so she has cut back the dose to 15 mL daily which is not his diarrhea promoting and patient still manages to have a couple of bowel movements daily. Patient wants to go home.  No physical complaints. Walked around with PT in the hallway today.  OBJECTIVE:         Vital signs in last 24 hours:    Temp:  [98 F (36.7 C)-98.5 F (36.9 C)] 98 F (36.7 C) (12/04 0728) Pulse Rate:  [53-58] 57 (12/04 0728) Resp:  [16-18] 16 (12/04 0728) BP: (97-101)/(41-50) 99/45 (12/04 0728) SpO2:  [99 %-100 %] 100 % (12/04 0728) Last BM Date : 07/05/22 Filed Weights   07/02/22 0500 07/03/22 0611 07/04/22 0447  Weight: 72.3 kg 70.9 kg 71.3 kg   General: Looks chronically ill.  Alert, comfortable. Heart: RRR Chest: Clear without labored breathing or cough Abdomen: Soft.  Diffuse tenderness without guarding or rebound.  Umbilical hernia.  Active bowel sounds.  No distention Extremities: No CCE Neuro/Psych: No asterixis.  Appropriate.  Fluid speech.  Able to tell me the date but not the year.  Overall confusion seems mild.  Intake/Output from previous day: 12/03 0701 - 12/04 0700 In: 1185.1 [P.O.:480; I.V.:605.1; IV Piggyback:100] Out: 650 [Urine:650]  Intake/Output this shift: Total I/O In: -  Out: 400 [Urine:400]  Lab Results: No results for input(s): "WBC", "HGB", "HCT", "PLT" in the last 72 hours. BMET Recent Labs    07/03/22 1203 07/04/22 0137 07/05/22 0335  NA 124* 126* 126*  K 3.6 3.4* 3.2*  CL 94* 99 97*  CO2 18* 21* 19*  GLUCOSE 143* 114* 109*  BUN 26* 25* 24*  CREATININE 1.41* 1.35* 1.42*  CALCIUM  8.2* 8.0* 8.0*   LFT Recent Labs    07/03/22 0159 07/04/22 0137 07/05/22 0335  ALBUMIN 3.6 3.6 3.3*   PT/INR No results for input(s): "LABPROT", "INR" in the last 72 hours. Hepatitis Panel No results for input(s): "HEPBSAG", "HCVAB", "HEPAIGM", "HEPBIGM" in the last 72 hours.  Studies/Results: No results found.  Scheduled Meds:  busPIRone  15 mg Oral Q supper   busPIRone  7.5 mg Oral Daily   clonazePAM  0.5 mg Oral QHS   enoxaparin (LOVENOX) injection  40 mg Subcutaneous Daily   feeding supplement  237 mL Oral BID BM   folic acid  1 mg Oral QHS   levETIRAcetam  1,000 mg Oral Daily   levothyroxine  88 mcg Oral Q0600   midodrine  15 mg Oral TID WC   pantoprazole  20 mg Oral Daily   rifaximin  550 mg Oral BID   thiamine  100 mg Oral Daily   Continuous Infusions:  cefTRIAXone (ROCEPHIN)  IV 2 g (07/05/22 0835)   octreotide (SANDOSTATIN) 500 mcg in sodium chloride 0.9 % 250 mL (2 mcg/mL) infusion 50 mcg/hr (07/04/22 2239)   PRN Meds:.acetaminophen, lactulose, ondansetron (ZOFRAN) IV, ondansetron, oxyCODONE  MELD 3.0: 24 at 07/04/2022  1:37 AM MELD-Na: 26 at 07/04/2022  1:37 AM    ASSESMENT:     ETOH cirrhosis.  Decompensated.  Sober for at least 1 year.  Ascites, refractory  to diuretics and diuretics complicated by hyponatremia.  Undergoes weekly paracentesis.  History of SBP 05/26/12.  On Bactrim SBP prophylaxis at home, currently on Rocephin.    Hepatic encephalopathy.  Rifaximin bid, Lactulose prn in place.  Remains mildly confused.          AKI.  CKD.  Renal following.  Receiving midodrine.  BUN/Creat, GFR improving.    Hyponatremia.  Improving.  Receiving normal saline   PLAN     Add back  Lactulose 15 mL daily.      ? Switch off Rocephin, back to Septra?     Jennye Moccasin  07/05/2022, 11:12 AM Phone [phone]   Attending physician's note   I have taken a history, reviewed the chart and examined the patient. I performed a substantive portion of  this encounter, including complete performance of at least one of the key components, in conjunction with the APP. I agree with the APP's note, impression and recommendations.    61 year old old male with decompensated EtOH cirrhosis MELD 3.0: 24 at 07/04/2022  1:37 AM MELD-Na: 26 at 07/04/2022  1:37 AM  Refractory ascites requiring frequent paracentesis, most recent had 3 L paracentesis Umbilical hernia, worse discomfort when he has tense ascites Low-sodium diet  He was on Bactrim at home for secondary prophylaxis s/p SBP October 2023, Bactrim discontinued due to AKI.  He is currently on ceftriaxone Use cefdinir 300 mg daily on discharge for secondary prophylaxis  Severe hyponatremia in the setting of decompensated cirrhosis  AKI: Followed by nephrology He was on IV albumin On octreotide and midodrine for presumed hepatorenal syndrome  Hepatic encephalopathy: No asterixis on exam.  Continue rifaximin twice daily Use lactulose as needed, hold for diarrhea  Patient has hepatology appointment next Monday for liver transplant evaluation at [facility], he wants to go home  Okay to discharge home from GI standpoint with close outpatient hepatology follow-up   The patient was provided an opportunity to ask questions and all were answered. The patient agreed with the plan and demonstrated an understanding of the instructions.   Iona Beard , MD [phone]

## 2022-07-05 NOTE — Progress Notes (Signed)
Mobility Specialist - Progress Note   07/05/22 1516  Mobility  Activity Ambulated with assistance in hallway  Level of Assistance Standby assist, set-up cues, supervision of patient - no hands on  Assistive Device Cane  Distance Ambulated (ft) 1650 ft  Activity Response Tolerated well  $Mobility charge 1 Mobility    Pt received in bed agreeable to mobility. Tolerated increased distance well this session, no complaints. Left in bed w/ call bell within reach and all needs met.    Sydney Joyce Mobility Specialist Please contact via SecureChat or Rehab office at [phone]  

## 2022-07-05 NOTE — Progress Notes (Signed)
[facility] Progress Note   Subjective:  Strict ins/outs are not available but at least 650 UOP-  kidney function stable last 48 hours-  sodium stable at 42   61 y.o. male with a history of DM, HTN, and alcoholic cirrhosis complicated by ascites and hepatic encephalopathy who presented to the hospital on 11/30 at the direction of [facility] for worsening of hyponatremia and AKI.  He was found to have sodium of 118-120.   He has been treated with albumin, midodrine, and octreotide.  He was referred to transplant hepatology at [facility] and is under consideration - appears to have an outpatient appointment scheduled.  he has been sober since 2022.  GI team has reached out to [facility] and transplant hepatology recommended nephrology consult.  Strict ins/outs are ordered but are not being obtained.  Had one unmeasured urine void charted 11/30 and four charted today.  Note that he had a paracentesis removed 3 liters since admission.  He has a standing order for paracentesis every 1-2 weeks.       Intake/Output Summary (Last 24 hours) at 07/05/2022 1120 Last data filed at 07/05/2022 0806 Gross per 24 hour  Intake 1185.07 ml  Output 1050 ml  Net 135.07 ml    Vitals:  Vitals:   07/04/22 1845 07/04/22 2002 07/05/22 0329 07/05/22 0728  BP: (!) 101/49 (!) 101/41 (!) 99/44 (!) 99/45  Pulse:  (!) 58 (!) 53 (!) 57  Resp:  16 18 16   Temp:  98.5 F (36.9 C) 98.4 F (36.9 C) 98 F (36.7 C)  TempSrc:  Oral Oral Oral  SpO2:  100% 99% 100%  Weight:      Height:         Physical Exam:    General: adult male in bed in NAD   HEENT: NCAT aside from bandage over broken nose Eyes: EOMI sclera with small hemorrhages Neck: supple trachea midline Heart: S1S2 no rub Lungs: clear and unlabored  Abdomen: soft/nt/no distension; thin; umbilical hernia  Extremities: no edema appreciated; thin  Psych normal mood and affect Neuro: alert and oriented to person and location; provides hx and follows  commands; slightly hard of hearing   Medications reviewed   Labs:     Latest Ref Rng & Units 07/05/2022    3:35 AM 07/04/2022    1:37 AM 07/03/2022   12:03 PM  BMP  Glucose 70 - 99 mg/dL 14/09/2021  563  149   BUN 8 - 23 mg/dL 24  25  26    Creatinine 0.61 - 1.24 mg/dL 702   6.37   Sodium 135 - 145 mmol/L 126  126  124   Potassium 3.5 - 5.1 mmol/L 3.2  3.4  3.6   Chloride 98 - 111 mmol/L 97  99  94   CO2 22 - 32 mmol/L 19  21  18    Calcium 8.9 - 10.3 mg/dL 8.0  8.0  8.2      Assessment/Plan:   # AKI  - Concerning for hepatorenal syndrome and he was initiated on treatment for the same per GI.  He was also given normal saline.  Certainly did have pre-renal insults and had exposure to bactrim.  Renal function improved from admit - Continue midodrine at 15 mg TID (and would continue midodrine on discharge) - continue octreotide for now but would consider stopping in a couple of days and assessing his response -  albumin 25 gram IV (25%) has been stopped     #  Hyponatremia  - Setting of presumed hepatorenal syndrome -  reset osmostat - note that he is not a candidate for tolvaptan due to cirrhosis  AM cortisol a little low but not sure what to make of it-  urine osm is 375 while serum osm 260-  was on lexapro as OP-  has been on hold.  I want to give him low dose lasix to not cause diuresis but to make his urine more dilute to regulate his sodium    # Cirrhosis - with ascites and encephalopathy  - paracentesis per GI discretion  - on lactulose and rifaximin    # HTN  - hx of such  - now hypotensive and on midodrine directed at presumed hepatorenal syndrome    # Macrocytic anemia  - iron low at 37 but 36% sat last check.  B12 and folate ok 05/2022   Disposition - I would say if we can get sodium close to 130 I would be comfortable with him being discharged   Cecille Aver, MD 07/05/2022 11:20 AM

## 2022-07-05 NOTE — Progress Notes (Signed)
Mobility Specialist - Progress Note   07/05/22 0900  Mobility  Activity Ambulated with assistance in hallway  Level of Assistance Standby assist, set-up cues, supervision of patient - no hands on  Assistive Device Cane  Distance Ambulated (ft) 1100 ft  Activity Response Tolerated well       Pt received in bed agreeable to mobility. No complaints throughout. Left in bed w/ call bell in reach and all needs met.   [place] Specialist Please contact via SecureChat or Rehab office at [phone]

## 2022-07-05 NOTE — Progress Notes (Signed)
[facility] PROGRESS NOTE    Progress Note  Andrew Patton  SNK:[id number] DOB: Oct 23, 1960 DOA: 07/01/2022 PCP: [name], MD     Brief Narrative:   Andrew Patton is an 61 y.o. male past medical history of recurrent decompensated hepatic cirrhosis secondary to alcohol abuse, SBP, recurrent ascites with weekly paracentesis, chronic hyponatremia seizure disorder sent from  GI for worsening hyponatremia and acute kidney injury patient has been recently treated in the hospital for SBP and acute kidney injury discharged home on prophylactic antibiotics started having abdominal distention and lower extremity edema has gained 3 pounds in the last few days he is also been having abdominal pain and unsteady gait.  Labs in the ED showed acute kidney injury of 1.8 (previous lab 1.2) sodium 120 potassium 3.9 bicarb 20.  When he was discharged from the hospital he weighed around 68 kg on admission 72.3 kg.   Assessment/Plan:   Acute on chronic hypervolemic hyponatremia: Sodium is stabilized at 126 with current management further management per renal. He remains clinically stable.  Acute kidney injury/hepatorenal syndrome: Suspect hepatorenal syndrome. Hold Bactrim, Lasix and Aldactone. Nephrology was consulted initially started on midodrine Sandostatin and albumin. Nephrology recommended to continue albumin until octreotide is discontinued. Fluids were KVO creatinine has stabilized at 1.41.3.  Abd pain/Decompensated hepatic cirrhosis: With recurrent ascites, his weight is increasing. IR has been consulted for paracentesis removed 3 L of fluid currently on IV Rocephin. INR 1.5 . Continue IV Rocephin abdominal pain seems to be improved. GI to dictate antibiotic coverage.  Seizure disorder: Continue Keppra.  Anxiety/depression: Continue BuSpar and Klonopin.   DVT prophylaxis: lovenox Family Communication:Wife Status is: Inpatient Remains inpatient appropriate  because: Hyponatremia abdominal pain    Code Status:     Code Status Orders  (From admission, onward)           Start     Ordered   07/01/22 1704  Full code  Continuous        07/01/22 1705           Code Status History     Date Active Date Inactive Code Status Order ID Comments User Context   05/27/2022 1927 06/02/2022 0149 Full Code [id number]  Charlsie Quest, MD ED   02/19/2022 2122 02/22/2022 1907 Full Code [id number]  Hillary Bow, DO Inpatient   07/02/2021 2128 07/06/2021 1842 Full Code [id number]  Anselm Jungling, DO ED   03/07/2019 2356 03/09/2019 2048 Full Code [id number]  Pearson Grippe, MD ED      Advance Directive Documentation    Flowsheet Row Most Recent Value  Type of Advance Directive Healthcare Power of Attorney  Pre-existing out of facility DNR order (yellow form or pink MOST form) --  "MOST" Form in Place? --         IV Access:   Peripheral IV   Procedures and diagnostic studies:   No results found.   Medical Consultants:   None.   Subjective:    Andrew Patton abdominal pain better he feels much better than yesterday.  Objective:    Vitals:   07/04/22 1845 07/04/22 2002 07/05/22 0329 07/05/22 0728  BP: (!) 101/49 (!) 101/41 (!) 99/44 (!) 99/45  Pulse:  (!) 58 (!) 53 (!) 57  Resp:  16 18 16   Temp:  98.5 F (36.9 C) 98.4 F (36.9 C) 98 F (36.7 C)  TempSrc:  Oral Oral Oral  SpO2:  100% 99% 100%  Weight:  Height:       SpO2: 100 %   Intake/Output Summary (Last 24 hours) at 07/05/2022 0936 Last data filed at 07/05/2022 0806 Gross per 24 hour  Intake 1185.07 ml  Output 1050 ml  Net 135.07 ml    Filed Weights   07/02/22 0500 07/03/22 0611 07/04/22 0447  Weight: 72.3 kg 70.9 kg 71.3 kg    Exam: General exam: In no acute distress. Respiratory system: Good air movement and clear to auscultation. Cardiovascular system: S1 & S2 heard, RRR. No JVD. Extremities: No pedal edema. Skin: No rashes, lesions or  ulcers Psychiatry: Judgement and insight appear normal. Mood & affect appropriate. Gastrointestinal system: Abdomen is soft mildly distended diffuse tenderness umbilical hernia   Data Reviewed:    Labs: Basic Metabolic Panel: Recent Labs  Lab 07/02/22 0320 07/03/22 0159 07/03/22 1203 07/04/22 0137 07/05/22 0335  NA 118* 126* 124* 126* 126*  K 3.9 3.6 3.6 3.4* 3.2*  CL 85* 95* 94* 99 97*  CO2 20* 21* 18* 21* 19*  GLUCOSE 158* 122* 143* 114* 109*  BUN 38* 26* 26* 25* 24*  CREATININE 2.01* 1.54* 1.41* 1.35* 1.42*  CALCIUM 8.5* 8.7* 8.2* 8.0* 8.0*  PHOS  --  3.2  --  3.0 3.2    GFR Estimated Creatinine Clearance: 49.3 mL/min (A) (by C-G formula based on SCr of 1.42 mg/dL (H)). Liver Function Tests: Recent Labs  Lab 07/01/22 1148 07/02/22 0320 07/03/22 0159 07/04/22 0137 07/05/22 0335  AST 46* 33  --   --   --   ALT 25 22  --   --   --   ALKPHOS 92 71  --   --   --   BILITOT 2.3* 2.5*  --   --   --   PROT 6.3* 5.4*  --   --   --   ALBUMIN 3.6 3.3* 3.6 3.6 3.3*    No results for input(s): "LIPASE", "AMYLASE" in the last 168 hours. Recent Labs  Lab 07/01/22 1400  AMMONIA 55*    Coagulation profile Recent Labs  Lab 07/01/22 2149 07/02/22 0320  INR 1.4* 1.5*    COVID-19 Labs  No results for input(s): "DDIMER", "FERRITIN", "LDH", "CRP" in the last 72 hours.  Lab Results  Component Value Date   SARSCOV2NAA NEGATIVE 07/02/2021   SARSCOV2NAA NEGATIVE 03/07/2019    CBC: Recent Labs  Lab 07/01/22 1148  WBC 9.0  NEUTROABS 6.3  HGB 10.7*  HCT RESULTS UNAVAILABLE DUE TO INTERFERING SUBSTANCE  MCV RESULTS UNAVAILABLE DUE TO INTERFERING SUBSTANCE  PLT 286    Cardiac Enzymes: No results for input(s): "CKTOTAL", "CKMB", "CKMBINDEX", "TROPONINI" in the last 168 hours. BNP (last 3 results) No results for input(s): "PROBNP" in the last 8760 hours. CBG: No results for input(s): "GLUCAP" in the last 168 hours. D-Dimer: No results for input(s): "DDIMER"  in the last 72 hours. Hgb A1c: No results for input(s): "HGBA1C" in the last 72 hours. Lipid Profile: No results for input(s): "CHOL", "HDL", "LDLCALC", "TRIG", "CHOLHDL", "LDLDIRECT" in the last 72 hours. Thyroid function studies: No results for input(s): "TSH", "T4TOTAL", "T3FREE", "THYROIDAB" in the last 72 hours.  Invalid input(s): "FREET3"  Anemia work up: No results for input(s): "VITAMINB12", "FOLATE", "FERRITIN", "TIBC", "IRON", "RETICCTPCT" in the last 72 hours. Sepsis Labs: Recent Labs  Lab 07/01/22 1148  WBC 9.0    Microbiology Recent Results (from the past 240 hour(s))  Culture, body fluid w Gram Stain-bottle     Status: None (Preliminary result)  Collection Time: 07/02/22 12:11 PM   Specimen: Peritoneal Washings  Result Value Ref Range Status   Specimen Description PERITONEAL  Final   Special Requests NONE  Final   Culture   Final    NO GROWTH 2 DAYS Performed at [facility], [address] [place], [place] [address]    Report Status PENDING  Incomplete  Gram stain     Status: None   Collection Time: 07/02/22 12:11 PM   Specimen: Peritoneal Washings  Result Value Ref Range Status   Specimen Description PERITONEAL  Final   Special Requests NONE  Final   Gram Stain   Final    NO ORGANISMS SEEN WBC PRESENT, PREDOMINANTLY MONONUCLEAR CYTOSPIN SMEAR Performed at [facility], [address] [place], [place] [address]    Report Status 07/02/2022 FINAL  Final     Medications:    busPIRone  15 mg Oral Q supper   busPIRone  7.5 mg Oral Daily   clonazePAM  0.5 mg Oral QHS   enoxaparin (LOVENOX) injection  40 mg Subcutaneous Daily   folic acid  1 mg Oral QHS   levETIRAcetam  1,000 mg Oral Daily   levothyroxine  88 mcg Oral Q0600   midodrine  15 mg Oral TID WC   pantoprazole  20 mg Oral Daily   rifaximin  550 mg Oral BID   thiamine  100 mg Oral Daily   Continuous Infusions:  cefTRIAXone (ROCEPHIN)  IV 2 g (07/05/22 0835)   octreotide  (SANDOSTATIN) 500 mcg in sodium chloride 0.9 % 250 mL (2 mcg/mL) infusion 50 mcg/hr (07/04/22 2239)      LOS: 4 days   Marinda Elk  [facility]  07/05/2022, 9:36 AM

## 2022-07-05 NOTE — Progress Notes (Signed)
    [facility]   Reason for visit: Follow up on history of SBP  Interval History: tolerating ceftriaxone well, wife at bedside.   Day 4 ceftriaxone   Physical Exam: Constitutional:  Vitals:   07/05/22 0329 07/05/22 0728  BP: (!) 99/44 (!) 99/45  Pulse: (!) 53 (!) 57  Resp: 18 16  Temp: 98.4 F (36.9 C) 98 F (36.7 C)  SpO2: 99% 100%   patient appears in NAD Respiratory: Normal respiratory effort; CTA B  Review of Systems: Constitutional: negative for fevers and chills  Lab Results  Component Value Date   WBC 9.0 07/01/2022   HGB 10.7 (L) 07/01/2022   HCT RESULTS UNAVAILABLE DUE TO INTERFERING SUBSTANCE 07/01/2022   MCV RESULTS UNAVAILABLE DUE TO INTERFERING SUBSTANCE 07/01/2022   PLT 286 07/01/2022    Lab Results  Component Value Date   CREATININE 1.42 (H) 07/05/2022   BUN 24 (H) 07/05/2022   NA 126 (L) 07/05/2022   K 3.2 (L) 07/05/2022   CL 97 (L) 07/05/2022   CO2 19 (L) 07/05/2022    Lab Results  Component Value Date   ALT 22 07/02/2022   AST 33 07/02/2022   ALKPHOS 71 07/02/2022     Microbiology: Recent Results (from the past 240 hour(s))  Culture, body fluid w Gram Stain-bottle     Status: None (Preliminary result)   Collection Time: 07/02/22 12:11 PM   Specimen: Peritoneal Washings  Result Value Ref Range Status   Specimen Description PERITONEAL  Final   Special Requests NONE  Final   Culture   Final    NO GROWTH 3 DAYS Performed at [facility], [address] [place], [place] [address]    Report Status PENDING  Incomplete  Gram stain     Status: None   Collection Time: 07/02/22 12:11 PM   Specimen: Peritoneal Washings  Result Value Ref Range Status   Specimen Description PERITONEAL  Final   Special Requests NONE  Final   Gram Stain   Final    NO ORGANISMS SEEN WBC PRESENT, PREDOMINANTLY MONONUCLEAR CYTOSPIN SMEAR Performed at [facility], [address], [place], [place] [address]    Report  Status 07/02/2022 FINAL  Final    Impression/Plan:  1. History of spontaneous bacterial peritonitis - no significant concerns at this time for active infection.  He has been on ceftriaxone  Can transition to cefdinir 300 mg daily  I will sign off, call with questions  I have personally spent 50 minutes involved in face-to-face and non-face-to-face activities for this patient on the day of the visit. Professional time spent includes the following activities: Preparing to see the patient (review of tests), Obtaining and/or reviewing separately obtained history (admission/discharge record), Performing a medically appropriate examination and/or evaluation , Ordering medications/tests/procedures, referring and communicating with other health care professionals, Documenting clinical information in the EMR, Independently interpreting results (not separately reported), Communicating results to the patient/family/caregiver, Counseling and educating the patient/family/caregiver and Care coordination (not separately reported).

## 2022-07-06 DIAGNOSIS — K766 Portal hypertension: Secondary | ICD-10-CM | POA: Diagnosis not present

## 2022-07-06 DIAGNOSIS — K7682 Hepatic encephalopathy: Secondary | ICD-10-CM | POA: Diagnosis not present

## 2022-07-06 DIAGNOSIS — E871 Hypo-osmolality and hyponatremia: Secondary | ICD-10-CM

## 2022-07-06 DIAGNOSIS — K7031 Alcoholic cirrhosis of liver with ascites: Secondary | ICD-10-CM | POA: Diagnosis not present

## 2022-07-06 DIAGNOSIS — K429 Umbilical hernia without obstruction or gangrene: Secondary | ICD-10-CM | POA: Diagnosis not present

## 2022-07-06 DIAGNOSIS — N179 Acute kidney failure, unspecified: Secondary | ICD-10-CM | POA: Diagnosis not present

## 2022-07-06 DIAGNOSIS — K767 Hepatorenal syndrome: Secondary | ICD-10-CM | POA: Diagnosis not present

## 2022-07-06 DIAGNOSIS — Z01818 Encounter for other preprocedural examination: Secondary | ICD-10-CM | POA: Diagnosis not present

## 2022-07-06 DIAGNOSIS — K7460 Unspecified cirrhosis of liver: Secondary | ICD-10-CM | POA: Diagnosis not present

## 2022-07-06 LAB — BASIC METABOLIC PANEL
Anion gap: 9 (ref 5–15)
BUN: 27 mg/dL — ABNORMAL HIGH (ref 8–23)
CO2: 21 mmol/L — ABNORMAL LOW (ref 22–32)
Calcium: 8.1 mg/dL — ABNORMAL LOW (ref 8.9–10.3)
Chloride: 99 mmol/L (ref 98–111)
Creatinine, Ser: 1.51 mg/dL — ABNORMAL HIGH (ref 0.61–1.24)
GFR, Estimated: 52 mL/min — ABNORMAL LOW (ref >60)
Glucose, Bld: 127 mg/dL — ABNORMAL HIGH (ref 70–99)
Potassium: 3.1 mmol/L — ABNORMAL LOW (ref 3.5–5.1)
Sodium: 129 mmol/L — ABNORMAL LOW (ref 135–145)

## 2022-07-06 LAB — PATHOLOGIST SMEAR REVIEW

## 2022-07-06 MED ORDER — POTASSIUM CHLORIDE CRYS ER 20 MEQ PO TBCR
20.0000 meq | EXTENDED_RELEASE_TABLET | Freq: Two times a day (BID) | ORAL | Status: DC
  Administered 2022-07-06 – 2022-07-08 (×5): 20 meq via ORAL
  Filled 2022-07-06 (×5): qty 1

## 2022-07-06 MED ORDER — COSYNTROPIN 0.25 MG IJ SOLR
0.2500 mg | Freq: Once | INTRAMUSCULAR | Status: AC
  Administered 2022-07-07: 0.25 mg via INTRAVENOUS
  Filled 2022-07-06: qty 0.25

## 2022-07-06 NOTE — Progress Notes (Signed)
[facility] Progress Note   Subjective:   at least 750 UOP-  kidney function stable -  sodium up to 129-  BP soft   61 y.o. male with  DM, HTN, and alcoholic cirrhosis complicated by ascites and hepatic encephalopathy who presented on 11/30 at the direction of [facility] for worsening of hyponatremia and AKI.  He was found to have sodium of 118-120.   He has been treated with albumin, midodrine, and octreotide.  He was referred to transplant hepatology at [facility] and is under consideration - appears to have an outpatient appointment scheduled.  he has been sober since 2022.  GI team has reached out to [facility] and transplant hepatology recommended nephrology consult.  Strict ins/outs are ordered but are not being obtained.  Had one unmeasured urine void charted 11/30 and four charted today.  Note that he had a paracentesis removed 3 liters since admission.  He has a standing order for paracentesis every 1-2 weeks.       Intake/Output Summary (Last 24 hours) at 07/06/2022 1153 Last data filed at 07/06/2022 0930 Gross per 24 hour  Intake 720 ml  Output 350 ml  Net 370 ml    Vitals:  Vitals:   07/05/22 1616 07/05/22 2010 07/06/22 0331 07/06/22 0809  BP: (!) 111/56 (!) 94/46 (!) 93/49 (!) 94/52  Pulse: (!) 53 (!) 51 (!) 51 61  Resp: 16 16 16 18   Temp: 98.5 F (36.9 C) 98.6 F (37 C) 98 F (36.7 C) 97.9 F (36.6 C)  TempSrc: Oral Oral Oral Oral  SpO2: 100% 100% 100% 100%  Weight:      Height:         Physical Exam:    General: adult male in bed in NAD   HEENT: NCAT aside from bandage over broken nose Eyes: EOMI sclera with small hemorrhages Neck: supple trachea midline Heart: S1S2 no rub Lungs: clear and unlabored  Abdomen: soft/nt/no distension; thin; umbilical hernia  Extremities: no edema appreciated; thin  Psych normal mood and affect Neuro: alert and oriented to person and location; provides hx and follows commands; slightly hard of hearing   Medications  reviewed   Labs:     Latest Ref Rng & Units 07/06/2022    3:45 AM 07/05/2022    3:35 AM 07/04/2022    1:37 AM  BMP  Glucose 70 - 99 mg/dL 14/09/2021  220  254   BUN 8 - 23 mg/dL 27  24  25    Creatinine 0.61 - 1.24 mg/dL 270   6.23   Sodium 135 - 145 mmol/L 129  126  126   Potassium 3.5 - 5.1 mmol/L 3.1  3.2  3.4   Chloride 98 - 111 mmol/L 99  97  99   CO2 22 - 32 mmol/L 21  19  21    Calcium 8.9 - 10.3 mg/dL 8.1  8.0  8.0      Assessment/Plan:   # AKI  - Concerning for hepatorenal syndrome and he was initiated on treatment for the same per GI.   Certainly did have pre-renal insults and had exposure to bactrim.  Renal function improved from admit - Continue midodrine at 15 mg TID (and would continue midodrine on discharge) - continue octreotide for now but will stop today as has been on it for 5 days -  albumin 25 gram IV (25%) has been stopped     # Hyponatremia  - Setting of presumed hepatorenal syndrome -  reset  osmostat - note that he is not a candidate for tolvaptan due to cirrhosis  AM cortisol a little low but not sure what to make of it-  urine osm is 375 while serum osm 260-  was on lexapro as OP-  has been on hold.  I want to give him low dose lasix to not cause diuresis but to make his urine more dilute to regulate his sodium-  if we can keep sodium right at 129 I would be happy    # Cirrhosis - with ascites and encephalopathy  - paracentesis per GI discretion  - on lactulose and rifaximin    # HTN  - hx of such  - now hypotensive and on midodrine directed at presumed hepatorenal syndrome    # Macrocytic anemia  - iron low at 37 but 36% sat last check.  B12 and folate ok 05/2022   Disposition - I am going to stop octreotide-  would not expect a  drastic backslide once this is stopped.  I would keep him on the lasix and the midodrine for discharge.  He will likely continue to need potassium as well. I will sign off- dont think he needs nephrology specific follow up-  call with questions.   I think a discharge tomorrow is a reasonable goal   Cecille Aver, MD 07/06/2022 11:53 AM

## 2022-07-06 NOTE — Progress Notes (Signed)
Mobility Specialist - Progress Note   07/06/22 1400  Mobility  Activity Ambulated with assistance in hallway  Level of Assistance Standby assist, set-up cues, supervision of patient - no hands on  Assistive Device Cane  Distance Ambulated (ft) 1650 ft  Activity Response Tolerated well       Pt received in bed agreeable to mobility. No complaints throughout, tolerated increased distance well. Left in bed w/ call bell in reach and all needs met.   [place] Specialist Please contact via SecureChat or Rehab office at [phone]

## 2022-07-06 NOTE — Progress Notes (Signed)
[facility] PROGRESS NOTE    Progress Note  Andrew Patton  NGE:[id number] DOB: 09/29/1960 DOA: 07/01/2022 PCP: [name], MD     Brief Narrative:   Andrew Patton is an 61 y.o. male past medical history of recurrent decompensated hepatic cirrhosis secondary to alcohol abuse, SBP, recurrent ascites with weekly paracentesis, chronic hyponatremia seizure disorder sent from [facility] for worsening hyponatremia and acute kidney injury patient has been recently treated in the hospital for SBP and acute kidney injury discharged home on prophylactic antibiotics started having abdominal distention and lower extremity edema has gained 3 pounds in the last few days he is also been having abdominal pain and unsteady gait.  Labs in the ED showed acute kidney injury of 1.8 (previous lab 1.2) sodium 120 potassium 3.9 bicarb 20.  When he was discharged from the hospital he weighed around 68 kg on admission 72.3 kg.   Assessment/Plan:   Acute on chronic hypervolemic hyponatremia: Remains.  Proved today to 129 with current management further management per renal. He remains clinically stable. Renal recommended to stop albumin.  Acute kidney injury/hepatorenal syndrome: Suspect hepatorenal syndrome. Nephrology was consulted initially started on midodrine Sandostatin and albumin. Continue midodrine and octreotide albumin has been stopped. Creatinine has stabilized at 1.5.  Potassium is low replete orally  Abd pain/Decompensated hepatic cirrhosis: With recurrent ascites, his weight is increasing. IR has been consulted for paracentesis removed 3 L of fluid currently on IV Rocephin. INR 1.5 . Has been transitioned to oral Omnicef which she will continue for 10 days. GI to dictate antibiotic coverage.  Seizure disorder: Continue Keppra.  Anxiety/depression: Continue BuSpar and Klonopin.   DVT prophylaxis: lovenox Family Communication:Wife Status is: Inpatient Remains inpatient  appropriate because: Hyponatremia abdominal pain    Code Status:     Code Status Orders  (From admission, onward)           Start     Ordered   07/01/22 1704  Full code  Continuous        07/01/22 1705           Code Status History     Date Active Date Inactive Code Status Order ID Comments User Context   05/27/2022 1927 06/02/2022 0149 Full Code [id number]  Charlsie Quest, MD ED   02/19/2022 2122 02/22/2022 1907 Full Code [id number]  Hillary Bow, DO Inpatient   07/02/2021 2128 07/06/2021 1842 Full Code [id number]  Anselm Jungling, DO ED   03/07/2019 2356 03/09/2019 2048 Full Code [id number]  Pearson Grippe, MD ED      Advance Directive Documentation    Flowsheet Row Most Recent Value  Type of Advance Directive Healthcare Power of Attorney  Pre-existing out of facility DNR order (yellow form or pink MOST form) --  "MOST" Form in Place? --         IV Access:   Peripheral IV   Procedures and diagnostic studies:   No results found.   Medical Consultants:   None.   Subjective:    Andrew Patton feels better. Objective:    Vitals:   07/05/22 1616 07/05/22 2010 07/06/22 0331 07/06/22 0809  BP: (!) 111/56 (!) 94/46 (!) 93/49 (!) 94/52  Pulse: (!) 53 (!) 51 (!) 51 61  Resp: 16 16 16 18   Temp: 98.5 F (36.9 C) 98.6 F (37 C) 98 F (36.7 C) 97.9 F (36.6 C)  TempSrc: Oral Oral Oral Oral  SpO2: 100% 100% 100% 100%  Weight:  Height:       SpO2: 100 %   Intake/Output Summary (Last 24 hours) at 07/06/2022 0906 Last data filed at 07/05/2022 1757 Gross per 24 hour  Intake 480 ml  Output 350 ml  Net 130 ml    Filed Weights   07/02/22 0500 07/03/22 0611 07/04/22 0447  Weight: 72.3 kg 70.9 kg 71.3 kg    Exam: General exam: In no acute distress. Respiratory system: Good air movement and clear to auscultation. Cardiovascular system: S1 & S2 heard, RRR. No JVD. Gastrointestinal system: Abdomen is nondistended, soft and nontender.  Extremities: No  pedal edema. Skin: No rashes, lesions or ulcers Psychiatry: Judgement and insight appear normal. Mood & affect appropriate.  Data Reviewed:    Labs: Basic Metabolic Panel: Recent Labs  Lab 07/03/22 0159 07/03/22 1203 07/04/22 0137 07/05/22 0335 07/06/22 0345  NA 126* 124* 126* 126* 129*  K 3.6 3.6 3.4* 3.2* 3.1*  CL 95* 94* 99 97* 99  CO2 21* 18* 21* 19* 21*  GLUCOSE 122* 143* 114* 109* 127*  BUN 26* 26* 25* 24* 27*  CREATININE 1.54* 1.41* 1.35* 1.42* 1.51*  CALCIUM 8.7* 8.2* 8.0* 8.0* 8.1*  PHOS 3.2  --  3.0 3.2  --     GFR Estimated Creatinine Clearance: 46.4 mL/min (A) (by C-G formula based on SCr of 1.51 mg/dL (H)). Liver Function Tests: Recent Labs  Lab 07/01/22 1148 07/02/22 0320 07/03/22 0159 07/04/22 0137 07/05/22 0335  AST 46* 33  --   --   --   ALT 25 22  --   --   --   ALKPHOS 92 71  --   --   --   BILITOT 2.3* 2.5*  --   --   --   PROT 6.3* 5.4*  --   --   --   ALBUMIN 3.6 3.3* 3.6 3.6 3.3*    No results for input(s): "LIPASE", "AMYLASE" in the last 168 hours. Recent Labs  Lab 07/01/22 1400  AMMONIA 55*    Coagulation profile Recent Labs  Lab 07/01/22 2149 07/02/22 0320  INR 1.4* 1.5*    COVID-19 Labs  No results for input(s): "DDIMER", "FERRITIN", "LDH", "CRP" in the last 72 hours.  Lab Results  Component Value Date   SARSCOV2NAA NEGATIVE 07/02/2021   SARSCOV2NAA NEGATIVE 03/07/2019    CBC: Recent Labs  Lab 07/01/22 1148  WBC 9.0  NEUTROABS 6.3  HGB 10.7*  HCT RESULTS UNAVAILABLE DUE TO INTERFERING SUBSTANCE  MCV RESULTS UNAVAILABLE DUE TO INTERFERING SUBSTANCE  PLT 286    Cardiac Enzymes: No results for input(s): "CKTOTAL", "CKMB", "CKMBINDEX", "TROPONINI" in the last 168 hours. BNP (last 3 results) No results for input(s): "PROBNP" in the last 8760 hours. CBG: No results for input(s): "GLUCAP" in the last 168 hours. D-Dimer: No results for input(s): "DDIMER" in the last 72 hours. Hgb A1c: No results for  input(s): "HGBA1C" in the last 72 hours. Lipid Profile: No results for input(s): "CHOL", "HDL", "LDLCALC", "TRIG", "CHOLHDL", "LDLDIRECT" in the last 72 hours. Thyroid function studies: No results for input(s): "TSH", "T4TOTAL", "T3FREE", "THYROIDAB" in the last 72 hours.  Invalid input(s): "FREET3"  Anemia work up: No results for input(s): "VITAMINB12", "FOLATE", "FERRITIN", "TIBC", "IRON", "RETICCTPCT" in the last 72 hours. Sepsis Labs: Recent Labs  Lab 07/01/22 1148  WBC 9.0    Microbiology Recent Results (from the past 240 hour(s))  Culture, body fluid w Gram Stain-bottle     Status: None (Preliminary result)   Collection Time: 07/02/22  12:11 PM   Specimen: Peritoneal Washings  Result Value Ref Range Status   Specimen Description PERITONEAL  Final   Special Requests NONE  Final   Culture   Final    NO GROWTH 4 DAYS Performed at [facility], [address], [place], [place] [address]    Report Status PENDING  Incomplete  Gram stain     Status: None   Collection Time: 07/02/22 12:11 PM   Specimen: Peritoneal Washings  Result Value Ref Range Status   Specimen Description PERITONEAL  Final   Special Requests NONE  Final   Gram Stain   Final    NO ORGANISMS SEEN WBC PRESENT, PREDOMINANTLY MONONUCLEAR CYTOSPIN SMEAR Performed at [facility], [address] [place], [place] [address]    Report Status 07/02/2022 FINAL  Final     Medications:    busPIRone  15 mg Oral Q supper   busPIRone  7.5 mg Oral Daily   clonazePAM  0.5 mg Oral QHS   enoxaparin (LOVENOX) injection  40 mg Subcutaneous Daily   feeding supplement  237 mL Oral BID BM   folic acid  1 mg Oral QHS   furosemide  20 mg Oral BID   lactulose  20 g Oral Daily   levETIRAcetam  1,000 mg Oral Daily   levothyroxine  88 mcg Oral Q0600   midodrine  15 mg Oral TID WC   pantoprazole  20 mg Oral Daily   rifaximin  550 mg Oral BID   thiamine  100 mg Oral Daily   Continuous Infusions:   cefTRIAXone (ROCEPHIN)  IV 2 g (07/05/22 0835)   octreotide (SANDOSTATIN) 500 mcg in sodium chloride 0.9 % 250 mL (2 mcg/mL) infusion 50 mcg/hr (07/04/22 2239)      LOS: 5 days   Marinda Elk  [facility]  07/06/2022, 9:06 AM

## 2022-07-06 NOTE — Progress Notes (Signed)
[facility] ROUNDING NOTE   Subjective: Sodium improved to 129, creatinine has stabilized   Objective: Vital signs in last 24 hours: Temp:  [97.9 F (36.6 C)-98.6 F (37 C)] 97.9 F (36.6 C) (12/05 0809) Pulse Rate:  [51-61] 61 (12/05 0809) Resp:  [16-18] 18 (12/05 0809) BP: (93-111)/(46-56) 94/52 (12/05 0809) SpO2:  [100 %] 100 % (12/05 0809) Last BM Date : 07/05/22 General: NAD, no asterixis Abdomen: Soft, distended, umbilical hernia Ext: Mild edema    Intake/Output from previous day: 12/04 0701 - 12/05 0700 In: 720 [P.O.:720] Out: 750 [Urine:750] Intake/Output this shift: Total I/O In: 240 [P.O.:240] Out: -    Lab Results: No results for input(s): "WBC", "HGB", "PLT", "MCV" in the last 72 hours. BMET Recent Labs    07/04/22 0137 07/05/22 0335 07/06/22 0345  NA 126* 126* 129*  K 3.4* 3.2* 3.1*  CL 99 97* 99  CO2 21* 19* 21*  GLUCOSE 114* 109* 127*  BUN 25* 24* 27*  CREATININE 1.35* 1.42* 1.51*  CALCIUM 8.0* 8.0* 8.1*   LFT Recent Labs    07/04/22 0137 07/05/22 0335  ALBUMIN 3.6 3.3*   PT/INR No results for input(s): "INR" in the last 72 hours.    Imaging/Other results: No results found.    Assessment &Plan  61 year old old male with decompensated EtOH cirrhosis  MELD 3.0: 24 at 07/04/2022  1:37 AM MELD-Na: 26 at 07/04/2022  1:37 AM  Possible hepatorenal syndrome, creatinine has improved with octreotide, IV albumin and midodrine Albumin and octreotide are discontinued, currently only on midodrine   Refractory ascites requiring frequent paracentesis, most recent had 3 L paracentesis Umbilical hernia, worse discomfort when he has tense ascites Low-sodium diet   He was on Bactrim at home for secondary prophylaxis s/p SBP October 2023, Bactrim discontinued due to AKI.  He is currently on ceftriaxone Use cefdinir 300 mg daily on discharge for secondary prophylaxis   Severe hyponatremia in the setting of decompensated  cirrhosis   AKI: Followed by nephrology He was on IV albumin On octreotide and midodrine for presumed hepatorenal syndrome   Hepatic encephalopathy: No asterixis on exam.  Continue rifaximin twice daily Use lactulose as needed, hold for diarrhea   Patient has hepatology appointment next Monday for liver transplant evaluation at [facility], he wants to go home.  Family has also discussed with [facility] hematology service regarding direct transfer to hepatology service for inpatient transplant eval, if [facility] hepatology is willing to accept transfer we can proceed , if not, plan to discharge patient from the hospital if renal function remains stable and follow-up as outpatient with hepatology clinic    K. Scherry Ran , MD [phone]  [facility]

## 2022-07-07 DIAGNOSIS — K767 Hepatorenal syndrome: Secondary | ICD-10-CM | POA: Diagnosis not present

## 2022-07-07 DIAGNOSIS — K7682 Hepatic encephalopathy: Secondary | ICD-10-CM | POA: Diagnosis not present

## 2022-07-07 DIAGNOSIS — N179 Acute kidney failure, unspecified: Secondary | ICD-10-CM | POA: Diagnosis not present

## 2022-07-07 DIAGNOSIS — E871 Hypo-osmolality and hyponatremia: Secondary | ICD-10-CM | POA: Diagnosis not present

## 2022-07-07 LAB — CULTURE, BODY FLUID W GRAM STAIN -BOTTLE: Culture: NO GROWTH

## 2022-07-07 LAB — BASIC METABOLIC PANEL
Anion gap: 10 (ref 5–15)
BUN: 21 mg/dL (ref 8–23)
CO2: 21 mmol/L — ABNORMAL LOW (ref 22–32)
Calcium: 8.3 mg/dL — ABNORMAL LOW (ref 8.9–10.3)
Chloride: 102 mmol/L (ref 98–111)
Creatinine, Ser: 1.21 mg/dL (ref 0.61–1.24)
GFR, Estimated: >60 mL/min (ref >60)
Glucose, Bld: 105 mg/dL — ABNORMAL HIGH (ref 70–99)
Potassium: 3.7 mmol/L (ref 3.5–5.1)
Sodium: 133 mmol/L — ABNORMAL LOW (ref 135–145)

## 2022-07-07 LAB — ACTH STIMULATION, 3 TIME POINTS
Cortisol, 30 Min: 8.8 ug/dL
Cortisol, 60 Min: 10.5 ug/dL
Cortisol, Base: 3.7 ug/dL

## 2022-07-07 MED ORDER — STERILE WATER FOR INJECTION IJ SOLN
INTRAMUSCULAR | Status: AC
  Administered 2022-07-07: 10 mL
  Filled 2022-07-07: qty 10

## 2022-07-07 MED ORDER — PREDNISONE 10 MG PO TABS
10.0000 mg | ORAL_TABLET | Freq: Every day | ORAL | Status: DC
  Administered 2022-07-07: 10 mg via ORAL
  Filled 2022-07-07: qty 1

## 2022-07-07 MED ORDER — CEFDINIR 300 MG PO CAPS
300.0000 mg | ORAL_CAPSULE | Freq: Two times a day (BID) | ORAL | Status: DC
  Filled 2022-07-07: qty 1

## 2022-07-07 MED ORDER — CEFDINIR 300 MG PO CAPS
300.0000 mg | ORAL_CAPSULE | Freq: Two times a day (BID) | ORAL | Status: DC
  Administered 2022-07-08: 300 mg via ORAL
  Filled 2022-07-07: qty 1

## 2022-07-07 NOTE — Consult Note (Signed)
   [facility] CM Inpatient Consult   07/07/2022  BRANSYN ADAMI [birth year] [id number]  [facility] [employer] [ACO] Patient: [employer] Comm PPO  Primary Care Provider:  Deland Pretty, MD is endorsed as the primary care provider by patient and wife   Patient screened for less than 30 days readmission hospitalization with noted high risk score for unplanned readmission risk to assess for potential [place] [facility] service needs for post hospital transition for care coordination. Patient's electronic medical record was reviewed for needs.   2:10 pm Met with patient and guest at bedside feeding patient lunch at this time.  Explained reason for rounding visit and for post hospital calls for community care needs.  They verbalized no concerns and explained that if there are questions regarding post hospital needs then to express that to the nurse.  Gave an appointment reminder card with a 24 hour nurse line number.  Plan:  Continue to follow progress and disposition to assess for post hospital community care coordination/management needs.  Referral request for community care coordination: no current needs noted.  Of note, [facility] Care Management/Population Health does not replace or interfere with any arrangements made by the Inpatient Transition of Care team.  For questions contact:   Natividad Brood, RN BSN [place]  [phone] business mobile phone Toll free office [phone]  *[place]  [phone] Fax number: [phone] [email or web address]

## 2022-07-07 NOTE — Progress Notes (Signed)
[facility] PROGRESS NOTE    Progress Note  Andrew Patton  FKC:[id number] DOB: Jun 06, 1961 DOA: 07/01/2022 PCP: [name], MD     Brief Narrative:   Andrew Patton is an 61 y.o. male past medical history of recurrent decompensated hepatic cirrhosis secondary to alcohol abuse, SBP, recurrent ascites with weekly paracentesis, chronic hyponatremia seizure disorder sent from [facility] for worsening hyponatremia and acute kidney injury patient has been recently treated in the hospital for SBP and acute kidney injury discharged home on prophylactic antibiotics started having abdominal distention and lower extremity edema has gained 3 pounds in the last few days he is also been having abdominal pain and unsteady gait.  Labs in the ED showed acute kidney injury of 1.8 (previous lab 1.2) sodium 120 potassium 3.9 bicarb 20.  When he was discharged from the hospital he weighed around 68 kg on admission 72.3 kg.   Assessment/Plan:   Acute on chronic hypervolemic hyponatremia: Sodium is improved, octreotide has been discontinued he will be continued on Lasix and midodrine. He remains clinically stable, renal has signed off. Cosyntropin test results is pending.  Acute kidney injury/hepatorenal syndrome: Suspect hepatorenal syndrome. Nephrology was consulted initially started on midodrine Sandostatin and albumin. Octreotide and albumin have been stopped. Nephrology recommended to continue midodrine and Lasix upon discharge.  Hypokalemia: It was repleted orally yesterday basic metabolic panels pending this morning.  Abd pain/Decompensated hepatic cirrhosis: IR has been consulted for paracentesis removed 3 L of fluid currently on IV Rocephin. INR 1.5 . Now on Omnicef which she will continue for total of 10 days.  Seizure disorder: Continue Keppra.  Anxiety/depression: Continue BuSpar and Klonopin.   DVT prophylaxis: lovenox Family Communication:Wife Status is:  Inpatient Remains inpatient appropriate because: Hyponatremia abdominal pain    Code Status:     Code Status Orders  (From admission, onward)           Start     Ordered   07/01/22 1704  Full code  Continuous        07/01/22 1705           Code Status History     Date Active Date Inactive Code Status Order ID Comments User Context   05/27/2022 1927 06/02/2022 0149 Full Code [id number]  Charlsie Quest, MD ED   02/19/2022 2122 02/22/2022 1907 Full Code [id number]  Hillary Bow, DO Inpatient   07/02/2021 2128 07/06/2021 1842 Full Code [id number]  Anselm Jungling, DO ED   03/07/2019 2356 03/09/2019 2048 Full Code [id number]  Pearson Grippe, MD ED      Advance Directive Documentation    Flowsheet Row Most Recent Value  Type of Advance Directive Healthcare Power of Attorney  Pre-existing out of facility DNR order (yellow form or pink MOST form) --  "MOST" Form in Place? --         IV Access:   Peripheral IV   Procedures and diagnostic studies:   No results found.   Medical Consultants:   None.   Subjective:    Andrew Patton feels better no acute complaints Objective:    Vitals:   07/06/22 0331 07/06/22 0809 07/06/22 1729 07/07/22 0647  BP: (!) 93/49 (!) 94/52 (!) 91/49 (!) 98/47  Pulse: (!) 51 61 (!) 55 (!) 58  Resp: 16 18 16 17   Temp: 98 F (36.7 C) 97.9 F (36.6 C) 98.4 F (36.9 C) 98.9 F (37.2 C)  TempSrc: Oral Oral Oral Oral  SpO2: 100% 100% 100%  100%  Weight:      Height:       SpO2: 100 %   Intake/Output Summary (Last 24 hours) at 07/07/2022 0809 Last data filed at 07/06/2022 0930 Gross per 24 hour  Intake 240 ml  Output --  Net 240 ml    Filed Weights   07/02/22 0500 07/03/22 0611 07/04/22 0447  Weight: 72.3 kg 70.9 kg 71.3 kg    Exam: General exam: In no acute distress. Respiratory system: Good air movement and clear to auscultation. Cardiovascular system: S1 & S2 heard, RRR. No JVD. Gastrointestinal system: Abdomen is  nondistended, soft and nontender.  Extremities: No pedal edema. Skin: No rashes, lesions or ulcers Psychiatry: Judgement and insight appear normal. Mood & affect appropriate.  Data Reviewed:    Labs: Basic Metabolic Panel: Recent Labs  Lab 07/03/22 0159 07/03/22 1203 07/04/22 0137 07/05/22 0335 07/06/22 0345  NA 126* 124* 126* 126* 129*  K 3.6 3.6 3.4* 3.2* 3.1*  CL 95* 94* 99 97* 99  CO2 21* 18* 21* 19* 21*  GLUCOSE 122* 143* 114* 109* 127*  BUN 26* 26* 25* 24* 27*  CREATININE 1.54* 1.41* 1.35* 1.42* 1.51*  CALCIUM 8.7* 8.2* 8.0* 8.0* 8.1*  PHOS 3.2  --  3.0 3.2  --     GFR Estimated Creatinine Clearance: 46.4 mL/min (A) (by C-G formula based on SCr of 1.51 mg/dL (H)). Liver Function Tests: Recent Labs  Lab 07/01/22 1148 07/02/22 0320 07/03/22 0159 07/04/22 0137 07/05/22 0335  AST 46* 33  --   --   --   ALT 25 22  --   --   --   ALKPHOS 92 71  --   --   --   BILITOT 2.3* 2.5*  --   --   --   PROT 6.3* 5.4*  --   --   --   ALBUMIN 3.6 3.3* 3.6 3.6 3.3*    No results for input(s): "LIPASE", "AMYLASE" in the last 168 hours. Recent Labs  Lab 07/01/22 1400  AMMONIA 55*    Coagulation profile Recent Labs  Lab 07/01/22 2149 07/02/22 0320  INR 1.4* 1.5*    COVID-19 Labs  No results for input(s): "DDIMER", "FERRITIN", "LDH", "CRP" in the last 72 hours.  Lab Results  Component Value Date   SARSCOV2NAA NEGATIVE 07/02/2021   SARSCOV2NAA NEGATIVE 03/07/2019    CBC: Recent Labs  Lab 07/01/22 1148  WBC 9.0  NEUTROABS 6.3  HGB 10.7*  HCT RESULTS UNAVAILABLE DUE TO INTERFERING SUBSTANCE  MCV RESULTS UNAVAILABLE DUE TO INTERFERING SUBSTANCE  PLT 286    Cardiac Enzymes: No results for input(s): "CKTOTAL", "CKMB", "CKMBINDEX", "TROPONINI" in the last 168 hours. BNP (last 3 results) No results for input(s): "PROBNP" in the last 8760 hours. CBG: No results for input(s): "GLUCAP" in the last 168 hours. D-Dimer: No results for input(s): "DDIMER" in  the last 72 hours. Hgb A1c: No results for input(s): "HGBA1C" in the last 72 hours. Lipid Profile: No results for input(s): "CHOL", "HDL", "LDLCALC", "TRIG", "CHOLHDL", "LDLDIRECT" in the last 72 hours. Thyroid function studies: No results for input(s): "TSH", "T4TOTAL", "T3FREE", "THYROIDAB" in the last 72 hours.  Invalid input(s): "FREET3"  Anemia work up: No results for input(s): "VITAMINB12", "FOLATE", "FERRITIN", "TIBC", "IRON", "RETICCTPCT" in the last 72 hours. Sepsis Labs: Recent Labs  Lab 07/01/22 1148  WBC 9.0    Microbiology Recent Results (from the past 240 hour(s))  Culture, body fluid w Gram Stain-bottle     Status: None (  Preliminary result)   Collection Time: 07/02/22 12:11 PM   Specimen: Peritoneal Washings  Result Value Ref Range Status   Specimen Description PERITONEAL  Final   Special Requests NONE  Final   Culture   Final    NO GROWTH 4 DAYS Performed at [facility], [address], [place], [place] [address]    Report Status PENDING  Incomplete  Gram stain     Status: None   Collection Time: 07/02/22 12:11 PM   Specimen: Peritoneal Washings  Result Value Ref Range Status   Specimen Description PERITONEAL  Final   Special Requests NONE  Final   Gram Stain   Final    NO ORGANISMS SEEN WBC PRESENT, PREDOMINANTLY MONONUCLEAR CYTOSPIN SMEAR Performed at [facility], [address] [place], [place] [address]    Report Status 07/02/2022 FINAL  Final     Medications:    busPIRone  15 mg Oral Q supper   busPIRone  7.5 mg Oral Daily   clonazePAM  0.5 mg Oral QHS   enoxaparin (LOVENOX) injection  40 mg Subcutaneous Daily   feeding supplement  237 mL Oral BID BM   folic acid  1 mg Oral QHS   furosemide  20 mg Oral BID   lactulose  20 g Oral Daily   levETIRAcetam  1,000 mg Oral Daily   levothyroxine  88 mcg Oral Q0600   midodrine  15 mg Oral TID WC   pantoprazole  20 mg Oral Daily   potassium chloride  20 mEq Oral BID   rifaximin   550 mg Oral BID   thiamine  100 mg Oral Daily   Continuous Infusions:  cefTRIAXone (ROCEPHIN)  IV 2 g (07/07/22 0749)      LOS: 6 days   Marinda Elk  [facility]  07/07/2022, 8:09 AM

## 2022-07-08 ENCOUNTER — Other Ambulatory Visit (HOSPITAL_COMMUNITY): Payer: Self-pay

## 2022-07-08 DIAGNOSIS — R748 Abnormal levels of other serum enzymes: Secondary | ICD-10-CM | POA: Diagnosis not present

## 2022-07-08 DIAGNOSIS — K7460 Unspecified cirrhosis of liver: Secondary | ICD-10-CM | POA: Diagnosis not present

## 2022-07-08 DIAGNOSIS — K766 Portal hypertension: Secondary | ICD-10-CM

## 2022-07-08 DIAGNOSIS — K767 Hepatorenal syndrome: Secondary | ICD-10-CM | POA: Diagnosis not present

## 2022-07-08 DIAGNOSIS — E871 Hypo-osmolality and hyponatremia: Secondary | ICD-10-CM | POA: Diagnosis not present

## 2022-07-08 DIAGNOSIS — K2970 Gastritis, unspecified, without bleeding: Secondary | ICD-10-CM

## 2022-07-08 DIAGNOSIS — K3189 Other diseases of stomach and duodenum: Secondary | ICD-10-CM

## 2022-07-08 DIAGNOSIS — R188 Other ascites: Secondary | ICD-10-CM

## 2022-07-08 LAB — BASIC METABOLIC PANEL
Anion gap: 9 (ref 5–15)
BUN: 18 mg/dL (ref 8–23)
CO2: 19 mmol/L — ABNORMAL LOW (ref 22–32)
Calcium: 8.5 mg/dL — ABNORMAL LOW (ref 8.9–10.3)
Chloride: 106 mmol/L (ref 98–111)
Creatinine, Ser: 1.18 mg/dL (ref 0.61–1.24)
GFR, Estimated: >60 mL/min (ref >60)
Glucose, Bld: 130 mg/dL — ABNORMAL HIGH (ref 70–99)
Potassium: 3.9 mmol/L (ref 3.5–5.1)
Sodium: 134 mmol/L — ABNORMAL LOW (ref 135–145)

## 2022-07-08 MED ORDER — MIDODRINE HCL 5 MG PO TABS
15.0000 mg | ORAL_TABLET | Freq: Three times a day (TID) | ORAL | 3 refills | Status: DC
  Filled 2022-07-08 (×2): qty 270, 30d supply, fill #0
  Filled 2022-07-08: qty 170, 19d supply, fill #0
  Filled 2022-07-08: qty 100, 11d supply, fill #0
  Filled 2022-08-02: qty 270, 30d supply, fill #1
  Filled 2022-09-02: qty 270, 30d supply, fill #2

## 2022-07-08 MED ORDER — HYDROCORTISONE 10 MG PO TABS
10.0000 mg | ORAL_TABLET | Freq: Two times a day (BID) | ORAL | Status: DC
  Filled 2022-07-08: qty 1

## 2022-07-08 MED ORDER — CEFDINIR 300 MG PO CAPS
300.0000 mg | ORAL_CAPSULE | Freq: Every day | ORAL | 3 refills | Status: AC
  Filled 2022-07-08: qty 30, 30d supply, fill #0
  Filled 2022-08-02: qty 30, 30d supply, fill #1
  Filled 2022-09-02: qty 30, 30d supply, fill #2
  Filled 2022-09-23 (×2): qty 30, 30d supply, fill #0

## 2022-07-08 MED ORDER — CEFDINIR 300 MG PO CAPS
300.0000 mg | ORAL_CAPSULE | Freq: Every day | ORAL | 0 refills | Status: DC
  Filled 2022-07-08: qty 30, 30d supply, fill #0

## 2022-07-08 MED ORDER — SPIRONOLACTONE 25 MG PO TABS
25.0000 mg | ORAL_TABLET | Freq: Every day | ORAL | 0 refills | Status: DC
  Filled 2022-07-08: qty 30, 30d supply, fill #0

## 2022-07-08 MED ORDER — PREDNISONE 10 MG PO TABS: 10.0000 mg | ORAL_TABLET | Freq: Two times a day (BID) | ORAL | Status: DC

## 2022-07-08 MED ORDER — FUROSEMIDE 20 MG PO TABS
20.0000 mg | ORAL_TABLET | Freq: Two times a day (BID) | ORAL | 3 refills | Status: DC
  Filled 2022-07-08: qty 60, 30d supply, fill #0
  Filled 2022-08-02: qty 60, 30d supply, fill #1
  Filled 2022-09-02: qty 60, 30d supply, fill #2

## 2022-07-08 MED ORDER — HYDROCORTISONE 10 MG PO TABS
10.0000 mg | ORAL_TABLET | Freq: Two times a day (BID) | ORAL | 1 refills | Status: DC
  Filled 2022-07-08: qty 60, 30d supply, fill #0
  Filled 2022-08-02: qty 60, 30d supply, fill #1

## 2022-07-08 MED ORDER — CEFDINIR 300 MG PO CAPS
300.0000 mg | ORAL_CAPSULE | Freq: Two times a day (BID) | ORAL | 0 refills | Status: DC
  Filled 2022-07-08: qty 6, 3d supply, fill #0

## 2022-07-08 NOTE — Plan of Care (Signed)
  Problem: Education: Goal: Knowledge of General Education information will improve Description: Including pain rating scale, medication(s)/side effects and non-pharmacologic comfort measures Outcome: Progressing   Problem: Clinical Measurements: Goal: Ability to maintain clinical measurements within normal limits will improve Outcome: Progressing   

## 2022-07-08 NOTE — Plan of Care (Signed)
  Problem: Education: Goal: Knowledge of General Education information will improve Description: Including pain rating scale, medication(s)/side effects and non-pharmacologic comfort measures 07/08/2022 1134 by Karolee Ohs, RN Outcome: Adequate for Discharge 07/08/2022 1019 by Karolee Ohs, RN Outcome: Progressing   Problem: Health Behavior/Discharge Planning: Goal: Ability to manage health-related needs will improve Outcome: Adequate for Discharge   Problem: Clinical Measurements: Goal: Ability to maintain clinical measurements within normal limits will improve 07/08/2022 1134 by Karolee Ohs, RN Outcome: Adequate for Discharge 07/08/2022 1019 by Karolee Ohs, RN Outcome: Progressing Goal: Will remain free from infection Outcome: Adequate for Discharge Goal: Diagnostic test results will improve Outcome: Adequate for Discharge Goal: Respiratory complications will improve Outcome: Adequate for Discharge Goal: Cardiovascular complication will be avoided Outcome: Adequate for Discharge   Problem: Activity: Goal: Risk for activity intolerance will decrease Outcome: Adequate for Discharge   Problem: Nutrition: Goal: Adequate nutrition will be maintained Outcome: Adequate for Discharge   Problem: Coping: Goal: Level of anxiety will decrease Outcome: Adequate for Discharge   Problem: Elimination: Goal: Will not experience complications related to bowel motility Outcome: Adequate for Discharge Goal: Will not experience complications related to urinary retention Outcome: Adequate for Discharge   Problem: Pain Managment: Goal: General experience of comfort will improve Outcome: Adequate for Discharge   Problem: Safety: Goal: Ability to remain free from injury will improve Outcome: Adequate for Discharge   Problem: Skin Integrity: Goal: Risk for impaired skin integrity will decrease Outcome: Adequate for Discharge   Problem: Education: Goal: Ability to demonstrate  management of disease process will improve Outcome: Adequate for Discharge Goal: Ability to verbalize understanding of medication therapies will improve Outcome: Adequate for Discharge Goal: Individualized Educational Video(s) Outcome: Adequate for Discharge   Problem: Activity: Goal: Capacity to carry out activities will improve Outcome: Adequate for Discharge   Problem: Cardiac: Goal: Ability to achieve and maintain adequate cardiopulmonary perfusion will improve Outcome: Adequate for Discharge

## 2022-07-08 NOTE — Progress Notes (Signed)
Nsg Discharge Note  Admit Date:  07/01/2022 Discharge date: 07/08/2022   [name] to be D/C'd Home per MD order.  AVS completed.  Copy for chart, and copy for patient signed, and dated. Removed IV-CDI. Reviewed d/c paperwork with patient and wife. TOC medicines sent from pharmacy. Answered all questions. Wheeled stable patient and belongings to main entrance where he was picked up by his wife to d/c to home.  Discharge Medication: Allergies as of 07/08/2022       Reactions   Fentanyl Shortness Of Breath   Meloxicam Other (See Comments)   Leg pain   Rosuvastatin Other (See Comments)   Elevated liver enzymes   Wound Dressing Adhesive    Tape causing abrasions on his skin    Ciprofloxacin Rash   Penicillins Rash   Has patient had a PCN reaction causing immediate rash, facial/tongue/throat swelling, SOB or lightheadedness with hypotension: No Has patient had a PCN reaction causing severe rash involving mucus membranes or skin necrosis: No Has patient had a PCN reaction that required hospitalization: No Has patient had a PCN reaction occurring within the last 10 years: No If all of the above answers are "NO", then may proceed with Cephalosporin use. Other reaction(s): rash        Medication List     STOP taking these medications    spironolactone 100 MG tablet Commonly known as: ALDACTONE   sulfamethoxazole-trimethoprim 800-160 MG tablet Commonly known as: BACTRIM DS       TAKE these medications    Aquaphor Adv Therapy Healing Oint Place 1 Application into left nostril 2 (two) times daily.   busPIRone 15 MG tablet Commonly known as: BUSPAR Take 7.5-15 mg by mouth 2 (two) times daily. Take 1/2 tablet (7.5 mg) in the morning and Take 1 tablet (15 mg) at supper   cefdinir 300 MG capsule Commonly known as: OMNICEF Take 1 capsule (300 mg total) by mouth daily.   clonazePAM 0.5 MG tablet Commonly known as: KLONOPIN Take 0.5 mg by mouth at bedtime.   colesevelam  625 MG tablet Commonly known as: WELCHOL Take 625 mg by mouth daily with breakfast.   escitalopram 5 MG tablet Commonly known as: LEXAPRO Take 5 mg by mouth daily.   ezetimibe 10 MG tablet Commonly known as: ZETIA Take 10 mg by mouth daily.   folic acid 1 MG tablet Commonly known as: FOLVITE Take 1 mg by mouth at bedtime.   furosemide 20 MG tablet Commonly known as: LASIX Take 1 tablet (20 mg total) by mouth 2 (two) times daily. What changed:  medication strength how much to take when to take this   hydrocortisone 10 MG tablet Commonly known as: CORTEF Take 1 tablet (10 mg total) by mouth 2 (two) times daily.   lactulose 10 GM/15ML solution Commonly known as: CHRONULAC Take 15 mLs (10 g total) by mouth 2 (two) times daily as needed for mild constipation (for bowel movments 2-3 per day). What changed: additional instructions   levETIRAcetam 500 MG 24 hr tablet Commonly known as: Keppra XR Take 2 tablets (1,000 mg total) by mouth daily. What changed: when to take this   levothyroxine 88 MCG tablet Commonly known as: SYNTHROID Take 88 mcg by mouth every morning.   midodrine 5 MG tablet Commonly known as: PROAMATINE Take 3 tablets (15 mg total) by mouth 3 (three) times daily with meals.   ondansetron 4 MG disintegrating tablet Commonly known as: Zofran ODT Take 1 tablet (4 mg total)  by mouth every 8 (eight) hours as needed for nausea or vomiting. What changed: reasons to take this   pantoprazole 20 MG tablet Commonly known as: Protonix Take 1 tablet (20 mg total) by mouth daily.   rifaximin 550 MG Tabs tablet Commonly known as: XIFAXAN Take 1 tablet (550 mg total) by mouth 2 (two) times daily.   SALINE MIST 0.65 % nasal spray Generic drug: sodium chloride Place 1 spray into the nose 3 (three) times daily.   thiamine 100 MG tablet Commonly known as: VITAMIN B1 Take 1 tablet (100 mg total) by mouth daily.   traMADol 50 MG tablet Commonly known as:  ULTRAM Take 50 mg by mouth every 6 (six) hours as needed for moderate pain or severe pain.   zinc sulfate 220 (50 Zn) MG capsule Take by mouth.        Discharge Assessment: Vitals:   07/08/22 0444 07/08/22 0802  BP: (!) 102/51 (!) 109/55  Pulse: (!) 59 63  Resp: 18 17  Temp: 98.6 F (37 C) 97.8 F (36.6 C)  SpO2: 99% 100%   Skin clean, dry and intact without evidence of skin break down, no evidence of skin tears noted. IV catheter discontinued intact. Site without signs and symptoms of complications - no redness or edema noted at insertion site, patient denies c/o pain - only slight tenderness at site.  Dressing with slight pressure applied.  D/c Instructions-Education: Discharge instructions given to patient/family with verbalized understanding. D/c education completed with patient/family including follow up instructions, medication list, d/c activities limitations if indicated, with other d/c instructions as indicated by MD - patient able to verbalize understanding, all questions fully answered. Patient instructed to return to ED, call 911, or call MD for any changes in condition.  Patient escorted via [facility], and D/C home via private auto.  Santa Lighter, RN 07/08/2022 12:31 PM

## 2022-07-08 NOTE — Progress Notes (Signed)
Mobility Specialist - Progress Note   07/08/22 0900  Mobility  Activity Ambulated with assistance in hallway  Level of Assistance Standby assist, set-up cues, supervision of patient - no hands on  Assistive Device Cane  Distance Ambulated (ft) 1100 ft  Activity Response Tolerated well  Mobility Referral Yes  $Mobility charge 1 Mobility    Pt received in bed agreeable to mobility. Left in bed w/ call bell in reach and all needs met.   Tyro Specialist Please contact via SecureChat or Rehab office at [phone]

## 2022-07-08 NOTE — Discharge Summary (Addendum)
Physician Discharge Summary  Andrew Patton WGN:[id number] DOB: Dec 25, 1960 DOA: 07/01/2022  PCP: [name], MD  Admit date: 07/01/2022 Discharge date: 07/08/2022  Admitted From: Home Disposition:  Home  Recommendations for Outpatient Follow-up:  Follow up with PCP in 1-2 weeks Please obtain BMP/CBC in one week Follow-up with hematologist at [facility] next week  Home Health:no Equipment/Devices:None  Discharge Condition:Stable CODE STATUS:Full Diet recommendation: Heart Healthy   Brief/Interim Summary:  61 y.o. male past medical history of recurrent decompensated hepatic cirrhosis secondary to alcohol abuse, SBP, recurrent ascites with weekly paracentesis, chronic hyponatremia seizure disorder sent from [facility] for worsening hyponatremia and acute kidney injury patient has been recently treated in the hospital for SBP and acute kidney injury discharged home on prophylactic antibiotics started having abdominal distention and lower extremity edema has gained 3 pounds in the last few days he is also been having abdominal pain and unsteady gait.  Labs in the ED showed acute kidney injury of 1.8 (previous lab 1.2) sodium 120 potassium 3.9 bicarb 20.  When he was discharged from the hospital he weighed around 68 kg on admission 72.3 kg.   Discharge Diagnoses:  Principal Problem:   Hepatorenal syndrome (HCC) Active Problems:   AKI (acute kidney injury) (HCC)   Chronic hyponatremia   Refractory ascites   Decompensated hepatic cirrhosis (HCC)   Encephalopathy, hepatic (HCC)   Penicillin allergy   Ciprofloxacin adverse reaction   Umbilical hernia without obstruction and without gangrene  Acute on chronic hypervolemic hyponatremia: Likely due to hepatorenal syndrome. Renal was consulted he was started on midodrine octreotide and albumin. His sodium improved slowly on the day of discharge he was 129. Cortisol was checked which was low cosyntropin test was positive. He was also sent on  hydrocortisone twice a day.  Acute kidney injury/pararenal syndrome: Nephrology was consulted started him on midodrine octreotide and albumin. His kidney function improved. Nephrology recommended to be discharged on midodrine Lasix as an outpatient. To be made in a week.  Hypokalemia: It was repleted orally now improved he will continue his Aldactone at a lower dose at home.  Adrenal insufficiency: Cortisol level was low cosyntropin test was positive he was started on hydrocortisone he was to continue twice a day.  Abdominal plain/decompensated hepatic cirrhosis: IR was consulted for paracentesis which was done 3 L removed. He was started empirically on Rocephin for SBP which she tolerated well. He will continue Omnicef as an outpatient, for SBP prophylaxis. Continue lactulose and Rifaximin.  Seizure disorder: Continue Keppra.  Anxiety/depression: Continue BuSpar and clonidine.  Discharge Instructions  Discharge Instructions     Diet - low sodium heart healthy   Complete by: As directed    Increase activity slowly   Complete by: As directed       Allergies as of 07/08/2022       Reactions   Fentanyl Shortness Of Breath   Meloxicam Other (See Comments)   Leg pain   Rosuvastatin Other (See Comments)   Elevated liver enzymes   Wound Dressing Adhesive    Tape causing abrasions on his skin    Ciprofloxacin Rash   Penicillins Rash   Has patient had a PCN reaction causing immediate rash, facial/tongue/throat swelling, SOB or lightheadedness with hypotension: No Has patient had a PCN reaction causing severe rash involving mucus membranes or skin necrosis: No Has patient had a PCN reaction that required hospitalization: No Has patient had a PCN reaction occurring within the last 10 years: No If all of the  above answers are "NO", then may proceed with Cephalosporin use. Other reaction(s): rash        Medication List     STOP taking these medications     spironolactone 100 MG tablet Commonly known as: ALDACTONE   sulfamethoxazole-trimethoprim 800-160 MG tablet Commonly known as: BACTRIM DS       TAKE these medications    Aquaphor Adv Therapy Healing Oint Place 1 Application into left nostril 2 (two) times daily.   busPIRone 15 MG tablet Commonly known as: BUSPAR Take 7.5-15 mg by mouth 2 (two) times daily. Take 1/2 tablet (7.5 mg) in the morning and Take 1 tablet (15 mg) at supper   cefdinir 300 MG capsule Commonly known as: OMNICEF Take 1 capsule (300 mg total) by mouth daily.   clonazePAM 0.5 MG tablet Commonly known as: KLONOPIN Take 0.5 mg by mouth at bedtime.   colesevelam 625 MG tablet Commonly known as: WELCHOL Take 625 mg by mouth daily with breakfast.   escitalopram 5 MG tablet Commonly known as: LEXAPRO Take 5 mg by mouth daily.   ezetimibe 10 MG tablet Commonly known as: ZETIA Take 10 mg by mouth daily.   folic acid 1 MG tablet Commonly known as: FOLVITE Take 1 mg by mouth at bedtime.   furosemide 20 MG tablet Commonly known as: LASIX Take 1 tablet (20 mg total) by mouth 2 (two) times daily. What changed:  medication strength how much to take when to take this   hydrocortisone 10 MG tablet Commonly known as: CORTEF Take 1 tablet (10 mg total) by mouth 2 (two) times daily.   lactulose 10 GM/15ML solution Commonly known as: CHRONULAC Take 15 mLs (10 g total) by mouth 2 (two) times daily as needed for mild constipation (for bowel movments 2-3 per day). What changed: additional instructions   levETIRAcetam 500 MG 24 hr tablet Commonly known as: Keppra XR Take 2 tablets (1,000 mg total) by mouth daily. What changed: when to take this   levothyroxine 88 MCG tablet Commonly known as: SYNTHROID Take 88 mcg by mouth every morning.   midodrine 5 MG tablet Commonly known as: PROAMATINE Take 3 tablets (15 mg total) by mouth 3 (three) times daily with meals.   ondansetron 4 MG disintegrating  tablet Commonly known as: Zofran ODT Take 1 tablet (4 mg total) by mouth every 8 (eight) hours as needed for nausea or vomiting. What changed: reasons to take this   pantoprazole 20 MG tablet Commonly known as: Protonix Take 1 tablet (20 mg total) by mouth daily.   rifaximin 550 MG Tabs tablet Commonly known as: XIFAXAN Take 1 tablet (550 mg total) by mouth 2 (two) times daily.   SALINE MIST 0.65 % nasal spray Generic drug: sodium chloride Place 1 spray into the nose 3 (three) times daily.   thiamine 100 MG tablet Commonly known as: VITAMIN B1 Take 1 tablet (100 mg total) by mouth daily.   traMADol 50 MG tablet Commonly known as: ULTRAM Take 50 mg by mouth every 6 (six) hours as needed for moderate pain or severe pain.   zinc sulfate 220 (50 Zn) MG capsule Take by mouth.        Allergies  Allergen Reactions   Fentanyl Shortness Of Breath   Meloxicam Other (See Comments)    Leg pain   Rosuvastatin Other (See Comments)    Elevated liver enzymes   Wound Dressing Adhesive     Tape causing abrasions on his skin  Ciprofloxacin Rash   Penicillins Rash    Has patient had a PCN reaction causing immediate rash, facial/tongue/throat swelling, SOB or lightheadedness with hypotension: No Has patient had a PCN reaction causing severe rash involving mucus membranes or skin necrosis: No Has patient had a PCN reaction that required hospitalization: No Has patient had a PCN reaction occurring within the last 10 years: No If all of the above answers are "NO", then may proceed with Cephalosporin use. Other reaction(s): rash    Consultations: Gastroenterology Nephrology   Procedures/Studies: IR Paracentesis  Result Date: 07/02/2022 INDICATION: Patient with history of alcoholic cirrhosis with recurrent ascites. Request is for therapeutic and diagnostic paracentesis EXAM: ULTRASOUND GUIDED THERAPEUTIC AND DIAGNOSTIC PARACENTESIS MEDICATIONS: Lidocaine 1% 10 mL COMPLICATIONS:  None immediate. PROCEDURE: Informed written consent was obtained from the patient after a discussion of the risks, benefits and alternatives to treatment. A timeout was performed prior to the initiation of the procedure. Initial ultrasound scanning demonstrates a large amount of ascites within the left lower abdominal quadrant. The left lower abdomen was prepped and draped in the usual sterile fashion. 1% lidocaine was used for local anesthesia. Following this, a 19 gauge, 7-cm, Yueh catheter was introduced. An ultrasound image was saved for documentation purposes. The paracentesis was performed. The catheter was removed and a dressing was applied. The patient tolerated the procedure well without immediate post procedural complication. FINDINGS: A total of approximately 3 L of cloudy yellow fluid was removed. Samples were sent to the laboratory as requested by the clinical team. IMPRESSION: Successful ultrasound-guided therapeutic and diagnostic paracentesis yielding 3 L liters of peritoneal fluid. Read by: Anders Grant, NP PLAN: The patient has previously been formally evaluated by the [facility] Interventional Radiology Portal Hypertension [facility] and is being actively followed for potential future intervention. Electronically Signed   By: Marliss Coots M.D.   On: 07/02/2022 15:33   IR Paracentesis  Result Date: 06/25/2022 INDICATION: Alcoholic cirrhosis with recurrent ascites EXAM: ULTRASOUND GUIDED PARACENTESIS MEDICATIONS: 10 cc 1% lidocaine COMPLICATIONS: None immediate. PROCEDURE: Informed written consent was obtained from the patient after a discussion of the risks, benefits and alternatives to treatment. A timeout was performed prior to the initiation of the procedure. Initial ultrasound scanning demonstrates a large amount of ascites within the right lower abdominal quadrant. The right lower abdomen was prepped and draped in the usual sterile fashion. 1% lidocaine was used for local anesthesia.  Following this, a 19 gauge, 7-cm, Yueh catheter was introduced. An ultrasound image was saved for documentation purposes. The paracentesis was performed. The patient accidentally self-removed catheter after approximately 1 L fluid had been removed. Following this, ultrasound scanning demonstrated a safe window in the left lower abdominal quadrant. The left lower abdomen was prepped and draped in the usual sterile fashion. 1% lidocaine was used for local anesthesia. A 19 gauge 7 cm Yueh catheter was introduced. The paracentesis was performed. The catheter was removed and a dressing was applied. The patient tolerated the procedure well without immediate post procedural complication. FINDINGS: A total of approximately 5 L of clear yellow fluid was removed. Ordering provider did not request laboratory samples IMPRESSION: Successful ultrasound-guided paracentesis yielding 5 liters of peritoneal fluid. PLAN: The patient has previously been formally evaluated by the [facility] Interventional Radiology Portal Hypertension Clinic and is being actively followed for potential future intervention. Read by: Mina Marble, PA-C Electronically Signed   By: Richarda Overlie M.D.   On: 06/25/2022 18:56   DG Shoulder Left  Result Date: 06/18/2022 CLINICAL  DATA:  Fall.  Left shoulder pain. EXAM: LEFT SHOULDER - 2+ VIEW COMPARISON:  None Available. FINDINGS: No acute fracture or dislocation is identified. No significant arthropathy is evident. The soft tissues are unremarkable. IMPRESSION: Negative. Electronically Signed   By: Sebastian Ache M.D.   On: 06/18/2022 14:45   CT HEAD WO CONTRAST ( )  Result Date: 06/18/2022 CLINICAL DATA:  Head trauma, fall, fell twice yesterday uncertain if struck head EXAM: CT HEAD WITHOUT CONTRAST CT CERVICAL SPINE WITHOUT CONTRAST TECHNIQUE: Multidetector CT imaging of the head and cervical spine was performed following the standard protocol without intravenous contrast. Multiplanar CT image  reconstructions of the cervical spine were also generated. RADIATION DOSE REDUCTION: This exam was performed according to the departmental dose-optimization program which includes automated exposure control, adjustment of the mA and/or kV according to patient size and/or use of iterative reconstruction technique. COMPARISON:  CT head 02/08/2020 FINDINGS: CT HEAD FINDINGS Brain: Mild generalized atrophy. Normal ventricular morphology. No midline shift or mass effect. 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 No intracranial hemorrhage or evidence of acute infarction. No extra-axial fluid collections. Calcified mass RIGHT vertex question calcified meningioma versus dural calcification 8 mm diameter unchanged. Vascular: No hyperdense vessels. Skull: Calvaria intact. Sclerotic focus within clivus unchanged since 10/08/2008. Sinuses/Orbits: Clear Other: N/A CT CERVICAL SPINE FINDINGS Alignment: Normal Skull base and vertebrae: Osseous demineralization. Skull base intact. Vertebral body heights maintained. No fracture, subluxation, or bone destruction. Disc space narrowing and endplate spur formation C5-C6 and C6-C7. Scattered multilevel facet degenerative changes. Soft tissues and spinal canal: Prevertebral soft tissues normal thickness. 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 No acute intracranial abnormalities. Stable 8 mm dural calcification versus meningioma RIGHT vertex. Degenerative disc and facet disease changes of the cervical spine. No acute cervical spine abnormalities. Electronically Signed   By: Ulyses Southward M.D.   On: 06/18/2022 12:16   CT CERVICAL SPINE WO CONTRAST  Result Date: 06/18/2022 CLINICAL DATA:  Head trauma, fall, fell twice yesterday uncertain if struck head EXAM: CT HEAD WITHOUT CONTRAST CT CERVICAL SPINE WITHOUT CONTRAST TECHNIQUE: Multidetector CT imaging of the head and  cervical spine was performed following the standard protocol without intravenous contrast. Multiplanar CT image reconstructions of the cervical spine were also generated. RADIATION DOSE REDUCTION: This exam was performed according to the departmental dose-optimization program which includes automated exposure control, adjustment of the mA and/or kV according to patient size and/or use of iterative reconstruction technique. COMPARISON:  CT head 02/08/2020 FINDINGS: CT HEAD FINDINGS Brain: Mild generalized atrophy. Normal ventricular morphology. No midline shift or mass effect. 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 No intracranial hemorrhage or evidence of acute infarction. No extra-axial fluid collections. Calcified mass RIGHT vertex question calcified meningioma versus dural calcification 8 mm diameter unchanged. Vascular: No hyperdense vessels. Skull: Calvaria intact. Sclerotic focus within clivus unchanged since 10/08/2008. Sinuses/Orbits: Clear Other: N/A CT CERVICAL SPINE FINDINGS Alignment: Normal Skull base and vertebrae: Osseous demineralization. Skull base intact. Vertebral body heights maintained. No fracture, subluxation, or bone destruction. Disc space narrowing and endplate spur formation C5-C6 and C6-C7. Scattered multilevel facet degenerative changes. Soft tissues and spinal canal: Prevertebral soft tissues normal thickness. 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 No acute intracranial abnormalities. Stable 8 mm dural calcification versus meningioma RIGHT vertex. Degenerative disc and facet disease changes of the  cervical spine. No acute cervical spine abnormalities. Electronically Signed   By: Ulyses Southward M.D.   On: 06/18/2022 12:16   DG Pelvis 1-2 Views  Result Date: 06/18/2022 CLINICAL DATA:  [name] down today, weakness EXAM: PELVIS - 1-2 VIEW COMPARISON:  CT abdomen and pelvis  05/26/2022, 03/07/2019 FINDINGS: Osseous mineralization normal. Hip joint space narrowing greater on LEFT. SI joints preserved. Sclerotic focus at RIGHT femoral neck unchanged since 03/07/2019, question enchondroma. No fracture, dislocation, or bone destruction. IMPRESSION: No acute osseous abnormalities. Electronically Signed   By: Ulyses Southward M.D.   On: 06/18/2022 12:06   DG Chest 1 View  Result Date: 06/18/2022 CLINICAL DATA:  [name] today, history of multiple falls, seizures, weakness EXAM: CHEST  1 VIEW COMPARISON:  08/05/2021 FINDINGS: Upper normal heart size. Mediastinal contours and pulmonary vascularity normal. Atherosclerotic calcification aorta. Questionable nodular density versus nipple shadow RIGHT lung base. Lungs otherwise clear. No pulmonary infiltrate, pleural effusion, or pneumothorax. No acute osseous findings. IMPRESSION: Question nodular density versus nipple shadow at RIGHT lung base; repeat PA chest radiograph with nipple markers recommended to exclude pulmonary nodule. Aortic Atherosclerosis (ICD10-I70.0). Electronically Signed   By: Ulyses Southward M.D.   On: 06/18/2022 12:05   DG Elbow Complete Left  Result Date: 06/18/2022 CLINICAL DATA:  [name] today, history of multiple falls, seizures, laceration posterior LEFT elbow, weakness EXAM: LEFT ELBOW - COMPLETE 3+ VIEW COMPARISON:  None Available. FINDINGS: Osseous mineralization normal. Joint spaces preserved. No acute fracture, dislocation, or bone destruction. No joint effusion. Soft tissue swelling dorsal to the elbow/olecranon with foci of soft tissue gas consistent with history of laceration. No radiopaque foreign body. IMPRESSION: No acute osseous abnormalities. Electronically Signed   By: Ulyses Southward M.D.   On: 06/18/2022 12:03   DG Knee 2 Views Left  Result Date: 06/18/2022 CLINICAL DATA:  [name] today, history of multiple falls, seizures, laceration anterior LEFT knee, weakness EXAM: LEFT KNEE - 1-2 VIEW COMPARISON:  04/29/2014  FINDINGS: Osseous mineralization normal. Joint spaces preserved. No acute fracture, dislocation, or bone destruction. No significant joint effusion or radiopaque foreign bodies. IMPRESSION: No acute abnormalities. Electronically Signed   By: Ulyses Southward M.D.   On: 06/18/2022 12:02   IR Paracentesis  Result Date: 06/18/2022 INDICATION: Alcoholic cirrhosis with recurrent ascites EXAM: ULTRASOUND GUIDED RIGHT PARACENTESIS MEDICATIONS: None. COMPLICATIONS: None immediate. PROCEDURE: Informed written consent was obtained from the patient after a discussion of the risks, benefits and alternatives to treatment. A timeout was performed prior to the initiation of the procedure. Initial ultrasound scanning demonstrates a large amount of ascites within the right lower abdominal quadrant. The right lower abdomen was prepped and draped in the usual sterile fashion. 1% lidocaine was used for local anesthesia. Following this, a 19 gauge, 7-cm, Yueh catheter was introduced. An ultrasound image was saved for documentation purposes. The paracentesis was performed. The catheter was removed and a dressing was applied. The patient tolerated the procedure well without immediate post procedural complication. FINDINGS: A total of approximately 4L of minimally cloudy ascitic fluid was removed. Samples were sent to the laboratory. IMPRESSION: Successful ultrasound-guided paracentesis yielding 4L liters of peritoneal fluid. PLAN: The patient has previously been formally evaluated by the [facility] Interventional Radiology Portal Hypertension Clinic and is being actively followed for potential future intervention. Electronically Signed   By: Malachy Moan M.D.   On: 06/18/2022 10:52   IR Paracentesis  Result Date: 06/11/2022 INDICATION: Patient with history of alcoholic cirrhosis with recurrent ascites. Request is for therapeutic  paracentesis with 6 L maximum EXAM: ULTRASOUND GUIDED THERAPEUTIC PARACENTESIS MEDICATIONS: Lidocaine  1% 10 mL COMPLICATIONS: None immediate. PROCEDURE: Informed written consent was obtained from the patient after a discussion of the risks, benefits and alternatives to treatment. A timeout was performed prior to the initiation of the procedure. Initial ultrasound scanning demonstrates a large amount of ascites within the right lower abdominal quadrant. The right lower abdomen was prepped and draped in the usual sterile fashion. 1% lidocaine was used for local anesthesia. Following this, a 19 gauge, 10-cm, Yueh catheter was introduced. An ultrasound image was saved for documentation purposes. The paracentesis was performed. The catheter was removed and a dressing was applied. The patient tolerated the procedure well without immediate post procedural complication. FINDINGS: A total of approximately 6 liters of straw-colored fluid was removed. IMPRESSION: Successful ultrasound-guided therapeutic paracentesis yielding 6 liters of peritoneal fluid. PLAN: The patient has previously been formally evaluated by the [facility] Interventional Radiology Portal Hypertension Clinic and is being actively followed for potential future intervention. The patient is currently being worked up with [facility] for possible tips and transplant Electronically Signed   By: Marliss Coots M.D.   On: 06/11/2022 12:19   (Echo, Carotid, EGD, Colonoscopy, ERCP)    Subjective: Relates he feels much better.  Discharge Exam: Vitals:   07/08/22 0444 07/08/22 0802  BP: (!) 102/51 (!) 109/55  Pulse: (!) 59 63  Resp: 18 17  Temp: 98.6 F (37 C) 97.8 F (36.6 C)  SpO2: 99% 100%   Vitals:   07/07/22 2107 07/07/22 2156 07/08/22 0444 07/08/22 0802  BP: (!) 95/51 (!) 103/52 (!) 102/51 (!) 109/55  Pulse: (!) 53 (!) 55 (!) 59 63  Resp: 16  18 17   Temp: 98.2 F (36.8 C)  98.6 F (37 C) 97.8 F (36.6 C)  TempSrc: Oral  Oral Oral  SpO2: 99%  99% 100%  Weight:      Height:        General: Pt is alert, awake, not in  acute distress Cardiovascular: RRR, S1/S2 +, no rubs, no gallops Respiratory: CTA bilaterally, no wheezing, no rhonchi Abdominal: Soft, NT, ND, bowel sounds + Extremities: no edema, no cyanosis    The results of significant diagnostics from this hospitalization (including imaging, microbiology, ancillary and laboratory) are listed below for reference.     Microbiology: Recent Results (from the past 240 hour(s))  Culture, body fluid w Gram Stain-bottle     Status: None   Collection Time: 07/02/22 12:11 PM   Specimen: Peritoneal Washings  Result Value Ref Range Status   Specimen Description PERITONEAL  Final   Special Requests NONE  Final   Culture   Final    NO GROWTH 5 DAYS Performed at [facility], [address] [place], [place]    Report Status 07/07/2022 FINAL  Final  Gram stain     Status: None   Collection Time: 07/02/22 12:11 PM   Specimen: Peritoneal Washings  Result Value Ref Range Status   Specimen Description PERITONEAL  Final   Special Requests NONE  Final   Gram Stain   Final    NO ORGANISMS SEEN WBC PRESENT, PREDOMINANTLY MONONUCLEAR CYTOSPIN SMEAR Performed at [facility], [address] [place], [place]    Report Status 07/02/2022 FINAL  Final     Labs: BNP (last 3 results) No results for input(s): "BNP" in the last 8760 hours. Basic Metabolic Panel: Recent Labs  Lab 07/03/22  0159 07/03/22 1203 07/04/22 0137 07/05/22 0335 07/06/22 0345 07/07/22 0812  NA 126* 124* 126* 126* 129* 133*  K 3.6 3.6 3.4* 3.2* 3.1* 3.7  CL 95* 94* 99 97* 99 102  CO2 21* 18* 21* 19* 21* 21*  GLUCOSE 122* 143* 114* 109* 127* 105*  BUN 26* 26* 25* 24* 27* 21  CREATININE 1.54* 1.41* 1.35* 1.42* 1.51* 1.21  CALCIUM 8.7* 8.2* 8.0* 8.0* 8.1* 8.3*  PHOS 3.2  --  3.0 3.2  --   --    Liver Function Tests: Recent Labs  Lab 07/01/22 1148 07/02/22 0320 07/03/22 0159 07/04/22 0137 07/05/22 0335  AST 46* 33  --   --   --   ALT 25 22   --   --   --   ALKPHOS 92 71  --   --   --   BILITOT 2.3* 2.5*  --   --   --   PROT 6.3* 5.4*  --   --   --   ALBUMIN 3.6 3.3* 3.6 3.6 3.3*   No results for input(s): "LIPASE", "AMYLASE" in the last 168 hours. Recent Labs  Lab 07/01/22 1400  AMMONIA 55*   CBC: Recent Labs  Lab 07/01/22 1148  WBC 9.0  NEUTROABS 6.3  HGB 10.7*  HCT RESULTS UNAVAILABLE DUE TO INTERFERING SUBSTANCE  MCV RESULTS UNAVAILABLE DUE TO INTERFERING SUBSTANCE  PLT 286   Cardiac Enzymes: No results for input(s): "CKTOTAL", "CKMB", "CKMBINDEX", "TROPONINI" in the last 168 hours. BNP: Invalid input(s): "POCBNP" CBG: No results for input(s): "GLUCAP" in the last 168 hours. D-Dimer No results for input(s): "DDIMER" in the last 72 hours. Hgb A1c No results for input(s): "HGBA1C" in the last 72 hours. Lipid Profile No results for input(s): "CHOL", "HDL", "LDLCALC", "TRIG", "CHOLHDL", "LDLDIRECT" in the last 72 hours. Thyroid function studies No results for input(s): "TSH", "T4TOTAL", "T3FREE", "THYROIDAB" in the last 72 hours.  Invalid input(s): "FREET3" Anemia work up No results for input(s): "VITAMINB12", "FOLATE", "FERRITIN", "TIBC", "IRON", "RETICCTPCT" in the last 72 hours. Urinalysis    Component Value Date/Time   COLORURINE YELLOW 05/28/2022 1534   APPEARANCEUR CLEAR 05/28/2022 1534   APPEARANCEUR Clear 11/13/2020 1333   LABSPEC 1.009 05/28/2022 1534   PHURINE 5.0 05/28/2022 1534   GLUCOSEU NEGATIVE 05/28/2022 1534   HGBUR NEGATIVE 05/28/2022 1534   BILIRUBINUR NEGATIVE 05/28/2022 1534   BILIRUBINUR Negative 11/13/2020 1333   KETONESUR NEGATIVE 05/28/2022 1534   PROTEINUR NEGATIVE 05/28/2022 1534   NITRITE NEGATIVE 05/28/2022 1534   LEUKOCYTESUR NEGATIVE 05/28/2022 1534   Sepsis Labs Recent Labs  Lab 07/01/22 1148  WBC 9.0   Microbiology Recent Results (from the past 240 hour(s))  Culture, body fluid w Gram Stain-bottle     Status: None   Collection Time: 07/02/22 12:11 PM    Specimen: Peritoneal Washings  Result Value Ref Range Status   Specimen Description PERITONEAL  Final   Special Requests NONE  Final   Culture   Final    NO GROWTH 5 DAYS Performed at [facility], [address], [place], [place] [address]    Report Status 07/07/2022 FINAL  Final  Gram stain     Status: None   Collection Time: 07/02/22 12:11 PM   Specimen: Peritoneal Washings  Result Value Ref Range Status   Specimen Description PERITONEAL  Final   Special Requests NONE  Final   Gram Stain   Final    NO ORGANISMS SEEN WBC PRESENT, PREDOMINANTLY MONONUCLEAR CYTOSPIN SMEAR Performed  at [facility], [address], [place], [place] [address]    Report Status 07/02/2022 FINAL  Final      SIGNED:   Marinda Elk, MD  [facility] 07/08/2022, 9:16 AM Pager   If 7PM-7AM, please contact night-coverage [email or web address] Password [email or web address]

## 2022-07-08 NOTE — Plan of Care (Signed)

## 2022-07-09 ENCOUNTER — Ambulatory Visit (HOSPITAL_COMMUNITY)
Admission: RE | Admit: 2022-07-09 | Discharge: 2022-07-09 | Disposition: A | Discharge status: Home / Self Care | Payer: Medicare Other | Source: Ambulatory Visit | Attending: Gastroenterology | Admitting: Gastroenterology

## 2022-07-09 DIAGNOSIS — R188 Other ascites: Secondary | ICD-10-CM | POA: Insufficient documentation

## 2022-07-09 DIAGNOSIS — K7469 Other cirrhosis of liver: Secondary | ICD-10-CM | POA: Diagnosis not present

## 2022-07-09 HISTORY — PX: IR PARACENTESIS: IMG2679

## 2022-07-09 MED ORDER — LIDOCAINE HCL 1 % IJ SOLN
INTRAMUSCULAR | Status: AC
  Filled 2022-07-09: qty 20

## 2022-07-09 NOTE — Procedures (Signed)
PROCEDURE SUMMARY:  Successful ultrasound guided paracentesis from the right lower quadrant.  Yielded 3 of straw fluid. 3 L max No immediate complications.  The patient tolerated the procedure well.   EBL < 27mL  Patient is being scheduled for transplant evaluation at [facility].

## 2022-07-12 DIAGNOSIS — Z01810 Encounter for preprocedural cardiovascular examination: Secondary | ICD-10-CM | POA: Diagnosis not present

## 2022-07-12 DIAGNOSIS — R0609 Other forms of dyspnea: Secondary | ICD-10-CM | POA: Diagnosis not present

## 2022-07-12 DIAGNOSIS — I10 Essential (primary) hypertension: Secondary | ICD-10-CM | POA: Diagnosis not present

## 2022-07-13 DIAGNOSIS — K7460 Unspecified cirrhosis of liver: Secondary | ICD-10-CM | POA: Diagnosis not present

## 2022-07-13 DIAGNOSIS — Z23 Encounter for immunization: Secondary | ICD-10-CM | POA: Diagnosis not present

## 2022-07-13 DIAGNOSIS — I81 Portal vein thrombosis: Secondary | ICD-10-CM | POA: Diagnosis not present

## 2022-07-13 DIAGNOSIS — E2740 Unspecified adrenocortical insufficiency: Secondary | ICD-10-CM | POA: Diagnosis not present

## 2022-07-13 DIAGNOSIS — K7469 Other cirrhosis of liver: Secondary | ICD-10-CM | POA: Diagnosis not present

## 2022-07-13 DIAGNOSIS — Z87891 Personal history of nicotine dependence: Secondary | ICD-10-CM | POA: Diagnosis not present

## 2022-07-13 DIAGNOSIS — Z7682 Awaiting organ transplant status: Secondary | ICD-10-CM | POA: Diagnosis not present

## 2022-07-13 DIAGNOSIS — Z01818 Encounter for other preprocedural examination: Secondary | ICD-10-CM | POA: Diagnosis not present

## 2022-07-14 DIAGNOSIS — R1030 Lower abdominal pain, unspecified: Secondary | ICD-10-CM | POA: Diagnosis not present

## 2022-07-14 DIAGNOSIS — R1033 Periumbilical pain: Secondary | ICD-10-CM | POA: Diagnosis not present

## 2022-07-14 DIAGNOSIS — Z8619 Personal history of other infectious and parasitic diseases: Secondary | ICD-10-CM | POA: Diagnosis not present

## 2022-07-14 DIAGNOSIS — R5381 Other malaise: Secondary | ICD-10-CM | POA: Diagnosis not present

## 2022-07-14 DIAGNOSIS — K7682 Hepatic encephalopathy: Secondary | ICD-10-CM | POA: Diagnosis not present

## 2022-07-14 DIAGNOSIS — Z7189 Other specified counseling: Secondary | ICD-10-CM | POA: Diagnosis not present

## 2022-07-14 DIAGNOSIS — R5383 Other fatigue: Secondary | ICD-10-CM | POA: Diagnosis not present

## 2022-07-14 DIAGNOSIS — Z01818 Encounter for other preprocedural examination: Secondary | ICD-10-CM | POA: Diagnosis not present

## 2022-07-14 DIAGNOSIS — K7460 Unspecified cirrhosis of liver: Secondary | ICD-10-CM | POA: Diagnosis not present

## 2022-07-15 DIAGNOSIS — R188 Other ascites: Secondary | ICD-10-CM | POA: Diagnosis not present

## 2022-07-15 DIAGNOSIS — K7469 Other cirrhosis of liver: Secondary | ICD-10-CM | POA: Diagnosis not present

## 2022-07-15 DIAGNOSIS — K7031 Alcoholic cirrhosis of liver with ascites: Secondary | ICD-10-CM | POA: Diagnosis not present

## 2022-07-15 DIAGNOSIS — R0602 Shortness of breath: Secondary | ICD-10-CM | POA: Diagnosis not present

## 2022-07-15 DIAGNOSIS — Z01810 Encounter for preprocedural cardiovascular examination: Secondary | ICD-10-CM | POA: Diagnosis not present

## 2022-07-19 ENCOUNTER — Other Ambulatory Visit (HOSPITAL_COMMUNITY): Payer: Self-pay | Admitting: Gastroenterology

## 2022-07-19 DIAGNOSIS — R188 Other ascites: Secondary | ICD-10-CM

## 2022-07-20 ENCOUNTER — Ambulatory Visit (HOSPITAL_COMMUNITY)
Admission: RE | Admit: 2022-07-20 | Discharge: 2022-07-20 | Disposition: A | Discharge status: Home / Self Care | Payer: Medicare Other | Source: Ambulatory Visit | Attending: Gastroenterology | Admitting: Gastroenterology

## 2022-07-20 DIAGNOSIS — Z1212 Encounter for screening for malignant neoplasm of rectum: Secondary | ICD-10-CM | POA: Diagnosis not present

## 2022-07-20 DIAGNOSIS — R188 Other ascites: Secondary | ICD-10-CM | POA: Diagnosis present

## 2022-07-20 DIAGNOSIS — Z23 Encounter for immunization: Secondary | ICD-10-CM | POA: Diagnosis not present

## 2022-07-20 HISTORY — PX: IR PARACENTESIS: IMG2679

## 2022-07-20 MED ORDER — LIDOCAINE HCL 1 % IJ SOLN
INTRAMUSCULAR | Status: AC
  Administered 2022-07-20: 10 mL
  Filled 2022-07-20: qty 20

## 2022-07-20 MED ORDER — ALBUMIN HUMAN 25 % IV SOLN
INTRAVENOUS | Status: AC
  Administered 2022-07-20: 37.5 g via INTRAVENOUS
  Filled 2022-07-20: qty 150

## 2022-07-20 MED ORDER — ALBUMIN HUMAN 25 % IV SOLN: 37.5000 g | Freq: Once | INTRAVENOUS | Status: AC

## 2022-07-20 NOTE — Procedures (Signed)
PROCEDURE SUMMARY:  Successful US guided paracentesis from RLQ.  Yielded 6.5L of ascitic fluid.  No immediate complications.  Pt tolerated well.   Specimen not sent for labs.  EBL < 68mL  Brigham Cobbins PA-C 07/20/2022 10:32 AM

## 2022-07-21 DIAGNOSIS — H40053 Ocular hypertension, bilateral: Secondary | ICD-10-CM | POA: Diagnosis not present

## 2022-07-27 ENCOUNTER — Telehealth: Payer: Self-pay | Admitting: Pharmacy Technician

## 2022-07-27 ENCOUNTER — Ambulatory Visit (HOSPITAL_COMMUNITY)
Admission: RE | Admit: 2022-07-27 | Discharge: 2022-07-27 | Disposition: A | Discharge status: Home / Self Care | Payer: Medicare Other | Source: Ambulatory Visit | Attending: Gastroenterology | Admitting: Gastroenterology

## 2022-07-27 DIAGNOSIS — R188 Other ascites: Secondary | ICD-10-CM | POA: Diagnosis present

## 2022-07-27 HISTORY — PX: IR PARACENTESIS: IMG2679

## 2022-07-27 MED ORDER — LIDOCAINE HCL 1 % IJ SOLN
INTRAMUSCULAR | Status: AC
  Administered 2022-07-27: 10 mL
  Filled 2022-07-27: qty 20

## 2022-07-27 MED ORDER — ALBUMIN HUMAN 25 % IV SOLN
INTRAVENOUS | Status: AC
  Administered 2022-07-27: 37.5 g
  Filled 2022-07-27: qty 150

## 2022-07-27 NOTE — Procedures (Signed)
PROCEDURE SUMMARY:  Successful ultrasound guided paracentesis from the right lower  quadrant.  Yielded 6 L of clear yellow fluid.  No immediate complications.  The patient tolerated the procedure well.    EBL < 78mL  Patient is currently being worked up at OSH for possible liver transplant. The patient has previously been formally evaluated by the [facility] and is being actively followed for potential future intervention.

## 2022-07-27 NOTE — Telephone Encounter (Signed)
Patient Advocate Encounter  Received notification from [facility] that prior authorization for XIFAXAN 550MG  is required.   PA submitted on 12.26.23 Key [id number] Status is pending    12.28.23, CPhT Patient Advocate Phone: [phone]

## 2022-07-28 ENCOUNTER — Other Ambulatory Visit (HOSPITAL_COMMUNITY): Payer: Self-pay

## 2022-07-28 NOTE — Telephone Encounter (Signed)
Patient Advocate Encounter  Prior Authorization for XIFAXAN 550MG  has been approved.    PA#  Effective dates: 12.26.23 through 12.24.24

## 2022-08-03 ENCOUNTER — Ambulatory Visit (HOSPITAL_COMMUNITY)
Admission: RE | Admit: 2022-08-03 | Discharge: 2022-08-03 | Disposition: A | Discharge status: Home / Self Care | Payer: Medicare PPO | Source: Ambulatory Visit | Attending: Gastroenterology | Admitting: Gastroenterology

## 2022-08-03 ENCOUNTER — Other Ambulatory Visit: Payer: Self-pay

## 2022-08-03 DIAGNOSIS — R188 Other ascites: Secondary | ICD-10-CM | POA: Diagnosis not present

## 2022-08-03 HISTORY — PX: IR PARACENTESIS: IMG2679

## 2022-08-03 MED ORDER — ALBUMIN HUMAN 25 % IV SOLN
INTRAVENOUS | Status: AC
  Administered 2022-08-03: 50 g via INTRAVENOUS
  Filled 2022-08-03: qty 50

## 2022-08-03 MED ORDER — ALBUMIN HUMAN 25 % IV SOLN
50.0000 g | Freq: Once | INTRAVENOUS | Status: AC
  Filled 2022-08-03: qty 200

## 2022-08-03 MED ORDER — LIDOCAINE HCL 1 % IJ SOLN
INTRAMUSCULAR | Status: AC
  Administered 2022-08-03: 4 mL via INTRADERMAL
  Filled 2022-08-03: qty 20

## 2022-08-04 ENCOUNTER — Other Ambulatory Visit: Payer: Self-pay

## 2022-08-05 ENCOUNTER — Other Ambulatory Visit (HOSPITAL_COMMUNITY): Payer: Self-pay

## 2022-08-06 ENCOUNTER — Telehealth: Payer: Self-pay

## 2022-08-06 NOTE — Telephone Encounter (Signed)
Called patient to schedule EGD at [facility] per Dr. Bryan Lemma. Patient was seen by Dr. Bryan Lemma on August, 23. Patient also needs Previsit appointment as well.

## 2022-08-09 ENCOUNTER — Ambulatory Visit (HOSPITAL_COMMUNITY)
Admission: RE | Admit: 2022-08-09 | Discharge: 2022-08-09 | Disposition: A | Discharge status: Home / Self Care | Payer: Medicare PPO | Source: Ambulatory Visit | Attending: Gastroenterology | Admitting: Gastroenterology

## 2022-08-09 DIAGNOSIS — R188 Other ascites: Secondary | ICD-10-CM | POA: Insufficient documentation

## 2022-08-09 HISTORY — PX: IR PARACENTESIS: IMG2679

## 2022-08-09 MED ORDER — ALBUMIN HUMAN 25 % IV SOLN
37.5000 g | Freq: Once | INTRAVENOUS | Status: AC
  Administered 2022-08-09: 37.5 g via INTRAVENOUS

## 2022-08-09 MED ORDER — ALBUMIN HUMAN 25 % IV SOLN
INTRAVENOUS | Status: AC
  Filled 2022-08-09: qty 150

## 2022-08-09 MED ORDER — LIDOCAINE HCL 1 % IJ SOLN
INTRAMUSCULAR | Status: AC
  Administered 2022-08-09: 10 mL
  Filled 2022-08-09: qty 20

## 2022-08-09 NOTE — Procedures (Signed)
PROCEDURE SUMMARY:  Successful ultrasound guided paracentesis from the right lower quadrant.  Yielded 6 Liters of straw colored fluid.  No immediate complications.  The patient tolerated the procedure well.   EBL < none  The patient has previously been formally evaluated by the [facility] and is being actively followed for potential future intervention. Patient is currently being worked up at [facility] for possible transplant

## 2022-08-11 ENCOUNTER — Telehealth: Payer: Self-pay | Admitting: Gastroenterology

## 2022-08-11 NOTE — Telephone Encounter (Signed)
Scheduled EGD with Andrew Patton (patient's wife) on 09/21/22, and Previsit on 09/02/22.  Dr Bryan Lemma, Andrew Patton is really concerned with [name]'s fluid build up. Stated he had paracentesis done on Monday, 1/8 with 6 L fluid was removed, but he is already swollen by Wednesday. Next is scheduled on 1/15. Stated he always has at least 4 L fluid still remaining even after removal. She is requesting if you give permission to schedule one more paracentesis before coming Monday.   And Stated if you would suggest to increase his Lasix. He is taking Lasix 20 MG BID now.   He has follow up appointment coming up with [facility], Dr. Edison Pace on 08/24/22.

## 2022-08-11 NOTE — Telephone Encounter (Signed)
Olegario Messier spoke with pt's wife today. See 1/5 telephone encounter. Concerns routed to Dr. Bryan Lemma.

## 2022-08-11 NOTE — Telephone Encounter (Signed)
Inbound call from patients wife Hassan Rowan requesting a call back to discuss if patient can have paracentesis more often. Please advise.

## 2022-08-12 ENCOUNTER — Other Ambulatory Visit: Payer: Self-pay | Admitting: Gastroenterology

## 2022-08-12 ENCOUNTER — Other Ambulatory Visit: Payer: Self-pay

## 2022-08-12 DIAGNOSIS — R188 Other ascites: Secondary | ICD-10-CM

## 2022-08-12 NOTE — Telephone Encounter (Signed)
Yes, please set up for paracentesis tomorrow with IR if possible with 6L max whiel awaiting response from Dr. Melony Overly office. Please send cell count, gram stain.

## 2022-08-12 NOTE — Telephone Encounter (Signed)
Pt scheduled for paracentesis tomorrow 1/12 at 1 pm. Pt's wife stated pt will need 1/15 appointment as well. Let pt's wife know that on Monday she can call us back and we can assess how pt is doing at that time.

## 2022-08-12 NOTE — Telephone Encounter (Signed)
Spoke with pt's wife and gave her recommendations. Pt's wife feels like pt is reaccumulating fluid more rapidly. Does pt need labs with paracentesis? Pt's wife states she has already left message for pt's coordinator at the transplant clinic and coordinator will relay message to Dr. Edison Pace.

## 2022-08-12 NOTE — Telephone Encounter (Signed)
He has had issues in the past with taking too much ascites fluid at one time, and that has been the reason to limit paracentesis to 6L at a time. If he is reaccumulating more rapidly, then yes, please schedule for repeat paracentesis this week if possible. However, I would also contact Dr. Edison Pace at [facility] to get her recommendations on this, and to also discuss diuretic dosing. He has had issues with renal intolerance to increased diuretic doses in the past, and I recommend against having too many physicians managing the same issue, as this can lead to conflicting opinions/recommendations at times. Therefore, since he is now established in the [facility], I advise that being the primary source cirrhosis management since Dr. Edison Pace is the subject matter expert in this field.

## 2022-08-13 ENCOUNTER — Other Ambulatory Visit: Payer: Self-pay | Admitting: Gastroenterology

## 2022-08-13 ENCOUNTER — Ambulatory Visit (HOSPITAL_COMMUNITY)
Admission: RE | Admit: 2022-08-13 | Discharge: 2022-08-13 | Disposition: A | Discharge status: Home / Self Care | Payer: Medicare PPO | Source: Ambulatory Visit | Attending: Gastroenterology | Admitting: Gastroenterology

## 2022-08-13 DIAGNOSIS — K2970 Gastritis, unspecified, without bleeding: Secondary | ICD-10-CM

## 2022-08-13 DIAGNOSIS — R188 Other ascites: Secondary | ICD-10-CM | POA: Insufficient documentation

## 2022-08-13 HISTORY — PX: IR PARACENTESIS: IMG2679

## 2022-08-13 MED ORDER — ALBUMIN HUMAN 25 % IV SOLN: 3705.0000 g | Freq: Once | INTRAVENOUS | Status: DC

## 2022-08-13 MED ORDER — LIDOCAINE HCL 1 % IJ SOLN
INTRAMUSCULAR | Status: AC
  Administered 2022-08-13: 8 mL
  Filled 2022-08-13: qty 20

## 2022-08-13 MED ORDER — ALBUMIN HUMAN 25 % IV SOLN
37.5000 g | Freq: Once | INTRAVENOUS | Status: AC
  Administered 2022-08-13: 37.5 g via INTRAVENOUS
  Filled 2022-08-13: qty 150

## 2022-08-13 MED ORDER — ALBUMIN HUMAN 25 % IV SOLN
INTRAVENOUS | Status: AC
  Filled 2022-08-13: qty 150

## 2022-08-13 NOTE — Procedures (Signed)
PROCEDURE SUMMARY:  Successful ultrasound guided paracentesis from the right lower quadrant.  Yielded 6 of hazy yellow fluid.  No immediate complications.  The patient tolerated the procedure well.   EBL < 104mL  The patient has previously been formally evaluated by the [name] [facility] and is being actively followed for potential future intervention. And is currently being consulted for a possible transplant at [facility].

## 2022-08-16 ENCOUNTER — Other Ambulatory Visit (HOSPITAL_COMMUNITY): Payer: Medicare Other

## 2022-08-16 ENCOUNTER — Other Ambulatory Visit: Payer: Self-pay

## 2022-08-16 DIAGNOSIS — K7460 Unspecified cirrhosis of liver: Secondary | ICD-10-CM

## 2022-08-16 DIAGNOSIS — R188 Other ascites: Secondary | ICD-10-CM

## 2022-08-16 NOTE — Telephone Encounter (Signed)
Spoke with pt's wife and gave her recommendations. Patient is scheduled for paracentesis tomorrow and again on Friday 1/19. Pt's wife verbalized understanding.

## 2022-08-16 NOTE — Telephone Encounter (Signed)
Inbound call from patient wife stating patient is  still having complications and would like to be rescheduled for paracentesis.  Please advise. Thank you

## 2022-08-16 NOTE — Telephone Encounter (Signed)
Since he had another 6L removed last Friday, and we have concerns about escalating his diuretic dosing, please schedule for repeat paracentesis tomorrow and again on 1/19.  Very much agree that Dr. Edison Pace should be kept in the loop on this.  It is possible that he could require TIPS for refractory ascites, but I would allow Dr. Edison Pace to make that decision since he is now part of the Transplant Hepatology Clinic.

## 2022-08-16 NOTE — Telephone Encounter (Addendum)
Spoke with pt's wife and wife stated that she was able to schedule the patient's weekly paracentesis for tomorrow but states he will need one for Friday 1/19. Wife states that pt seems to be accumulating fluid more quickly and his stomach gets very swollen and pushes on his hernia. Pt also has trouble breathing when his stomach gets so full. Pt's wife also reports she has noticed that pt is coughing more as well. Asked pt's wife if she had heard any response from Dr. Edison Pace and wife stated she had not but would contact Dr. Edison Pace again today.

## 2022-08-17 ENCOUNTER — Ambulatory Visit (HOSPITAL_COMMUNITY)
Admission: RE | Admit: 2022-08-17 | Discharge: 2022-08-17 | Disposition: A | Discharge status: Home / Self Care | Payer: Medicare PPO | Source: Ambulatory Visit | Attending: Gastroenterology | Admitting: Gastroenterology

## 2022-08-17 DIAGNOSIS — K7460 Unspecified cirrhosis of liver: Secondary | ICD-10-CM | POA: Insufficient documentation

## 2022-08-17 DIAGNOSIS — R188 Other ascites: Secondary | ICD-10-CM | POA: Diagnosis not present

## 2022-08-17 HISTORY — PX: IR PARACENTESIS: IMG2679

## 2022-08-17 LAB — BODY FLUID CELL COUNT WITH DIFFERENTIAL
Eos, Fluid: 0 %
Lymphs, Fluid: 60 %
Monocyte-Macrophage-Serous Fluid: 33 % — ABNORMAL LOW (ref 50–90)
Neutrophil Count, Fluid: 7 % (ref 0–25)
Total Nucleated Cell Count, Fluid: 15 cu mm (ref 0–1000)

## 2022-08-17 LAB — COMPREHENSIVE METABOLIC PANEL
ALT: 32 U/L (ref 0–44)
AST: 39 U/L (ref 15–41)
Albumin: 4.3 g/dL (ref 3.5–5.0)
Alkaline Phosphatase: 107 U/L (ref 38–126)
Anion gap: 9 (ref 5–15)
BUN: 26 mg/dL — ABNORMAL HIGH (ref 8–23)
CO2: 22 mmol/L (ref 22–32)
Calcium: 8.9 mg/dL (ref 8.9–10.3)
Chloride: 98 mmol/L (ref 98–111)
Creatinine, Ser: 1.02 mg/dL (ref 0.61–1.24)
GFR, Estimated: >60 mL/min (ref >60)
Glucose, Bld: 112 mg/dL — ABNORMAL HIGH (ref 70–99)
Potassium: 3.6 mmol/L (ref 3.5–5.1)
Sodium: 129 mmol/L — ABNORMAL LOW (ref 135–145)
Total Bilirubin: 2 mg/dL — ABNORMAL HIGH (ref 0.3–1.2)
Total Protein: 6.5 g/dL (ref 6.5–8.1)

## 2022-08-17 LAB — GRAM STAIN

## 2022-08-17 LAB — PROTIME-INR
INR: 1.5 — ABNORMAL HIGH (ref 0.8–1.2)
Prothrombin Time: 18 seconds — ABNORMAL HIGH (ref 11.4–15.2)

## 2022-08-17 MED ORDER — ALBUMIN HUMAN 25 % IV SOLN
50.0000 g | Freq: Once | INTRAVENOUS | Status: AC
  Administered 2022-08-17: 50 g via INTRAVENOUS
  Filled 2022-08-17: qty 200

## 2022-08-17 MED ORDER — LIDOCAINE HCL 1 % IJ SOLN
INTRAMUSCULAR | Status: AC
  Filled 2022-08-17: qty 20

## 2022-08-17 MED ORDER — ALBUMIN HUMAN 25 % IV SOLN
INTRAVENOUS | Status: AC
  Filled 2022-08-17: qty 200

## 2022-08-17 NOTE — Procedures (Signed)
PROCEDURE SUMMARY:  Successful ultrasound guided paracentesis from the right lower quadrant.  Yielded 6 L of cloudy yellow fluid.  No immediate complications.  The patient tolerated the procedure well.   Specimen sent for labs.  EBL < 2 mL  The patient has previously been formally evaluated by the [place] [facility] and is being actively followed for potential future intervention. He is working with [facility] for possible transplant versus TIPS.    Soyla Dryer,  [phone] 08/17/2022, 3:15 PM

## 2022-08-18 ENCOUNTER — Telehealth (HOSPITAL_COMMUNITY): Payer: Self-pay | Admitting: Gastroenterology

## 2022-08-18 LAB — PATHOLOGIST SMEAR REVIEW

## 2022-08-18 NOTE — Telephone Encounter (Signed)
08/18/22~Patient & Wife called & stated that patient's Paracentesis Site is leaking Fluid since Procedure on 08/17/22. Patient to return to [facility] (Rad Waiting Rm) per [facility]. MF

## 2022-08-20 ENCOUNTER — Telehealth: Payer: Self-pay | Admitting: Gastroenterology

## 2022-08-20 ENCOUNTER — Encounter: Payer: Self-pay | Admitting: Gastroenterology

## 2022-08-20 ENCOUNTER — Ambulatory Visit (HOSPITAL_COMMUNITY)
Admission: RE | Admit: 2022-08-20 | Discharge: 2022-08-20 | Disposition: A | Discharge status: Home / Self Care | Payer: Medicare PPO | Source: Ambulatory Visit | Attending: Gastroenterology | Admitting: Gastroenterology

## 2022-08-20 DIAGNOSIS — K7460 Unspecified cirrhosis of liver: Secondary | ICD-10-CM | POA: Insufficient documentation

## 2022-08-20 DIAGNOSIS — R188 Other ascites: Secondary | ICD-10-CM | POA: Diagnosis not present

## 2022-08-20 HISTORY — PX: IR PARACENTESIS: IMG2679

## 2022-08-20 LAB — BODY FLUID CELL COUNT WITH DIFFERENTIAL
Eos, Fluid: 0 %
Lymphs, Fluid: 64 %
Monocyte-Macrophage-Serous Fluid: 30 % — ABNORMAL LOW (ref 50–90)
Neutrophil Count, Fluid: 6 % (ref 0–25)
Total Nucleated Cell Count, Fluid: 89 cu mm (ref 0–1000)

## 2022-08-20 MED ORDER — ALBUMIN HUMAN 25 % IV SOLN
50.0000 g | Freq: Once | INTRAVENOUS | Status: AC
  Administered 2022-08-20: 50 g via INTRAVENOUS
  Filled 2022-08-20: qty 200

## 2022-08-20 MED ORDER — ALBUMIN HUMAN 25 % IV SOLN
INTRAVENOUS | Status: AC
  Filled 2022-08-20: qty 200

## 2022-08-20 MED ORDER — LIDOCAINE HCL 1 % IJ SOLN
INTRAMUSCULAR | Status: AC
  Administered 2022-08-20: 6 mL via INTRADERMAL
  Filled 2022-08-20: qty 20

## 2022-08-20 NOTE — Telephone Encounter (Signed)
Spoke with pt's wife. Pt's wife states that pt needs a paracentesis at least twice a week now and wanted to know if she could have a standing order for 2 paracenteses a week. Wife reports that fluid builds up too quickly for just once a week.

## 2022-08-20 NOTE — Telephone Encounter (Signed)
Ok to place orders for paracentesis twice weekly.  I see that he has a call in to his Transplant Hepatologist at [facility] as well with the same questions/concerns.  As before, I am very much recommend following their lead with regards to potentially uptitrating his diuretics and/or consideration of TIPS for refractory ascites.

## 2022-08-20 NOTE — Procedures (Signed)
Ultrasound-guided diagnostic and therapeutic paracentesis performed yielding 6 liters of pale yellow colored fluid.  Fluid was sent to lab for analysis. No immediate complications. EBL is none.

## 2022-08-20 NOTE — Telephone Encounter (Signed)
Patient wife called, stated she was trying to schedule an appointment for Paracentesis for her husband. However, she was told the order had expired and a new order had to be placed. Patient is wondering how many time a week the order is put in for. Please advise.

## 2022-08-23 ENCOUNTER — Other Ambulatory Visit (HOSPITAL_COMMUNITY): Payer: Self-pay | Admitting: Gastroenterology

## 2022-08-23 ENCOUNTER — Ambulatory Visit (HOSPITAL_COMMUNITY)
Admission: RE | Admit: 2022-08-23 | Discharge: 2022-08-23 | Disposition: A | Discharge status: Home / Self Care | Payer: Medicare PPO | Source: Ambulatory Visit | Attending: Gastroenterology | Admitting: Gastroenterology

## 2022-08-23 DIAGNOSIS — K7460 Unspecified cirrhosis of liver: Secondary | ICD-10-CM

## 2022-08-23 DIAGNOSIS — R188 Other ascites: Secondary | ICD-10-CM

## 2022-08-23 HISTORY — PX: IR PARACENTESIS: IMG2679

## 2022-08-23 LAB — BODY FLUID CULTURE W GRAM STAIN
Culture: NO GROWTH
Gram Stain: NONE SEEN

## 2022-08-23 LAB — GRAM STAIN

## 2022-08-23 MED ORDER — LIDOCAINE HCL 1 % IJ SOLN
INTRAMUSCULAR | Status: AC
  Administered 2022-08-23: 10 mL
  Filled 2022-08-23: qty 20

## 2022-08-23 MED ORDER — ALBUMIN HUMAN 25 % IV SOLN
INTRAVENOUS | Status: AC
  Filled 2022-08-23: qty 200

## 2022-08-23 MED ORDER — ALBUMIN HUMAN 25 % IV SOLN
50.0000 g | Freq: Once | INTRAVENOUS | Status: AC
  Administered 2022-08-23: 50 g via INTRAVENOUS
  Filled 2022-08-23: qty 200

## 2022-08-23 NOTE — Telephone Encounter (Signed)
Spoke with pt's wife and let her know Dr. Vivia Ewing message and that I faxed the standing order this morning. Pt's wife verbalized understanding and stated that they are scheduled to see Dr. Edison Pace tomorrow.

## 2022-08-23 NOTE — Telephone Encounter (Signed)
IR standing orders faxed.

## 2022-08-23 NOTE — Procedures (Addendum)
PROCEDURE SUMMARY:  Successful US guided paracentesis from right lateral abdomen.  Yielded 6.0 liters of yellow, milky fluid.  No immediate complications.  Pt tolerated well.   Specimen was sent for labs.  EBL < 41mL   The patient has previously been formally evaluated by the [place] [facility] and is being actively followed for potential future intervention. He is currently being followed at [facility] for possible transplant vs. TIPS procedure.   Docia Barrier PA-C 08/23/2022 1:22 PM

## 2022-08-24 ENCOUNTER — Other Ambulatory Visit (HOSPITAL_COMMUNITY): Payer: Self-pay | Admitting: Gastroenterology

## 2022-08-24 ENCOUNTER — Telehealth: Payer: Self-pay | Admitting: Adult Health

## 2022-08-24 DIAGNOSIS — R188 Other ascites: Secondary | ICD-10-CM

## 2022-08-24 LAB — PATHOLOGIST SMEAR REVIEW

## 2022-08-24 NOTE — Telephone Encounter (Signed)
Spoke with pt's wife and pt's wife stated that they had appt with Dr. Edison Pace and Dr. Edison Pace recommended that paracentesis order have a max removal of 8L instead of 6L. Is it okay to change max removed to 8L?

## 2022-08-24 NOTE — Telephone Encounter (Signed)
Pt's wife called wanting to inform provider that they have met with Dr. Edison Pace @ [facility] and they will soon be on the list for a liver transplant and the pt will be put on Tacrolimus. She states that this medication will be lowering his threshold for his seizures. She states that if there are any questions to please reach out to her.

## 2022-08-24 NOTE — Telephone Encounter (Signed)
Patient's wife is calling they are at [facility] and they spoke with the doctor there so she is calling to ask if you can add to the order that it could take up to 8 L. Please advise

## 2022-08-25 NOTE — Telephone Encounter (Signed)
Dr. Jaynee Eagles,   Your thoughts?  Andrew Patton

## 2022-08-25 NOTE — Telephone Encounter (Signed)
noted 

## 2022-08-25 NOTE — Telephone Encounter (Signed)
Updated order faxed to [phone]

## 2022-08-26 ENCOUNTER — Ambulatory Visit (HOSPITAL_COMMUNITY)
Admission: RE | Admit: 2022-08-26 | Discharge: 2022-08-26 | Disposition: A | Discharge status: Home / Self Care | Payer: Medicare PPO | Source: Ambulatory Visit | Attending: Gastroenterology | Admitting: Gastroenterology

## 2022-08-26 DIAGNOSIS — R188 Other ascites: Secondary | ICD-10-CM | POA: Insufficient documentation

## 2022-08-26 DIAGNOSIS — K7460 Unspecified cirrhosis of liver: Secondary | ICD-10-CM

## 2022-08-26 HISTORY — PX: IR PARACENTESIS: IMG2679

## 2022-08-26 LAB — BODY FLUID CELL COUNT WITH DIFFERENTIAL
Eos, Fluid: 0 %
Lymphs, Fluid: 70 %
Monocyte-Macrophage-Serous Fluid: 16 % — ABNORMAL LOW (ref 50–90)
Neutrophil Count, Fluid: 14 % (ref 0–25)
Total Nucleated Cell Count, Fluid: 84 cu mm (ref 0–1000)

## 2022-08-26 MED ORDER — ALBUMIN HUMAN 25 % IV SOLN
50.0000 g | Freq: Once | INTRAVENOUS | Status: AC
  Administered 2022-08-26: 50 g via INTRAVENOUS

## 2022-08-26 MED ORDER — LIDOCAINE HCL 1 % IJ SOLN
INTRAMUSCULAR | Status: AC
  Administered 2022-08-26: 8 mL
  Filled 2022-08-26: qty 20

## 2022-08-26 MED ORDER — ALBUMIN HUMAN 25 % IV SOLN
INTRAVENOUS | Status: AC
  Filled 2022-08-26: qty 200

## 2022-08-26 NOTE — Telephone Encounter (Signed)
Inbound call from patients wife stating that the order for patiens paracentesis had not been changed. Requesting it be changed to 8 Liters before his next appointment. Please advise.

## 2022-08-26 NOTE — Procedures (Signed)
PROCEDURE SUMMARY:  Successful image-guided paracentesis from the right lower abdomen.  Yielded 6 liters of hazy yellow fluid.  No immediate complications.  EBL = trace. Patient tolerated well.   Specimen was sent for labs.  Please see imaging section of Epic for full dictation.   Lura Em PA-C 08/26/2022 2:39 PM

## 2022-08-26 NOTE — Telephone Encounter (Signed)
I don;t see any interaction between tacrolimus and keppra.  Dr. April Manson are you aware if a patient is going to have a liver transplant if we shoul dincrease his keppra?

## 2022-08-26 NOTE — Telephone Encounter (Addendum)
Called pt's wife and let her know that appt was made before order was faxed so that may be why order still had not been changed. Called interventional radiology and let them know that pt needs max of 8 L removed instead of 6 L.

## 2022-08-26 NOTE — Telephone Encounter (Signed)
Thank you Dr. April Manson!  Jinny Blossom, you see him 2/15 and can discuss with him  Pod 4 please let patient know(did patient initially call about this?) what Dr. April Manson said (below) and our plan that can be discssed with megan at his next appointment thanks   No interaction between Tacrolimus and Keppra but Tacrolimus can lower the seizure threshold. His last Keppra level was borderline low. Once he starts the medication, we should increase his Keppra to 1500 mg XR to give him extra protection.

## 2022-08-26 NOTE — Telephone Encounter (Signed)
No interaction between Tacrolimus and Keppra but Tacrolimus can lower the seizure threshold. His last Keppra level was borderline low. Once he starts the medication, we should increase his Keppra to 1500 mg XR to give him extra protection.

## 2022-08-27 ENCOUNTER — Other Ambulatory Visit (HOSPITAL_COMMUNITY): Payer: Self-pay

## 2022-08-27 LAB — PATHOLOGIST SMEAR REVIEW: Path Review: NEGATIVE

## 2022-08-29 LAB — BODY FLUID CULTURE W GRAM STAIN
Culture: NO GROWTH
Gram Stain: NONE SEEN

## 2022-08-30 ENCOUNTER — Ambulatory Visit (HOSPITAL_COMMUNITY)
Admission: RE | Admit: 2022-08-30 | Discharge: 2022-08-30 | Disposition: A | Discharge status: Home / Self Care | Payer: Medicare PPO | Source: Ambulatory Visit | Attending: Gastroenterology | Admitting: Gastroenterology

## 2022-08-30 ENCOUNTER — Other Ambulatory Visit (HOSPITAL_COMMUNITY): Payer: Self-pay | Admitting: Gastroenterology

## 2022-08-30 ENCOUNTER — Telehealth: Payer: Self-pay | Admitting: Adult Health

## 2022-08-30 DIAGNOSIS — K7460 Unspecified cirrhosis of liver: Secondary | ICD-10-CM

## 2022-08-30 DIAGNOSIS — R188 Other ascites: Secondary | ICD-10-CM | POA: Diagnosis not present

## 2022-08-30 HISTORY — PX: IR PARACENTESIS: IMG2679

## 2022-08-30 LAB — BODY FLUID CELL COUNT WITH DIFFERENTIAL
Eos, Fluid: 0 %
Lymphs, Fluid: 47 %
Monocyte-Macrophage-Serous Fluid: 50 % (ref 50–90)
Neutrophil Count, Fluid: 3 % (ref 0–25)
Total Nucleated Cell Count, Fluid: 120 cu mm (ref 0–1000)

## 2022-08-30 LAB — GRAM STAIN: Gram Stain: NONE SEEN

## 2022-08-30 MED ORDER — ALBUMIN HUMAN 25 % IV SOLN
INTRAVENOUS | Status: AC
  Filled 2022-08-30: qty 200

## 2022-08-30 MED ORDER — ALBUMIN HUMAN 25 % IV SOLN
50.0000 g | Freq: Once | INTRAVENOUS | Status: AC
  Administered 2022-08-30: 50 g via INTRAVENOUS
  Filled 2022-08-30: qty 200

## 2022-08-30 MED ORDER — LIDOCAINE HCL 1 % IJ SOLN
INTRAMUSCULAR | Status: AC
  Filled 2022-08-30: qty 20

## 2022-08-30 NOTE — Telephone Encounter (Signed)
Pt's wife, Levii Hairfield want to make aware pt will be on Tacrolimus after liver transplant. Tacrolimus can lower the threshold for seizures. Would like a call back to discuss for approval medication interaction with his seizure medication.

## 2022-08-30 NOTE — Telephone Encounter (Signed)
There is already a phone note about this.

## 2022-08-30 NOTE — Procedures (Signed)
PROCEDURE SUMMARY:  Successful US guided diagnostic and therapeutic paracentesis from RLQ.  Yielded 7.1 L of cloudy, yellow fluid.  No immediate complications.  Pt tolerated well.   Specimen was sent for labs.  EBL < 1 mL  Tyson Alias, AGNP 08/30/2022 11:44 AM

## 2022-08-30 NOTE — Telephone Encounter (Signed)
I called the pt's wife and discussed the message from Dr April Manson as noted below. The pt's wife verbalized understanding and appreciation. She will update our office on 09/16/22 at the visit and will call sooner if needed. She said [facility] talked about putting one of their neurologists on his treatment team. She also said the patient is not quite on the list yet but she anticipates he will be within the next 2 months. She said he won't start the Tacrolimus until he gets the call that a liver is available.

## 2022-09-01 LAB — PATHOLOGIST SMEAR REVIEW

## 2022-09-02 ENCOUNTER — Other Ambulatory Visit (HOSPITAL_COMMUNITY): Payer: Self-pay

## 2022-09-02 ENCOUNTER — Telehealth: Payer: Self-pay | Admitting: Adult Health

## 2022-09-02 ENCOUNTER — Ambulatory Visit (HOSPITAL_COMMUNITY)
Admission: RE | Admit: 2022-09-02 | Discharge: 2022-09-02 | Disposition: A | Discharge status: Home / Self Care | Payer: Medicare PPO | Source: Ambulatory Visit | Attending: Gastroenterology | Admitting: Gastroenterology

## 2022-09-02 ENCOUNTER — Ambulatory Visit (AMBULATORY_SURGERY_CENTER): Payer: Medicare PPO | Admitting: *Deleted

## 2022-09-02 VITALS — Ht 66.5 in | Wt 150.0 lb

## 2022-09-02 DIAGNOSIS — I8511 Secondary esophageal varices with bleeding: Secondary | ICD-10-CM

## 2022-09-02 DIAGNOSIS — K7031 Alcoholic cirrhosis of liver with ascites: Secondary | ICD-10-CM

## 2022-09-02 DIAGNOSIS — R188 Other ascites: Secondary | ICD-10-CM | POA: Insufficient documentation

## 2022-09-02 DIAGNOSIS — K7460 Unspecified cirrhosis of liver: Secondary | ICD-10-CM | POA: Diagnosis not present

## 2022-09-02 HISTORY — PX: IR PARACENTESIS: IMG2679

## 2022-09-02 LAB — GRAM STAIN: Gram Stain: NONE SEEN

## 2022-09-02 MED ORDER — LIDOCAINE HCL 1 % IJ SOLN
INTRAMUSCULAR | Status: AC
  Filled 2022-09-02: qty 20

## 2022-09-02 MED ORDER — ALBUMIN HUMAN 25 % IV SOLN
INTRAVENOUS | Status: AC
  Filled 2022-09-02: qty 200

## 2022-09-02 MED ORDER — ALBUMIN HUMAN 25 % IV SOLN
50.0000 g | Freq: Once | INTRAVENOUS | Status: AC
  Administered 2022-09-02: 37.5 g via INTRAVENOUS

## 2022-09-02 NOTE — Procedures (Signed)
PROCEDURE SUMMARY:  Successful ultrasound guided paracentesis from the left lower quadrant.  Yielded 7.2 L of hazy yellow fluid.  No immediate complications.  The patient tolerated the procedure well.   Specimen was sent for labs.  EBL < 28mL  The patient has previously been formally evaluated by the [place] [facility] and is being actively followed for potential future intervention. Patient currently being evaluated by [facility] for TIPS vs transplant.  Candiss Norse, PA-C

## 2022-09-02 NOTE — Telephone Encounter (Signed)
I spoke to the transplant surgeon. Since tacrolimus can reduce seizure threshold, she is happy to increase his KeppraXR to 1500mg  at bedtime when the medication is started. thanks

## 2022-09-02 NOTE — Telephone Encounter (Addendum)
Langston Masker Transplant coordinator @ [facility])  has called asking that Dr Mendel Ryder King(pt's transplant Hepatologist) is asking for a call to discuss pt, this is a time sensitive request. [phone] is the Dr's cell phone. Please have Dr Call .

## 2022-09-02 NOTE — Progress Notes (Signed)
No egg or soy allergy known to patient  No issues known to pt with past sedation with any surgeries or procedures Patient denies ever being told they had issues or difficulty with intubation  No FH of Malignant Hyperthermia Pt is not on diet pills Pt is not on  home 02  Pt is not on blood thinners  Pt denies issues with constipation  Pt has paracentesis every 2 weeks On transplant list for liver with [facility]  GI MD and [facility] are in touch per pt Pt is not on dialysis Pt denies any upcoming cardiac testing Pt encouraged to use to use Singlecare or Goodrx to reduce cost  Patient's chart reviewed by Osvaldo Angst CNRA prior to previsit and patient appropriate for the [facility].  Previsit completed and red dot placed by patient's name on their procedure day (on provider's schedule).  . Visit by phone Instructions reviewed with pt and pt states understanding. Instructed to review again prior to procedure. Pt states they will.  Instructions sent by mail and my chart

## 2022-09-02 NOTE — Telephone Encounter (Signed)
ok 

## 2022-09-03 ENCOUNTER — Other Ambulatory Visit: Payer: Self-pay

## 2022-09-03 ENCOUNTER — Other Ambulatory Visit (HOSPITAL_COMMUNITY): Payer: Self-pay

## 2022-09-06 ENCOUNTER — Telehealth: Payer: Self-pay | Admitting: Gastroenterology

## 2022-09-06 ENCOUNTER — Other Ambulatory Visit (HOSPITAL_COMMUNITY): Payer: Self-pay | Admitting: Gastroenterology

## 2022-09-06 ENCOUNTER — Encounter (HOSPITAL_COMMUNITY): Payer: Self-pay | Admitting: Gastroenterology

## 2022-09-06 DIAGNOSIS — K7460 Unspecified cirrhosis of liver: Secondary | ICD-10-CM

## 2022-09-06 DIAGNOSIS — R188 Other ascites: Secondary | ICD-10-CM

## 2022-09-06 NOTE — Telephone Encounter (Signed)
Spoke with pt and pt's wife. Pt's wife stated that radiology did not receive the new order for the paracentesis with max 8L removal. Called radiology scheduling to let them know about new order and new order was refaxed.

## 2022-09-06 NOTE — Telephone Encounter (Signed)
Patient's wife is calling regarding orders for paracentesis, wishing to speak with the nurse. Please advise

## 2022-09-07 ENCOUNTER — Ambulatory Visit (HOSPITAL_COMMUNITY)
Admission: RE | Admit: 2022-09-07 | Discharge: 2022-09-07 | Disposition: A | Discharge status: Home / Self Care | Payer: Medicare PPO | Source: Ambulatory Visit | Attending: Gastroenterology | Admitting: Gastroenterology

## 2022-09-07 DIAGNOSIS — K7460 Unspecified cirrhosis of liver: Secondary | ICD-10-CM | POA: Insufficient documentation

## 2022-09-07 DIAGNOSIS — R188 Other ascites: Secondary | ICD-10-CM | POA: Insufficient documentation

## 2022-09-07 HISTORY — PX: IR PARACENTESIS: IMG2679

## 2022-09-07 LAB — BODY FLUID CELL COUNT WITH DIFFERENTIAL
Eos, Fluid: 0 %
Lymphs, Fluid: 56 %
Monocyte-Macrophage-Serous Fluid: 36 % — ABNORMAL LOW (ref 50–90)
Neutrophil Count, Fluid: 8 % (ref 0–25)
Total Nucleated Cell Count, Fluid: 76 cu mm (ref 0–1000)

## 2022-09-07 LAB — GRAM STAIN

## 2022-09-07 MED ORDER — LIDOCAINE HCL 1 % IJ SOLN
INTRAMUSCULAR | Status: AC
  Filled 2022-09-07: qty 20

## 2022-09-07 MED ORDER — ALBUMIN HUMAN 25 % IV SOLN
50.0000 g | Freq: Once | INTRAVENOUS | Status: AC
  Administered 2022-09-07: 50 g via INTRAVENOUS
  Filled 2022-09-07: qty 200

## 2022-09-07 MED ORDER — ALBUMIN HUMAN 25 % IV SOLN
INTRAVENOUS | Status: AC
  Filled 2022-09-07: qty 200

## 2022-09-07 NOTE — Procedures (Signed)
PROCEDURE SUMMARY:  Successful ultrasound guided diagnostic and therapeutic paracentesis from the RLQ. Yielded 8 L of cloudy, pale yellow fluid.  No immediate complications.  The patient tolerated the procedure well.   Specimen was sent for labs.  EBL < 1 mL  The patient has previously been formally evaluated by the [place] [facility] and is being actively followed for potential future intervention. He is currently being followed at [facility] for possible transplant vs. TIPS procedure.    Narda Rutherford, AGNP-BC 09/07/2022, 10:36 AM

## 2022-09-09 ENCOUNTER — Encounter: Payer: Self-pay | Admitting: Gastroenterology

## 2022-09-09 LAB — PATHOLOGIST SMEAR REVIEW

## 2022-09-09 LAB — CULTURE, BODY FLUID W GRAM STAIN -BOTTLE: Culture: NO GROWTH

## 2022-09-10 ENCOUNTER — Other Ambulatory Visit (HOSPITAL_COMMUNITY): Payer: Self-pay | Admitting: Gastroenterology

## 2022-09-10 ENCOUNTER — Ambulatory Visit (HOSPITAL_COMMUNITY)
Admission: RE | Admit: 2022-09-10 | Discharge: 2022-09-10 | Disposition: A | Discharge status: Home / Self Care | Payer: Medicare PPO | Source: Ambulatory Visit | Attending: Gastroenterology | Admitting: Gastroenterology

## 2022-09-10 DIAGNOSIS — R188 Other ascites: Secondary | ICD-10-CM | POA: Diagnosis not present

## 2022-09-10 DIAGNOSIS — K7460 Unspecified cirrhosis of liver: Secondary | ICD-10-CM | POA: Insufficient documentation

## 2022-09-10 HISTORY — PX: IR PARACENTESIS: IMG2679

## 2022-09-10 LAB — BODY FLUID CELL COUNT WITH DIFFERENTIAL
Eos, Fluid: 0 %
Lymphs, Fluid: 37 %
Monocyte-Macrophage-Serous Fluid: 60 % (ref 50–90)
Neutrophil Count, Fluid: 3 % (ref 0–25)
Total Nucleated Cell Count, Fluid: 98 cu mm (ref 0–1000)

## 2022-09-10 LAB — GRAM STAIN

## 2022-09-10 MED ORDER — ALBUMIN HUMAN 25 % IV SOLN
50.0000 g | Freq: Once | INTRAVENOUS | Status: AC
  Filled 2022-09-10: qty 200

## 2022-09-10 MED ORDER — LIDOCAINE HCL 1 % IJ SOLN
INTRAMUSCULAR | Status: AC
  Administered 2022-09-10: 10 mL
  Filled 2022-09-10: qty 20

## 2022-09-10 MED ORDER — ALBUMIN HUMAN 25 % IV SOLN
INTRAVENOUS | Status: AC
  Administered 2022-09-10: 50 g via INTRAVENOUS
  Filled 2022-09-10: qty 200

## 2022-09-13 LAB — PATHOLOGIST SMEAR REVIEW

## 2022-09-15 ENCOUNTER — Ambulatory Visit (HOSPITAL_COMMUNITY)
Admission: RE | Admit: 2022-09-15 | Discharge: 2022-09-15 | Disposition: A | Discharge status: Home / Self Care | Payer: Medicare PPO | Source: Ambulatory Visit | Attending: Gastroenterology | Admitting: Gastroenterology

## 2022-09-15 DIAGNOSIS — R188 Other ascites: Secondary | ICD-10-CM | POA: Insufficient documentation

## 2022-09-15 DIAGNOSIS — K7460 Unspecified cirrhosis of liver: Secondary | ICD-10-CM | POA: Insufficient documentation

## 2022-09-15 HISTORY — PX: IR PARACENTESIS: IMG2679

## 2022-09-15 LAB — CULTURE, BODY FLUID W GRAM STAIN -BOTTLE: Culture: NO GROWTH

## 2022-09-15 LAB — BODY FLUID CELL COUNT WITH DIFFERENTIAL
Eos, Fluid: 1 %
Lymphs, Fluid: 49 %
Monocyte-Macrophage-Serous Fluid: 45 % — ABNORMAL LOW (ref 50–90)
Neutrophil Count, Fluid: 5 % (ref 0–25)
Total Nucleated Cell Count, Fluid: 82 cu mm (ref 0–1000)

## 2022-09-15 LAB — GRAM STAIN

## 2022-09-15 MED ORDER — ALBUMIN HUMAN 25 % IV SOLN: 50.0000 g | Freq: Once | INTRAVENOUS | Status: AC

## 2022-09-15 MED ORDER — ALBUMIN HUMAN 25 % IV SOLN
INTRAVENOUS | Status: AC
  Administered 2022-09-15: 50 g via INTRAVENOUS
  Filled 2022-09-15: qty 200

## 2022-09-15 MED ORDER — LIDOCAINE HCL 1 % IJ SOLN
INTRAMUSCULAR | Status: AC
  Administered 2022-09-15: 10 mL
  Filled 2022-09-15: qty 20

## 2022-09-15 NOTE — Procedures (Signed)
PROCEDURE SUMMARY:  Successful US guided paracentesis from right abdomen.  Yielded 5.4 L of cloudy yellow fluid.  No immediate complications.  Pt tolerated well.   Specimen sent for labs.  EBL < 20m  The [facility] and is being actively followed for potential future intervention. [facility]  JTheresa DutyPA-C 09/15/2022 12:12 PM

## 2022-09-16 ENCOUNTER — Encounter: Payer: Self-pay | Admitting: Adult Health

## 2022-09-16 ENCOUNTER — Ambulatory Visit (INDEPENDENT_AMBULATORY_CARE_PROVIDER_SITE_OTHER): Payer: Medicare PPO | Admitting: Adult Health

## 2022-09-16 VITALS — BP 106/45 | HR 68 | Ht 66.0 in | Wt 160.0 lb

## 2022-09-16 DIAGNOSIS — R4189 Other symptoms and signs involving cognitive functions and awareness: Secondary | ICD-10-CM

## 2022-09-16 DIAGNOSIS — G40209 Localization-related (focal) (partial) symptomatic epilepsy and epileptic syndromes with complex partial seizures, not intractable, without status epilepticus: Secondary | ICD-10-CM | POA: Diagnosis not present

## 2022-09-16 LAB — CULTURE, BODY FLUID W GRAM STAIN -BOTTLE: Culture: NO GROWTH

## 2022-09-16 NOTE — Patient Instructions (Signed)
Your Plan:  Continue Keppra XR     Thank you for coming to see [name] at [facility]. I hope we have been able to provide you high quality care today.  You may receive a patient satisfaction survey over the next few weeks. We would appreciate your feedback and comments so that we may continue to improve ourselves and the health of our patients.

## 2022-09-16 NOTE — Progress Notes (Signed)
PATIENT: Andrew Patton DOB: 11/25/1960  REASON FOR VISIT: follow up HISTORY FROM: patient PRIMARY NEUROLOGIST: Dr. Jaynee Eagles   Chief Complaint  Patient presents with   Follow-up    Pt in 4      HISTORY OF PRESENT ILLNESS: Today 09/16/22: Andrew Patton is a 62 y.o. male with a history of cognitive deficits and seizures. Returns today for follow-up. Continues to have short term memory issues.   In February had an episode where he blanked and lost track of where he was but as soon as his wife speaks to him and then he typically will get back to normal. Reports that last night he was a little off according to his wife but he had a long day d/t paracentesis.  His wife states that these episodes are not the same as his seizure events.  She feels that it is related to his liver and everything he has going on currently.  Patient will be having a liver transplant due to cirrhosis of the liver.  He will be placed on tacrolimus.  Dr. Jaynee Eagles spoke to his surgeon and Keppra XR will be increased to 1500 mg at bedtime when he is started on this medication.   06/17/22: Andrew Patton is a 62 y.o. male who has been followed in this office for cognitive deficits and seizures. Returns today for follow-up. Wife feels that he has had mild "seizure events." She describes as fogginess (dazed look). Last for seconds- wife states that when she speaks to him he comes out of this. Wife feels that this is due to recent liver issues.   [place] and broke his nose 9/15. Last night he slipped in the tub and hit his head. Not on any blood thinners. Patient wife reports that she has not noticed any residual symptoms after he fell.  Recent infection was in hospital for 2 weeks. Had peritonitis. Now on [place].  Patient's wife feels that he is slowly getting back to his baseline.  Still notices trouble with memory. Wife helps with ADLs due to physical limitations. Wife helps with appointments and medications. Not on any  memory medication.  Wife feels that his cognition worsened after his hospitalization but is slowly getting better.  HISTORY Dec 10, 2021: Patient is here for follow-up after formal neurocognitive testing per Dr. Leota Jacobsen.  I reviewed his notes as below.  Luckily there is no indication of Alzheimer's or Lewy body dementia or other neurodegenerative dementia.  However he does have significant cognitive deficits.  We discussed conservative measures and most of all managing his medical conditions which she is doing very well.  I answered all the questions as to the best of my ability.  He was here with his wife who also provided much information.  Discussed returning to primary care, patient and wife would like to continue following with [name], we will continue to follow him every 6 months for the next year to ensure his stability.   Dr. Sima Matas: Impression/Diagnosis:                     The results of the current neuropsychological evaluation are consistent between the patient's reports and his wife reports about significant cognitive difficulties.  However, the obtained scores were significantly below and worse than the descriptions provided by the patient and his wife.  The patient was so severely impaired during the objective assessment that it would have been very difficult for him to manage even basic ADL  type functions around his house.  I do think that it is difficult to assess effectively areas outside of those primarily related to attentional deficits particularly for auditory and visual encoding and focus execute capacity.  These are so impaired that they had a significant impact on all other cognitive areas assessed.  The patient does clearly have significant cognitive deficits with significant memory and attentional deficits present.  This pattern would be consistent with postconcussion syndrome types of deficits but they are will be on what would be expected typically from a concussive event and  isolation.  As far as diagnostic considerations, the patient presents with a very complicated medical and neuropsychological picture.  The patient has had multiple concussive events going back over 3 decades.  However, the most changes were noted after a significant fall in September 2021.  The patient began having seizure-like events that were consistent with complex partial type seizures.  He has responded very well to [place] as far as reducing these events.  He would have acute confusion, disorientation and balance issues during these times.  While neuro imaging including MRIs did not show any observable abnormalities I suspect that the patient has had some degree of diffuse axonal injury as his cognitive deficits go well beyond those typically seen with lobar white matter injury slowly.  I do, however, think that there are other issues going on that are contributing and worsening his symptoms although some of which we are not going to be able to avoid.  The patient has responded very well to [place] and this is decreased his acute seizure/confusional events.  However, due to his anxiety and depressive symptomatology and sleep difficulties he has continued to take benzo diazepam such as Ambien, clonazepam as well as Keppra for his seizures.  The patient has longstanding sleep disturbance but was not found to have sleep apnea.  During one of his acute events at the winery he had an elevated blood glucose level of 242 which is significant although I suspect that his baseline is relatively high to begin with.  Thyroid issues, chronic insomnia, treatment of his anxiety with benzo diazepam, seizures and the focal injury or location contributing to the seizure events are all likely producing the primary complex of symptoms resulting in his subjective experiences.  The primary area of cognitive deficits around attention and focus execute abilities would suggest primary frontal lobe involvement and his deficits in the  degree of other areas including temporal lobe functioning are very difficult to ascertain as his attentional deficits are so significant profound that almost all other neuropsychological measures are going to be disproportionately and significantly impacted due to his difficulty keeping instructions and directions straight during various tasks.  As far as treatment recommendations it will be important for the patient to continue to work on his depression and anxiety.  He should also continue close monitoring on metabolic issues including his diabetes and thyroid issues and overall liver function etc.  Whenever possible I would suggest he limit his benzo diazepam use if possible as they may exacerbate some of his memory and attentional issues.  While clearly his Keppra use is not the cause of his overall cognitive dysfunction and is essential for managing his seizure activity it may also be playing some role in his cognitive issues but his attentional and memory deficits are so significant that they could not be explained by typical side effects from [place].   I will sit down with the patient and go over all the  results of the current neuropsychological evaluation.  We will address issues related to how to better compensate for his significant attentional issues and encoding deficits and explain how these areas of cognitive functioning are good impact other life details.  Good dietary habits, working on improving his sleep anyway we can and maintaining sustained physical activity will be important overall.  REVIEW OF SYSTEMS: Out of a complete 14 system review of symptoms, the patient complains only of the following symptoms, and all other reviewed systems are negative.  ALLERGIES: Allergies  Allergen Reactions   Fentanyl Shortness Of Breath   Meloxicam Other (See Comments)    Leg pain   Rosuvastatin Other (See Comments)    Elevated liver enzymes   Wound Dressing Adhesive     Tape causing abrasions  on his skin    Ciprofloxacin Rash   Penicillins Rash    Has patient had a PCN reaction causing immediate rash, facial/tongue/throat swelling, SOB or lightheadedness with hypotension: No Has patient had a PCN reaction causing severe rash involving mucus membranes or skin necrosis: No Has patient had a PCN reaction that required hospitalization: No Has patient had a PCN reaction occurring within the last 10 years: No If all of the above answers are "NO", then may proceed with Cephalosporin use. Other reaction(s): rash    HOME MEDICATIONS: Outpatient Medications Prior to Visit  Medication Sig Dispense Refill   busPIRone (BUSPAR) 15 MG tablet Take 7.5-15 mg by mouth 2 (two) times daily. Take 1/2 tablet (7.5 mg) in the morning and Take 1 tablet (15 mg) at supper     cefdinir (OMNICEF) 300 MG capsule Take 1 capsule (300 mg total) by mouth daily. 30 capsule 3   clobetasol cream (TEMOVATE) 0.05 % Apply topically.     clonazePAM (KLONOPIN) 0.5 MG tablet Take 0.5 mg by mouth at bedtime.     colesevelam (WELCHOL) 625 MG tablet Take 625 mg by mouth daily with breakfast.     Emollient (AQUAPHOR ADV THERAPY HEALING) OINT Place 1 Application into left nostril 2 (two) times daily.     escitalopram (LEXAPRO) 5 MG tablet Take 5 mg by mouth daily.     ezetimibe (ZETIA) 10 MG tablet Take 10 mg by mouth daily.     folic acid (FOLVITE) 1 MG tablet Take 1 mg by mouth at bedtime.     furosemide (LASIX) 20 MG tablet Take 1 tablet (20 mg total) by mouth 2 (two) times daily. 60 tablet 3   gabapentin (NEURONTIN) 100 MG capsule Take by mouth.     hydrocortisone (CORTEF) 10 MG tablet Take 1 tablet (10 mg total) by mouth 2 (two) times daily. 60 tablet 1   lactulose (CHRONULAC) 10 GM/15ML solution Take 15 mLs (10 g total) by mouth 2 (two) times daily as needed for mild constipation (for bowel movments 2-3 per day). (Patient taking differently: Take 10 g by mouth 2 (two) times daily as needed for mild constipation (for  bowel movments 2-3 per day). Pt gives usually between 7- 10 mls as needed)     levETIRAcetam (KEPPRA XR) 500 MG 24 hr tablet Take 2 tablets (1,000 mg total) by mouth daily. (Patient taking differently: Take 1,000 mg by mouth at bedtime.) 180 tablet 3   levothyroxine (SYNTHROID) 88 MCG tablet Take 88 mcg by mouth every morning.     midodrine (PROAMATINE) 5 MG tablet Take 3 tablets (15 mg total) by mouth 3 (three) times daily with meals. 270 tablet 3   naloxone (NARCAN)  nasal spray 4 mg/0.1 mL Place into the nose.     ofloxacin (OCUFLOX) 0.3 % ophthalmic solution SMARTSIG:In Eye(s)     ondansetron (ZOFRAN ODT) 4 MG disintegrating tablet Take 1 tablet (4 mg total) by mouth every 8 (eight) hours as needed for nausea or vomiting. (Patient taking differently: Take 4 mg by mouth every 8 (eight) hours as needed for nausea or vomiting (dissolve orally).) 6 tablet 0   pantoprazole (PROTONIX) 20 MG tablet Take 1 tablet (20 mg total) by mouth daily. 90 tablet 1   sodium chloride (SALINE MIST) 0.65 % nasal spray Place 1 spray into the nose 3 (three) times daily.     thiamine 100 MG tablet Take 1 tablet (100 mg total) by mouth daily. 90 tablet 0   traMADol (ULTRAM) 50 MG tablet Take 50 mg by mouth every 6 (six) hours as needed for moderate pain or severe pain.     XIFAXAN 550 MG TABS tablet Take 1 tablet (550 mg total) by mouth 2 (two) times daily. 60 tablet 11   zinc sulfate 220 (50 Zn) MG capsule Take by mouth.     No facility-administered medications prior to visit.    PAST MEDICAL HISTORY: Past Medical History:  Diagnosis Date   Allergy    Anxiety    Arthritis    Ascites    Elevated transaminase level    Fatigue    Fatty liver    Hyperlipidemia    Hypertension    "under control", not on any blood pressure medication, has lost 60 lbs   Hypothyroidism    on synthroid   Inguinal muscle strain    Insomnia    Left knee pain    "long time ago"   Multinodular goiter    Obesity    Pneumonia     Psoriasis    Tinnitus     PAST SURGICAL HISTORY: Past Surgical History:  Procedure Laterality Date   APPENDECTOMY  1976   FOOT SURGERY Bilateral 1975   IR PARACENTESIS  11/25/2021   IR PARACENTESIS  12/02/2021   IR PARACENTESIS  12/17/2021   IR PARACENTESIS  01/05/2022   IR PARACENTESIS  02/04/2022   IR PARACENTESIS  02/15/2022   IR PARACENTESIS  03/03/2022   IR PARACENTESIS  03/10/2022   IR PARACENTESIS  03/16/2022   IR PARACENTESIS  03/23/2022   IR PARACENTESIS  03/29/2022   IR PARACENTESIS  04/06/2022   IR PARACENTESIS  04/20/2022   IR PARACENTESIS  05/04/2022   IR PARACENTESIS  05/20/2022   IR PARACENTESIS  05/31/2022   IR PARACENTESIS  06/11/2022   IR PARACENTESIS  06/18/2022   IR PARACENTESIS  06/25/2022   IR PARACENTESIS  07/02/2022   IR PARACENTESIS  07/09/2022   IR PARACENTESIS  07/20/2022   IR PARACENTESIS  07/27/2022   IR PARACENTESIS  08/03/2022   IR PARACENTESIS  08/09/2022   IR PARACENTESIS  08/13/2022   IR PARACENTESIS  08/17/2022   IR PARACENTESIS  08/20/2022   IR PARACENTESIS  08/23/2022   IR PARACENTESIS  08/26/2022   IR PARACENTESIS  08/30/2022   IR PARACENTESIS  09/02/2022   IR PARACENTESIS  09/07/2022   IR PARACENTESIS  09/10/2022   IR PARACENTESIS  09/15/2022   IR RADIOLOGIST EVAL & MGMT  12/17/2021   IR RADIOLOGIST EVAL & MGMT  12/31/2021   NOSE SURGERY     ROTATOR CUFF REPAIR Left    left   SHOULDER SURGERY Right 12/20/2019   [place]  FAMILY HISTORY: Family History  Problem Relation Age of Onset   Breast cancer Mother    Hyperlipidemia Mother    Other Mother        ? form of parkinson's    Parkinsonism Mother    Bladder Cancer Father    Parkinson's disease Father    Memory loss Maternal Grandmother        in her 16s   Birth defects Maternal Grandfather    Birth defects Paternal Grandmother    Colon cancer Neg Hx    Esophageal cancer Neg Hx    Rectal cancer Neg Hx    Stomach cancer Neg Hx    Colon polyps Neg Hx    Dementia Neg Hx     Alzheimer's disease Neg Hx     SOCIAL HISTORY: Social History   Socioeconomic History   Marital status: Married    Spouse name: Not on file   Number of children: 2   Years of education: Not on file   Highest education level: Associate degree: academic program  Occupational History   Occupation: Press photographer  Tobacco Use   Smoking status: Former    Types: Cigarettes    Quit date: 03/27/1994    Years since quitting: 28.4   Smokeless tobacco: Former    Types: Chew   Tobacco comments:    did chew as a teenager  Vaping Use   Vaping Use: Never used  Substance and Sexual Activity   Alcohol use: Not Currently    Alcohol/week: 2.0 standard drinks of alcohol    Types: 2 Glasses of wine per week   Drug use: No   Sexual activity: Not on file  Other Topics Concern   Not on file  Social History Narrative   Lives at home with wife    Right handed   Caffeine: maybe 1-3 cups/week   Social Determinants of Health   Financial Resource Strain: Not on file  Food Insecurity: No Food Insecurity (06/01/2022)   Hunger Vital Sign    Worried About Running Out of Food in the Last Year: Never true    Ran Out of Food in the Last Year: Never true  Transportation Needs: No Transportation Needs (06/01/2022)   PRAPARE - Hydrologist (Medical): No    Lack of Transportation (Non-Medical): No  Physical Activity: Not on file  Stress: Not on file  Social Connections: Not on file  Intimate Partner Violence: Not At Risk (06/01/2022)   Humiliation, Afraid, Rape, and Kick questionnaire    Fear of Current or Ex-Partner: No    Emotionally Abused: No    Physically Abused: No    Sexually Abused: No      PHYSICAL EXAM  Vitals:   09/16/22 0845  BP: (!) 106/45  Pulse: 68  Weight: 160 lb (72.6 kg)  Height: 5' 6"$  (1.676 m)    Body mass index is 25.82 kg/m.     09/16/2022    8:47 AM 06/17/2022    9:04 AM 09/04/2020    7:57 AM  MMSE - Mini Mental State Exam  Orientation to  time 5 3 4  $ Orientation to Place 5 4 5  $ Registration 3 3 3  $ Attention/ Calculation 1 2 5  $ Recall 3 3 3  $ Language- name 2 objects 2 2 2  $ Language- repeat 1 1 1  $ Language- follow 3 step command 3 3 3  $ Language- read & follow direction 1 1 1  $ Write a sentence 0 1 1  Copy design 0 0 1  Total score 24 23 29     $ Generalized: Well developed, in no acute distress   Neurological examination  Mentation: Alert oriented to time, place, history taking. Follows all commands speech and language fluent Cranial nerve II-XII: Pupils were equal round reactive to light. Extraocular movements were full, visual field were full on confrontational test. Facial sensation and strength were normal.. Head turning and shoulder shrug  were normal and symmetric. Motor: The motor testing reveals 5 over 5 strength of all 4 extremities. Good symmetric motor tone is noted throughout.  Sensory: Sensory testing is intact to soft touch on all 4 extremities. No evidence of extinction is noted.  Coordination: Cerebellar testing reveals good finger-nose-finger and heel-to-shin bilaterally.  Gait and station: Gait is slow and slightly unsteady   DIAGNOSTIC DATA (LABS, IMAGING, TESTING) - I reviewed patient records, labs, notes, testing and imaging myself where available.  Lab Results  Component Value Date   WBC 9.0 07/01/2022   HGB 10.7 (L) 07/01/2022   HCT RESULTS UNAVAILABLE DUE TO INTERFERING SUBSTANCE 07/01/2022   MCV RESULTS UNAVAILABLE DUE TO INTERFERING SUBSTANCE 07/01/2022   PLT 286 07/01/2022      Component Value Date/Time   NA 129 (L) 08/17/2022 1420   NA 138 11/13/2020 1153   K 3.6 08/17/2022 1420   CL 98 08/17/2022 1420   CO2 22 08/17/2022 1420   GLUCOSE 112 (H) 08/17/2022 1420   BUN 26 (H) 08/17/2022 1420   BUN 6 11/13/2020 1153   CREATININE 1.02 08/17/2022 1420   CALCIUM 8.9 08/17/2022 1420   PROT 6.5 08/17/2022 1420   PROT 7.2 11/13/2020 1153   ALBUMIN 4.3 08/17/2022 1420   ALBUMIN 4.4  11/13/2020 1153   AST 39 08/17/2022 1420   ALT 32 08/17/2022 1420   ALKPHOS 107 08/17/2022 1420   BILITOT 2.0 (H) 08/17/2022 1420   BILITOT 2.6 (H) 11/13/2020 1153   GFRNONAA >60 08/17/2022 1420   GFRAA 108 09/04/2020 0908    Lab Results  Component Value Date   HGBA1C 5.2 05/28/2022   Lab Results  Component Value Date   VITAMINB12 1,018 (H) 05/31/2022   Lab Results  Component Value Date   TSH 1.477 07/01/2022      ASSESSMENT AND PLAN 62 y.o. year old male  has a past medical history of Allergy, Anxiety, Arthritis, Ascites, Elevated transaminase level, Fatigue, Fatty liver, Hyperlipidemia, Hypertension, Hypothyroidism, Inguinal muscle strain, Insomnia, Left knee pain, Multinodular goiter, Obesity, Pneumonia, Psoriasis, and Tinnitus. here with :  Cognitive deficits  MMSE stable Continue to monitor   2. Seizures  Continue Keppra XR 1000 mg at bedime.   This will be increased once he is placed on tacrolimus by his surgeon to 1500 mg at bedtime  Follow-up in 6 months or sooner if needed   Ward Givens, MSN, NP-C 09/16/2022, 8:35 AM [facility] [address] [place], [place] [address] [phone]

## 2022-09-18 IMAGING — US IR PARACENTESIS
1 series · 4 of 4 positions shown · non-contrast
Comparison: none

INDICATION: Ascites

[Series 1: ir (person_name)/(person_name) · 4 of 4 slices shown]
[im 1/4]
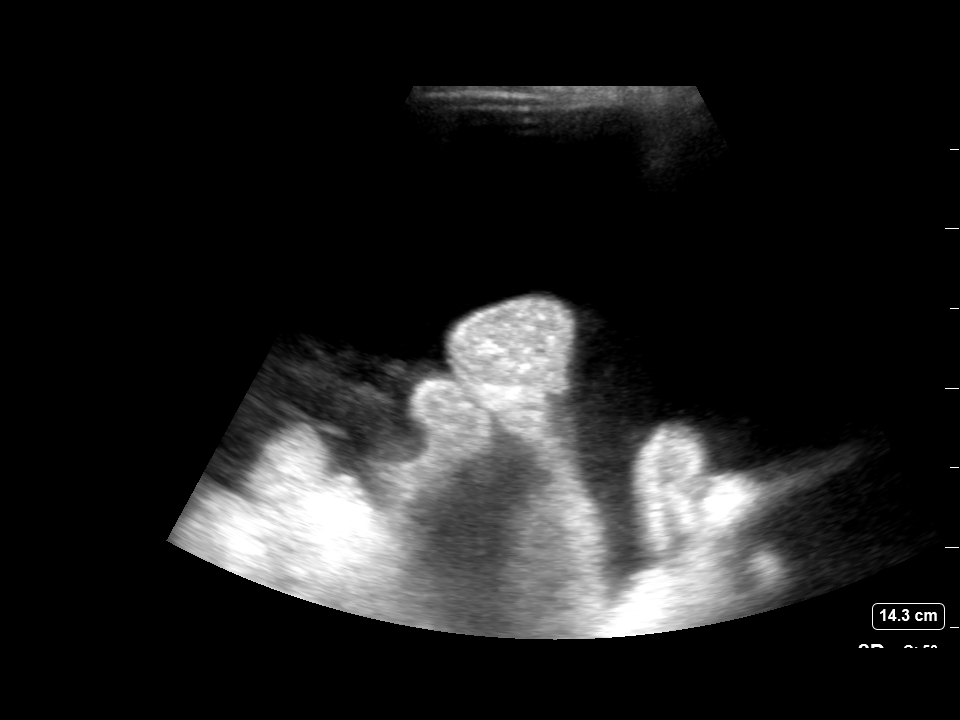
[im 2/4]
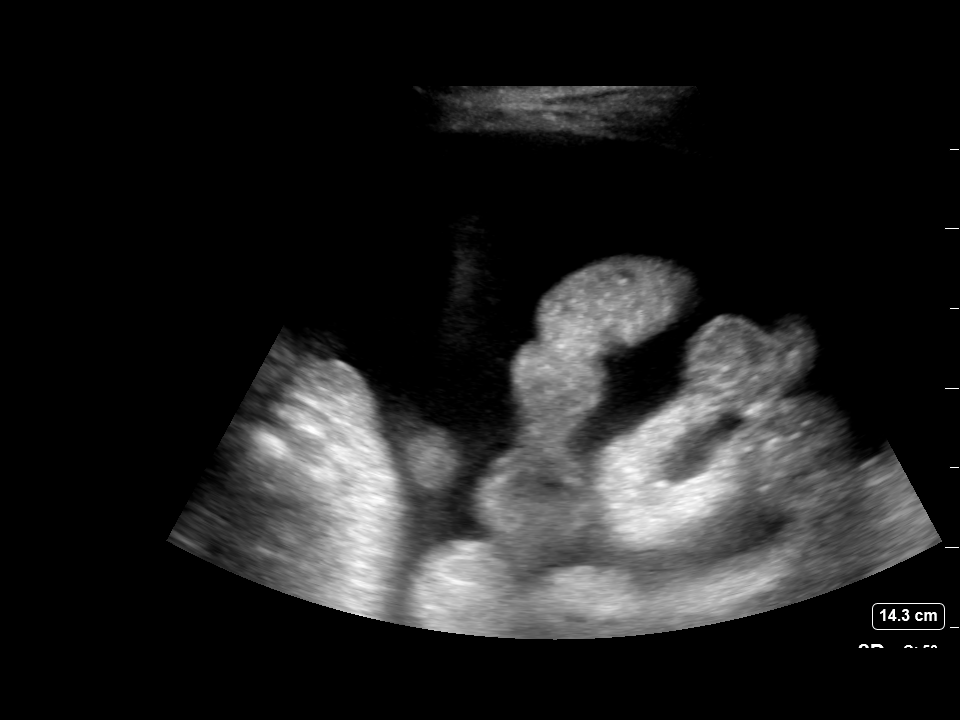
[im 3/4]
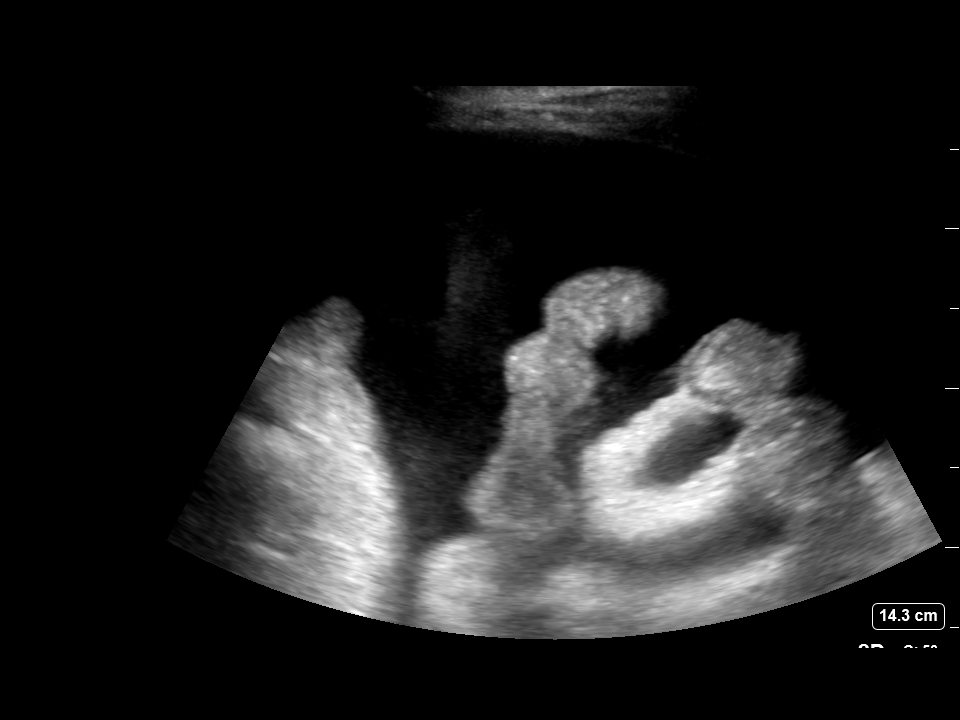
[im 4/4]
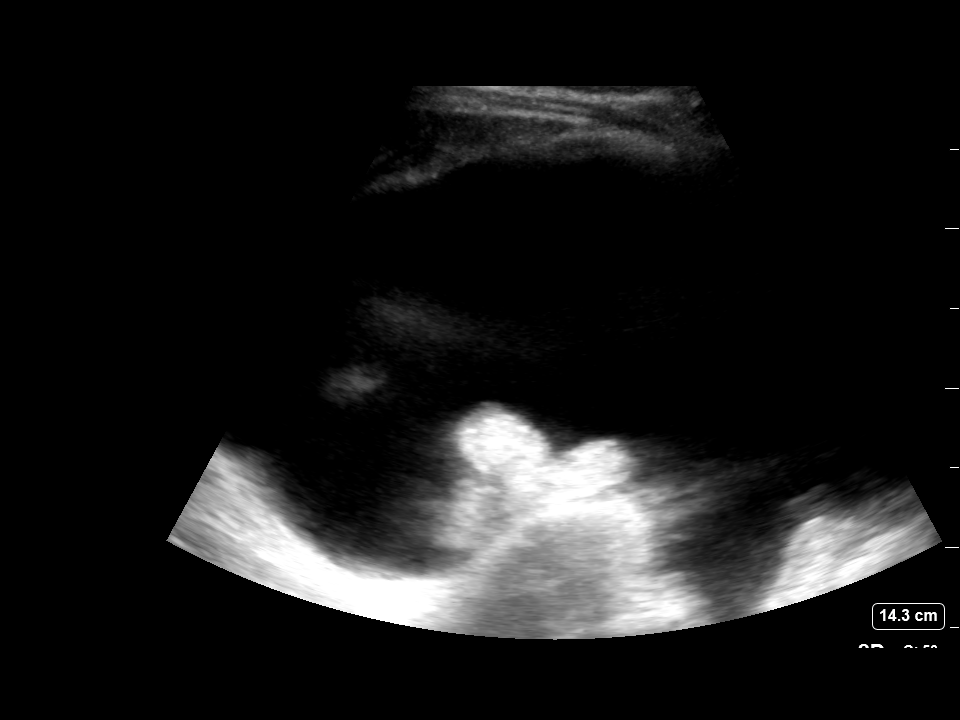

[4 of 4 positions shown; findings below may reference images not displayed]

EXAM:
ULTRASOUND GUIDED  PARACENTESIS

MEDICATIONS:
None.

COMPLICATIONS:
None immediate.

PROCEDURE:
Informed written consent was obtained from the patient after a
discussion of the risks, benefits and alternatives to treatment. A
timeout was performed prior to the initiation of the procedure.

Initial ultrasound scanning demonstrates a moderate amount of
ascites within the right lower abdominal quadrant. The right lower
abdomen was prepped and draped in the usual sterile fashion. 1%
lidocaine was used for local anesthesia.

Following this, a 8 Fr Safe-T-Centesis catheter was introduced. An
ultrasound image was saved for documentation purposes. The
paracentesis was performed. The catheter was removed and a dressing
was applied. The patient tolerated the procedure well without
immediate post procedural complication.
Patient received post-procedure intravenous albumin; see nursing
notes for details.
FINDINGS: A total of approximately 6 L of straw-colored fluid was removed.
Samples were sent to the laboratory as requested by the clinical
team.
IMPRESSION: Successful ultrasound-guided paracentesis yielding 6 liters of
peritoneal fluid.

## 2022-09-20 ENCOUNTER — Ambulatory Visit (HOSPITAL_COMMUNITY)
Admission: RE | Admit: 2022-09-20 | Discharge: 2022-09-20 | Disposition: A | Discharge status: Home / Self Care | Payer: Medicare PPO | Source: Ambulatory Visit | Attending: Gastroenterology | Admitting: Gastroenterology

## 2022-09-20 DIAGNOSIS — K7460 Unspecified cirrhosis of liver: Secondary | ICD-10-CM | POA: Insufficient documentation

## 2022-09-20 DIAGNOSIS — R188 Other ascites: Secondary | ICD-10-CM | POA: Insufficient documentation

## 2022-09-20 HISTORY — PX: IR PARACENTESIS: IMG2679

## 2022-09-20 LAB — BODY FLUID CELL COUNT WITH DIFFERENTIAL
Eos, Fluid: 1 %
Lymphs, Fluid: 36 %
Monocyte-Macrophage-Serous Fluid: 60 % (ref 50–90)
Neutrophil Count, Fluid: 3 % (ref 0–25)
Total Nucleated Cell Count, Fluid: 80 cu mm (ref 0–1000)

## 2022-09-20 LAB — PATHOLOGIST SMEAR REVIEW

## 2022-09-20 MED ORDER — ALBUMIN HUMAN 25 % IV SOLN
50.0000 g | Freq: Once | INTRAVENOUS | Status: AC
  Administered 2022-09-20: 50 g via INTRAVENOUS
  Filled 2022-09-20: qty 200

## 2022-09-20 MED ORDER — ALBUMIN HUMAN 25 % IV SOLN
INTRAVENOUS | Status: AC
  Filled 2022-09-20: qty 200

## 2022-09-20 MED ORDER — LIDOCAINE HCL 1 % IJ SOLN
INTRAMUSCULAR | Status: AC
  Administered 2022-09-20: 8 mL
  Filled 2022-09-20: qty 20

## 2022-09-20 NOTE — Procedures (Signed)
PROCEDURE SUMMARY:  Successful ultrasound guided paracentesis from the right lower quadrant.  Yielded 7.4 L of clear yellow fluid.  No immediate complications.  The patient tolerated the procedure well.   Specimen sent for labs.  EBL < 2 mL  The patient has previously been formally evaluated by the [facility] and is being actively followed for potential future intervention. Patient has upcoming appointments at [facility] within the next month for possible liver transplant.   Soyla Dryer, [place] [phone] 09/20/2022, 3:33 PM

## 2022-09-21 ENCOUNTER — Telehealth: Payer: Self-pay

## 2022-09-21 ENCOUNTER — Other Ambulatory Visit (HOSPITAL_COMMUNITY): Payer: Self-pay

## 2022-09-21 ENCOUNTER — Encounter: Payer: Self-pay | Admitting: Gastroenterology

## 2022-09-21 ENCOUNTER — Ambulatory Visit: Payer: Medicare PPO | Admitting: Gastroenterology

## 2022-09-21 VITALS — BP 108/53 | HR 65 | Temp 99.3°F | Resp 16 | Ht 66.5 in | Wt 150.0 lb

## 2022-09-21 DIAGNOSIS — K7031 Alcoholic cirrhosis of liver with ascites: Secondary | ICD-10-CM

## 2022-09-21 DIAGNOSIS — K3189 Other diseases of stomach and duodenum: Secondary | ICD-10-CM

## 2022-09-21 DIAGNOSIS — K766 Portal hypertension: Secondary | ICD-10-CM

## 2022-09-21 MED ORDER — RIFAXIMIN 550 MG PO TABS: 550.0000 mg | ORAL_TABLET | Freq: Two times a day (BID) | ORAL | 6 refills | Status: DC

## 2022-09-21 MED ORDER — SODIUM CHLORIDE 0.9 % IV SOLN: 500.0000 mL | INTRAVENOUS | Status: DC

## 2022-09-21 NOTE — Telephone Encounter (Signed)
-----   Message from [place], DO sent at 09/21/2022 12:02 PM EST ----- Patient says that he is having issues getting his rifaximin filled.  Something w/ getting insurance approval again, even though he has already filled the Rx before and he has a prescription for 11 refills.  Can you please look into this for me?  Thanks.

## 2022-09-21 NOTE — Op Note (Signed)
[facility] Patient Name: Andrew Patton Procedure Date: 09/21/2022 10:27 AM MRN: [id number] Endoscopist: [name] , MD, [id number] Age: 62 Referring MD:  Date of Birth: [birth year] Gender: Male Account #: [id number] Procedure:                Upper GI endoscopy Indications:              Cirrhosis rule out esophageal varices as part of                            pre-transplant evaluation. Medicines:                Monitored Anesthesia Care Procedure:                Pre-Anesthesia Assessment:                           - Prior to the procedure, a History and Physical                            was performed, and patient medications and                            allergies were reviewed. The patient's tolerance of                            previous anesthesia was also reviewed. The risks                            and benefits of the procedure and the sedation                            options and risks were discussed with the patient.                            All questions were answered, and informed consent                            was obtained. Prior Anticoagulants: The patient has                            taken no anticoagulant or antiplatelet agents. ASA                            Grade Assessment: III - A patient with severe                            systemic disease. After reviewing the risks and                            benefits, the patient was deemed in satisfactory                            condition to undergo the procedure.  After obtaining informed consent, the endoscope was                            passed under direct vision. Throughout the                            procedure, the patient's blood pressure, pulse, and                            oxygen saturations were monitored continuously. The                            GIF [id number] was introduced through the                            mouth, and advanced to the  second part of duodenum.                            The upper GI endoscopy was accomplished without                            difficulty. The patient tolerated the procedure                            well. Scope In: Scope Out: Findings:                 The examined esophagus was normal.                           The Z-line was regular and was found 40 cm from the                            incisors.                           Moderate portal hypertensive gastropathy was found                            in the entire examined stomach. This was previously                            biopsied.                           A medium amount of food (residue) was found in the                            gastric body and in the gastric antrum.                           The examined duodenum was normal. Complications:            No immediate complications. Estimated Blood Loss:     Estimated blood loss: none. Impression:               -  Normal esophagus.                           - Z-line regular, 40 cm from the incisors.                           - Portal hypertensive gastropathy. This was                            previously biopsied, and not re-biopsied today.                           - A medium amount of food (residue) in the stomach.                           - Normal examined duodenum.                           - No specimens collected. Recommendation:           - Patient has a contact number available for                            emergencies. The signs and symptoms of potential                            delayed complications were discussed with the                            patient. Return to normal activities tomorrow.                            Written discharge instructions were provided to the                            patient.                           - Resume previous diet.                           - Continue present medications.                           - Follow-up in the  [facility] as scheduled. [name], MD 09/21/2022 10:46:45 AM

## 2022-09-21 NOTE — Progress Notes (Signed)
Pt in recovery with monitors in place, VSS. Report given to receiving RN. Bite guard was placed with pt awake to ensure comfort. No dental or soft tissue damage noted. 

## 2022-09-21 NOTE — Telephone Encounter (Signed)
Spoke with patient's wife Hassan Rowan. Medication sent to [facility] for 6 months. Kallie Edward to take his BCBS card with her to the pharmacy. She verbalized understanding.

## 2022-09-21 NOTE — Progress Notes (Signed)
GASTROENTEROLOGY PROCEDURE H&P NOTE   Primary Care Physician: Deland Pretty, MD    Reason for Procedure:   Cirrhosis, variceal screening  Plan:    EGD  Patient is appropriate for endoscopic procedure(s) in the ambulatory ([facility]) setting.  The nature of the procedure, as well as the risks, benefits, and alternatives were carefully and thoroughly reviewed with the patient. Ample time for discussion and questions allowed. The patient understood, was satisfied, and agreed to proceed.     HPI: Andrew Patton is a 62 y.o. male who presents for EGD for EV screening. Known hx of EtOH cirrhosis, and follows at [facility].    Past Medical History:  Diagnosis Date   Allergy    Anxiety    Arthritis    Ascites    Elevated transaminase level    Fatigue    Fatty liver    Hyperlipidemia    Hypertension    "under control", not on any blood pressure medication, has lost 60 lbs   Hypothyroidism    on synthroid   Inguinal muscle strain    Insomnia    Left knee pain    "long time ago"   Multinodular goiter    Obesity    Pneumonia    Psoriasis    Tinnitus     Past Surgical History:  Procedure Laterality Date   APPENDECTOMY  1976   FOOT SURGERY Bilateral 1975   IR PARACENTESIS  11/25/2021   IR PARACENTESIS  12/02/2021   IR PARACENTESIS  12/17/2021   IR PARACENTESIS  01/05/2022   IR PARACENTESIS  02/04/2022   IR PARACENTESIS  02/15/2022   IR PARACENTESIS  03/03/2022   IR PARACENTESIS  03/10/2022   IR PARACENTESIS  03/16/2022   IR PARACENTESIS  03/23/2022   IR PARACENTESIS  03/29/2022   IR PARACENTESIS  04/06/2022   IR PARACENTESIS  04/20/2022   IR PARACENTESIS  05/04/2022   IR PARACENTESIS  05/20/2022   IR PARACENTESIS  05/31/2022   IR PARACENTESIS  06/11/2022   IR PARACENTESIS  06/18/2022   IR PARACENTESIS  06/25/2022   IR PARACENTESIS  07/02/2022   IR PARACENTESIS  07/09/2022   IR PARACENTESIS  07/20/2022   IR PARACENTESIS  07/27/2022   IR PARACENTESIS  08/03/2022   IR  PARACENTESIS  08/09/2022   IR PARACENTESIS  08/13/2022   IR PARACENTESIS  08/17/2022   IR PARACENTESIS  08/20/2022   IR PARACENTESIS  08/23/2022   IR PARACENTESIS  08/26/2022   IR PARACENTESIS  08/30/2022   IR PARACENTESIS  09/02/2022   IR PARACENTESIS  09/07/2022   IR PARACENTESIS  09/10/2022   IR PARACENTESIS  09/15/2022   IR PARACENTESIS  09/20/2022   IR RADIOLOGIST EVAL & MGMT  12/17/2021   IR RADIOLOGIST EVAL & MGMT  12/31/2021   NOSE SURGERY     ROTATOR CUFF REPAIR Left    left   SHOULDER SURGERY Right 12/20/2019   [place]     Prior to Admission medications   Medication Sig Start Date End Date Taking? Authorizing Provider  busPIRone (BUSPAR) 15 MG tablet Take 7.5-15 mg by mouth 2 (two) times daily. Take 1/2 tablet (7.5 mg) in the morning and Take 1 tablet (15 mg) at supper   Yes [provider]  cefdinir (OMNICEF) 300 MG capsule Take 1 capsule (300 mg total) by mouth daily. 07/08/22 11/05/22 Yes Charlynne Cousins, MD  clobetasol cream (TEMOVATE) 0.05 % Apply topically. 10/21/20  Yes [provider]  colesevelam (  WELCHOL) 625 MG tablet Take 625 mg by mouth daily with breakfast.   Yes [provider]  escitalopram (LEXAPRO) 5 MG tablet Take 5 mg by mouth daily. 02/14/22  Yes [provider]  ezetimibe (ZETIA) 10 MG tablet Take 10 mg by mouth daily.   Yes [provider]  folic acid (FOLVITE) 1 MG tablet Take 1 mg by mouth at bedtime.   Yes [provider]  furosemide (LASIX) 20 MG tablet Take 1 tablet (20 mg total) by mouth 2 (two) times daily. 07/08/22  Yes Charlynne Cousins, MD  gabapentin (NEURONTIN) 100 MG capsule Take by mouth. 08/11/22 08/11/23 Yes [provider]  lactulose (CHRONULAC) 10 GM/15ML solution Take 15 mLs (10 g total) by mouth 2 (two) times daily as needed for mild constipation (for bowel movments 2-3 per day). Patient taking differently: Take 10 g by mouth 2 (two) times daily as needed for mild  constipation (for bowel movments 2-3 per day). Pt gives usually between 7- 10 mls as needed 07/06/21  Yes Pokhrel, Laxman, MD  levETIRAcetam (KEPPRA XR) 500 MG 24 hr tablet Take 2 tablets (1,000 mg total) by mouth daily. Patient taking differently: Take 1,000 mg by mouth at bedtime. 12/10/21  Yes Melvenia Beam, MD  levothyroxine (SYNTHROID) 88 MCG tablet Take 88 mcg by mouth every morning. 02/14/22  Yes [provider]  midodrine (PROAMATINE) 5 MG tablet Take 3 tablets (15 mg total) by mouth 3 (three) times daily with meals. 07/08/22  Yes Charlynne Cousins, MD  Multiple Vitamin (MULTIVITAMIN PO) Take by mouth.   Yes [provider]  oxyCODONE (OXY IR/ROXICODONE) 5 MG immediate release tablet Take 5 mg by mouth 4 (four) times daily as needed.   Yes [provider]  pantoprazole (PROTONIX) 20 MG tablet Take 1 tablet (20 mg total) by mouth daily. 08/16/22  Yes Evett Kassa V, DO  prednisoLONE acetate (PRED FORTE) 1 % ophthalmic suspension Place into the left eye. 08/31/22  Yes [provider]  simethicone (MYLICON) 0000000 MG chewable tablet Chew by mouth.   Yes [provider]  sodium chloride (SALINE MIST) 0.65 % nasal spray Place 1 spray into the nose 3 (three) times daily.   Yes [provider]  spironolactone (ALDACTONE) 25 MG tablet Take 1 tablet by mouth daily. 08/25/22 08/25/23 Yes [provider]  thiamine 100 MG tablet Take 1 tablet (100 mg total) by mouth daily. 07/06/21  Yes Pokhrel, Laxman, MD  XIFAXAN 550 MG TABS tablet Take 1 tablet (550 mg total) by mouth 2 (two) times daily. 08/12/22  Yes Seaton Hofmann V, DO  zinc sulfate 220 (50 Zn) MG capsule Take by mouth. 06/04/22 06/04/23 Yes [provider]  cetirizine (ZYRTEC) 5 MG chewable tablet Chew 5 mg by mouth daily. Patient not taking: Reported on 09/21/2022    [provider]  clonazePAM (KLONOPIN) 0.5 MG tablet Take 0.5 mg by mouth at bedtime. Patient not  taking: Reported on 09/21/2022    [provider]  Emollient (AQUAPHOR ADV THERAPY HEALING) OINT Place 1 Application into left nostril 2 (two) times daily.    [provider]  hydrocortisone (CORTEF) 10 MG tablet Take 1 tablet (10 mg total) by mouth 2 (two) times daily. Patient not taking: Reported on 09/21/2022 07/08/22   Charlynne Cousins, MD  naloxone [facility]) nasal spray 4 mg/0.1 mL Place into the nose. 07/14/22   [provider]  ofloxacin (OCUFLOX) 0.3 % ophthalmic solution SMARTSIG:In Eye(s) 08/31/22  [provider]  ondansetron (ZOFRAN ODT) 4 MG disintegrating tablet Take 1 tablet (4 mg total) by mouth every 8 (eight) hours as needed for nausea or vomiting. Patient taking differently: Take 4 mg by mouth every 8 (eight) hours as needed for nausea or vomiting (dissolve orally). 02/08/20   Volanda Napoleon, PA-C    Current Outpatient Medications  Medication Sig Dispense Refill   busPIRone (BUSPAR) 15 MG tablet Take 7.5-15 mg by mouth 2 (two) times daily. Take 1/2 tablet (7.5 mg) in the morning and Take 1 tablet (15 mg) at supper     cefdinir (OMNICEF) 300 MG capsule Take 1 capsule (300 mg total) by mouth daily. 30 capsule 3   clobetasol cream (TEMOVATE) 0.05 % Apply topically.     colesevelam (WELCHOL) 625 MG tablet Take 625 mg by mouth daily with breakfast.     escitalopram (LEXAPRO) 5 MG tablet Take 5 mg by mouth daily.     ezetimibe (ZETIA) 10 MG tablet Take 10 mg by mouth daily.     folic acid (FOLVITE) 1 MG tablet Take 1 mg by mouth at bedtime.     furosemide (LASIX) 20 MG tablet Take 1 tablet (20 mg total) by mouth 2 (two) times daily. 60 tablet 3   gabapentin (NEURONTIN) 100 MG capsule Take by mouth.     lactulose (CHRONULAC) 10 GM/15ML solution Take 15 mLs (10 g total) by mouth 2 (two) times daily as needed for mild constipation (for bowel movments 2-3 per day). (Patient taking differently: Take 10 g by mouth 2 (two) times daily as needed for mild  constipation (for bowel movments 2-3 per day). Pt gives usually between 7- 10 mls as needed)     levETIRAcetam (KEPPRA XR) 500 MG 24 hr tablet Take 2 tablets (1,000 mg total) by mouth daily. (Patient taking differently: Take 1,000 mg by mouth at bedtime.) 180 tablet 3   levothyroxine (SYNTHROID) 88 MCG tablet Take 88 mcg by mouth every morning.     midodrine (PROAMATINE) 5 MG tablet Take 3 tablets (15 mg total) by mouth 3 (three) times daily with meals. 270 tablet 3   Multiple Vitamin (MULTIVITAMIN PO) Take by mouth.     oxyCODONE (OXY IR/ROXICODONE) 5 MG immediate release tablet Take 5 mg by mouth 4 (four) times daily as needed.     pantoprazole (PROTONIX) 20 MG tablet Take 1 tablet (20 mg total) by mouth daily. 90 tablet 1   prednisoLONE acetate (PRED FORTE) 1 % ophthalmic suspension Place into the left eye.     simethicone (MYLICON) 0000000 MG chewable tablet Chew by mouth.     sodium chloride (SALINE MIST) 0.65 % nasal spray Place 1 spray into the nose 3 (three) times daily.     spironolactone (ALDACTONE) 25 MG tablet Take 1 tablet by mouth daily.     thiamine 100 MG tablet Take 1 tablet (100 mg total) by mouth daily. 90 tablet 0   XIFAXAN 550 MG TABS tablet Take 1 tablet (550 mg total) by mouth 2 (two) times daily. 60 tablet 11   zinc sulfate 220 (50 Zn) MG capsule Take by mouth.     cetirizine (ZYRTEC) 5 MG chewable tablet Chew 5 mg by mouth daily. (Patient not taking: Reported on 09/21/2022)     clonazePAM (KLONOPIN) 0.5 MG tablet Take 0.5 mg by mouth at bedtime. (Patient not taking: Reported on 09/21/2022)     Emollient (AQUAPHOR ADV THERAPY HEALING) OINT Place 1 Application into left nostril 2 (two)  times daily.     hydrocortisone (CORTEF) 10 MG tablet Take 1 tablet (10 mg total) by mouth 2 (two) times daily. (Patient not taking: Reported on 09/21/2022) 60 tablet 1   naloxone (NARCAN) nasal spray 4 mg/0.1 mL Place into the nose.     ofloxacin (OCUFLOX) 0.3 % ophthalmic solution SMARTSIG:In  Eye(s)     ondansetron (ZOFRAN ODT) 4 MG disintegrating tablet Take 1 tablet (4 mg total) by mouth every 8 (eight) hours as needed for nausea or vomiting. (Patient taking differently: Take 4 mg by mouth every 8 (eight) hours as needed for nausea or vomiting (dissolve orally).) 6 tablet 0   Current Facility-Administered Medications  Medication Dose Route Frequency Provider Last Rate Last Admin   0.9 %  sodium chloride infusion  500 mL Intravenous Continuous Orlandus Borowski V, DO        Allergies as of 09/21/2022 - Review Complete 09/21/2022  Allergen Reaction Noted   Fentanyl Shortness Of Breath 06/18/2022   Meloxicam Other (See Comments) 03/08/2019   Rosuvastatin Other (See Comments) 07/02/2021   Wound dressing adhesive  07/01/2022   Ciprofloxacin Rash 06/17/2022   Penicillins Rash 01/24/2013    Family History  Problem Relation Age of Onset   Breast cancer Mother    Hyperlipidemia Mother    Other Mother        ? form of parkinson's    Parkinsonism Mother    Bladder Cancer Father    Parkinson's disease Father    Memory loss Maternal Grandmother        in her 60s   Birth defects Maternal Grandfather    Birth defects Paternal Grandmother    Colon cancer Neg Hx    Esophageal cancer Neg Hx    Rectal cancer Neg Hx    Stomach cancer Neg Hx    Colon polyps Neg Hx    Dementia Neg Hx    Alzheimer's disease Neg Hx     Social History   Socioeconomic History   Marital status: Married    Spouse name: Not on file   Number of children: 2   Years of education: Not on file   Highest education level: Associate degree: academic program  Occupational History   Occupation: Press photographer  Tobacco Use   Smoking status: Former    Types: Cigarettes    Quit date: 03/27/1994    Years since quitting: 28.5   Smokeless tobacco: Former    Types: Chew   Tobacco comments:    did chew as a teenager  Scientific laboratory technician Use: Never used  Substance and Sexual Activity   Alcohol use: Not Currently     Alcohol/week: 2.0 standard drinks of alcohol    Types: 2 Glasses of wine per week   Drug use: No   Sexual activity: Not on file  Other Topics Concern   Not on file  Social History Narrative   Lives at home with wife    Right handed   Caffeine: maybe 1-3 cups/week   Social Determinants of Health   Financial Resource Strain: Not on file  Food Insecurity: No Food Insecurity (06/01/2022)   Hunger Vital Sign    Worried About Running Out of Food in the Last Year: Never true    Ran Out of Food in the Last Year: Never true  Transportation Needs: No Transportation Needs (06/01/2022)   PRAPARE - Hydrologist (Medical): No    Lack of Transportation (Non-Medical): No  Physical Activity:  Not on file  Stress: Not on file  Social Connections: Not on file  Intimate Partner Violence: Not At Risk (06/01/2022)   Humiliation, Afraid, Rape, and Kick questionnaire    Fear of Current or Ex-Partner: No    Emotionally Abused: No    Physically Abused: No    Sexually Abused: No    Physical Exam: Vital signs in last 24 hours: @BP$  (!) 95/48   Pulse 68   Temp 99.3 F (37.4 C)   Ht 5' 6.5" (1.689 m)   Wt 150 lb (68 kg)   SpO2 100%   BMI 23.85 kg/m  GEN: NAD EYE: Sclerae anicteric ENT: MMM CV: Non-tachycardic Pulm: CTA b/l GI: Soft, NT/ND NEURO:  Alert & Oriented x 3   [name], DO [facility]   09/21/2022 10:23 AM

## 2022-09-21 NOTE — Progress Notes (Signed)
Patient reports no changes to health or medications

## 2022-09-21 NOTE — Telephone Encounter (Signed)
Secure chat sent to [facility] to follow up on Xifaxan RX to determine if PA is needed. Andee Lineman, CMA included on chat as this medication is a non-biologic.

## 2022-09-21 NOTE — Patient Instructions (Signed)
   Follow up [facility] as scheduled    YOU HAD AN ENDOSCOPIC PROCEDURE TODAY AT [facility]:   Refer to the procedure report that was given to you for any specific questions about what was found during the examination.  If the procedure report does not answer your questions, please call your gastroenterologist to clarify.  If you requested that your care partner not be given the details of your procedure findings, then the procedure report has been included in a sealed envelope for you to review at your convenience later.  YOU SHOULD EXPECT: Some feelings of bloating in the abdomen. Passage of more gas than usual.  Walking can help get rid of the air that was put into your GI tract during the procedure and reduce the bloating. If you had a lower endoscopy (such as a colonoscopy or flexible sigmoidoscopy) you may notice spotting of blood in your stool or on the toilet paper. If you underwent a bowel prep for your procedure, you may not have a normal bowel movement for a few days.  Please Note:  You might notice some irritation and congestion in your nose or some drainage.  This is from the oxygen used during your procedure.  There is no need for concern and it should clear up in a day or so.  SYMPTOMS TO REPORT IMMEDIATELY:  Following upper endoscopy (EGD)  Vomiting of blood or coffee ground material  New chest pain or pain under the shoulder blades  Painful or persistently difficult swallowing  New shortness of breath  Fever of 100F or higher  Black, tarry-looking stools  For urgent or emergent issues, a gastroenterologist can be reached at any hour by calling [phone]. Do not use MyChart messaging for urgent concerns.    DIET:  We do recommend a small meal at first, but then you may proceed to your regular diet.  Drink plenty of fluids but you should avoid alcoholic beverages for 24 hours.  ACTIVITY:  You should plan to take it easy for the  rest of today and you should NOT DRIVE or use heavy machinery until tomorrow (because of the sedation medicines used during the test).    FOLLOW UP: Our staff will call the number listed on your records the next business day following your procedure.  We will call around 7:15- 8:00 am to check on you and address any questions or concerns that you may have regarding the information given to you following your procedure. If we do not reach you, we will leave a message.     If any biopsies were taken you will be contacted by phone or by letter within the next 1-3 weeks.  Please call us at [phone] if you have not heard about the biopsies in 3 weeks.    SIGNATURES/CONFIDENTIALITY: You and/or your care partner have signed paperwork which will be entered into your electronic medical record.  These signatures attest to the fact that that the information above on your After Visit Summary has been reviewed and is understood.  Full responsibility of the confidentiality of this discharge information lies with you and/or your care-partner.

## 2022-09-21 NOTE — Addendum Note (Signed)
Addended by: Howell Pringle on: 09/21/2022 01:59 PM   Modules accepted: Orders

## 2022-09-22 ENCOUNTER — Other Ambulatory Visit (HOSPITAL_COMMUNITY): Payer: Self-pay | Admitting: Gastroenterology

## 2022-09-22 ENCOUNTER — Telehealth: Payer: Self-pay | Admitting: *Deleted

## 2022-09-22 DIAGNOSIS — R188 Other ascites: Secondary | ICD-10-CM

## 2022-09-22 LAB — PATHOLOGIST SMEAR REVIEW

## 2022-09-22 NOTE — Telephone Encounter (Signed)
  Follow up Call-     09/21/2022    9:11 AM 08/11/2021    8:44 AM  Call back number  Post procedure Call Back phone  # [phone] Hassan Rowan)  Permission to leave phone message Yes Yes     Patient questions:  Do you have a fever, pain , or abdominal swelling? No. Pain Score  0 *  Have you tolerated food without any problems? Yes.    Have you been able to return to your normal activities? Yes.    Do you have any questions about your discharge instructions: Diet   No. Medications  No. Follow up visit  No.  Do you have questions or concerns about your Care? No.  Actions: * If pain score is 4 or above: No action needed, pain <4.

## 2022-09-23 ENCOUNTER — Other Ambulatory Visit (HOSPITAL_BASED_OUTPATIENT_CLINIC_OR_DEPARTMENT_OTHER): Payer: Self-pay

## 2022-09-23 ENCOUNTER — Ambulatory Visit (HOSPITAL_COMMUNITY)
Admission: RE | Admit: 2022-09-23 | Discharge: 2022-09-23 | Disposition: A | Discharge status: Home / Self Care | Payer: Medicare PPO | Source: Ambulatory Visit | Attending: Gastroenterology | Admitting: Gastroenterology

## 2022-09-23 ENCOUNTER — Other Ambulatory Visit (HOSPITAL_COMMUNITY): Payer: Self-pay

## 2022-09-23 VITALS — BP 113/59

## 2022-09-23 DIAGNOSIS — K767 Hepatorenal syndrome: Secondary | ICD-10-CM

## 2022-09-23 DIAGNOSIS — K7460 Unspecified cirrhosis of liver: Secondary | ICD-10-CM | POA: Diagnosis present

## 2022-09-23 DIAGNOSIS — K7031 Alcoholic cirrhosis of liver with ascites: Secondary | ICD-10-CM

## 2022-09-23 DIAGNOSIS — R188 Other ascites: Secondary | ICD-10-CM | POA: Insufficient documentation

## 2022-09-23 HISTORY — PX: IR PARACENTESIS: IMG2679

## 2022-09-23 LAB — COMPREHENSIVE METABOLIC PANEL
ALT: 22 U/L (ref 0–44)
AST: 37 U/L (ref 15–41)
Albumin: 3.3 g/dL — ABNORMAL LOW (ref 3.5–5.0)
Alkaline Phosphatase: 109 U/L (ref 38–126)
Anion gap: 9 (ref 5–15)
BUN: 21 mg/dL (ref 8–23)
CO2: 18 mmol/L — ABNORMAL LOW (ref 22–32)
Calcium: 8.6 mg/dL — ABNORMAL LOW (ref 8.9–10.3)
Chloride: 102 mmol/L (ref 98–111)
Creatinine, Ser: 1 mg/dL (ref 0.61–1.24)
GFR, Estimated: >60 mL/min (ref >60)
Glucose, Bld: 104 mg/dL — ABNORMAL HIGH (ref 70–99)
Potassium: 4 mmol/L (ref 3.5–5.1)
Sodium: 129 mmol/L — ABNORMAL LOW (ref 135–145)
Total Bilirubin: 2.6 mg/dL — ABNORMAL HIGH (ref 0.3–1.2)
Total Protein: 5 g/dL — ABNORMAL LOW (ref 6.5–8.1)

## 2022-09-23 LAB — BODY FLUID CELL COUNT WITH DIFFERENTIAL
Eos, Fluid: 0 %
Lymphs, Fluid: 53 %
Monocyte-Macrophage-Serous Fluid: 42 % — ABNORMAL LOW (ref 50–90)
Neutrophil Count, Fluid: 5 % (ref 0–25)
Total Nucleated Cell Count, Fluid: 104 cu mm (ref 0–1000)

## 2022-09-23 LAB — PROTIME-INR
INR: 1.5 — ABNORMAL HIGH (ref 0.8–1.2)
Prothrombin Time: 18.3 seconds — ABNORMAL HIGH (ref 11.4–15.2)

## 2022-09-23 MED ORDER — ALBUMIN HUMAN 25 % IV SOLN
INTRAVENOUS | Status: AC
  Filled 2022-09-23: qty 200

## 2022-09-23 MED ORDER — ALBUMIN HUMAN 25 % IV SOLN
50.0000 g | Freq: Once | INTRAVENOUS | Status: AC
  Administered 2022-09-23: 50 g via INTRAVENOUS
  Filled 2022-09-23: qty 200

## 2022-09-23 MED ORDER — LIDOCAINE HCL 1 % IJ SOLN
INTRAMUSCULAR | Status: AC
  Filled 2022-09-23: qty 20

## 2022-09-23 NOTE — Procedures (Signed)
PROCEDURE SUMMARY:  Successful US guided paracentesis from LLQ.  Yielded 5.7L of opaque yellow ascitic fluid.  No immediate complications.  Pt tolerated well.   Specimen was sent for labs.  EBL < 68m  Tyleek Smick PA-C 09/23/2022 3:05 PM

## 2022-09-24 LAB — GRAM STAIN: Gram Stain: NONE SEEN

## 2022-09-27 LAB — PATHOLOGIST SMEAR REVIEW

## 2022-09-28 ENCOUNTER — Ambulatory Visit (HOSPITAL_COMMUNITY)
Admission: RE | Admit: 2022-09-28 | Discharge: 2022-09-28 | Disposition: A | Discharge status: Home / Self Care | Payer: Medicare PPO | Source: Ambulatory Visit | Attending: Gastroenterology | Admitting: Gastroenterology

## 2022-09-28 DIAGNOSIS — R188 Other ascites: Secondary | ICD-10-CM | POA: Insufficient documentation

## 2022-09-28 DIAGNOSIS — K7460 Unspecified cirrhosis of liver: Secondary | ICD-10-CM | POA: Diagnosis present

## 2022-09-28 HISTORY — PX: IR PARACENTESIS: IMG2679

## 2022-09-28 MED ORDER — LIDOCAINE HCL 1 % IJ SOLN
INTRAMUSCULAR | Status: AC
  Administered 2022-09-28: 10 mL
  Filled 2022-09-28: qty 20

## 2022-09-28 MED ORDER — ALBUMIN HUMAN 25 % IV SOLN: 25.0000 g | Freq: Once | INTRAVENOUS | Status: AC

## 2022-09-28 MED ORDER — ALBUMIN HUMAN 25 % IV SOLN
INTRAVENOUS | Status: AC
  Administered 2022-09-28: 25 g via INTRAVENOUS
  Filled 2022-09-28: qty 150

## 2022-09-28 NOTE — Procedures (Signed)
PROCEDURE SUMMARY:  Successful US guided paracentesis from right lateral abdomen.  Yielded 4.5 liters of clear yellow fluid.  No immediate complications.  Patient tolerated well.  EBL = trace  Judie Grieve Ricardo Schubach PA-C 09/28/2022 3:49 PM

## 2022-09-30 LAB — CULTURE, BODY FLUID W GRAM STAIN -BOTTLE: Culture: NO GROWTH

## 2022-10-01 ENCOUNTER — Other Ambulatory Visit (HOSPITAL_COMMUNITY): Payer: BC Managed Care – PPO

## 2022-11-16 ENCOUNTER — Other Ambulatory Visit (HOSPITAL_COMMUNITY)
Admission: AD | Admit: 2022-11-16 | Discharge: 2022-11-16 | Disposition: A | Discharge status: Home / Self Care | Payer: Medicare PPO | Source: Ambulatory Visit | Attending: General Surgery | Admitting: General Surgery

## 2022-11-16 DIAGNOSIS — D849 Immunodeficiency, unspecified: Secondary | ICD-10-CM | POA: Diagnosis not present

## 2022-11-16 DIAGNOSIS — Z5181 Encounter for therapeutic drug level monitoring: Secondary | ICD-10-CM | POA: Insufficient documentation

## 2022-11-16 DIAGNOSIS — Z944 Liver transplant status: Secondary | ICD-10-CM | POA: Diagnosis present

## 2022-11-16 DIAGNOSIS — K7031 Alcoholic cirrhosis of liver with ascites: Secondary | ICD-10-CM | POA: Diagnosis not present

## 2022-11-16 LAB — COMPREHENSIVE METABOLIC PANEL
ALT: 28 U/L (ref 0–44)
AST: 17 U/L (ref 15–41)
Albumin: 3.1 g/dL — ABNORMAL LOW (ref 3.5–5.0)
Alkaline Phosphatase: 215 U/L — ABNORMAL HIGH (ref 38–126)
Anion gap: 8 (ref 5–15)
BUN: 22 mg/dL (ref 8–23)
CO2: 25 mmol/L (ref 22–32)
Calcium: 8.6 mg/dL — ABNORMAL LOW (ref 8.9–10.3)
Chloride: 105 mmol/L (ref 98–111)
Creatinine, Ser: 1.3 mg/dL — ABNORMAL HIGH (ref 0.61–1.24)
GFR, Estimated: >60 mL/min (ref >60)
Glucose, Bld: 119 mg/dL — ABNORMAL HIGH (ref 70–99)
Potassium: 4.1 mmol/L (ref 3.5–5.1)
Sodium: 138 mmol/L (ref 135–145)
Total Bilirubin: 0.5 mg/dL (ref 0.3–1.2)
Total Protein: 6.1 g/dL — ABNORMAL LOW (ref 6.5–8.1)

## 2022-11-16 LAB — CBC WITH DIFFERENTIAL/PLATELET
Abs Immature Granulocytes: 0.03 10*3/uL (ref 0.00–0.07)
Basophils Absolute: 0.1 10*3/uL (ref 0.0–0.1)
Basophils Relative: 1 %
Eosinophils Absolute: 0.1 10*3/uL (ref 0.0–0.5)
Eosinophils Relative: 1 %
HCT: 29.5 % — ABNORMAL LOW (ref 39.0–52.0)
Hemoglobin: 9.5 g/dL — ABNORMAL LOW (ref 13.0–17.0)
Immature Granulocytes: 0 %
Lymphocytes Relative: 19 %
Lymphs Abs: 1.4 10*3/uL (ref 0.7–4.0)
MCH: 31.1 pg (ref 26.0–34.0)
MCHC: 32.2 g/dL (ref 30.0–36.0)
MCV: 96.7 fL (ref 80.0–100.0)
Monocytes Absolute: 0.8 10*3/uL (ref 0.1–1.0)
Monocytes Relative: 10 %
Neutro Abs: 5 10*3/uL (ref 1.7–7.7)
Neutrophils Relative %: 69 %
Platelets: 566 10*3/uL — ABNORMAL HIGH (ref 150–400)
RBC: 3.05 MIL/uL — ABNORMAL LOW (ref 4.22–5.81)
RDW: 15.2 % (ref 11.5–15.5)
WBC: 7.3 10*3/uL (ref 4.0–10.5)
nRBC: 0 % (ref 0.0–0.2)

## 2022-11-16 LAB — MAGNESIUM: Magnesium: 1.6 mg/dL — ABNORMAL LOW (ref 1.7–2.4)

## 2022-11-18 LAB — TACROLIMUS LEVEL: Tacrolimus (FK506) - LabCorp: 8.7 ng/mL (ref 2.0–20.0)

## 2022-11-20 LAB — MISC LABCORP TEST (SEND OUT): Labcorp test code: 791584

## 2022-11-25 ENCOUNTER — Emergency Department (HOSPITAL_COMMUNITY)
Admission: EM | Admit: 2022-11-25 | Discharge: 2022-11-25 | Disposition: A | Discharge status: Other Acute Inpatient Hospital | Payer: Medicare PPO | Attending: Emergency Medicine | Admitting: Emergency Medicine

## 2022-11-25 ENCOUNTER — Encounter (HOSPITAL_COMMUNITY): Payer: Self-pay

## 2022-11-25 ENCOUNTER — Other Ambulatory Visit: Payer: Self-pay

## 2022-11-25 ENCOUNTER — Emergency Department (HOSPITAL_COMMUNITY): Payer: Medicare PPO

## 2022-11-25 DIAGNOSIS — I10 Essential (primary) hypertension: Secondary | ICD-10-CM | POA: Insufficient documentation

## 2022-11-25 DIAGNOSIS — K460 Unspecified abdominal hernia with obstruction, without gangrene: Secondary | ICD-10-CM | POA: Diagnosis not present

## 2022-11-25 DIAGNOSIS — E119 Type 2 diabetes mellitus without complications: Secondary | ICD-10-CM | POA: Insufficient documentation

## 2022-11-25 DIAGNOSIS — K429 Umbilical hernia without obstruction or gangrene: Secondary | ICD-10-CM | POA: Diagnosis not present

## 2022-11-25 DIAGNOSIS — R109 Unspecified abdominal pain: Secondary | ICD-10-CM | POA: Diagnosis present

## 2022-11-25 LAB — COMPREHENSIVE METABOLIC PANEL
ALT: 47 U/L — ABNORMAL HIGH (ref 0–44)
AST: 36 U/L (ref 15–41)
Albumin: 3.7 g/dL (ref 3.5–5.0)
Alkaline Phosphatase: 274 U/L — ABNORMAL HIGH (ref 38–126)
Anion gap: 8 (ref 5–15)
BUN: 21 mg/dL (ref 8–23)
CO2: 26 mmol/L (ref 22–32)
Calcium: 8.8 mg/dL — ABNORMAL LOW (ref 8.9–10.3)
Chloride: 104 mmol/L (ref 98–111)
Creatinine, Ser: 1.28 mg/dL — ABNORMAL HIGH (ref 0.61–1.24)
GFR, Estimated: >60 mL/min (ref >60)
Glucose, Bld: 124 mg/dL — ABNORMAL HIGH (ref 70–99)
Potassium: 4.7 mmol/L (ref 3.5–5.1)
Sodium: 138 mmol/L (ref 135–145)
Total Bilirubin: 0.5 mg/dL (ref 0.3–1.2)
Total Protein: 6.7 g/dL (ref 6.5–8.1)

## 2022-11-25 LAB — CBC WITH DIFFERENTIAL/PLATELET
Abs Immature Granulocytes: 0.13 10*3/uL — ABNORMAL HIGH (ref 0.00–0.07)
Basophils Absolute: 0.1 10*3/uL (ref 0.0–0.1)
Basophils Relative: 0 %
Eosinophils Absolute: 0 10*3/uL (ref 0.0–0.5)
Eosinophils Relative: 0 %
HCT: 31.6 % — ABNORMAL LOW (ref 39.0–52.0)
Hemoglobin: 10.3 g/dL — ABNORMAL LOW (ref 13.0–17.0)
Immature Granulocytes: 1 %
Lymphocytes Relative: 5 %
Lymphs Abs: 0.6 10*3/uL — ABNORMAL LOW (ref 0.7–4.0)
MCH: 30.7 pg (ref 26.0–34.0)
MCHC: 32.6 g/dL (ref 30.0–36.0)
MCV: 94 fL (ref 80.0–100.0)
Monocytes Absolute: 0.7 10*3/uL (ref 0.1–1.0)
Monocytes Relative: 6 %
Neutro Abs: 9.7 10*3/uL — ABNORMAL HIGH (ref 1.7–7.7)
Neutrophils Relative %: 88 %
Platelets: 601 10*3/uL — ABNORMAL HIGH (ref 150–400)
RBC: 3.36 MIL/uL — ABNORMAL LOW (ref 4.22–5.81)
RDW: 14.4 % (ref 11.5–15.5)
WBC: 11.2 10*3/uL — ABNORMAL HIGH (ref 4.0–10.5)
nRBC: 0 % (ref 0.0–0.2)

## 2022-11-25 LAB — LIPASE, BLOOD: Lipase: 29 U/L (ref 11–51)

## 2022-11-25 LAB — LACTIC ACID, PLASMA: Lactic Acid, Venous: 1 mmol/L (ref 0.5–1.9)

## 2022-11-25 MED ORDER — IOHEXOL 350 MG/ML SOLN
75.0000 mL | Freq: Once | INTRAVENOUS | Status: AC | PRN
  Administered 2022-11-25: 75 mL via INTRAVENOUS

## 2022-11-25 MED ORDER — MORPHINE SULFATE (PF) 4 MG/ML IV SOLN
4.0000 mg | Freq: Once | INTRAVENOUS | Status: AC
  Administered 2022-11-25: 4 mg via INTRAVENOUS
  Filled 2022-11-25: qty 1

## 2022-11-25 MED ORDER — HYDROMORPHONE HCL 1 MG/ML IJ SOLN
1.0000 mg | Freq: Once | INTRAMUSCULAR | Status: AC
  Administered 2022-11-25: 1 mg via INTRAVENOUS
  Filled 2022-11-25: qty 1

## 2022-11-25 NOTE — ED Provider Notes (Signed)
[place] EMERGENCY DEPARTMENT AT [facility] Provider Note   CSN: [id number] Arrival date & time: 11/25/22  1510     History  Chief Complaint  Patient presents with   Hernia    Andrew Patton is a 62 y.o. male.  Patient with history of hypertension, T2DM, alcoholic liver disease status post transplant at [facility] on 3/1 presents today with complaints of abdominal hernia pain. He states that same began yesterday when he was having a bowel movement and has been persistent since. States when he woke up this morning his pain was out of control despite taking home narcotics. Also states that the area overlying his skin has become progressively more blue since yesterday as well. He called his transplant team and sent them pictures and they recommended that he come to [facility] for evaluation, however he did not feel like he could make it all the way to [facility] with how severe his pain was and therefore was recommended that he come here for pain control and evaluation and then be transported from here to [facility]. Patient denies any fevers, chills, nausea, vomiting, or diarrhea. He was told that he needed to wait until 6 months post op to have his hernia repaired. He also reportedly had a paracentesis yesterday to try and relieve pressure around his hernia.   The history is provided by the patient. No language interpreter was used.       Home Medications Prior to Admission medications   Medication Sig Start Date End Date Taking? Authorizing Provider  busPIRone (BUSPAR) 15 MG tablet Take 7.5-15 mg by mouth 2 (two) times daily. Take 1/2 tablet (7.5 mg) in the morning and Take 1 tablet (15 mg) at supper    [provider]  cetirizine (ZYRTEC) 5 MG chewable tablet Chew 5 mg by mouth daily. Patient not taking: Reported on 09/21/2022    [provider]  clobetasol cream (TEMOVATE) 0.05 % Apply topically. 10/21/20   [provider]  clonazePAM (KLONOPIN) 0.5 MG tablet Take 0.5  mg by mouth at bedtime. Patient not taking: Reported on 09/21/2022    [provider]  colesevelam (WELCHOL) 625 MG tablet Take 625 mg by mouth daily with breakfast.    [provider]  Emollient (AQUAPHOR ADV THERAPY HEALING) OINT Place 1 Application into left nostril 2 (two) times daily.    [provider]  escitalopram (LEXAPRO) 5 MG tablet Take 5 mg by mouth daily. 02/14/22   [provider]  ezetimibe (ZETIA) 10 MG tablet Take 10 mg by mouth daily.    [provider]  folic acid (FOLVITE) 1 MG tablet Take 1 mg by mouth at bedtime.    [provider]  furosemide (LASIX) 20 MG tablet Take 1 tablet (20 mg total) by mouth 2 (two) times daily. 07/08/22   Marinda Elk, MD  gabapentin (NEURONTIN) 100 MG capsule Take by mouth. 08/11/22 08/11/23  [provider]  hydrocortisone (CORTEF) 10 MG tablet Take 1 tablet (10 mg total) by mouth 2 (two) times daily. Patient not taking: Reported on 09/21/2022 07/08/22   Marinda Elk, MD  lactulose (CHRONULAC) 10 GM/15ML solution Take 15 mLs (10 g total) by mouth 2 (two) times daily as needed for mild constipation (for bowel movments 2-3 per day). Patient taking differently: Take 10 g by mouth 2 (two) times daily as needed for mild constipation (for bowel movments 2-3 per day). Pt gives usually between 7- 10 mls as needed 07/06/21  Pokhrel, Laxman, MD  levETIRAcetam (KEPPRA XR) 500 MG 24 hr tablet Take 2 tablets (1,000 mg total) by mouth daily. Patient taking differently: Take 1,000 mg by mouth at bedtime. 12/10/21   Anson Fret, MD  levothyroxine (SYNTHROID) 88 MCG tablet Take 88 mcg by mouth every morning. 02/14/22   [provider]  midodrine (PROAMATINE) 5 MG tablet Take 3 tablets (15 mg total) by mouth 3 (three) times daily with meals. 07/08/22   Marinda Elk, MD  Multiple Vitamin (MULTIVITAMIN PO) Take by mouth.    [provider]  naloxone [facility]) nasal spray  4 mg/0.1 mL Place into the nose. 07/14/22   [provider]  ofloxacin (OCUFLOX) 0.3 % ophthalmic solution SMARTSIG:In Eye(s) 08/31/22   [provider]  ondansetron (ZOFRAN ODT) 4 MG disintegrating tablet Take 1 tablet (4 mg total) by mouth every 8 (eight) hours as needed for nausea or vomiting. Patient taking differently: Take 4 mg by mouth every 8 (eight) hours as needed for nausea or vomiting (dissolve orally). 02/08/20   Maxwell Caul, PA-C  oxyCODONE (OXY IR/ROXICODONE) 5 MG immediate release tablet Take 5 mg by mouth 4 (four) times daily as needed.    [provider]  pantoprazole (PROTONIX) 20 MG tablet Take 1 tablet (20 mg total) by mouth daily. 08/16/22   Cirigliano, Vito V, DO  prednisoLONE acetate (PRED FORTE) 1 % ophthalmic suspension Place into the left eye. 08/31/22   [provider]  rifaximin (XIFAXAN) 550 MG TABS tablet Take 1 tablet (550 mg total) by mouth 2 (two) times daily. 09/21/22   Cirigliano, Vito V, DO  simethicone (MYLICON) 125 MG chewable tablet Chew by mouth.    [provider]  sodium chloride (SALINE MIST) 0.65 % nasal spray Place 1 spray into the nose 3 (three) times daily.    [provider]  spironolactone (ALDACTONE) 25 MG tablet Take 1 tablet by mouth daily. 08/25/22 08/25/23  [provider]  thiamine 100 MG tablet Take 1 tablet (100 mg total) by mouth daily. 07/06/21   Pokhrel, Rebekah Chesterfield, MD  zinc sulfate 220 (50 Zn) MG capsule Take by mouth. 06/04/22 06/04/23  [provider]      Allergies    Fentanyl, Meloxicam, Rosuvastatin, Wound dressing adhesive, Ciprofloxacin, and Penicillins    Review of Systems   Review of Systems  Gastrointestinal:  Positive for abdominal pain.  All other systems reviewed and are negative.   Physical Exam Updated Vital Signs BP (!) 151/78 (BP Location: Right Arm)   Pulse 60   Temp 97.7 F (36.5 C) (Oral)   Resp 16   Ht 5\' 6"  (1.676 m)   Wt 68 kg   SpO2 98%    BMI 24.20 kg/m  Physical Exam Vitals and nursing note reviewed.  Constitutional:      General: He is not in acute distress.    Appearance: Normal appearance. He is normal weight. He is not ill-appearing, toxic-appearing or diaphoretic.     Comments: In obvious discomfort  HENT:     Head: Normocephalic and atraumatic.  Cardiovascular:     Rate and Rhythm: Normal rate.  Pulmonary:     Effort: Pulmonary effort is normal. No respiratory distress.  Abdominal:     Comments: Large discolored abdominal hernia that his hard to the touch and extremely tender to palpation. Unable to reduce. See images below for further   Musculoskeletal:        General: Normal range of motion.  Cervical back: Normal range of motion.  Skin:    General: Skin is warm and dry.  Neurological:     General: No focal deficit present.     Mental Status: He is alert.  Psychiatric:        Mood and Affect: Mood normal.        Behavior: Behavior normal.     ED Results / Procedures / Treatments   Labs (all labs ordered are listed, but only abnormal results are displayed) Labs Reviewed  COMPREHENSIVE METABOLIC PANEL - Abnormal; Notable for the following components:      Result Value   Glucose, Bld 124 (*)    Creatinine, Ser 1.28 (*)    Calcium 8.8 (*)    ALT 47 (*)    Alkaline Phosphatase 274 (*)    All other components within normal limits  CBC WITH DIFFERENTIAL/PLATELET - Abnormal; Notable for the following components:   WBC 11.2 (*)    RBC 3.36 (*)    Hemoglobin 10.3 (*)    HCT 31.6 (*)    Platelets 601 (*)    Neutro Abs 9.7 (*)    Lymphs Abs 0.6 (*)    Abs Immature Granulocytes 0.13 (*)    All other components within normal limits  LIPASE, BLOOD  LACTIC ACID, PLASMA  URINALYSIS, ROUTINE W REFLEX MICROSCOPIC  LACTIC ACID, PLASMA    EKG None  Radiology CT ABDOMEN PELVIS W CONTRAST  Result Date: 11/25/2022 CLINICAL DATA:  Abdominal wall hernia suspected. Pain. Symptoms for 18 months. Liver  transplant on March 1st. EXAM: CT ABDOMEN AND PELVIS WITH CONTRAST TECHNIQUE: Multidetector CT imaging of the abdomen and pelvis was performed using the standard protocol following bolus administration of intravenous contrast. RADIATION DOSE REDUCTION: This exam was performed according to the departmental dose-optimization program which includes automated exposure control, adjustment of the mA and/or kV according to patient size and/or use of iterative reconstruction technique. CONTRAST:  75mL OMNIPAQUE IOHEXOL 350 MG/ML SOLN COMPARISON:  None Available. FINDINGS: Lower chest: Dependent atelectasis in the lung bases. Minimal pericardial effusion. Hepatobiliary: Surgical clips in the right upper quadrant consistent with history of liver transplantation. No focal liver lesions. Homogeneous appearance of the liver parenchyma. Hepatic veins and portal veins are patent. Gallbladder is surgically absent. No bile duct dilatation. Pancreas: Unremarkable. No pancreatic ductal dilatation or surrounding inflammatory changes. Spleen: Normal in size without focal abnormality. Adrenals/Urinary Tract: Adrenal glands are unremarkable. Kidneys are normal, without renal calculi, focal lesion, or hydronephrosis. Bladder is unremarkable. Stomach/Bowel: Stomach is filled with ingested material. No gastric wall thickening. There is a large periumbilical hernia containing a loop of small bowel as well as fluid. There is proximal small bowel obstruction with dilated fluid-filled proximal small bowel and decompressed distal small bowel. Colon is mostly decompressed with scattered stool. Colonic diverticula without evidence of acute diverticulitis. Appendix is not identified. Mesenteric vessels are patent. No pneumatosis or significant bowel wall thickening. Vascular/Lymphatic: Aortic atherosclerosis. No enlarged abdominal or pelvic lymph nodes. Reproductive: Prostate is unremarkable. Other: Moderate free fluid throughout the abdomen and  pelvis. No free air. Musculoskeletal: No acute or significant osseous findings. IMPRESSION: 1. Large periumbilical hernia containing small bowel with proximal small bowel obstruction due to the hernia. 2. Moderate free fluid throughout the abdomen and pelvis. 3. Minimal pericardial effusion. 4. Aortic atherosclerosis. Electronically Signed   By: Burman Nieves M.D.   On: 11/25/2022 17:42    Procedures .Critical Care  Performed by: Silva Bandy, PA-C Authorized by: Lurena Nida  A, PA-C   Critical care provider statement:    Critical care time (minutes):  30   Critical care start time:  11/25/2022 5:30 PM   Critical care end time:  11/25/2022 6:00 PM   Critical care was necessary to treat or prevent imminent or life-threatening deterioration of the following conditions: incarcerated abdominal hernia with bowel obstruction.   Critical care was time spent personally by me on the following activities:  Development of treatment plan with patient or surrogate, discussions with consultants, discussions with primary provider, evaluation of patient's response to treatment, examination of patient, obtaining history from patient or surrogate, ordering and review of laboratory studies, ordering and review of radiographic studies, pulse oximetry, re-evaluation of patient's condition and review of old charts   Care discussed with: accepting provider at another facility       Medications Ordered in ED Medications  HYDROmorphone (DILAUDID) injection 1 mg (1 mg Intravenous Given 11/25/22 1546)  HYDROmorphone (DILAUDID) injection 1 mg (1 mg Intravenous Given 11/25/22 1702)  iohexol (OMNIPAQUE) 350 MG/ML injection 75 mL (75 mLs Intravenous Contrast Given 11/25/22 1725)  morphine (PF) 4 MG/ML injection 4 mg (4 mg Intravenous Given 11/25/22 1833)    ED Course/ Medical Decision Making/ A&P                             Medical Decision Making Amount and/or Complexity of Data Reviewed Labs: ordered. Radiology:  ordered.  Risk Prescription drug management.   This patient is a 62 y.o. male who presents to the ED for concern of abdominal pain, this involves an extensive number of treatment options, and is a complaint that carries with it a high risk of complications and morbidity. The emergent differential diagnosis prior to evaluation includes, but is not limited to,  incarcerated hernia, strangulated hernia . This is not an exhaustive differential.   Past Medical History / Co-morbidities / Social History: history of hypertension, T2DM, alcoholic liver disease status post transplant at [facility] on 3/1   Additional history: Chart reviewed. Pertinent results include: sees transplant team at [facility], post transplant on 3/1  Physical Exam: Physical exam performed. The pertinent findings include: Discolored and hard to touch abdominal hernia with significant tenderness  Lab Tests: I ordered, and personally interpreted labs.  The pertinent results include:  WBC 11.2, hgb 10.3. Creatinine 1.28 consistent with previous. ALT 47, Alk phos 274. Lactic WNL   Imaging Studies: I ordered imaging studies including CT abdomen pelvis. I independently visualized and interpreted imaging which showed   1. Large periumbilical hernia containing small bowel with proximal small bowel obstruction due to the hernia. 2. Moderate free fluid throughout the abdomen and pelvis. 3. Minimal pericardial effusion. 4. Aortic atherosclerosis.  I agree with the radiologist interpretation.   Medications: I ordered medication including dilaudid, morphine  for pain. Reevaluation of the patient after these medicines showed that the patient improved. I have reviewed the patients home medicines and have made adjustments as needed.  Consultations Obtained: I requested consultation with the [facility] Hepatologist Dr. April Wall,  and discussed lab and imaging findings as well as pertinent plan - they recommend: they accept patient for transfer to  [facility]   Disposition:  Patient presents today with worsening pain in abdominal wall hernia. CT imaging shows incarcerated hernia with obstruction. Discussed with [facility] given recent liver transplant and they have accepted him for transfer. Plan was to place NG tube prior to transport, however patient adamantly  refused this procedure. He was informed that the risk of refusing  could lead to, but was not limited to, death, permanent disability, or severe pain. I asked the relatives or significant others to dissuade them without success. Prior to refusing, I determined that the patient had the capacity to make their decision and understood the consequences of that decision. Patient agreed to transport via [facility] to [facility] for further management of his hernia. He was hemodynamically stable for transport.   This is a shared visit with supervising physician Dr. Suezanne Jacquet who has independently evaluated patient & provided guidance in evaluation/management/disposition, in agreement with care    Final Clinical Impression(s) / ED Diagnoses Final diagnoses:  Periumbilical hernia  Abdominal hernia with obstruction    Rx / DC Orders ED Discharge Orders     None         Vear Clock 11/25/22 2003    Lonell Grandchild, MD 11/25/22 [phone]

## 2022-11-25 NOTE — ED Provider Notes (Incomplete)
[place] EMERGENCY DEPARTMENT AT [facility] Provider Note   CSN: [id number] Arrival date & time: 11/25/22  1510     History {Add pertinent medical, surgical, social history, OB history to HPI:1} Chief Complaint  Patient presents with  . Hernia    Andrew Patton is a 62 y.o. male.  Patient with history of hypertension, T2DM, alcoholic liver disease status post transplant at [facility] on 3/1 presents today with complaints of abdominal hernia pain. He states that same began yesterday when he was having a bowel movement and has been persistent since. States when he woke up this morning his pain was out of control despite taking home narcotics. Also states that the area overlying his skin has become progressively more blue since yesterday as well. He called his transplant team and sent them pictures and they recommended that he come to [facility] for evaluation, however he did not feel like he could make it all the way to [facility] with how severe his pain was and therefore was recommended that he come here for pain control and evaluation and then be transported from here to [facility]. Patient denies any fevers, chills, nausea, vomiting, or diarrhea. He was told that he needed to wait until 6 months post op to have his hernia repaired. He also reportedly had a paracentesis yesterday to try and relieve pressure around his hernia.   The history is provided by the patient. No language interpreter was used.       Home Medications Prior to Admission medications   Medication Sig Start Date End Date Taking? Authorizing Provider  busPIRone (BUSPAR) 15 MG tablet Take 7.5-15 mg by mouth 2 (two) times daily. Take 1/2 tablet (7.5 mg) in the morning and Take 1 tablet (15 mg) at supper    [provider]  cetirizine (ZYRTEC) 5 MG chewable tablet Chew 5 mg by mouth daily. Patient not taking: Reported on 09/21/2022    [provider]  clobetasol cream (TEMOVATE) 0.05 % Apply topically. 10/21/20    [provider]  clonazePAM (KLONOPIN) 0.5 MG tablet Take 0.5 mg by mouth at bedtime. Patient not taking: Reported on 09/21/2022    [provider]  colesevelam (WELCHOL) 625 MG tablet Take 625 mg by mouth daily with breakfast.    [provider]  Emollient (AQUAPHOR ADV THERAPY HEALING) OINT Place 1 Application into left nostril 2 (two) times daily.    [provider]  escitalopram (LEXAPRO) 5 MG tablet Take 5 mg by mouth daily. 02/14/22   [provider]  ezetimibe (ZETIA) 10 MG tablet Take 10 mg by mouth daily.    [provider]  folic acid (FOLVITE) 1 MG tablet Take 1 mg by mouth at bedtime.    [provider]  furosemide (LASIX) 20 MG tablet Take 1 tablet (20 mg total) by mouth 2 (two) times daily. 07/08/22   Marinda Elk, MD  gabapentin (NEURONTIN) 100 MG capsule Take by mouth. 08/11/22 08/11/23  [provider]  hydrocortisone (CORTEF) 10 MG tablet Take 1 tablet (10 mg total) by mouth 2 (two) times daily. Patient not taking: Reported on 09/21/2022 07/08/22   Marinda Elk, MD  lactulose (CHRONULAC) 10 GM/15ML solution Take 15 mLs (10 g total) by mouth 2 (two) times daily as needed for mild constipation (for bowel movments 2-3 per day). Patient taking differently: Take 10 g by mouth 2 (two) times daily as needed for mild constipation (for bowel movments 2-3 per day). Pt gives usually  between 7- 10 mls as needed 07/06/21   Pokhrel, Rebekah Chesterfield, MD  levETIRAcetam (KEPPRA XR) 500 MG 24 hr tablet Take 2 tablets (1,000 mg total) by mouth daily. Patient taking differently: Take 1,000 mg by mouth at bedtime. 12/10/21   Anson Fret, MD  levothyroxine (SYNTHROID) 88 MCG tablet Take 88 mcg by mouth every morning. 02/14/22   [provider]  midodrine (PROAMATINE) 5 MG tablet Take 3 tablets (15 mg total) by mouth 3 (three) times daily with meals. 07/08/22   Marinda Elk, MD  Multiple Vitamin (MULTIVITAMIN PO)  Take by mouth.    [provider]  naloxone [facility]) nasal spray 4 mg/0.1 mL Place into the nose. 07/14/22   [provider]  ofloxacin (OCUFLOX) 0.3 % ophthalmic solution SMARTSIG:In Eye(s) 08/31/22   [provider]  ondansetron (ZOFRAN ODT) 4 MG disintegrating tablet Take 1 tablet (4 mg total) by mouth every 8 (eight) hours as needed for nausea or vomiting. Patient taking differently: Take 4 mg by mouth every 8 (eight) hours as needed for nausea or vomiting (dissolve orally). 02/08/20   Maxwell Caul, PA-C  oxyCODONE (OXY IR/ROXICODONE) 5 MG immediate release tablet Take 5 mg by mouth 4 (four) times daily as needed.    [provider]  pantoprazole (PROTONIX) 20 MG tablet Take 1 tablet (20 mg total) by mouth daily. 08/16/22   Cirigliano, Vito V, DO  prednisoLONE acetate (PRED FORTE) 1 % ophthalmic suspension Place into the left eye. 08/31/22   [provider]  rifaximin (XIFAXAN) 550 MG TABS tablet Take 1 tablet (550 mg total) by mouth 2 (two) times daily. 09/21/22   Cirigliano, Vito V, DO  simethicone (MYLICON) 125 MG chewable tablet Chew by mouth.    [provider]  sodium chloride (SALINE MIST) 0.65 % nasal spray Place 1 spray into the nose 3 (three) times daily.    [provider]  spironolactone (ALDACTONE) 25 MG tablet Take 1 tablet by mouth daily. 08/25/22 08/25/23  [provider]  thiamine 100 MG tablet Take 1 tablet (100 mg total) by mouth daily. 07/06/21   Pokhrel, Rebekah Chesterfield, MD  zinc sulfate 220 (50 Zn) MG capsule Take by mouth. 06/04/22 06/04/23  [provider]      Allergies    Fentanyl, Meloxicam, Rosuvastatin, Wound dressing adhesive, Ciprofloxacin, and Penicillins    Review of Systems   Review of Systems  Gastrointestinal:  Positive for abdominal pain.  All other systems reviewed and are negative.   Physical Exam Updated Vital Signs BP (!) 151/78 (BP Location: Right Arm)   Pulse 60   Temp 97.7 F  (36.5 C) (Oral)   Resp 16   Ht 5\' 6"  (1.676 m)   Wt 68 kg   SpO2 98%   BMI 24.20 kg/m  Physical Exam Vitals and nursing note reviewed.  Constitutional:      General: He is not in acute distress.    Appearance: Normal appearance. He is normal weight. He is not ill-appearing, toxic-appearing or diaphoretic.  HENT:     Head: Normocephalic and atraumatic.  Cardiovascular:     Rate and Rhythm: Normal rate.  Pulmonary:     Effort: Pulmonary effort is normal. No respiratory distress.  Abdominal:     Comments: Large discolored abdominal hernia that his hard to the touch and extremely tender to palpation. Unable to reduce. See images below for further  Musculoskeletal:        General: Normal range of motion.  Cervical back: Normal range of motion.  Skin:    General: Skin is warm and dry.  Neurological:     General: No focal deficit present.     Mental Status: He is alert.  Psychiatric:        Mood and Affect: Mood normal.        Behavior: Behavior normal.     ED Results / Procedures / Treatments   Labs (all labs ordered are listed, but only abnormal results are displayed) Labs Reviewed  COMPREHENSIVE METABOLIC PANEL - Abnormal; Notable for the following components:      Result Value   Glucose, Bld 124 (*)    Creatinine, Ser 1.28 (*)    Calcium 8.8 (*)    ALT 47 (*)    Alkaline Phosphatase 274 (*)    All other components within normal limits  CBC WITH DIFFERENTIAL/PLATELET - Abnormal; Notable for the following components:   WBC 11.2 (*)    RBC 3.36 (*)    Hemoglobin 10.3 (*)    HCT 31.6 (*)    Platelets 601 (*)    Neutro Abs 9.7 (*)    Lymphs Abs 0.6 (*)    Abs Immature Granulocytes 0.13 (*)    All other components within normal limits  LIPASE, BLOOD  LACTIC ACID, PLASMA  URINALYSIS, ROUTINE W REFLEX MICROSCOPIC  LACTIC ACID, PLASMA    EKG None  Radiology CT ABDOMEN PELVIS W CONTRAST  Result Date: 11/25/2022 CLINICAL DATA:  Abdominal wall hernia  suspected. Pain. Symptoms for 18 months. Liver transplant on March 1st. EXAM: CT ABDOMEN AND PELVIS WITH CONTRAST TECHNIQUE: Multidetector CT imaging of the abdomen and pelvis was performed using the standard protocol following bolus administration of intravenous contrast. RADIATION DOSE REDUCTION: This exam was performed according to the departmental dose-optimization program which includes automated exposure control, adjustment of the mA and/or kV according to patient size and/or use of iterative reconstruction technique. CONTRAST:  75mL OMNIPAQUE IOHEXOL 350 MG/ML SOLN COMPARISON:  None Available. FINDINGS: Lower chest: Dependent atelectasis in the lung bases. Minimal pericardial effusion. Hepatobiliary: Surgical clips in the right upper quadrant consistent with history of liver transplantation. No focal liver lesions. Homogeneous appearance of the liver parenchyma. Hepatic veins and portal veins are patent. Gallbladder is surgically absent. No bile duct dilatation. Pancreas: Unremarkable. No pancreatic ductal dilatation or surrounding inflammatory changes. Spleen: Normal in size without focal abnormality. Adrenals/Urinary Tract: Adrenal glands are unremarkable. Kidneys are normal, without renal calculi, focal lesion, or hydronephrosis. Bladder is unremarkable. Stomach/Bowel: Stomach is filled with ingested material. No gastric wall thickening. There is a large periumbilical hernia containing a loop of small bowel as well as fluid. There is proximal small bowel obstruction with dilated fluid-filled proximal small bowel and decompressed distal small bowel. Colon is mostly decompressed with scattered stool. Colonic diverticula without evidence of acute diverticulitis. Appendix is not identified. Mesenteric vessels are patent. No pneumatosis or significant bowel wall thickening. Vascular/Lymphatic: Aortic atherosclerosis. No enlarged abdominal or pelvic lymph nodes. Reproductive: Prostate is unremarkable. Other:  Moderate free fluid throughout the abdomen and pelvis. No free air. Musculoskeletal: No acute or significant osseous findings. IMPRESSION: 1. Large periumbilical hernia containing small bowel with proximal small bowel obstruction due to the hernia. 2. Moderate free fluid throughout the abdomen and pelvis. 3. Minimal pericardial effusion. 4. Aortic atherosclerosis. Electronically Signed   By: Burman Nieves M.D.   On: 11/25/2022 17:42    Procedures Procedures  {Document cardiac monitor, telemetry assessment procedure when appropriate:1}  Medications Ordered  in ED Medications  HYDROmorphone (DILAUDID) injection 1 mg (has no administration in time range)    ED Course/ Medical Decision Making/ A&P   {   Click here for ABCD2, HEART and other calculatorsREFRESH Note before signing :1}                          Medical Decision Making Amount and/or Complexity of Data Reviewed Labs: ordered. Radiology: ordered.  Risk Prescription drug management.   ***  {Document critical care time when appropriate:1} {Document review of labs and clinical decision tools ie heart score, Chads2Vasc2 etc:1}  {Document your independent review of radiology images, and any outside records:1} {Document your discussion with family members, caretakers, and with consultants:1} {Document social determinants of health affecting pt's care:1} {Document your decision making why or why not admission, treatments were needed:1} Final Clinical Impression(s) / ED Diagnoses Final diagnoses:  None    Rx / DC Orders ED Discharge Orders     None

## 2022-11-25 NOTE — ED Triage Notes (Signed)
Patient BIB EMS from home due to hernia pain. Patient has had for 18 months. Patient states he had a liver transplant March 1st and can't have surgery till 6 mo post op. Pain increased when going to bathroom today. Patient A&Ox4. 170/88 HR:64 O2:98 CBG:126

## 2022-11-25 NOTE — ED Notes (Signed)
Will attempting to place NG pt got agitated and refused the NG placement at this time he states if he gets to [facility] and they want it placed he will do it.  He verbalizes the risks of death and benefits of having the NG placed

## 2022-11-25 NOTE — ED Notes (Signed)
[facility] called for emergent transfer to [facility] ER to ER

## 2022-12-02 MED FILL — Morphine Sulfate Inj 4 MG/ML: INTRAMUSCULAR | Qty: 1 | Status: AC

## 2022-12-07 ENCOUNTER — Other Ambulatory Visit (HOSPITAL_COMMUNITY): Payer: Self-pay

## 2022-12-13 ENCOUNTER — Other Ambulatory Visit (HOSPITAL_COMMUNITY)
Admission: AD | Admit: 2022-12-13 | Discharge: 2022-12-13 | Disposition: A | Discharge status: Home / Self Care | Payer: Medicare PPO | Source: Ambulatory Visit | Attending: General Surgery | Admitting: General Surgery

## 2022-12-13 DIAGNOSIS — K7031 Alcoholic cirrhosis of liver with ascites: Secondary | ICD-10-CM | POA: Diagnosis not present

## 2022-12-13 DIAGNOSIS — K7460 Unspecified cirrhosis of liver: Secondary | ICD-10-CM | POA: Insufficient documentation

## 2022-12-13 DIAGNOSIS — Z944 Liver transplant status: Secondary | ICD-10-CM | POA: Insufficient documentation

## 2022-12-13 DIAGNOSIS — K420 Umbilical hernia with obstruction, without gangrene: Secondary | ICD-10-CM | POA: Insufficient documentation

## 2022-12-13 DIAGNOSIS — K429 Umbilical hernia without obstruction or gangrene: Secondary | ICD-10-CM | POA: Diagnosis present

## 2022-12-13 LAB — CBC WITH DIFFERENTIAL/PLATELET
Abs Immature Granulocytes: 0.22 10*3/uL — ABNORMAL HIGH (ref 0.00–0.07)
Basophils Absolute: 0.1 10*3/uL (ref 0.0–0.1)
Basophils Relative: 1 %
Eosinophils Absolute: 0.1 10*3/uL (ref 0.0–0.5)
Eosinophils Relative: 2 %
HCT: 28 % — ABNORMAL LOW (ref 39.0–52.0)
Hemoglobin: 8.8 g/dL — ABNORMAL LOW (ref 13.0–17.0)
Immature Granulocytes: 4 %
Lymphocytes Relative: 21 %
Lymphs Abs: 1.3 10*3/uL (ref 0.7–4.0)
MCH: 29.3 pg (ref 26.0–34.0)
MCHC: 31.4 g/dL (ref 30.0–36.0)
MCV: 93.3 fL (ref 80.0–100.0)
Monocytes Absolute: 0.6 10*3/uL (ref 0.1–1.0)
Monocytes Relative: 10 %
Neutro Abs: 3.8 10*3/uL (ref 1.7–7.7)
Neutrophils Relative %: 62 %
Platelets: 591 10*3/uL — ABNORMAL HIGH (ref 150–400)
RBC: 3 MIL/uL — ABNORMAL LOW (ref 4.22–5.81)
RDW: 14.3 % (ref 11.5–15.5)
WBC: 6 10*3/uL (ref 4.0–10.5)
nRBC: 0 % (ref 0.0–0.2)

## 2022-12-13 LAB — COMPREHENSIVE METABOLIC PANEL
ALT: 33 U/L (ref 0–44)
AST: 28 U/L (ref 15–41)
Albumin: 3.1 g/dL — ABNORMAL LOW (ref 3.5–5.0)
Alkaline Phosphatase: 271 U/L — ABNORMAL HIGH (ref 38–126)
Anion gap: 9 (ref 5–15)
BUN: 25 mg/dL — ABNORMAL HIGH (ref 8–23)
CO2: 27 mmol/L (ref 22–32)
Calcium: 8.8 mg/dL — ABNORMAL LOW (ref 8.9–10.3)
Chloride: 103 mmol/L (ref 98–111)
Creatinine, Ser: 1.02 mg/dL (ref 0.61–1.24)
GFR, Estimated: >60 mL/min (ref >60)
Glucose, Bld: 124 mg/dL — ABNORMAL HIGH (ref 70–99)
Potassium: 4.7 mmol/L (ref 3.5–5.1)
Sodium: 139 mmol/L (ref 135–145)
Total Bilirubin: 0.1 mg/dL — ABNORMAL LOW (ref 0.3–1.2)
Total Protein: 6 g/dL — ABNORMAL LOW (ref 6.5–8.1)

## 2022-12-13 LAB — MAGNESIUM: Magnesium: 1.6 mg/dL — ABNORMAL LOW (ref 1.7–2.4)

## 2022-12-14 LAB — TACROLIMUS LEVEL: Tacrolimus (FK506) - LabCorp: 6.4 ng/mL (ref 2.0–20.0)

## 2022-12-17 LAB — MISC LABCORP TEST (SEND OUT): Labcorp test code: 791584

## 2022-12-20 ENCOUNTER — Other Ambulatory Visit (HOSPITAL_COMMUNITY)
Admission: AD | Admit: 2022-12-20 | Discharge: 2022-12-20 | Disposition: A | Discharge status: Home / Self Care | Payer: Medicare PPO | Source: Ambulatory Visit | Attending: General Surgery | Admitting: General Surgery

## 2022-12-20 DIAGNOSIS — K7031 Alcoholic cirrhosis of liver with ascites: Secondary | ICD-10-CM | POA: Insufficient documentation

## 2022-12-20 DIAGNOSIS — D849 Immunodeficiency, unspecified: Secondary | ICD-10-CM | POA: Diagnosis not present

## 2022-12-20 DIAGNOSIS — Z944 Liver transplant status: Secondary | ICD-10-CM | POA: Insufficient documentation

## 2022-12-20 LAB — CBC WITH DIFFERENTIAL/PLATELET
Abs Immature Granulocytes: 0.24 10*3/uL — ABNORMAL HIGH (ref 0.00–0.07)
Basophils Absolute: 0.1 10*3/uL (ref 0.0–0.1)
Basophils Relative: 1 %
Eosinophils Absolute: 0.1 10*3/uL (ref 0.0–0.5)
Eosinophils Relative: 1 %
HCT: 29.3 % — ABNORMAL LOW (ref 39.0–52.0)
Hemoglobin: 9 g/dL — ABNORMAL LOW (ref 13.0–17.0)
Immature Granulocytes: 5 %
Lymphocytes Relative: 25 %
Lymphs Abs: 1.3 10*3/uL (ref 0.7–4.0)
MCH: 28.4 pg (ref 26.0–34.0)
MCHC: 30.7 g/dL (ref 30.0–36.0)
MCV: 92.4 fL (ref 80.0–100.0)
Monocytes Absolute: 0.4 10*3/uL (ref 0.1–1.0)
Monocytes Relative: 7 %
Neutro Abs: 3.1 10*3/uL (ref 1.7–7.7)
Neutrophils Relative %: 61 %
Platelets: 490 10*3/uL — ABNORMAL HIGH (ref 150–400)
RBC: 3.17 MIL/uL — ABNORMAL LOW (ref 4.22–5.81)
RDW: 13.8 % (ref 11.5–15.5)
WBC: 5 10*3/uL (ref 4.0–10.5)
nRBC: 0 % (ref 0.0–0.2)

## 2022-12-20 LAB — COMPREHENSIVE METABOLIC PANEL
ALT: 44 U/L (ref 0–44)
AST: 33 U/L (ref 15–41)
Albumin: 3.1 g/dL — ABNORMAL LOW (ref 3.5–5.0)
Alkaline Phosphatase: 296 U/L — ABNORMAL HIGH (ref 38–126)
Anion gap: 8 (ref 5–15)
BUN: 28 mg/dL — ABNORMAL HIGH (ref 8–23)
CO2: 27 mmol/L (ref 22–32)
Calcium: 8.5 mg/dL — ABNORMAL LOW (ref 8.9–10.3)
Chloride: 103 mmol/L (ref 98–111)
Creatinine, Ser: 1.14 mg/dL (ref 0.61–1.24)
GFR, Estimated: >60 mL/min (ref >60)
Glucose, Bld: 125 mg/dL — ABNORMAL HIGH (ref 70–99)
Potassium: 4.4 mmol/L (ref 3.5–5.1)
Sodium: 138 mmol/L (ref 135–145)
Total Bilirubin: <0.1 mg/dL — ABNORMAL LOW (ref 0.3–1.2)
Total Protein: 5.8 g/dL — ABNORMAL LOW (ref 6.5–8.1)

## 2022-12-20 LAB — MAGNESIUM: Magnesium: 1.8 mg/dL (ref 1.7–2.4)

## 2022-12-22 LAB — TACROLIMUS LEVEL: Tacrolimus (FK506) - LabCorp: 7.7 ng/mL (ref 2.0–20.0)

## 2022-12-27 LAB — MISC LABCORP TEST (SEND OUT): Labcorp test code: 791584

## 2022-12-28 ENCOUNTER — Other Ambulatory Visit (HOSPITAL_COMMUNITY)
Admission: RE | Admit: 2022-12-28 | Discharge: 2022-12-28 | Disposition: A | Discharge status: Home / Self Care | Payer: Medicare PPO | Source: Ambulatory Visit | Attending: General Surgery | Admitting: General Surgery

## 2022-12-28 DIAGNOSIS — Z944 Liver transplant status: Secondary | ICD-10-CM | POA: Diagnosis present

## 2022-12-28 DIAGNOSIS — D849 Immunodeficiency, unspecified: Secondary | ICD-10-CM | POA: Diagnosis not present

## 2022-12-28 DIAGNOSIS — Z5181 Encounter for therapeutic drug level monitoring: Secondary | ICD-10-CM | POA: Insufficient documentation

## 2022-12-28 LAB — CBC WITH DIFFERENTIAL/PLATELET
Abs Immature Granulocytes: 0.08 10*3/uL — ABNORMAL HIGH (ref 0.00–0.07)
Basophils Absolute: 0 10*3/uL (ref 0.0–0.1)
Basophils Relative: 1 %
Eosinophils Absolute: 0.1 10*3/uL (ref 0.0–0.5)
Eosinophils Relative: 3 %
HCT: 30.2 % — ABNORMAL LOW (ref 39.0–52.0)
Hemoglobin: 9.2 g/dL — ABNORMAL LOW (ref 13.0–17.0)
Immature Granulocytes: 2 %
Lymphocytes Relative: 22 %
Lymphs Abs: 0.9 10*3/uL (ref 0.7–4.0)
MCH: 28.1 pg (ref 26.0–34.0)
MCHC: 30.5 g/dL (ref 30.0–36.0)
MCV: 92.4 fL (ref 80.0–100.0)
Monocytes Absolute: 0.2 10*3/uL (ref 0.1–1.0)
Monocytes Relative: 4 %
Neutro Abs: 3 10*3/uL (ref 1.7–7.7)
Neutrophils Relative %: 68 %
Platelets: 334 10*3/uL (ref 150–400)
RBC: 3.27 MIL/uL — ABNORMAL LOW (ref 4.22–5.81)
RDW: 14 % (ref 11.5–15.5)
WBC: 4.3 10*3/uL (ref 4.0–10.5)
nRBC: 0 % (ref 0.0–0.2)

## 2022-12-28 LAB — COMPREHENSIVE METABOLIC PANEL
ALT: 48 U/L — ABNORMAL HIGH (ref 0–44)
AST: 36 U/L (ref 15–41)
Albumin: 3.3 g/dL — ABNORMAL LOW (ref 3.5–5.0)
Alkaline Phosphatase: 332 U/L — ABNORMAL HIGH (ref 38–126)
Anion gap: 9 (ref 5–15)
BUN: 33 mg/dL — ABNORMAL HIGH (ref 8–23)
CO2: 26 mmol/L (ref 22–32)
Calcium: 8 mg/dL — ABNORMAL LOW (ref 8.9–10.3)
Chloride: 104 mmol/L (ref 98–111)
Creatinine, Ser: 1.48 mg/dL — ABNORMAL HIGH (ref 0.61–1.24)
GFR, Estimated: 53 mL/min — ABNORMAL LOW (ref >60)
Glucose, Bld: 195 mg/dL — ABNORMAL HIGH (ref 70–99)
Potassium: 4 mmol/L (ref 3.5–5.1)
Sodium: 139 mmol/L (ref 135–145)
Total Bilirubin: 0.1 mg/dL — ABNORMAL LOW (ref 0.3–1.2)
Total Protein: 6.1 g/dL — ABNORMAL LOW (ref 6.5–8.1)

## 2022-12-28 LAB — MAGNESIUM: Magnesium: 1.9 mg/dL (ref 1.7–2.4)

## 2022-12-30 LAB — TACROLIMUS LEVEL: Tacrolimus (FK506) - LabCorp: 4.4 ng/mL (ref 2.0–20.0)

## 2023-01-02 LAB — MISC LABCORP TEST (SEND OUT): Labcorp test code: 791584

## 2023-01-07 ENCOUNTER — Other Ambulatory Visit (HOSPITAL_COMMUNITY)
Admission: AD | Admit: 2023-01-07 | Discharge: 2023-01-07 | Disposition: A | Discharge status: Home / Self Care | Payer: Medicare PPO | Source: Ambulatory Visit | Attending: Neurology | Admitting: Neurology

## 2023-01-07 DIAGNOSIS — K7031 Alcoholic cirrhosis of liver with ascites: Secondary | ICD-10-CM | POA: Insufficient documentation

## 2023-01-07 DIAGNOSIS — Z944 Liver transplant status: Secondary | ICD-10-CM | POA: Diagnosis present

## 2023-01-07 DIAGNOSIS — D849 Immunodeficiency, unspecified: Secondary | ICD-10-CM | POA: Insufficient documentation

## 2023-01-07 LAB — COMPREHENSIVE METABOLIC PANEL
ALT: 53 U/L — ABNORMAL HIGH (ref 0–44)
AST: 33 U/L (ref 15–41)
Albumin: 3.2 g/dL — ABNORMAL LOW (ref 3.5–5.0)
Alkaline Phosphatase: 297 U/L — ABNORMAL HIGH (ref 38–126)
Anion gap: 7 (ref 5–15)
BUN: 23 mg/dL (ref 8–23)
CO2: 27 mmol/L (ref 22–32)
Calcium: 8.7 mg/dL — ABNORMAL LOW (ref 8.9–10.3)
Chloride: 105 mmol/L (ref 98–111)
Creatinine, Ser: 1.22 mg/dL (ref 0.61–1.24)
GFR, Estimated: >60 mL/min (ref >60)
Glucose, Bld: 206 mg/dL — ABNORMAL HIGH (ref 70–99)
Potassium: 3.8 mmol/L (ref 3.5–5.1)
Sodium: 139 mmol/L (ref 135–145)
Total Bilirubin: 0.3 mg/dL (ref 0.3–1.2)
Total Protein: 5.9 g/dL — ABNORMAL LOW (ref 6.5–8.1)

## 2023-01-07 LAB — CBC WITH DIFFERENTIAL/PLATELET
Abs Immature Granulocytes: 0.04 10*3/uL (ref 0.00–0.07)
Basophils Absolute: 0 10*3/uL (ref 0.0–0.1)
Basophils Relative: 1 %
Eosinophils Absolute: 0.1 10*3/uL (ref 0.0–0.5)
Eosinophils Relative: 3 %
HCT: 30.7 % — ABNORMAL LOW (ref 39.0–52.0)
Hemoglobin: 9.4 g/dL — ABNORMAL LOW (ref 13.0–17.0)
Immature Granulocytes: 1 %
Lymphocytes Relative: 22 %
Lymphs Abs: 0.7 10*3/uL (ref 0.7–4.0)
MCH: 28.4 pg (ref 26.0–34.0)
MCHC: 30.6 g/dL (ref 30.0–36.0)
MCV: 92.7 fL (ref 80.0–100.0)
Monocytes Absolute: 0.2 10*3/uL (ref 0.1–1.0)
Monocytes Relative: 5 %
Neutro Abs: 2.3 10*3/uL (ref 1.7–7.7)
Neutrophils Relative %: 68 %
Platelets: 383 10*3/uL (ref 150–400)
RBC: 3.31 MIL/uL — ABNORMAL LOW (ref 4.22–5.81)
RDW: 14.6 % (ref 11.5–15.5)
WBC: 3.3 10*3/uL — ABNORMAL LOW (ref 4.0–10.5)
nRBC: 0 % (ref 0.0–0.2)

## 2023-01-07 LAB — MAGNESIUM: Magnesium: 1.7 mg/dL (ref 1.7–2.4)

## 2023-01-10 LAB — TACROLIMUS LEVEL: Tacrolimus (FK506) - LabCorp: 5 ng/mL (ref 2.0–20.0)

## 2023-01-12 LAB — MISC LABCORP TEST (SEND OUT)
Labcorp test code: 791584
Source (LabCorp): 1

## 2023-01-24 ENCOUNTER — Telehealth: Payer: Self-pay | Admitting: Neurology

## 2023-01-24 NOTE — Telephone Encounter (Signed)
LVM and sent mychart msg informing pt of 6/26 appt r/s needed- MD in meeting.

## 2023-01-25 ENCOUNTER — Encounter: Payer: Self-pay | Admitting: Neurology

## 2023-01-25 ENCOUNTER — Ambulatory Visit: Payer: Medicare PPO | Admitting: Neurology

## 2023-01-25 VITALS — BP 91/51 | HR 77 | Ht 66.0 in | Wt 155.0 lb

## 2023-01-25 DIAGNOSIS — K7031 Alcoholic cirrhosis of liver with ascites: Secondary | ICD-10-CM

## 2023-01-25 DIAGNOSIS — G40209 Localization-related (focal) (partial) symptomatic epilepsy and epileptic syndromes with complex partial seizures, not intractable, without status epilepticus: Secondary | ICD-10-CM

## 2023-01-25 MED ORDER — ESCITALOPRAM OXALATE 5 MG PO TABS: 5.0000 mg | ORAL_TABLET | Freq: Every day | ORAL | 4 refills | Status: DC

## 2023-01-25 MED ORDER — LEVETIRACETAM ER 500 MG PO TB24: 1500.0000 mg | ORAL_TABLET | Freq: Every day | ORAL | 4 refills | Status: DC

## 2023-01-25 MED ORDER — BUSPIRONE HCL 15 MG PO TABS: ORAL_TABLET | ORAL | 4 refills | Status: DC

## 2023-01-25 NOTE — Progress Notes (Signed)
PATIENT: Andrew Patton DOB: 1960-10-11  REASON FOR VISIT: follow up HISTORY FROM: patient PRIMARY NEUROLOGIST: Dr. Lucia Gaskins   seizures  HISTORY OF PRESENT ILLNESS: 01/25/2023: Patient with seizures and recent liver transplant. Returns today for follow up. He is on tacrolimus and Keppra was increased to 1500XR. He is doing great after the liver transport, then had emergency surgery for hernia. Eveythng is fine, no seizures, doing well on 1500mg  of keppra. Would keep him on medication one year and then consider decreasing or stopping, seizures were likely due to elevated ammonia and liver disease.  Patient complains of symptoms per HPI as well as the following symptoms: none . Pertinent negatives and positives per HPI. All others negative    Today 09/16/2022: Andrew Patton is a 62 y.o. male with a history of cognitive deficits and seizures. Returns today for follow-up. Continues to have short term memory issues.   In February had an episode where he blanked and lost track of where he was but as soon as his wife speaks to him and then he typically will get back to normal. Reports that last night he was a little off according to his wife but he had a long day d/t paracentesis.  His wife states that these episodes are not the same as his seizure events.  She feels that it is related to his liver and everything he has going on currently.  Patient will be having a liver transplant due to cirrhosis of the liver.  He will be placed on tacrolimus.  Dr. Lucia Gaskins spoke to his surgeon and Keppra XR will be increased to 1500 mg at bedtime when he is started on this medication.   06/17/22: Andrew Patton is a 62 y.o. male who has been followed in this office for cognitive deficits and seizures. Returns today for follow-up. Wife feels that he has had mild "seizure events." She describes as fogginess (dazed look). Last for seconds- wife states that when she speaks to him he comes out of this. Wife feels that  this is due to recent liver issues.   [name] and broke his nose 9/15. Last night he slipped in the tub and hit his head. Not on any blood thinners. Patient wife reports that she has not noticed any residual symptoms after he fell.  Recent infection was in hospital for 2 weeks. Had peritonitis. Now on Batrim.  Patient's wife feels that he is slowly getting back to his baseline.  Still notices trouble with memory. Wife helps with ADLs due to physical limitations. Wife helps with appointments and medications. Not on any memory medication.  Wife feels that his cognition worsened after his hospitalization but is slowly getting better.  HISTORY Dec 10, 2021: Patient is here for follow-up after formal neurocognitive testing per Dr. Tamsen Snider.  I reviewed his notes as below.  Luckily there is no indication of Alzheimer's or Lewy body dementia or other neurodegenerative dementia.  However he does have significant cognitive deficits.  We discussed conservative measures and most of all managing his medical conditions which she is doing very well.  I answered all the questions as to the best of my ability.  He was here with his wife who also provided much information.  Discussed returning to primary care, patient and wife would like to continue following with [name], we will continue to follow him every 6 months for the next year to ensure his stability.   Dr. Kieth Brightly: Impression/Diagnosis:  The results of the current neuropsychological evaluation are consistent between the patient's reports and his wife reports about significant cognitive difficulties.  However, the obtained scores were significantly below and worse than the descriptions provided by the patient and his wife.  The patient was so severely impaired during the objective assessment that it would have been very difficult for him to manage even basic ADL type functions around his house.  I do think that it is difficult to assess  effectively areas outside of those primarily related to attentional deficits particularly for auditory and visual encoding and focus execute capacity.  These are so impaired that they had a significant impact on all other cognitive areas assessed.  The patient does clearly have significant cognitive deficits with significant memory and attentional deficits present.  This pattern would be consistent with postconcussion syndrome types of deficits but they are will be on what would be expected typically from a concussive event and isolation.  As far as diagnostic considerations, the patient presents with a very complicated medical and neuropsychological picture.  The patient has had multiple concussive events going back over 3 decades.  However, the most changes were noted after a significant fall in September 2021.  The patient began having seizure-like events that were consistent with complex partial type seizures.  He has responded very well to Keppra as far as reducing these events.  He would have acute confusion, disorientation and balance issues during these times.  While neuro imaging including MRIs did not show any observable abnormalities I suspect that the patient has had some degree of diffuse axonal injury as his cognitive deficits go well beyond those typically seen with lobar white matter injury slowly.  I do, however, think that there are other issues going on that are contributing and worsening his symptoms although some of which we are not going to be able to avoid.  The patient has responded very well to Keppra and this is decreased his acute seizure/confusional events.  However, due to his anxiety and depressive symptomatology and sleep difficulties he has continued to take benzo diazepam such as Ambien, clonazepam as well as Keppra for his seizures.  The patient has longstanding sleep disturbance but was not found to have sleep apnea.  During one of his acute events at the winery he had an  elevated blood glucose level of 242 which is significant although I suspect that his baseline is relatively high to begin with.  Thyroid issues, chronic insomnia, treatment of his anxiety with benzo diazepam, seizures and the focal injury or location contributing to the seizure events are all likely producing the primary complex of symptoms resulting in his subjective experiences.  The primary area of cognitive deficits around attention and focus execute abilities would suggest primary frontal lobe involvement and his deficits in the degree of other areas including temporal lobe functioning are very difficult to ascertain as his attentional deficits are so significant profound that almost all other neuropsychological measures are going to be disproportionately and significantly impacted due to his difficulty keeping instructions and directions straight during various tasks.  As far as treatment recommendations it will be important for the patient to continue to work on his depression and anxiety.  He should also continue close monitoring on metabolic issues including his diabetes and thyroid issues and overall liver function etc.  Whenever possible I would suggest he limit his benzo diazepam use if possible as they may exacerbate some of his memory and attentional issues.  While clearly  his Keppra use is not the cause of his overall cognitive dysfunction and is essential for managing his seizure activity it may also be playing some role in his cognitive issues but his attentional and memory deficits are so significant that they could not be explained by typical side effects from Keppra.   I will sit down with the patient and go over all the results of the current neuropsychological evaluation.  We will address issues related to how to better compensate for his significant attentional issues and encoding deficits and explain how these areas of cognitive functioning are good impact other life details.  Good  dietary habits, working on improving his sleep anyway we can and maintaining sustained physical activity will be important overall.  REVIEW OF SYSTEMS: Out of a complete 14 system review of symptoms, the patient complains only of the following symptoms, and all other reviewed systems are negative.  ALLERGIES: Allergies  Allergen Reactions   Fentanyl Shortness Of Breath   Meloxicam Other (See Comments)    Leg pain   Rosuvastatin Other (See Comments)    Elevated liver enzymes   Wound Dressing Adhesive     Tape causing abrasions on his skin    Ciprofloxacin Rash   Penicillins Rash    Has patient had a PCN reaction causing immediate rash, facial/tongue/throat swelling, SOB or lightheadedness with hypotension: No Has patient had a PCN reaction causing severe rash involving mucus membranes or skin necrosis: No Has patient had a PCN reaction that required hospitalization: No Has patient had a PCN reaction occurring within the last 10 years: No If all of the above answers are "NO", then may proceed with Cephalosporin use. Other reaction(s): rash    HOME MEDICATIONS: Outpatient Medications Prior to Visit  Medication Sig Dispense Refill   acetaminophen (TYLENOL) 325 MG tablet Take by mouth.     Cholecalciferol 1.25 MG (50000 UT) capsule Take 1 capsule by mouth once a week.     clobetasol cream (TEMOVATE) 0.05 % Apply topically.     ezetimibe (ZETIA) 10 MG tablet Take 10 mg by mouth daily.     furosemide (LASIX) 20 MG tablet Take 1 tablet (20 mg total) by mouth 2 (two) times daily. 60 tablet 3   insulin regular (NOVOLIN R) 100 units/mL injection Inject 6 units with breakfast, 4 units with lunch, and 4 units with dinner plus correction dose as instructed.     levothyroxine (SYNTHROID) 88 MCG tablet Take 88 mcg by mouth every morning.     loratadine (CLARITIN) 10 MG tablet Take 1 tablet by mouth daily.     melatonin 3 MG TABS tablet Take by mouth.     morphine 10 MG/5ML solution Take 10 mL (20  mg total) by mouth every 6-8 (six to eight) hours as needed for Pain for up to 14 days     mycophenolate (CELLCEPT) 250 MG capsule Take by mouth.     naloxone (NARCAN) nasal spray 4 mg/0.1 mL Place into the nose.     ondansetron (ZOFRAN ODT) 4 MG disintegrating tablet Take 1 tablet (4 mg total) by mouth every 8 (eight) hours as needed for nausea or vomiting. (Patient taking differently: Take 4 mg by mouth every 8 (eight) hours as needed for nausea or vomiting (dissolve orally).) 6 tablet 0   pantoprazole (PROTONIX) 20 MG tablet Take 1 tablet (20 mg total) by mouth daily. 90 tablet 1   polyethylene glycol powder (GLYCOLAX/MIRALAX) 17 GM/SCOOP powder Take by mouth.  predniSONE (DELTASONE) 5 MG tablet Take by mouth.     tacrolimus (PROGRAF) 1 MG capsule Take by mouth.     tamsulosin (FLOMAX) 0.4 MG CAPS capsule Take by mouth.     valGANciclovir (VALCYTE) 450 MG tablet Take 900 mg by mouth daily.     busPIRone (BUSPAR) 15 MG tablet Take 7.5-15 mg by mouth 2 (two) times daily. Take 1/2 tablet (7.5 mg) in the morning and Take 1 tablet (15 mg) at supper     escitalopram (LEXAPRO) 5 MG tablet Take 5 mg by mouth daily.     levETIRAcetam (KEPPRA XR) 500 MG 24 hr tablet Take 2 tablets (1,000 mg total) by mouth daily. (Patient taking differently: Take 1,000 mg by mouth at bedtime.) 180 tablet 3   lactulose (CHRONULAC) 10 GM/15ML solution Take 15 mLs (10 g total) by mouth 2 (two) times daily as needed for mild constipation (for bowel movments 2-3 per day). (Patient taking differently: Take 10 g by mouth 2 (two) times daily as needed for mild constipation (for bowel movments 2-3 per day). Pt gives usually between 7- 10 mls as needed)     cetirizine (ZYRTEC) 5 MG chewable tablet Chew 5 mg by mouth daily.     clonazePAM (KLONOPIN) 0.5 MG tablet Take 0.5 mg by mouth at bedtime.     colesevelam (WELCHOL) 625 MG tablet Take 625 mg by mouth daily with breakfast.     Emollient (AQUAPHOR ADV THERAPY HEALING) OINT  Place 1 Application into left nostril 2 (two) times daily.     folic acid (FOLVITE) 1 MG tablet Take 1 mg by mouth at bedtime.     gabapentin (NEURONTIN) 100 MG capsule Take by mouth.     hydrocortisone (CORTEF) 10 MG tablet Take 1 tablet (10 mg total) by mouth 2 (two) times daily. 60 tablet 1   midodrine (PROAMATINE) 5 MG tablet Take 3 tablets (15 mg total) by mouth 3 (three) times daily with meals. 270 tablet 3   Multiple Vitamin (MULTIVITAMIN PO) Take by mouth.     ofloxacin (OCUFLOX) 0.3 % ophthalmic solution SMARTSIG:In Eye(s)     oxyCODONE (OXY IR/ROXICODONE) 5 MG immediate release tablet Take 5 mg by mouth 4 (four) times daily as needed.     prednisoLONE acetate (PRED FORTE) 1 % ophthalmic suspension Place into the left eye.     rifaximin (XIFAXAN) 550 MG TABS tablet Take 1 tablet (550 mg total) by mouth 2 (two) times daily. 60 tablet 6   simethicone (MYLICON) 125 MG chewable tablet Chew by mouth.     sodium chloride (SALINE MIST) 0.65 % nasal spray Place 1 spray into the nose 3 (three) times daily.     spironolactone (ALDACTONE) 25 MG tablet Take 1 tablet by mouth daily.     thiamine 100 MG tablet Take 1 tablet (100 mg total) by mouth daily. 90 tablet 0   zinc sulfate 220 (50 Zn) MG capsule Take by mouth.     No facility-administered medications prior to visit.    PAST MEDICAL HISTORY: Past Medical History:  Diagnosis Date   Allergy    Anxiety    Arthritis    Ascites    Elevated transaminase level    Fatigue    Fatty liver    Hyperlipidemia    Hypertension    "under control", not on any blood pressure medication, has lost 60 lbs   Hypothyroidism    on synthroid   Inguinal muscle strain    Insomnia  Left knee pain    "long time ago"   Multinodular goiter    Obesity    Pneumonia    Psoriasis    Tinnitus     PAST SURGICAL HISTORY: Past Surgical History:  Procedure Laterality Date   APPENDECTOMY  1976   FOOT SURGERY Bilateral 1975   IR PARACENTESIS  11/25/2021    IR PARACENTESIS  12/02/2021   IR PARACENTESIS  12/17/2021   IR PARACENTESIS  01/05/2022   IR PARACENTESIS  02/04/2022   IR PARACENTESIS  02/15/2022   IR PARACENTESIS  03/03/2022   IR PARACENTESIS  03/10/2022   IR PARACENTESIS  03/16/2022   IR PARACENTESIS  03/23/2022   IR PARACENTESIS  03/29/2022   IR PARACENTESIS  04/06/2022   IR PARACENTESIS  04/20/2022   IR PARACENTESIS  05/04/2022   IR PARACENTESIS  05/20/2022   IR PARACENTESIS  05/31/2022   IR PARACENTESIS  06/11/2022   IR PARACENTESIS  06/18/2022   IR PARACENTESIS  06/25/2022   IR PARACENTESIS  07/02/2022   IR PARACENTESIS  07/09/2022   IR PARACENTESIS  07/20/2022   IR PARACENTESIS  07/27/2022   IR PARACENTESIS  08/03/2022   IR PARACENTESIS  08/09/2022   IR PARACENTESIS  08/13/2022   IR PARACENTESIS  08/17/2022   IR PARACENTESIS  08/20/2022   IR PARACENTESIS  08/23/2022   IR PARACENTESIS  08/26/2022   IR PARACENTESIS  08/30/2022   IR PARACENTESIS  09/02/2022   IR PARACENTESIS  09/07/2022   IR PARACENTESIS  09/10/2022   IR PARACENTESIS  09/15/2022   IR PARACENTESIS  09/20/2022   IR PARACENTESIS  09/23/2022   IR PARACENTESIS  09/28/2022   IR RADIOLOGIST EVAL & MGMT  12/17/2021   IR RADIOLOGIST EVAL & MGMT  12/31/2021   NOSE SURGERY     ROTATOR CUFF REPAIR Left    left   SHOULDER SURGERY Right 12/20/2019   [facility]     FAMILY HISTORY: Family History  Problem Relation Age of Onset   Breast cancer Mother    Hyperlipidemia Mother    Other Mother        ? form of parkinson's    Parkinsonism Mother    Bladder Cancer Father    Parkinson's disease Father    Memory loss Maternal Grandmother        in her 90s   Birth defects Maternal Grandfather    Birth defects Paternal Grandmother    Colon cancer Neg Hx    Esophageal cancer Neg Hx    Rectal cancer Neg Hx    Stomach cancer Neg Hx    Colon polyps Neg Hx    Dementia Neg Hx    Alzheimer's disease Neg Hx     SOCIAL HISTORY: Social History   Socioeconomic History   Marital  status: Married    Spouse name: Not on file   Number of children: 2   Years of education: Not on file   Highest education level: Associate degree: academic program  Occupational History   Occupation: Airline pilot  Tobacco Use   Smoking status: Former    Types: Cigarettes    Quit date: 03/27/1994    Years since quitting: 28.8   Smokeless tobacco: Former    Types: Chew   Tobacco comments:    did chew as a teenager  Vaping Use   Vaping Use: Never used  Substance and Sexual Activity   Alcohol use: Not Currently    Alcohol/week: 2.0 standard drinks of alcohol    Types:  2 Glasses of wine per week   Drug use: No   Sexual activity: Not on file  Other Topics Concern   Not on file  Social History Narrative   Lives at home with wife    Right handed   Caffeine: maybe 1-3 cups/week   Social Determinants of Health   Financial Resource Strain: Not on file  Food Insecurity: No Food Insecurity (06/01/2022)   Hunger Vital Sign    Worried About Running Out of Food in the Last Year: Never true    Ran Out of Food in the Last Year: Never true  Transportation Needs: No Transportation Needs (06/01/2022)   PRAPARE - Administrator, Civil Service (Medical): No    Lack of Transportation (Non-Medical): No  Physical Activity: Not on file  Stress: Not on file  Social Connections: Not on file  Intimate Partner Violence: Not At Risk (06/01/2022)   Humiliation, Afraid, Rape, and Kick questionnaire    Fear of Current or Ex-Partner: No    Emotionally Abused: No    Physically Abused: No    Sexually Abused: No      PHYSICAL EXAM  Exam: NAD, pleasant                  Speech:    Speech is normal; fluent and spontaneous with normal comprehension.  Cognition:    The patient is oriented to person, place, and time;     recent and remote memory intact;     language fluent;    Cranial Nerves:    The pupils are equal, round, and reactive to light.Trigeminal sensation is intact and the muscles  of mastication are normal. The face is symmetric. The palate elevates in the midline. Hearing intact. Voice is normal. Shoulder shrug is normal. The tongue has normal motion without fasciculations.   Coordination:  No dysmetria  Motor Observation:    No asymmetry, no atrophy, and no involuntary movements noted. Tone:    Normal muscle tone.     Strength:    Strength is V/V in the upper and lower limbs.      Sensation: intact to LT    Vitals:   01/25/23 0842  BP: (!) 91/51  Pulse: 77  Weight: 155 lb (70.3 kg)  Height: 5\' 6"  (1.676 m)     Body mass index is 25.02 kg/m.     09/16/2022    8:47 AM 06/17/2022    9:04 AM 09/04/2020    7:57 AM  MMSE - Mini Mental State Exam  Orientation to time 5 3 4   Orientation to Place 5 4 5   Registration 3 3 3   Attention/ Calculation 1 2 5   Recall 3 3 3   Language- name 2 objects 2 2 2   Language- repeat 1 1 1   Language- follow 3 step command 3 3 3   Language- read & follow direction 1 1 1   Write a sentence 0 1 1  Copy design 0 0 1  Total score 24 23 29    DIAGNOSTIC DATA (LABS, IMAGING, TESTING) - I reviewed patient records, labs, notes, testing and imaging myself where available.  Lab Results  Component Value Date   WBC 3.3 (L) 01/07/2023   HGB 9.4 (L) 01/07/2023   HCT 30.7 (L) 01/07/2023   MCV 92.7 01/07/2023   PLT 383 01/07/2023      Component Value Date/Time   NA 139 01/07/2023 0920   NA 138 11/13/2020 1153   K 3.8 01/07/2023 0920   CL  105 01/07/2023 0920   CO2 27 01/07/2023 0920   GLUCOSE 206 (H) 01/07/2023 0920   BUN 23 01/07/2023 0920   BUN 6 11/13/2020 1153   CREATININE 1.22 01/07/2023 0920   CALCIUM 8.7 (L) 01/07/2023 0920   PROT 5.9 (L) 01/07/2023 0920   PROT 7.2 11/13/2020 1153   ALBUMIN 3.2 (L) 01/07/2023 0920   ALBUMIN 4.4 11/13/2020 1153   AST 33 01/07/2023 0920   ALT 53 (H) 01/07/2023 0920   ALKPHOS 297 (H) 01/07/2023 0920   BILITOT 0.3 01/07/2023 0920   BILITOT 2.6 (H) 11/13/2020 1153   GFRNONAA >60  01/07/2023 0920   GFRAA 108 09/04/2020 0908    Lab Results  Component Value Date   HGBA1C 5.2 05/28/2022   Lab Results  Component Value Date   VITAMINB12 1,018 (H) 05/31/2022   Lab Results  Component Value Date   TSH 1.477 07/01/2022      ASSESSMENT AND PLAN 62 y.o. year old male  has a past medical history of Allergy, Anxiety, Arthritis, Ascites, Elevated transaminase level, Fatigue, Fatty liver, Hyperlipidemia, Hypertension, Hypothyroidism, Inguinal muscle strain, Insomnia, Left knee pain, Multinodular goiter, Obesity, Pneumonia, Psoriasis, and Tinnitus. here with :  Patient with seizures and recent liver transplant, looks so good he looks like a different person. Returns today for follow up. He is on tacrolimus and Keppra was increased to 1500XR. He is doing great after the liver transport, then had emergency surgery for hernia. Eveythng is fine, no seizures, doing well on 1500mg  of keppra. Would keep him on medication one year and then consider decreasing or stopping, seizures were likely due to elevated ammonia and liver disease.  See me in 6 months and if still doing well we can repeat EEG and discuss taking him off of seizure meds  Meds ordered this encounter  Medications   levETIRAcetam (KEPPRA XR) 500 MG 24 hr tablet    Sig: Take 3 tablets (1,500 mg total) by mouth at bedtime.    Dispense:  270 tablet    Refill:  4   busPIRone (BUSPAR) 15 MG tablet    Sig: Take 1/2 tablet (7.5 mg) in the morning and Take 1 tablet (15 mg) at supper    Dispense:  135 tablet    Refill:  4   escitalopram (LEXAPRO) 5 MG tablet    Sig: Take 1 tablet (5 mg total) by mouth daily.    Dispense:  90 tablet    Refill:  4     Naomie Dean, MD [facility] [address] [place], [place] [address] [phone]  I spent 20 minutes of face-to-face and non-face-to-face time with patient on the  1. Complex partial seizures with impaired consciousness at onset [facility])    2. Alcoholic cirrhosis of liver with ascites (HCC)    diagnosis.  This included previsit chart review, lab review, study review, order entry, electronic health record documentation, patient education on the different diagnostic and therapeutic options, counseling and coordination of care, risks and benefits of management, compliance, or risk factor reduction

## 2023-01-25 NOTE — Patient Instructions (Signed)
  Meds ordered this encounter  Medications   levETIRAcetam (KEPPRA XR) 500 MG 24 hr tablet    Sig: Take 3 tablets (1,500 mg total) by mouth at bedtime.    Dispense:  270 tablet    Refill:  4   busPIRone (BUSPAR) 15 MG tablet    Sig: Take 1/2 tablet (7.5 mg) in the morning and Take 1 tablet (15 mg) at supper    Dispense:  135 tablet    Refill:  4   escitalopram (LEXAPRO) 5 MG tablet    Sig: Take 1 tablet (5 mg total) by mouth daily.    Dispense:  90 tablet    Refill:  4

## 2023-01-26 ENCOUNTER — Ambulatory Visit: Payer: BC Managed Care – PPO | Admitting: Neurology

## 2023-01-30 ENCOUNTER — Emergency Department (HOSPITAL_COMMUNITY): Payer: Medicare PPO

## 2023-01-30 ENCOUNTER — Encounter (HOSPITAL_COMMUNITY): Payer: Self-pay

## 2023-01-30 ENCOUNTER — Other Ambulatory Visit: Payer: Self-pay

## 2023-01-30 ENCOUNTER — Emergency Department (HOSPITAL_COMMUNITY)
Admission: EM | Admit: 2023-01-30 | Discharge: 2023-01-30 | Disposition: A | Discharge status: Home / Self Care | Payer: Medicare PPO | Attending: Emergency Medicine | Admitting: Emergency Medicine

## 2023-01-30 DIAGNOSIS — E039 Hypothyroidism, unspecified: Secondary | ICD-10-CM | POA: Insufficient documentation

## 2023-01-30 DIAGNOSIS — J168 Pneumonia due to other specified infectious organisms: Secondary | ICD-10-CM | POA: Diagnosis not present

## 2023-01-30 DIAGNOSIS — E871 Hypo-osmolality and hyponatremia: Secondary | ICD-10-CM | POA: Insufficient documentation

## 2023-01-30 DIAGNOSIS — R509 Fever, unspecified: Secondary | ICD-10-CM | POA: Diagnosis present

## 2023-01-30 DIAGNOSIS — Z20822 Contact with and (suspected) exposure to covid-19: Secondary | ICD-10-CM | POA: Insufficient documentation

## 2023-01-30 DIAGNOSIS — Z79899 Other long term (current) drug therapy: Secondary | ICD-10-CM | POA: Diagnosis not present

## 2023-01-30 DIAGNOSIS — J189 Pneumonia, unspecified organism: Secondary | ICD-10-CM

## 2023-01-30 LAB — CBC WITH DIFFERENTIAL/PLATELET
Abs Immature Granulocytes: 0 K/uL (ref 0.00–0.07)
Basophils Absolute: 0 K/uL (ref 0.0–0.1)
Basophils Relative: 0 %
Eosinophils Absolute: 0 K/uL (ref 0.0–0.5)
Eosinophils Relative: 1 %
HCT: 27.1 % — ABNORMAL LOW (ref 39.0–52.0)
Hemoglobin: 8.6 g/dL — ABNORMAL LOW (ref 13.0–17.0)
Lymphocytes Relative: 23 %
Lymphs Abs: 0.9 K/uL (ref 0.7–4.0)
MCH: 28.2 pg (ref 26.0–34.0)
MCHC: 31.7 g/dL (ref 30.0–36.0)
MCV: 88.9 fL (ref 80.0–100.0)
Monocytes Absolute: 0.2 K/uL (ref 0.1–1.0)
Monocytes Relative: 4 %
Neutro Abs: 2.9 K/uL (ref 1.7–7.7)
Neutrophils Relative %: 72 %
Platelets: 342 K/uL (ref 150–400)
RBC: 3.05 MIL/uL — ABNORMAL LOW (ref 4.22–5.81)
RDW: 14.1 % (ref 11.5–15.5)
WBC: 4 K/uL (ref 4.0–10.5)
nRBC: 0 % (ref 0.0–0.2)
nRBC: 0 /100{WBCs}

## 2023-01-30 LAB — URINALYSIS, ROUTINE W REFLEX MICROSCOPIC
Bilirubin Urine: NEGATIVE
Glucose, UA: NEGATIVE mg/dL
Hgb urine dipstick: NEGATIVE
Ketones, ur: NEGATIVE mg/dL
Leukocytes,Ua: NEGATIVE
Nitrite: NEGATIVE
Protein, ur: NEGATIVE mg/dL
Specific Gravity, Urine: 1.033 — ABNORMAL HIGH (ref 1.005–1.030)
pH: 5 (ref 5.0–8.0)

## 2023-01-30 LAB — COMPREHENSIVE METABOLIC PANEL WITH GFR
ALT: 28 U/L (ref 0–44)
AST: 29 U/L (ref 15–41)
Albumin: 2.9 g/dL — ABNORMAL LOW (ref 3.5–5.0)
Alkaline Phosphatase: 345 U/L — ABNORMAL HIGH (ref 38–126)
Anion gap: 13 (ref 5–15)
BUN: 29 mg/dL — ABNORMAL HIGH (ref 8–23)
CO2: 19 mmol/L — ABNORMAL LOW (ref 22–32)
Calcium: 7.9 mg/dL — ABNORMAL LOW (ref 8.9–10.3)
Chloride: 100 mmol/L (ref 98–111)
Creatinine, Ser: 1.4 mg/dL — ABNORMAL HIGH (ref 0.61–1.24)
GFR, Estimated: 57 mL/min — ABNORMAL LOW (ref >60)
Glucose, Bld: 126 mg/dL — ABNORMAL HIGH (ref 70–99)
Potassium: 4 mmol/L (ref 3.5–5.1)
Sodium: 132 mmol/L — ABNORMAL LOW (ref 135–145)
Total Bilirubin: 0.3 mg/dL (ref 0.3–1.2)
Total Protein: 5.8 g/dL — ABNORMAL LOW (ref 6.5–8.1)

## 2023-01-30 LAB — RESP PANEL BY RT-PCR (RSV, FLU A&B, COVID)  RVPGX2
Influenza A by PCR: NEGATIVE
Influenza B by PCR: NEGATIVE
Resp Syncytial Virus by PCR: NEGATIVE
SARS Coronavirus 2 by RT PCR: NEGATIVE

## 2023-01-30 LAB — LIPASE, BLOOD: Lipase: 28 U/L (ref 11–51)

## 2023-01-30 MED ORDER — DOXYCYCLINE HYCLATE 100 MG PO CAPS: 100.0000 mg | ORAL_CAPSULE | Freq: Two times a day (BID) | ORAL | 0 refills | Status: DC

## 2023-01-30 MED ORDER — CEFDINIR 300 MG PO CAPS: 300.0000 mg | ORAL_CAPSULE | Freq: Two times a day (BID) | ORAL | 0 refills | Status: DC

## 2023-01-30 MED ORDER — IOHEXOL 350 MG/ML SOLN
75.0000 mL | Freq: Once | INTRAVENOUS | Status: AC | PRN
  Administered 2023-01-30: 75 mL via INTRAVENOUS

## 2023-01-30 NOTE — ED Triage Notes (Signed)
Pt had a fever of 104 last night. Pt states urine is more concentrated and didn't urinate much yesterday. Pt c/o aching [id number]

## 2023-01-30 NOTE — ED Provider Notes (Signed)
  Physical Exam  BP (!) 120/57   Pulse (!) 59   Temp 99.6 F (37.6 C) (Oral)   Resp 18   Ht 5\' 6"  (1.676 m)   Wt 70.3 kg   SpO2 99%   BMI 25.01 kg/m   Physical Exam Vitals and nursing note reviewed.  Constitutional:      General: He is not in acute distress.    Appearance: He is not toxic-appearing.  HENT:     Head: Normocephalic and atraumatic.  Pulmonary:     Effort: No respiratory distress.  Skin:    Coloration: Skin is not pale.  Neurological:     Mental Status: He is alert and oriented to person, place, and time.  Psychiatric:        Behavior: Behavior normal.     Procedures  Procedures  ED Course / MDM   Clinical Course as of 01/30/23 1541  Sun Jan 30, 2023  1441 Discussed with patient and wife lab and imaging results.  Discussed with patient and wife plans for dose of IV antibiotics in the emergency department prior to discharge home with antibiotics.  Wife notes and request that we speak with [facility] liver transplant team. [SB]  1444 Discussion with ED Pharmacist who will review patient medications for antibiotic treatment.  [SB]  1448 Ambulated in the ED with O2 remaining at 96%, heart rate at 80. Patient asymptomatic.  [SB]  1459 Attempted to reach out to [facility] Liver Transplant clinic to discuss antibiotic therapy treatment for possible pneumonia.  [SB]  1506 CAP. Just had liver transplant in march, umbilical hernia repair in April. On antirejection meds. Fever to 104 yesterday, tylenol, last dose at 8am this morning. Not hypoxic.  [CG]  1515 Doxycycline 100mg  BID PO, Cefdinir 300mg  PO BID x5 days each.  [CG]    Clinical Course User Index [CG] Al Decant, PA-C [SB] Blue, Soijett A, PA-C   Medical Decision Making Amount and/or Complexity of Data Reviewed Labs: ordered. Radiology: ordered.  Risk Prescription drug management.   62 year old male signed out to me at shift change pending antibiotic therapy discussion.  Please see previous provider's  note for full details.  In short this is a 62 year old male who comes in with recent liver transplantation.  Patient found to have community-acquired pneumonia here, he is not hypoxic per previous provider who ambulated patient.  The patient was signed out to me pending discussion with pharmacy regarding his antibiotic choices.  The patient had a recent liver transplant and he and his wife are concerned about antibiotic therapy that will interfere with immune modulating drugs he is currently on.  Discussed with ED pharmacist who recommends doxycycline twice daily as well as cefdinir twice daily.  Discussed this with patient and patient wife at bedside who are agreeable to this plan.  Discussed this with Neysa Bonito of [facility] liver transplant team who is also agreeable.  The patient will follow-up with his liver transplant team this week.  He will return to the ED with any new or worsening symptoms.       Al Decant, PA-C 01/30/23 1541    Gwyneth Sprout, MD 02/02/23 [phone]

## 2023-01-30 NOTE — ED Notes (Signed)
Pulse-80 and O2 96 while walking, PA notified.

## 2023-01-30 NOTE — ED Provider Notes (Signed)
[facility] EMERGENCY DEPARTMENT AT [facility] Provider Note   CSN: [id number] Arrival date & time: 01/30/23  5638     History  Chief Complaint  Patient presents with  . Fever    Andrew Patton is a 62 y.o. male with a past medical history of hyperlipidemia, hypothyroidism, who presents emergency department with concerns for fever onset 2 nights.  Notes for the past 2 days he is waking up with sweaty close.  Notes last night he had some shaking to his arms and hands.  His temperature was taken and it was at 104.  He has prior to arrival to the ED today his temperature was 101.8 his last dose of Tylenol was at 8 AM it was 650 mg.  He typically takes Tylenol 3 times a day for his pain.  He is currently at [facility] patient status post liver transplant in March 2024.  Also status post umbilical hernia surgery in 11/25/2022.  Has associated nasal congestion, generalized bodyaches, constipation (chronic due to narcotics), dysuria.  Currently on liquid morphine status post his hernia surgery.  Denies rhinorrhea, sore throat, nausea, vomiting, diarrhea, hematuria, chest pain, shortness of breath.   The history is provided by the patient. No language interpreter was used.       Home Medications Prior to Admission medications   Medication Sig Start Date End Date Taking? Authorizing Provider  acetaminophen (TYLENOL) 325 MG tablet Take by mouth. 12/16/22   [provider]  busPIRone (BUSPAR) 15 MG tablet Take 1/2 tablet (7.5 mg) in the morning and Take 1 tablet (15 mg) at supper 01/25/23   Anson Fret, MD  Cholecalciferol 1.25 MG (50000 UT) capsule Take 1 capsule by mouth once a week. 12/24/22 03/24/23  [provider]  clobetasol cream (TEMOVATE) 0.05 % Apply topically. 10/21/20   [provider]  escitalopram (LEXAPRO) 5 MG tablet Take 1 tablet (5 mg total) by mouth daily. 01/25/23   Anson Fret, MD  ezetimibe (ZETIA) 10 MG tablet Take 10 mg by mouth daily.     [provider]  furosemide (LASIX) 20 MG tablet Take 1 tablet (20 mg total) by mouth 2 (two) times daily. 07/08/22   Marinda Elk, MD  insulin regular (NOVOLIN R) 100 units/mL injection Inject 6 units with breakfast, 4 units with lunch, and 4 units with dinner plus correction dose as instructed. 11/12/22 05/14/23  [provider]  lactulose (CHRONULAC) 10 GM/15ML solution Take 15 mLs (10 g total) by mouth 2 (two) times daily as needed for mild constipation (for bowel movments 2-3 per day). Patient taking differently: Take 10 g by mouth 2 (two) times daily as needed for mild constipation (for bowel movments 2-3 per day). Pt gives usually between 7- 10 mls as needed 07/06/21   Pokhrel, Rebekah Chesterfield, MD  levETIRAcetam (KEPPRA XR) 500 MG 24 hr tablet Take 3 tablets (1,500 mg total) by mouth at bedtime. 01/25/23   Anson Fret, MD  levothyroxine (SYNTHROID) 88 MCG tablet Take 88 mcg by mouth every morning. 02/14/22   [provider]  loratadine (CLARITIN) 10 MG tablet Take 1 tablet by mouth daily. 11/09/22   [provider]  melatonin 3 MG TABS tablet Take by mouth. 11/09/22 02/07/23  [provider]  morphine 10 MG/5ML solution Take 10 mL (20 mg total) by mouth every 6-8 (six to eight) hours as needed for Pain for up to 14 days 01/20/23   [provider]  mycophenolate (CELLCEPT) 250  MG capsule Take by mouth. 12/24/22   [provider]  naloxone [facility]) nasal spray 4 mg/0.1 mL Place into the nose. 07/14/22   [provider]  ondansetron (ZOFRAN ODT) 4 MG disintegrating tablet Take 1 tablet (4 mg total) by mouth every 8 (eight) hours as needed for nausea or vomiting. Patient taking differently: Take 4 mg by mouth every 8 (eight) hours as needed for nausea or vomiting (dissolve orally). 02/08/20   Maxwell Caul, PA-C  pantoprazole (PROTONIX) 20 MG tablet Take 1 tablet (20 mg total) by mouth daily. 08/16/22   Cirigliano, Vito V, DO   polyethylene glycol powder (GLYCOLAX/MIRALAX) 17 GM/SCOOP powder Take by mouth.    [provider]  predniSONE (DELTASONE) 5 MG tablet Take by mouth. 01/11/23 01/11/24  [provider]  tacrolimus (PROGRAF) 1 MG capsule Take by mouth. 01/17/23 01/17/24  [provider]  tamsulosin (FLOMAX) 0.4 MG CAPS capsule Take by mouth. 12/31/22 12/31/23  [provider]  valGANciclovir (VALCYTE) 450 MG tablet Take 900 mg by mouth daily.    [provider]      Allergies    Fentanyl, Meloxicam, Rosuvastatin, Wound dressing adhesive, Ciprofloxacin, and Penicillins    Review of Systems   Review of Systems  All other systems reviewed and are negative.   Physical Exam Updated Vital Signs BP (!) 127/59 (BP Location: Right Arm)   Pulse 73   Temp 98.7 F (37.1 C) (Oral)   Resp 17   Ht 5\' 6"  (1.676 m)   Wt 70.3 kg   SpO2 99%   BMI 25.01 kg/m  Physical Exam Vitals and nursing note reviewed.  Constitutional:      General: He is not in acute distress.    Appearance: Normal appearance.  Eyes:     General: No scleral icterus.    Extraocular Movements: Extraocular movements intact.  Cardiovascular:     Rate and Rhythm: Normal rate.  Pulmonary:     Effort: Pulmonary effort is normal. No respiratory distress.  Abdominal:     Palpations: Abdomen is soft. There is no mass.     Tenderness: There is no abdominal tenderness.     Comments: Tenderness to palpation noted to right lower quadrant.  Well-healing surgical scars noted to abdomen.  No appreciable erythema or drainage noted to the areas.  Musculoskeletal:        General: Normal range of motion.     Cervical back: Neck supple.  Skin:    General: Skin is warm and dry.     Findings: No rash.  Neurological:     Mental Status: He is alert.     Sensory: Sensation is intact.     Motor: Motor function is intact.  Psychiatric:        Behavior: Behavior normal.     ED Results / Procedures / Treatments    Labs (all labs ordered are listed, but only abnormal results are displayed) Labs Reviewed  CBC WITH DIFFERENTIAL/PLATELET - Abnormal; Notable for the following components:      Result Value   RBC 3.05 (*)    Hemoglobin 8.6 (*)    HCT 27.1 (*)    All other components within normal limits  COMPREHENSIVE METABOLIC PANEL - Abnormal; Notable for the following components:   Sodium 132 (*)    CO2 19 (*)    Glucose, Bld 126 (*)    BUN 29 (*)    Creatinine, Ser 1.40 (*)    Calcium 7.9 (*)  Total Protein 5.8 (*)    Albumin 2.9 (*)    Alkaline Phosphatase 345 (*)    GFR, Estimated 57 (*)    All other components within normal limits  URINALYSIS, ROUTINE W REFLEX MICROSCOPIC - Abnormal; Notable for the following components:   Specific Gravity, Urine 1.033 (*)    All other components within normal limits  RESP PANEL BY RT-PCR (RSV, FLU A&B, COVID)  RVPGX2  CULTURE, BLOOD (ROUTINE X 2)  CULTURE, BLOOD (ROUTINE X 2)  LIPASE, BLOOD    EKG None  Radiology DG Chest 2 View  Result Date: 01/30/2023 CLINICAL DATA:  Fever EXAM: CHEST - 2 VIEW COMPARISON:  Chest x-ray June 18, 2022 FINDINGS: The cardiomediastinal silhouette is unchanged in contour. Low lung volumes with bronchovascular crowding. Possible left basilar retrocardiac pulmonary opacity. No pleural effusion or pneumothorax. Surgical clips and a stent are visualized in the upper abdomen. No acute osseous abnormality. IMPRESSION: Possible left basilar retrocardiac pulmonary opacity. Differential considerations include atelectasis, aspiration, or infection. Electronically Signed   By: Jacob Moores M.D.   On: 01/30/2023 13:40   CT ABDOMEN PELVIS W CONTRAST  Result Date: 01/30/2023 CLINICAL DATA:  Right lower quadrant pain. Liver transplant patient. EXAM: CT ABDOMEN AND PELVIS WITH CONTRAST TECHNIQUE: Multidetector CT imaging of the abdomen and pelvis was performed using the standard protocol following bolus administration of  intravenous contrast. RADIATION DOSE REDUCTION: This exam was performed according to the departmental dose-optimization program which includes automated exposure control, adjustment of the mA and/or kV according to patient size and/or use of iterative reconstruction technique. CONTRAST:  75mL OMNIPAQUE IOHEXOL 350 MG/ML SOLN COMPARISON:  11/25/2022 FINDINGS: Lower Chest: No acute findings. Hepatobiliary: Transplant liver is unremarkable in appearance. No hepatic lesions are identified. Common bile duct stent is seen in expected position, and there is no evidence of biliary ductal dilatation. Mild pneumobilia noted. Pancreas:  No mass or inflammatory changes. Spleen: Within normal limits in size and appearance. Adrenals/Urinary Tract: No suspicious masses identified. No evidence of ureteral calculi or hydronephrosis. Stomach/Bowel: Umbilical hernia has been repaired since previous study and there is no evidence of recurrent hernia. No evidence of obstruction, inflammatory process or abscess. Moderate ascites and diffuse mesenteric edema again seen, without significant change. Diverticulosis is seen mainly involving the sigmoid colon, however there is no evidence of diverticulitis. Although the appendix is not directly visualized, no inflammatory process seen in region of the cecum or elsewhere. Vascular/Lymphatic: No pathologically enlarged lymph nodes. No acute vascular findings. Aortic atherosclerotic calcification incidentally noted. Reproductive:  No mass or other significant abnormality. Other:  None. Musculoskeletal:  No suspicious bone lesions identified. IMPRESSION: Interval repair of umbilical hernia. No evidence of recurrent hernia or other acute findings. Unremarkable appearance of transplanted liver. Stable moderate ascites and diffuse mesenteric edema. Sigmoid diverticulosis, without radiographic evidence of diverticulitis. Aortic Atherosclerosis (ICD10-I70.0). Electronically Signed   By: Danae Orleans  M.D.   On: 01/30/2023 12:01    Procedures Procedures    Medications Ordered in ED Medications  iohexol (OMNIPAQUE) 350 MG/ML injection 75 mL (75 mLs Intravenous Contrast Given 01/30/23 1141)    ED Course/ Medical Decision Making/ A&P Clinical Course as of 01/30/23 1510  Sun Jan 30, 2023  1441 Discussed with patient and wife lab and imaging results.  Discussed with patient and wife plans for dose of IV antibiotics in the emergency department prior to discharge home with antibiotics.  Wife notes and request that we speak with [facility] liver transplant team. [SB]  1444  Discussion with ED Pharmacist who will review patient medications for antibiotic treatment.  [SB]  1448 Ambulated in the ED with O2 remaining at 96%, heart rate at 80. Patient asymptomatic.  [SB]  1459 Attempted to reach out to [facility] Liver Transplant clinic to discuss antibiotic therapy treatment for possible pneumonia.  [SB]  1506 CAP. Just had liver transplant in march, umbilical hernia repair in April. On antirejection meds. Fever to 104 yesterday, tylenol, last dose at 8am this morning. Not hypoxic.  [CG]    Clinical Course User Index [CG] Al Decant, PA-C [SB] Jordann Grime A, PA-C                             Medical Decision Making Amount and/or Complexity of Data Reviewed Labs: ordered. Radiology: ordered.  Risk Prescription drug management.   Patient presents to the emergency department with concerns for fever x 2 days.  On exam patient with Tenderness to palpation noted to right lower quadrant.  Well-healing surgical scars noted to abdomen.  No appreciable erythema or drainage noted to the areas. Remainder of exam without acute findings.  Pt afebrile. Differential diagnosis includes pancreatitis, cholecystitis, appendicitis, PNA, COVID, Flu, RSV, viral etiology.    Labs:  I ordered, and personally interpreted labs.  The pertinent results include:  Lipase negative CBC without leukocytosis, hgb at 8.6   CMP with hyponatremia at 132, glucose at 126, creatinine slightly elevated at 1.4, BUN slightly elevated at 29, Alk phos at 345, GFR at 57 Blood cultures ordered with results pending COVID swab negative Flu swab negative RSV swab negative Urinalysis negative  Imaging: I ordered imaging studies including Chest x-ray CT abdomen pelvis with I independently visualized and interpreted imaging which showed CT AP with  Interval repair of umbilical hernia. No evidence of recurrent hernia  or other acute findings.    Unremarkable appearance of transplanted liver. Stable moderate  ascites and diffuse mesenteric edema.    Sigmoid diverticulosis, without radiographic evidence of  diverticulitis.    Aortic Atherosclerosis (ICD10-I70.0).   CXR with  Possible left basilar retrocardiac pulmonary opacity. Differential  considerations include atelectasis, aspiration, or infection.   I agree with the radiologist interpretation  Patient case discussed with Jannifer Hick, PA-C at sign-out. Plan at sign-out is pending antibiotic treatment. Disposition as per oncoming team. However, plans may change as per oncoming team. Patient care transferred at sign out.    This chart was dictated using voice recognition software, Dragon. Despite the best efforts of this provider to proofread and correct errors, errors may still occur which can change documentation meaning.  Final Clinical Impression(s) / ED Diagnoses Final diagnoses:  Pneumonia due to infectious organism, unspecified laterality, unspecified part of lung    Rx / DC Orders ED Discharge Orders     None         Jaslen Adcox A, PA-C 01/30/23 1510    Horton, Clabe Seal, DO 01/30/23 1532

## 2023-01-30 NOTE — Discharge Instructions (Addendum)
Please return to the ED with any new or worsening signs or symptoms Please follow-up with liver transplant team regarding antibiotic therapy choice made here today. Please read attached guide concerning community-acquired pneumonia Please begin taking doxycycline and cefdinir.  You will take these both twice daily for the next 10 days.  These were sent to your pharmacy.

## 2023-01-31 LAB — CULTURE, BLOOD (ROUTINE X 2)

## 2023-02-01 LAB — CULTURE, BLOOD (ROUTINE X 2)
Special Requests: ADEQUATE
Special Requests: ADEQUATE

## 2023-02-03 LAB — CULTURE, BLOOD (ROUTINE X 2): Culture: NO GROWTH

## 2023-02-04 LAB — CULTURE, BLOOD (ROUTINE X 2): Culture: NO GROWTH

## 2023-02-23 ENCOUNTER — Emergency Department (HOSPITAL_COMMUNITY): Payer: Medicare PPO

## 2023-02-23 ENCOUNTER — Encounter (HOSPITAL_COMMUNITY): Payer: Self-pay

## 2023-02-23 ENCOUNTER — Emergency Department (HOSPITAL_COMMUNITY)
Admission: EM | Admit: 2023-02-23 | Discharge: 2023-02-23 | Disposition: A | Discharge status: Other Acute Inpatient Hospital | Payer: Medicare PPO | Attending: Emergency Medicine | Admitting: Emergency Medicine

## 2023-02-23 DIAGNOSIS — Z944 Liver transplant status: Secondary | ICD-10-CM | POA: Insufficient documentation

## 2023-02-23 DIAGNOSIS — I10 Essential (primary) hypertension: Secondary | ICD-10-CM | POA: Insufficient documentation

## 2023-02-23 DIAGNOSIS — R509 Fever, unspecified: Secondary | ICD-10-CM | POA: Diagnosis present

## 2023-02-23 DIAGNOSIS — T8144XA Sepsis following a procedure, initial encounter: Secondary | ICD-10-CM | POA: Diagnosis not present

## 2023-02-23 DIAGNOSIS — K750 Abscess of liver: Secondary | ICD-10-CM | POA: Insufficient documentation

## 2023-02-23 DIAGNOSIS — Z20822 Contact with and (suspected) exposure to covid-19: Secondary | ICD-10-CM | POA: Insufficient documentation

## 2023-02-23 DIAGNOSIS — D72819 Decreased white blood cell count, unspecified: Secondary | ICD-10-CM | POA: Diagnosis not present

## 2023-02-23 DIAGNOSIS — Z794 Long term (current) use of insulin: Secondary | ICD-10-CM | POA: Diagnosis not present

## 2023-02-23 DIAGNOSIS — E039 Hypothyroidism, unspecified: Secondary | ICD-10-CM | POA: Insufficient documentation

## 2023-02-23 DIAGNOSIS — A419 Sepsis, unspecified organism: Secondary | ICD-10-CM

## 2023-02-23 LAB — URINALYSIS, W/ REFLEX TO CULTURE (INFECTION SUSPECTED)
Bacteria, UA: NONE SEEN
Bilirubin Urine: NEGATIVE
Glucose, UA: NEGATIVE mg/dL
Hgb urine dipstick: NEGATIVE
Ketones, ur: NEGATIVE mg/dL
Leukocytes,Ua: NEGATIVE
Nitrite: NEGATIVE
Protein, ur: NEGATIVE mg/dL
Specific Gravity, Urine: 1.029 (ref 1.005–1.030)
pH: 5 (ref 5.0–8.0)

## 2023-02-23 LAB — COMPREHENSIVE METABOLIC PANEL
ALT: 24 U/L (ref 0–44)
AST: 29 U/L (ref 15–41)
Albumin: 2.7 g/dL — ABNORMAL LOW (ref 3.5–5.0)
Alkaline Phosphatase: 248 U/L — ABNORMAL HIGH (ref 38–126)
Anion gap: 10 (ref 5–15)
BUN: 20 mg/dL (ref 8–23)
CO2: 24 mmol/L (ref 22–32)
Calcium: 8 mg/dL — ABNORMAL LOW (ref 8.9–10.3)
Chloride: 103 mmol/L (ref 98–111)
Creatinine, Ser: 1.11 mg/dL (ref 0.61–1.24)
GFR, Estimated: >60 mL/min (ref >60)
Glucose, Bld: 115 mg/dL — ABNORMAL HIGH (ref 70–99)
Potassium: 3.5 mmol/L (ref 3.5–5.1)
Sodium: 137 mmol/L (ref 135–145)
Total Bilirubin: 0.4 mg/dL (ref 0.3–1.2)
Total Protein: 5.5 g/dL — ABNORMAL LOW (ref 6.5–8.1)

## 2023-02-23 LAB — CBC WITH DIFFERENTIAL/PLATELET
Abs Immature Granulocytes: 0 10*3/uL (ref 0.00–0.07)
Basophils Absolute: 0 10*3/uL (ref 0.0–0.1)
Basophils Relative: 0 %
Eosinophils Absolute: 0.1 10*3/uL (ref 0.0–0.5)
Eosinophils Relative: 5 %
HCT: 26.6 % — ABNORMAL LOW (ref 39.0–52.0)
Hemoglobin: 8.1 g/dL — ABNORMAL LOW (ref 13.0–17.0)
Lymphocytes Relative: 28 %
Lymphs Abs: 0.5 10*3/uL — ABNORMAL LOW (ref 0.7–4.0)
MCH: 27.2 pg (ref 26.0–34.0)
MCHC: 30.5 g/dL (ref 30.0–36.0)
MCV: 89.3 fL (ref 80.0–100.0)
Monocytes Absolute: 0.1 10*3/uL (ref 0.1–1.0)
Monocytes Relative: 3 %
Neutro Abs: 1.2 10*3/uL — ABNORMAL LOW (ref 1.7–7.7)
Neutrophils Relative %: 64 %
Platelets: 221 10*3/uL (ref 150–400)
RBC: 2.98 MIL/uL — ABNORMAL LOW (ref 4.22–5.81)
RDW: 15 % (ref 11.5–15.5)
WBC: 1.8 10*3/uL — ABNORMAL LOW (ref 4.0–10.5)
nRBC: 0 % (ref 0.0–0.2)
nRBC: 0 /100 WBC

## 2023-02-23 LAB — I-STAT CHEM 8, ED
BUN: 22 mg/dL (ref 8–23)
Calcium, Ion: 1.06 mmol/L — ABNORMAL LOW (ref 1.15–1.40)
Chloride: 104 mmol/L (ref 98–111)
Creatinine, Ser: 1.1 mg/dL (ref 0.61–1.24)
Glucose, Bld: 115 mg/dL — ABNORMAL HIGH (ref 70–99)
HCT: 26 % — ABNORMAL LOW (ref 39.0–52.0)
Hemoglobin: 8.8 g/dL — ABNORMAL LOW (ref 13.0–17.0)
Potassium: 3.6 mmol/L (ref 3.5–5.1)
Sodium: 140 mmol/L (ref 135–145)
TCO2: 23 mmol/L (ref 22–32)

## 2023-02-23 LAB — RESP PANEL BY RT-PCR (RSV, FLU A&B, COVID)  RVPGX2
Influenza A by PCR: NEGATIVE
Influenza B by PCR: NEGATIVE
Resp Syncytial Virus by PCR: NEGATIVE
SARS Coronavirus 2 by RT PCR: NEGATIVE

## 2023-02-23 LAB — PROTIME-INR
INR: 1.1 (ref 0.8–1.2)
Prothrombin Time: 14.2 seconds (ref 11.4–15.2)

## 2023-02-23 LAB — I-STAT CG4 LACTIC ACID, ED
Lactic Acid, Venous: 1.2 mmol/L (ref 0.5–1.9)
Lactic Acid, Venous: 0.9 mmol/L (ref 0.5–1.9)

## 2023-02-23 LAB — APTT: aPTT: 20 seconds — ABNORMAL LOW (ref 24–36)

## 2023-02-23 LAB — LIPASE, BLOOD: Lipase: 23 U/L (ref 11–51)

## 2023-02-23 MED ORDER — LACTATED RINGERS IV SOLN: INTRAVENOUS | Status: DC

## 2023-02-23 MED ORDER — IOHEXOL 350 MG/ML SOLN
75.0000 mL | Freq: Once | INTRAVENOUS | Status: AC | PRN
  Administered 2023-02-23: 75 mL via INTRAVENOUS

## 2023-02-23 MED ORDER — METRONIDAZOLE 500 MG/100ML IV SOLN
500.0000 mg | Freq: Once | INTRAVENOUS | Status: AC
  Administered 2023-02-23: 500 mg via INTRAVENOUS
  Filled 2023-02-23: qty 100

## 2023-02-23 MED ORDER — MORPHINE SULFATE (PF) 4 MG/ML IV SOLN
4.0000 mg | Freq: Once | INTRAVENOUS | Status: AC
  Administered 2023-02-23: 4 mg via INTRAVENOUS
  Filled 2023-02-23: qty 1

## 2023-02-23 MED ORDER — SODIUM CHLORIDE 0.9 % IV SOLN
2.0000 g | Freq: Once | INTRAVENOUS | Status: AC
  Administered 2023-02-23: 2 g via INTRAVENOUS
  Filled 2023-02-23: qty 12.5

## 2023-02-23 MED ORDER — HYDROCORTISONE SOD SUC (PF) 100 MG IJ SOLR
200.0000 mg | Freq: Once | INTRAMUSCULAR | Status: AC
  Administered 2023-02-23: 200 mg via INTRAVENOUS
  Filled 2023-02-23: qty 4

## 2023-02-23 MED ORDER — LACTATED RINGERS IV BOLUS (SEPSIS)
1500.0000 mL | Freq: Once | INTRAVENOUS | Status: AC
  Administered 2023-02-23: 1500 mL via INTRAVENOUS

## 2023-02-23 MED ORDER — VANCOMYCIN HCL IN DEXTROSE 1-5 GM/200ML-% IV SOLN: 1000.0000 mg | Freq: Once | INTRAVENOUS | Status: DC

## 2023-02-23 MED ORDER — LACTATED RINGERS IV BOLUS
1000.0000 mL | Freq: Once | INTRAVENOUS | Status: AC
  Administered 2023-02-23: 1000 mL via INTRAVENOUS

## 2023-02-23 MED ORDER — VANCOMYCIN HCL 1500 MG/300ML IV SOLN
1500.0000 mg | Freq: Once | INTRAVENOUS | Status: AC
  Administered 2023-02-23: 1500 mg via INTRAVENOUS
  Filled 2023-02-23: qty 300

## 2023-02-23 NOTE — ED Provider Notes (Signed)
Patient transferred to [facility].  Hemodynamically stable throughout my care.  Patient accepted by Dr. Titus Dubin.  This chart was dictated using voice recognition software.  Despite best efforts to proofread,  errors can occur which can change the documentation meaning.    Virgina Norfolk, DO 02/23/23 1943

## 2023-02-23 NOTE — Progress Notes (Signed)
ED Pharmacy Antibiotic Sign Off An antibiotic consult was received from an ED provider for vancomycin and cefepime per pharmacy dosing for sepsis. A chart review was completed to assess appropriateness.   The following one time order(s) were placed:  Vancomycin 1500mg  IV x 1 Cefepime 2g IV x 1  Further antibiotic and/or antibiotic pharmacy consults should be ordered by the admitting provider if indicated.   Thank you for allowing pharmacy to be a part of this patient's care.   London Nonaka, Drake Leach, [facility]  Clinical Pharmacist 02/23/23 7:25 AM

## 2023-02-23 NOTE — ED Provider Notes (Signed)
[place] EMERGENCY DEPARTMENT AT [facility] Provider Note   CSN: [id number] Arrival date & time: 02/23/23  0645     History  Chief Complaint  Patient presents with   Fever   Abdominal Pain    Andrew Patton is a 62 y.o. male.  HPI    62 year old male with a history of hypertension, liver transplantation 10/2022 on cellcept, umbilical hernia repair 11/2022, ERCP with biliary stent 01/2023 with ERCP removal and exchange of stent 7/16, new diagnosis of adrenal insufficiency who presents with concern for fever, chills, nausea and abdominal pain.  Has been increasingly fatigued, went to bed early last night.  Woke up around 2:50 AM with chills and was found to have a temperature of 100.7, woke up again with nausea, vomiting and chills and was found to have a temperature of 100.5.  He called his transplant surgery team who recommended he come to the emergency department.  Wife reports he has been having some intermittent low-grade fevers prior to having his last ERCP and stent exchange on July 16.  He had been doing pretty well since then.  Went to his regular pain management appointment at [facility] yesterday.  Last night, he had more fatigue than usual, wanted to go to bed at 930, and then woke up in the middle of the night with chills and fever.  She reports he has been having some abdominal pain off and on over the last several months, but he had been reporting abdominal pain more over the last 2 days.  Reports nausea this morning, no known vomiting. Is passing flatus. Does have constipation sort of chronically.  Denies cough, chest pain, shortness of breath, dysuria, rash, sore throat with the exception of dry mouth and throat, congestion.  He is taken his regular dose of steroid, but did not do stress test earlier today.  She reports he does have some chronic low blood pressures which is why they had done the evaluation for adrenal insufficiency.  Past Medical History:  Diagnosis  Date   Allergy    Anxiety    Arthritis    Ascites    Elevated transaminase level    Fatigue    Fatty liver    Hyperlipidemia    Hypertension    "under control", not on any blood pressure medication, has lost 60 lbs   Hypothyroidism    on synthroid   Inguinal muscle strain    Insomnia    Left knee pain    "long time ago"   Multinodular goiter    Obesity    Pneumonia    Psoriasis    Tinnitus     Past Surgical History:  Procedure Laterality Date   APPENDECTOMY  1976   FOOT SURGERY Bilateral 1975   IR PARACENTESIS  11/25/2021   IR PARACENTESIS  12/02/2021   IR PARACENTESIS  12/17/2021   IR PARACENTESIS  01/05/2022   IR PARACENTESIS  02/04/2022   IR PARACENTESIS  02/15/2022   IR PARACENTESIS  03/03/2022   IR PARACENTESIS  03/10/2022   IR PARACENTESIS  03/16/2022   IR PARACENTESIS  03/23/2022   IR PARACENTESIS  03/29/2022   IR PARACENTESIS  04/06/2022   IR PARACENTESIS  04/20/2022   IR PARACENTESIS  05/04/2022   IR PARACENTESIS  05/20/2022   IR PARACENTESIS  05/31/2022   IR PARACENTESIS  06/11/2022   IR PARACENTESIS  06/18/2022   IR PARACENTESIS  06/25/2022   IR PARACENTESIS  07/02/2022   IR PARACENTESIS  07/09/2022  IR PARACENTESIS  07/20/2022   IR PARACENTESIS  07/27/2022   IR PARACENTESIS  08/03/2022   IR PARACENTESIS  08/09/2022   IR PARACENTESIS  08/13/2022   IR PARACENTESIS  08/17/2022   IR PARACENTESIS  08/20/2022   IR PARACENTESIS  08/23/2022   IR PARACENTESIS  08/26/2022   IR PARACENTESIS  08/30/2022   IR PARACENTESIS  09/02/2022   IR PARACENTESIS  09/07/2022   IR PARACENTESIS  09/10/2022   IR PARACENTESIS  09/15/2022   IR PARACENTESIS  09/20/2022   IR PARACENTESIS  09/23/2022   IR PARACENTESIS  09/28/2022   IR RADIOLOGIST EVAL & MGMT  12/17/2021   IR RADIOLOGIST EVAL & MGMT  12/31/2021   NOSE SURGERY     ROTATOR CUFF REPAIR Left    left   SHOULDER SURGERY Right 12/20/2019   [facility]     Home Medications Prior to Admission medications   Medication Sig Start Date  End Date Taking? Authorizing Provider  acetaminophen (TYLENOL) 325 MG tablet Take by mouth. 12/16/22   [provider]  busPIRone (BUSPAR) 15 MG tablet Take 1/2 tablet (7.5 mg) in the morning and Take 1 tablet (15 mg) at supper 01/25/23   Anson Fret, MD  cefdinir (OMNICEF) 300 MG capsule Take 1 capsule (300 mg total) by mouth 2 (two) times daily. 01/30/23   Al Decant, PA-C  Cholecalciferol 1.25 MG (50000 UT) capsule Take 1 capsule by mouth once a week. 12/24/22 03/24/23  [provider]  clobetasol cream (TEMOVATE) 0.05 % Apply topically. 10/21/20   [provider]  doxycycline (VIBRAMYCIN) 100 MG capsule Take 1 capsule (100 mg total) by mouth 2 (two) times daily. 01/30/23   Al Decant, PA-C  escitalopram (LEXAPRO) 5 MG tablet Take 1 tablet (5 mg total) by mouth daily. 01/25/23   Anson Fret, MD  ezetimibe (ZETIA) 10 MG tablet Take 10 mg by mouth daily.    [provider]  furosemide (LASIX) 20 MG tablet Take 1 tablet (20 mg total) by mouth 2 (two) times daily. 07/08/22   Marinda Elk, MD  insulin regular (NOVOLIN R) 100 units/mL injection Inject 6 units with breakfast, 4 units with lunch, and 4 units with dinner plus correction dose as instructed. 11/12/22 05/14/23  [provider]  lactulose (CHRONULAC) 10 GM/15ML solution Take 15 mLs (10 g total) by mouth 2 (two) times daily as needed for mild constipation (for bowel movments 2-3 per day). Patient taking differently: Take 10 g by mouth 2 (two) times daily as needed for mild constipation (for bowel movments 2-3 per day). Pt gives usually between 7- 10 mls as needed 07/06/21   Pokhrel, Rebekah Chesterfield, MD  levETIRAcetam (KEPPRA XR) 500 MG 24 hr tablet Take 3 tablets (1,500 mg total) by mouth at bedtime. 01/25/23   Anson Fret, MD  levothyroxine (SYNTHROID) 88 MCG tablet Take 88 mcg by mouth every morning. 02/14/22   [provider]  loratadine (CLARITIN) 10 MG tablet Take 1  tablet by mouth daily. 11/09/22   [provider]  morphine 10 MG/5ML solution Take 10 mL (20 mg total) by mouth every 6-8 (six to eight) hours as needed for Pain for up to 14 days 01/20/23   [provider]  mycophenolate (CELLCEPT) 250 MG capsule Take by mouth. 12/24/22   [provider]  naloxone (NARCAN) nasal spray 4 mg/0.1 mL Place into the nose. 07/14/22   [provider]  ondansetron (ZOFRAN ODT) 4 MG disintegrating tablet  Take 1 tablet (4 mg total) by mouth every 8 (eight) hours as needed for nausea or vomiting. Patient taking differently: Take 4 mg by mouth every 8 (eight) hours as needed for nausea or vomiting (dissolve orally). 02/08/20   Maxwell Caul, PA-C  pantoprazole (PROTONIX) 20 MG tablet Take 1 tablet (20 mg total) by mouth daily. 08/16/22   Cirigliano, Vito V, DO  polyethylene glycol powder (GLYCOLAX/MIRALAX) 17 GM/SCOOP powder Take by mouth.    [provider]  predniSONE (DELTASONE) 5 MG tablet Take by mouth. 01/11/23 01/11/24  [provider]  tacrolimus (PROGRAF) 1 MG capsule Take by mouth. 01/17/23 01/17/24  [provider]  tamsulosin (FLOMAX) 0.4 MG CAPS capsule Take by mouth. 12/31/22 12/31/23  [provider]  valGANciclovir (VALCYTE) 450 MG tablet Take 900 mg by mouth daily.    [provider]      Allergies    Fentanyl, Meloxicam, Rosuvastatin, Wound dressing adhesive, Ciprofloxacin, and Penicillins    Review of Systems   Review of Systems  Physical Exam Updated Patton Signs BP (!) 100/45   Pulse 66   Temp 100.1 F (37.8 C) (Oral)   Resp 15   Ht 5\' 6"  (1.676 m)   Wt 70 kg   SpO2 100%   BMI 24.91 kg/m  Physical Exam Vitals and nursing note reviewed.  Constitutional:      General: He is not in acute distress.    Appearance: He is well-developed. He is not diaphoretic.  HENT:     Head: Normocephalic and atraumatic.  Eyes:     Extraocular Movements: EOM normal.      Conjunctiva/sclera: Conjunctivae normal.  Cardiovascular:     Rate and Rhythm: Normal rate and regular rhythm.     Heart sounds: Normal heart sounds. No murmur heard.    No friction rub. No gallop.  Pulmonary:     Effort: Pulmonary effort is normal. No respiratory distress.     Breath sounds: Normal breath sounds. No wheezing or rales.  Abdominal:     General: There is no distension.     Palpations: Abdomen is soft.     Tenderness: There is no abdominal tenderness. There is no guarding.  Musculoskeletal:        General: No edema.     Cervical back: Normal range of motion.  Skin:    General: Skin is warm and dry.  Neurological:     Mental Status: He is alert and oriented to person, place, and time.     ED Results / Procedures / Treatments   Labs (all labs ordered are listed, but only abnormal results are displayed) Labs Reviewed  COMPREHENSIVE METABOLIC PANEL - Abnormal; Notable for the following components:      Result Value   Glucose, Bld 115 (*)    Calcium 8.0 (*)    Total Protein 5.5 (*)    Albumin 2.7 (*)    Alkaline Phosphatase 248 (*)    All other components within normal limits  CBC WITH DIFFERENTIAL/PLATELET - Abnormal; Notable for the following components:   WBC 1.8 (*)    RBC 2.98 (*)    Hemoglobin 8.1 (*)    HCT 26.6 (*)    Neutro Abs 1.2 (*)    Lymphs Abs 0.5 (*)    All other components within normal limits  APTT - Abnormal; Notable for the following components:   aPTT 20 (*)    All other components within normal limits  I-STAT CHEM 8, ED -  Abnormal; Notable for the following components:   Glucose, Bld 115 (*)    Calcium, Ion 1.06 (*)    Hemoglobin 8.8 (*)    HCT 26.0 (*)    All other components within normal limits  RESP PANEL BY RT-PCR (RSV, FLU A&B, COVID)  RVPGX2  CULTURE, BLOOD (ROUTINE X 2)  CULTURE, BLOOD (ROUTINE X 2)  LIPASE, BLOOD  PROTIME-INR  URINALYSIS, W/ REFLEX TO CULTURE (INFECTION SUSPECTED)  I-STAT CG4 LACTIC ACID, ED  I-STAT  CHEM 8, ED  I-STAT CG4 LACTIC ACID, ED  I-STAT CG4 LACTIC ACID, ED  I-STAT CG4 LACTIC ACID, ED    EKG EKG Interpretation Date/Time:  Wednesday February 23 2023 07:47:59 EDT Ventricular Rate:  66 PR Interval:  144 QRS Duration:  95 QT Interval:  394 QTC Calculation: 413 R Axis:   59  Text Interpretation: Sinus rhythm Borderline low voltage, extremity leads No significant change since last tracing Confirmed by Alvira Monday ([id number]) on 02/23/2023 8:55:45 AM  Radiology DG Chest Port 1 View  Result Date: 02/23/2023 CLINICAL DATA:  Questionable sepsis-evaluate for abnormality. Fever. Abdominal pain. EXAM: PORTABLE CHEST 1 VIEW COMPARISON:  Prior chest radiograph 01/30/2023 and earlier. FINDINGS: Heart size within normal limits. Aortic atherosclerosis. Ill-defined opacity within the right upper lobe. No appreciable airspace consolidation on the left. No evidence of pleural effusion or pneumothorax. No acute osseous abnormality identified IMPRESSION: 1. Ill-defined pulmonary opacity within the right upper lobe, new from the prior examination of 01/30/2023 and compatible with pneumonia. Followup PA and lateral chest radiographs are recommended in 3-4 weeks following a trial of antibiotic therapy to ensure resolution and to exclude underlying malignancy. 2. Aortic Atherosclerosis (ICD10-I70.0). Electronically Signed   By: Jackey Loge D.O.   On: 02/23/2023 08:09   CT ABDOMEN PELVIS W CONTRAST  Result Date: 02/23/2023 CLINICAL DATA:  62 year old male with history of abdominal pain. EXAM: CT ABDOMEN AND PELVIS WITH CONTRAST TECHNIQUE: Multidetector CT imaging of the abdomen and pelvis was performed using the standard protocol following bolus administration of intravenous contrast. RADIATION DOSE REDUCTION: This exam was performed according to the departmental dose-optimization program which includes automated exposure control, adjustment of the mA and/or kV according to patient size and/or use of  iterative reconstruction technique. CONTRAST:  75mL OMNIPAQUE IOHEXOL 350 MG/ML SOLN COMPARISON:  CT of the abdomen and pelvis 01/30/2023. FINDINGS: Lower chest: Unremarkable. Hepatobiliary: Postoperative changes of prior liver transplant are again noted. Diffuse periportal edema. Scattered areas of mild intrahepatic biliary ductal dilatation, most evident in segment 2 and segment 7. Scattered low-attenuation lesions in the liver are new compared to the prior study, some well-defined (example in the superior aspect of segment 7 on axial image 13 of series 3), with others less well-defined (example in the periphery of segment 8 on axial image 12 of series 3). The largest of these lesions is in segment 7 measuring up to 2 cm in diameter (axial image 13 of series 3). Stent in the common bile duct extending into the duodenum. Pancreas: No pancreatic mass. No pancreatic ductal dilatation. Slight haziness in the peripancreatic fat adjacent to the head and uncinate process of the pancreas. No well-defined peripancreatic fluid collections. Spleen: Unremarkable. Adrenals/Urinary Tract: Bilateral kidneys and bilateral adrenal glands are normal in appearance. No hydroureteronephrosis. Urinary bladder is unremarkable in appearance. Stomach/Bowel: The appearance of the stomach is normal. A few scattered mildly dilated loops of small bowel are noted measuring up to 3.4 cm in diameter, with several small air-fluid levels. No distinct transition  point is identified. Gas and stool are noted throughout the colon and distal rectum. Postoperative changes in the left side of the abdomen from prior partial small bowel resection. Appendix is not confidently identified may be surgically absent. Vascular/Lymphatic: Aortic atherosclerosis, without evidence of aneurysm or dissection in the abdominal or pelvic vasculature. Portal vein is patent. Hepatic veins are patent. No definite lymphadenopathy noted in the abdomen or pelvis. Reproductive:  Prostate gland and seminal vesicles are unremarkable in appearance. Other: Moderate volume of ascites. No pneumoperitoneum. Postoperative changes of prior umbilical hernia repair are noted. Musculoskeletal: There are no aggressive appearing lytic or blastic lesions noted in the visualized portions of the skeleton. IMPRESSION: 1. Status post liver transplant with multiple new low to intermediate attenuation areas scattered throughout the liver, concerning for possible hepatic abscesses. In addition, there is diffuse periportal edema and scattered areas of mild intrahepatic biliary ductal dilatation. Further evaluation with abdominal MRI with and without IV gadolinium with MRCP should be considered to better evaluate these findings. 2. Scattered mildly dilated loops of small bowel and small bowel air-fluid levels. Findings could suggest early or partial small bowel obstruction. Surgical consultation is recommended. 3. Mild haziness in the peripancreatic fat adjacent to the head and uncinate process of the pancreas. This could indicate mild acute pancreatitis. Correlation with lipase levels is recommended. 4. Moderate volume of ascites. 5. Additional incidental findings, as above. Electronically Signed   By: Trudie Reed M.D.   On: 02/23/2023 07:57    Procedures .Critical Care  Performed by: Alvira Monday, MD Authorized by: Alvira Monday, MD   Critical care provider statement:    Critical care time (minutes):  30   Critical care was time spent personally by me on the following activities:  Development of treatment plan with patient or surrogate, discussions with consultants, evaluation of patient's response to treatment, examination of patient, ordering and review of laboratory studies, ordering and review of radiographic studies, ordering and performing treatments and interventions, pulse oximetry, re-evaluation of patient's condition and review of old charts     Medications Ordered in  ED Medications  lactated ringers infusion ( Intravenous New Bag/Given 02/23/23 0932)  vancomycin (VANCOREADY) IVPB 1500 mg/300 mL (1,500 mg Intravenous New Bag/Given 02/23/23 0838)  lactated ringers bolus 1,000 mL (has no administration in time range)  lactated ringers bolus 1,500 mL (0 mLs Intravenous Stopped 02/23/23 0932)  ceFEPIme (MAXIPIME) 2 g in sodium chloride 0.9 % 100 mL IVPB (0 g Intravenous Stopped 02/23/23 0841)  metroNIDAZOLE (FLAGYL) IVPB 500 mg (0 mg Intravenous Stopped 02/23/23 0932)  hydrocortisone sodium succinate (SOLU-CORTEF) 100 MG injection 200 mg (200 mg Intravenous Given 02/23/23 0753)  iohexol (OMNIPAQUE) 350 MG/ML injection 75 mL (75 mLs Intravenous Contrast Given 02/23/23 0731)    ED Course/ Medical Decision Making/ A&P                               62 year old male with a history of hypertension, liver transplantation at [facility] 10/2022 on cellcept, umbilical hernia repair 11/2022, ERCP with biliary stent 01/2023 with ERCP removal and exchange of stent 7/16, new diagnosis of adrenal insufficiency who presents with concern for fever, chills, nausea and abdominal pain.  Initial labs completed show normal lactic acid, Cr, stable anemia, no clinically significant electrolyte abnormalities.    Given fever, chills at home in setting of transplant, ordered labs and empiric antibiotics to cover sepsis of unknown etiology. Given recent procedures, transplant has  wide differential of etiologies.  Blood pressures 98/46 initially, wife reports he does have chronically low blood pressures (has noted office BP 91 systolic, however piror ed visit was 120).  Ordered 1.5L LR.  Labs completed and personally evaluated by me show transaminases WNL, bilirubin WNL.  WBC 1.8.  UA pending.  CXR evaluated by me with opacity concerning for pneumonia, recommend follow up 3-4 weeks for resolution.    CT abdomen pelvis completed given abdominal pain, hx of hernia surgery in April, biliary stent  showing with multiple new low to intermediate attenuation areas scattered throughout the liver concerning for possible hepatic abscesses, in addition there is diffuse periportal edema scattered areas of mild intrahepatic biliary ductal dilation, scattered mildly dilated loops of small bowel with air-fluid levels, could suggest early or partial small bowel obstruction, mild haziness in the peripancreatic fat, moderate ascites.  He is passing flatus, not vomiting, do not feel clinically he has SBO.  Discussed concern for sepsis and presence of suspected hepatic abscesses with [facility] given his transplant status and prior recent procedures there.  Dr. Titus Dubin Transplant [facility] accepting for transfer to [facility].  At this time he has received 2.5 L of fluid, empiric vancomycin, cefepime and Flagyl, as well as 200 mg of hydrocortisone stress dose steroid.  BP has improved, has normal lactic acid and renal function.  Will continue to monitor blood pressures as we await transfer.           Final Clinical Impression(s) / ED Diagnoses Final diagnoses:  Sepsis without acute organ dysfunction, due to unspecified organism [facility])  Hepatic abscess following liver transplantation [facility])    Rx / DC Orders ED Discharge Orders     None         Alvira Monday, MD 02/23/23 1011

## 2023-02-23 NOTE — Sepsis Progress Note (Signed)
Code Sepsis protocol being monitored by [facility]. 

## 2023-02-23 NOTE — ED Notes (Signed)
[facility] called eta 20-31mins

## 2023-02-23 NOTE — ED Triage Notes (Signed)
  Pt has recent dx of addison's disease . Pt coming in with fever,abdominal pain, nausea starting at 4am. EMS gave 4 of zofran and 650 acetaminophen.    Ems vitals Bp- 120/70 Hr- 72 Rr- 18  Spo2 97% on ra Cbg 140

## 2023-02-24 LAB — CULTURE, BLOOD (ROUTINE X 2)
Culture: NO GROWTH
Culture: NO GROWTH

## 2023-03-06 ENCOUNTER — Encounter (HOSPITAL_COMMUNITY): Payer: Self-pay

## 2023-03-06 ENCOUNTER — Emergency Department (HOSPITAL_COMMUNITY)
Admission: EM | Admit: 2023-03-06 | Discharge: 2023-03-07 | Disposition: A | Discharge status: Other Acute Inpatient Hospital | Payer: Medicare PPO | Attending: Emergency Medicine | Admitting: Emergency Medicine

## 2023-03-06 ENCOUNTER — Emergency Department (HOSPITAL_COMMUNITY): Payer: Medicare PPO

## 2023-03-06 ENCOUNTER — Other Ambulatory Visit: Payer: Self-pay

## 2023-03-06 DIAGNOSIS — D72819 Decreased white blood cell count, unspecified: Secondary | ICD-10-CM | POA: Insufficient documentation

## 2023-03-06 DIAGNOSIS — R509 Fever, unspecified: Secondary | ICD-10-CM | POA: Diagnosis present

## 2023-03-06 DIAGNOSIS — Z1152 Encounter for screening for COVID-19: Secondary | ICD-10-CM | POA: Insufficient documentation

## 2023-03-06 DIAGNOSIS — R109 Unspecified abdominal pain: Secondary | ICD-10-CM | POA: Insufficient documentation

## 2023-03-06 LAB — CBC WITH DIFFERENTIAL/PLATELET
Abs Immature Granulocytes: 0 10*3/uL (ref 0.00–0.07)
Basophils Absolute: 0 10*3/uL (ref 0.0–0.1)
Basophils Relative: 1 %
Eosinophils Absolute: 0 10*3/uL (ref 0.0–0.5)
Eosinophils Relative: 0 %
HCT: 29.1 % — ABNORMAL LOW (ref 39.0–52.0)
Hemoglobin: 8.8 g/dL — ABNORMAL LOW (ref 13.0–17.0)
Lymphocytes Relative: 29 %
Lymphs Abs: 1 10*3/uL (ref 0.7–4.0)
MCH: 26.7 pg (ref 26.0–34.0)
MCHC: 30.2 g/dL (ref 30.0–36.0)
MCV: 88.2 fL (ref 80.0–100.0)
Monocytes Absolute: 0.7 10*3/uL (ref 0.1–1.0)
Monocytes Relative: 21 %
Neutro Abs: 1.6 10*3/uL — ABNORMAL LOW (ref 1.7–7.7)
Neutrophils Relative %: 49 %
Platelets: 352 10*3/uL (ref 150–400)
RBC: 3.3 MIL/uL — ABNORMAL LOW (ref 4.22–5.81)
RDW: 17 % — ABNORMAL HIGH (ref 11.5–15.5)
WBC: 3.3 10*3/uL — ABNORMAL LOW (ref 4.0–10.5)
nRBC: 0 % (ref 0.0–0.2)
nRBC: 0 /100 WBC

## 2023-03-06 LAB — URINALYSIS, ROUTINE W REFLEX MICROSCOPIC
Bilirubin Urine: NEGATIVE
Glucose, UA: NEGATIVE mg/dL
Hgb urine dipstick: NEGATIVE
Ketones, ur: NEGATIVE mg/dL
Leukocytes,Ua: NEGATIVE
Nitrite: NEGATIVE
Protein, ur: NEGATIVE mg/dL
Specific Gravity, Urine: 1.01 (ref 1.005–1.030)
pH: 7 (ref 5.0–8.0)

## 2023-03-06 LAB — COMPREHENSIVE METABOLIC PANEL
ALT: 14 U/L (ref 0–44)
AST: 17 U/L (ref 15–41)
Albumin: 3.2 g/dL — ABNORMAL LOW (ref 3.5–5.0)
Alkaline Phosphatase: 143 U/L — ABNORMAL HIGH (ref 38–126)
Anion gap: 12 (ref 5–15)
BUN: 18 mg/dL (ref 8–23)
CO2: 23 mmol/L (ref 22–32)
Calcium: 8.5 mg/dL — ABNORMAL LOW (ref 8.9–10.3)
Chloride: 102 mmol/L (ref 98–111)
Creatinine, Ser: 1.13 mg/dL (ref 0.61–1.24)
GFR, Estimated: >60 mL/min (ref >60)
Glucose, Bld: 99 mg/dL (ref 70–99)
Potassium: 4.6 mmol/L (ref 3.5–5.1)
Sodium: 137 mmol/L (ref 135–145)
Total Bilirubin: 0.7 mg/dL (ref 0.3–1.2)
Total Protein: 6.2 g/dL — ABNORMAL LOW (ref 6.5–8.1)

## 2023-03-06 LAB — LACTIC ACID, PLASMA: Lactic Acid, Venous: 1.1 mmol/L (ref 0.5–1.9)

## 2023-03-06 LAB — SARS CORONAVIRUS 2 BY RT PCR: SARS Coronavirus 2 by RT PCR: NEGATIVE

## 2023-03-06 MED ORDER — ACETAMINOPHEN 325 MG PO TABS: 650.0000 mg | ORAL_TABLET | Freq: Once | ORAL | Status: DC

## 2023-03-06 NOTE — ED Provider Notes (Signed)
[place] EMERGENCY DEPARTMENT AT [facility] Provider Note   CSN: [id number] Arrival date & time: 03/06/23  2048     History Liver transplant, Anxiety,  Chief Complaint  Patient presents with   Fever    Fever started today 101.2 oral, took 650mg  tylenol PO at 4pm, liver transplant in march, now with new abcess to new liver on 3 different antibiotics already.   Abdominal Pain    Andrew Patton is a 62 y.o. male.  62 y.o male with a PMH of Liver transplant on March 2024, and recent discharge due to new liver lesions noted on 7/29 presents to the ED with a chief complaint of fever since this am. He is currently on tylenol 650 Q8 hours, last tylenol dose around 1600, he is not allowed to take motrin per his [facility] team. He had been feeling fine since his discharge from the hospital but today he began to experience a fever. States he has not had any changes in his symptoms.  He does not have any chest pain, no shortness of breath, chronic abdominal pain but no nausea or vomiting.  Febrile on arrival with a temperature 1-1.2 orally.  The history is provided by the patient.  Fever Max temp prior to arrival:  101.2 Associated symptoms: no chest pain, no headaches, no nausea, no sore throat and no vomiting   Abdominal Pain Associated symptoms: fever   Associated symptoms: no chest pain, no nausea, no shortness of breath, no sore throat and no vomiting        Home Medications Prior to Admission medications   Medication Sig Start Date End Date Taking? Authorizing Provider  acetaminophen (TYLENOL) 325 MG tablet Take 650 mg by mouth every 8 (eight) hours. 12/16/22  Yes [provider]  acyclovir (ZOVIRAX) 200 MG capsule Take 400 mg by mouth every 12 (twelve) hours. 02/27/23  Yes [provider]  amLODipine (NORVASC) 5 MG tablet Take 5 mg by mouth daily. 03/01/23 02/29/24 Yes [provider]  amoxicillin-clavulanate (AUGMENTIN) 875-125 MG tablet Take 1 tablet  by mouth every 12 (twelve) hours. 02/28/23 04/01/23 Yes [provider]  aspirin EC 81 MG tablet Take 81 mg by mouth daily.   Yes [provider]  busPIRone (BUSPAR) 15 MG tablet Take 1/2 tablet (7.5 mg) in the morning and Take 1 tablet (15 mg) at supper 01/25/23  Yes Anson Fret, MD  Calcium Citrate-Vitamin D 315-5 MG-MCG TABS Take 2 tablets by mouth in the morning and at bedtime. 02/08/23  Yes [provider]  clobetasol (TEMOVATE) 0.05 % external solution Apply 1 Application topically 2 (two) times daily as needed (for psoriasis). 02/28/23 02/28/24 Yes [provider]  escitalopram (LEXAPRO) 5 MG tablet Take 1 tablet (5 mg total) by mouth daily. 01/25/23  Yes Anson Fret, MD  ezetimibe (ZETIA) 10 MG tablet Take 10 mg by mouth daily.   Yes [provider]  gabapentin (NEURONTIN) 300 MG capsule Take 600 mg by mouth 3 (three) times daily. 02/08/23  Yes [provider]  hydrocortisone (CORTEF) 5 MG tablet Take 5-10 mg by mouth See admin instructions. Take 2 tablets by mouth in the morning and 1 tablet in the evening. Double your dose as needed for sick days. 02/18/23 02/18/24 Yes [provider]  insulin regular (NOVOLIN R) 100 units/mL injection Inject 2 Units into the skin 3 (three) times daily with meals. Per sliding scale 11/12/22 05/14/23 Yes [provider]  lactulose (CHRONULAC) 10 GM/15ML  solution Take 15 mLs (10 g total) by mouth 2 (two) times daily as needed for mild constipation (for bowel movments 2-3 per day). Patient taking differently: Take 10 g by mouth 2 (two) times daily as needed for mild constipation (for bowel movments 2-3 per day). Pt gives usually between 7- 10 mls as needed 07/06/21  Yes Pokhrel, Laxman, MD  Letermovir 480 MG TABS Take 480 mg by mouth daily. 02/28/23  Yes [provider]  levETIRAcetam (KEPPRA XR) 500 MG 24 hr tablet Take 3 tablets (1,500 mg total) by mouth at bedtime. 01/25/23  Yes Anson Fret, MD  levothyroxine (SYNTHROID) 88 MCG tablet Take 88 mcg by mouth every morning. 02/14/22  Yes [provider]  lidocaine 4 % Place 1 patch onto the skin at bedtime. 02/28/23 03/30/23 Yes [provider]  loperamide (IMODIUM) 2 MG capsule Take 2 mg by mouth 3 (three) times daily as needed for diarrhea or loose stools. 02/28/23  Yes [provider]  melatonin 3 MG TABS tablet Take 3 mg by mouth at bedtime.   Yes [provider]  methocarbamol (ROBAXIN) 500 MG tablet Take 500 mg by mouth every 8 (eight) hours as needed for muscle spasms. 02/08/23 03/10/23 Yes [provider]  morphine 10 MG/5ML solution Take by mouth every 12 (twelve) hours as needed for severe pain. 7.5 mL 01/20/23  Yes [provider]  mycophenolate (CELLCEPT) 250 MG capsule Take 500 mg by mouth 2 (two) times daily. 12/24/22  Yes [provider]  ondansetron (ZOFRAN ODT) 4 MG disintegrating tablet Take 1 tablet (4 mg total) by mouth every 8 (eight) hours as needed for nausea or vomiting. Patient taking differently: Take 4 mg by mouth every 8 (eight) hours as needed for nausea or vomiting (dissolve orally). 02/08/20  Yes Maxwell Caul, PA-C  pantoprazole (PROTONIX) 40 MG tablet Take 40 mg by mouth daily. 11/23/22 05/22/23 Yes [provider]  polyethylene glycol powder (GLYCOLAX/MIRALAX) 17 GM/SCOOP powder Take 17 g by mouth daily as needed for mild constipation.   Yes [provider]  Specialty Vitamins Products (MG PLUS PROTEIN) 133 MG TABS Take 2 tablets by mouth in the morning and at bedtime. 01/18/23 04/18/23 Yes [provider]  tacrolimus (PROGRAF) 1 MG capsule Take 2-3 mg by mouth See admin instructions. Take 2 capsules by mouth in the morning and 3 capsules at night 01/17/23 01/17/24 Yes [provider]  tamsulosin (FLOMAX) 0.4 MG CAPS capsule Take 0.4 mg by mouth at bedtime. 12/31/22 12/31/23 Yes [provider]  traZODone  (DESYREL) 50 MG tablet Take 50 mg by mouth at bedtime. 02/22/23 03/08/23 Yes [provider]  ursodiol (ACTIGALL) 300 MG capsule Take 300 mg by mouth 2 (two) times daily.   Yes [provider]  Cholecalciferol 1.25 MG (50000 UT) capsule Take 1 capsule by mouth once a week. Patient not taking: Reported on 03/06/2023 12/24/22 03/24/23  [provider]  furosemide (LASIX) 20 MG tablet Take 1 tablet (20 mg total) by mouth 2 (two) times daily. Patient not taking: Reported on 03/06/2023 07/08/22   Marinda Elk, MD  naloxone [facility]) nasal spray 4 mg/0.1 mL Place into the nose. Patient not taking: Reported on 03/06/2023 07/14/22   [provider]  predniSONE (DELTASONE) 5 MG tablet Take by mouth. Patient not taking: Reported on 03/06/2023 01/11/23 01/11/24  [provider]  SOLU-CORTEF 100 MG injection Inject 100 mg into the muscle once as needed (for temp >104). Patient  not taking: Reported on 03/06/2023 02/18/23   [provider]  valGANciclovir (VALCYTE) 450 MG tablet Take 900 mg by mouth daily. Patient not taking: Reported on 03/06/2023    [provider]      Allergies    Fentanyl, Meloxicam, Rosuvastatin, Wound dressing adhesive, Ciprofloxacin, and Penicillins    Review of Systems   Review of Systems  Constitutional:  Positive for fever.  HENT:  Negative for sore throat.   Respiratory:  Negative for shortness of breath.   Cardiovascular:  Negative for chest pain.  Gastrointestinal:  Positive for abdominal pain (chronic). Negative for nausea and vomiting.  Genitourinary:  Negative for flank pain.  Musculoskeletal:  Negative for back pain.  Neurological:  Negative for headaches.  All other systems reviewed and are negative.   Physical Exam Updated Vital Signs BP 137/63   Pulse 75   Temp (!) 101.2 F (38.4 C) (Oral)   Resp 16   SpO2 100%  Physical Exam Vitals and nursing note reviewed.  Constitutional:      Appearance: He is  well-developed.  HENT:     Head: Normocephalic and atraumatic.  Cardiovascular:     Rate and Rhythm: Normal rate.  Pulmonary:     Effort: Pulmonary effort is normal.     Breath sounds: No wheezing or rales.  Abdominal:     General: Abdomen is flat. A surgical scar is present. Bowel sounds are decreased. There is no distension.     Palpations: Abdomen is soft.     Tenderness: There is abdominal tenderness. There is no guarding or rebound.  Skin:    General: Skin is warm and dry.  Neurological:     Mental Status: He is alert and oriented to person, place, and time.     ED Results / Procedures / Treatments   Labs (all labs ordered are listed, but only abnormal results are displayed) Labs Reviewed  CBC WITH DIFFERENTIAL/PLATELET - Abnormal; Notable for the following components:      Result Value   WBC 3.3 (*)    RBC 3.30 (*)    Hemoglobin 8.8 (*)    HCT 29.1 (*)    RDW 17.0 (*)    Neutro Abs 1.6 (*)    All other components within normal limits  COMPREHENSIVE METABOLIC PANEL - Abnormal; Notable for the following components:   Calcium 8.5 (*)    Total Protein 6.2 (*)    Albumin 3.2 (*)    Alkaline Phosphatase 143 (*)    All other components within normal limits  SARS CORONAVIRUS 2 BY RT PCR  CULTURE, BLOOD (ROUTINE X 2)  CULTURE, BLOOD (ROUTINE X 2)  LACTIC ACID, PLASMA  LACTIC ACID, PLASMA  URINALYSIS, ROUTINE W REFLEX MICROSCOPIC  CBG MONITORING, ED    EKG None  Radiology DG Chest Portable 1 View  Result Date: 03/06/2023 CLINICAL DATA:  Fever. EXAM: PORTABLE CHEST 1 VIEW COMPARISON:  02/23/2023. FINDINGS: The heart size and mediastinal contours are within normal limits. Mild atherosclerotic calcification of the aorta is present. Lung volumes are low. No consolidation, effusion, or pneumothorax. Surgical clips are noted in the upper abdomen. There is partial visualization of the biliary stent in the right upper quadrant. No acute osseous abnormality. IMPRESSION: No  active disease. Electronically Signed   By: Thornell Sartorius M.D.   On: 03/06/2023 22:27    Procedures Procedures    Medications Ordered in ED Medications  acetaminophen (TYLENOL) tablet 650 mg (has no administration in time range)  ED Course/ Medical Decision Making/ A&P                                Medical Decision Making Amount and/or Complexity of Data Reviewed Labs: ordered. Radiology: ordered.  Risk OTC drugs.   This patient presents to the ED for concern of fever, this involves a number of treatment options, and is a complaint that carries with it a high risk of complications and morbidity.    Co morbidities: Discussed in HPI   Brief History:  See HPI.   EMR reviewed including pt PMHx, past surgical history and past visits to ER.   See HPI for more details   Lab Tests:  I ordered and independently interpreted labs.  The pertinent results include:    CBC with leukopenia of 3.3, hemoglobin is at his baseline of 8.8.  CMP with no electrolyte derangement, current levels unremarkable.  LFTs are within normal limits.  Lactic acid is negative.  COVID-19 is also negative.   Imaging Studies:  NAD. I personally reviewed all imaging studies and no acute abnormality found. I agree with radiology interpretation.  Cardiac Monitoring: N/A  Medicines ordered:  I ordered medication including 650 tylenol  for pyrexia Reevaluation of the patient after these medicines showed that the patient stayed the same I have reviewed the patients home medicines and have made adjustments as needed  Consults:  9:31 P,I requested consultation with [facility] Dr. Adela Lank (hepatologist),  and discussed lab and imaging findings as well as pertinent plan - they recommend: Transfer to [facility] as patient is highly complex and is 5 months status post transplant.  Patient is also only able to have 3 g of Tylenol per day, he is not allowed to have any NSAIDs. He can be provided with additional  tylenol while in the ED for pyrexia.  Will need demographic sheet faxed over [phone] fax   Reevaluation:  After the interventions noted above I re-evaluated patient and found that they have :stayed the same   Social Determinants of Health:  The patient's social determinants of health were a factor in the care of this patient   Problem List / ED Course:  Patient here from home with fever which began today oral temperature one 1.2 on arrival to the ED, last dose of Tylenol 650 mg around 4 PM today, noted to only have 650 every 8 per his transplant team recommendations.  His son reports he was discharged on the 29th with Alimentum and into other different antibiotics.  He does not have any sores, no upper respiratory symptoms, COVID-19 test is negative.  X-ray without any signs of pneumonia.  Blood work is consistent with his prior with leukopenia, hemoglobin stable.  LFTs are within normal limits.  Lactic acid has been negative.  I discussed this case with Dr. Adela Lank hepatologist who recommended patient needing higher level of care as he is 5 months post liver transplant.  Transfer center has been notified, and they will be calling with a bed once this becomes available.  Patient is hemodynamically stable for transfer at this time. BP 137/63   Pulse 75   Temp (!) 101.2 F (38.4 C) (Oral)   Resp 16   SpO2 100%    Dispostion:  After consideration of the diagnostic results and the patients response to treatment, I feel that the patent would benefit from transfer to [facility] health for higher care.   Portions of  this note were generated with Scientist, clinical (histocompatibility and immunogenetics). Dictation errors may occur despite best attempts at proofreading.   Final Clinical Impression(s) / ED Diagnoses Final diagnoses:  Fever, unspecified fever cause    Rx / DC Orders ED Discharge Orders     None

## 2023-03-07 DIAGNOSIS — R509 Fever, unspecified: Secondary | ICD-10-CM | POA: Diagnosis present

## 2023-03-07 DIAGNOSIS — D72819 Decreased white blood cell count, unspecified: Secondary | ICD-10-CM | POA: Diagnosis not present

## 2023-03-07 DIAGNOSIS — Z1152 Encounter for screening for COVID-19: Secondary | ICD-10-CM | POA: Diagnosis not present

## 2023-03-07 DIAGNOSIS — R109 Unspecified abdominal pain: Secondary | ICD-10-CM | POA: Diagnosis not present

## 2023-03-07 NOTE — ED Notes (Signed)
[facility] accepted transport but unknown ETA

## 2023-04-14 ENCOUNTER — Ambulatory Visit: Payer: BC Managed Care – PPO | Admitting: Adult Health

## 2023-04-14 ENCOUNTER — Telehealth: Payer: Self-pay | Admitting: Neurology

## 2023-04-14 NOTE — Telephone Encounter (Signed)
Pt came into office was not made aware that appt was canceled 04/14/2023. States Dr. Lucia Gaskins wanted to see him soon to reduce keppra and monitor him after liver transplant. Would like to know if he needs to be seen sooner than what he is scheduled for now 05/25/2023.

## 2023-04-14 NOTE — Telephone Encounter (Signed)
I spoke with the patient.  He is doing okay.  He has good days and bad days.  Denies any seizure activity since the last visit.  He states his [facility] physicians would like for him to follow-up with our office sooner than what was scheduled for December so he can start coming off the Keppra. He remains on Tacrolimus.  He does notice some difficulty sleeping.  He is on antidepressants and states he feels under control most days in that regard.  He is okay with October appointment but would like to be on waitlist.  I added him.  Patient verbalized appreciation for the call.   Message sent to Dr. Lucia Gaskins as Lorain Childes.  He needs to be seen sooner we will see if anything has become available.

## 2023-04-15 NOTE — Telephone Encounter (Signed)
I believe patient could see an NP as well, thank you

## 2023-04-18 NOTE — Telephone Encounter (Signed)
Spoke with patient. He will now see Megan NP on 9/23 at 9:30 AM arrival 9.

## 2023-04-24 NOTE — Progress Notes (Unsigned)
PATIENT: Andrew Patton DOB: 1960/08/21  REASON FOR VISIT: follow up HISTORY FROM: patient PRIMARY NEUROLOGIST: Dr. Lucia Gaskins   No chief complaint on file.    HISTORY OF PRESENT ILLNESS: Today 04/24/23:   VEDH Patton is a 62 y.o. male with a history of ***. Returns today for follow-up.     01/25/2023: Patient with seizures and recent liver transplant. Returns today for follow up. He is on tacrolimus and Keppra was increased to 1500XR. He is doing great after the liver transport, then had emergency surgery for hernia. Eveythng is fine, no seizures, doing well on 1500mg  of keppra. Would keep him on medication one year and then consider decreasing or stopping, seizures were likely due to elevated ammonia and liver disease.   Patient complains of symptoms per HPI as well as the following symptoms: none . Pertinent negatives and positives per HPI. All others negative    09/16/22: Andrew Patton is a 62 y.o. male with a history of cognitive deficits and seizures. Returns today for follow-up. Continues to have short term memory issues.   In February had an episode where he blanked and lost track of where he was but as soon as his wife speaks to him and then he typically will get back to normal. Reports that last night he was a little off according to his wife but he had a long day d/t paracentesis.  His wife states that these episodes are not the same as his seizure events.  She feels that it is related to his liver and everything he has going on currently.  Patient will be having a liver transplant due to cirrhosis of the liver.  He will be placed on tacrolimus.  Dr. Lucia Gaskins spoke to his surgeon and Keppra XR will be increased to 1500 mg at bedtime when he is started on this medication.   06/17/22: Andrew Patton is a 62 y.o. male who has been followed in this office for cognitive deficits and seizures. Returns today for follow-up. Wife feels that he has had mild "seizure events." She  describes as fogginess (dazed look). Last for seconds- wife states that when she speaks to him he comes out of this. Wife feels that this is due to recent liver issues.   Andrew Patton and broke his nose 9/15. Last night he slipped in the tub and hit his head. Not on any blood thinners. Patient wife reports that she has not noticed any residual symptoms after he fell.  Recent infection was in hospital for 2 weeks. Had peritonitis. Now on Batrim.  Patient's wife feels that he is slowly getting back to his baseline.  Still notices trouble with memory. Wife helps with ADLs due to physical limitations. Wife helps with appointments and medications. Not on any memory medication.  Wife feels that his cognition worsened after his hospitalization but is slowly getting better.  HISTORY Dec 10, 2021: Patient is here for follow-up after formal neurocognitive testing per Dr. Tamsen Snider.  I reviewed his notes as below.  Luckily there is no indication of Alzheimer's or Lewy body dementia or other neurodegenerative dementia.  However he does have significant cognitive deficits.  We discussed conservative measures and most of all managing his medical conditions which she is doing very well.  I answered all the questions as to the best of my ability.  He was here with his wife who also provided much information.  Discussed returning to primary care, patient and wife would like to continue  following with [name], we will continue to follow him every 6 months for the next year to ensure his stability.   Dr. Kieth Brightly: Impression/Diagnosis:                     The results of the current neuropsychological evaluation are consistent between the patient's reports and his wife reports about significant cognitive difficulties.  However, the obtained scores were significantly below and worse than the descriptions provided by the patient and his wife.  The patient was so severely impaired during the objective assessment that it would have been  very difficult for him to manage even basic ADL type functions around his house.  I do think that it is difficult to assess effectively areas outside of those primarily related to attentional deficits particularly for auditory and visual encoding and focus execute capacity.  These are so impaired that they had a significant impact on all other cognitive areas assessed.  The patient does clearly have significant cognitive deficits with significant memory and attentional deficits present.  This pattern would be consistent with postconcussion syndrome types of deficits but they are will be on what would be expected typically from a concussive event and isolation.  As far as diagnostic considerations, the patient presents with a very complicated medical and neuropsychological picture.  The patient has had multiple concussive events going back over 3 decades.  However, the most changes were noted after a significant fall in September 2021.  The patient began having seizure-like events that were consistent with complex partial type seizures.  He has responded very well to Keppra as far as reducing these events.  He would have acute confusion, disorientation and balance issues during these times.  While neuro imaging including MRIs did not show any observable abnormalities I suspect that the patient has had some degree of diffuse axonal injury as his cognitive deficits go well beyond those typically seen with lobar white matter injury slowly.  I do, however, think that there are other issues going on that are contributing and worsening his symptoms although some of which we are not going to be able to avoid.  The patient has responded very well to Keppra and this is decreased his acute seizure/confusional events.  However, due to his anxiety and depressive symptomatology and sleep difficulties he has continued to take benzo diazepam such as Ambien, clonazepam as well as Keppra for his seizures.  The patient has  longstanding sleep disturbance but was not found to have sleep apnea.  During one of his acute events [place] the winery he had an elevated blood glucose level of 242 which is significant although I suspect that his baseline is relatively high to begin with.  Thyroid issues, chronic insomnia, treatment of his anxiety with benzo diazepam, seizures and the focal injury or location contributing to the seizure events are all likely producing the primary complex of symptoms resulting in his subjective experiences.  The primary area of cognitive deficits around attention and focus execute abilities would suggest primary frontal lobe involvement and his deficits in the degree of other areas including temporal lobe functioning are very difficult to ascertain as his attentional deficits are so significant profound that almost all other neuropsychological measures are going to be disproportionately and significantly impacted due to his difficulty keeping instructions and directions straight during various tasks.  As far as treatment recommendations it will be important for the patient to continue to work on his depression and anxiety.  He should also  continue close monitoring on metabolic issues including his diabetes and thyroid issues and overall liver function etc.  Whenever possible I would suggest he limit his benzo diazepam use if possible as they may exacerbate some of his memory and attentional issues.  While clearly his Keppra use is not the cause of his overall cognitive dysfunction and is essential for managing his seizure activity it may also be playing some role in his cognitive issues but his attentional and memory deficits are so significant that they could not be explained by typical side effects from Keppra.   I will sit down with the patient and go over all the results of the current neuropsychological evaluation.  We will address issues related to how to better compensate for his significant attentional  issues and encoding deficits and explain how these areas of cognitive functioning are good impact other life details.  Good dietary habits, working on improving his sleep anyway we can and maintaining sustained physical activity will be important overall.  REVIEW OF SYSTEMS: Out of a complete 14 system review of symptoms, the patient complains only of the following symptoms, and all other reviewed systems are negative.  ALLERGIES: Allergies  Allergen Reactions   Fentanyl Shortness Of Breath    Was given larger doses   Meloxicam Other (See Comments)    Leg pain   Rosuvastatin Other (See Comments)    Elevated liver enzymes   Wound Dressing Adhesive     Tape causing abrasions on his skin    Ciprofloxacin Rash   Penicillins Rash    Has patient had a PCN reaction causing immediate rash, facial/tongue/throat swelling, SOB or lightheadedness with hypotension: No Has patient had a PCN reaction causing severe rash involving mucus membranes or skin necrosis: No Has patient had a PCN reaction that required hospitalization: No Has patient had a PCN reaction occurring within the last 10 years: No If all of the above answers are "NO", then may proceed with Cephalosporin use. Other reaction(s): rash    HOME MEDICATIONS: Outpatient Medications Prior to Visit  Medication Sig Dispense Refill   acetaminophen (TYLENOL) 325 MG tablet Take 650 mg by mouth every 8 (eight) hours.     acyclovir (ZOVIRAX) 200 MG capsule Take 400 mg by mouth every 12 (twelve) hours.     amLODipine (NORVASC) 5 MG tablet Take 5 mg by mouth daily.     aspirin EC 81 MG tablet Take 81 mg by mouth daily.     busPIRone (BUSPAR) 15 MG tablet Take 1/2 tablet (7.5 mg) in the morning and Take 1 tablet (15 mg) at supper 135 tablet 4   Calcium Citrate-Vitamin D 315-5 MG-MCG TABS Take 2 tablets by mouth in the morning and at bedtime.     clobetasol (TEMOVATE) 0.05 % external solution Apply 1 Application topically 2 (two) times daily as  needed (for psoriasis).     escitalopram (LEXAPRO) 5 MG tablet Take 1 tablet (5 mg total) by mouth daily. 90 tablet 4   ezetimibe (ZETIA) 10 MG tablet Take 10 mg by mouth daily.     furosemide (LASIX) 20 MG tablet Take 1 tablet (20 mg total) by mouth 2 (two) times daily. (Patient not taking: Reported on 03/06/2023) 60 tablet 3   gabapentin (NEURONTIN) 300 MG capsule Take 600 mg by mouth 3 (three) times daily.     hydrocortisone (CORTEF) 5 MG tablet Take 5-10 mg by mouth See admin instructions. Take 2 tablets by mouth in the morning and 1 tablet in  the evening. Double your dose as needed for sick days.     insulin regular (NOVOLIN R) 100 units/mL injection Inject 2 Units into the skin 3 (three) times daily with meals. Per sliding scale     lactulose (CHRONULAC) 10 GM/15ML solution Take 15 mLs (10 g total) by mouth 2 (two) times daily as needed for mild constipation (for bowel movments 2-3 per day). (Patient taking differently: Take 10 g by mouth 2 (two) times daily as needed for mild constipation (for bowel movments 2-3 per day). Pt gives usually between 7- 10 mls as needed)     Letermovir 480 MG TABS Take 480 mg by mouth daily.     levETIRAcetam (KEPPRA XR) 500 MG 24 hr tablet Take 3 tablets (1,500 mg total) by mouth at bedtime. 270 tablet 4   levothyroxine (SYNTHROID) 88 MCG tablet Take 88 mcg by mouth every morning.     loperamide (IMODIUM) 2 MG capsule Take 2 mg by mouth 3 (three) times daily as needed for diarrhea or loose stools.     melatonin 3 MG TABS tablet Take 3 mg by mouth at bedtime.     morphine 10 MG/5ML solution Take by mouth every 12 (twelve) hours as needed for severe pain. 7.5 mL     mycophenolate (CELLCEPT) 250 MG capsule Take 500 mg by mouth 2 (two) times daily.     naloxone (NARCAN) nasal spray 4 mg/0.1 mL Place into the nose. (Patient not taking: Reported on 03/06/2023)     ondansetron (ZOFRAN ODT) 4 MG disintegrating tablet Take 1 tablet (4 mg total) by mouth every 8 (eight) hours  as needed for nausea or vomiting. (Patient taking differently: Take 4 mg by mouth every 8 (eight) hours as needed for nausea or vomiting (dissolve orally).) 6 tablet 0   pantoprazole (PROTONIX) 40 MG tablet Take 40 mg by mouth daily.     polyethylene glycol powder (GLYCOLAX/MIRALAX) 17 GM/SCOOP powder Take 17 g by mouth daily as needed for mild constipation.     predniSONE (DELTASONE) 5 MG tablet Take by mouth. (Patient not taking: Reported on 03/06/2023)     SOLU-CORTEF 100 MG injection Inject 100 mg into the muscle once as needed (for temp >104). (Patient not taking: Reported on 03/06/2023)     tacrolimus (PROGRAF) 1 MG capsule Take 2-3 mg by mouth See admin instructions. Take 2 capsules by mouth in the morning and 3 capsules at night     tamsulosin (FLOMAX) 0.4 MG CAPS capsule Take 0.4 mg by mouth at bedtime.     ursodiol (ACTIGALL) 300 MG capsule Take 300 mg by mouth 2 (two) times daily.     valGANciclovir (VALCYTE) 450 MG tablet Take 900 mg by mouth daily. (Patient not taking: Reported on 03/06/2023)     No facility-administered medications prior to visit.    PAST MEDICAL HISTORY: Past Medical History:  Diagnosis Date   Allergy    Anxiety    Arthritis    Ascites    Elevated transaminase level    Fatigue    Fatty liver    Hyperlipidemia    Hypertension    "under control", not on any blood pressure medication, has lost 60 lbs   Hypothyroidism    on synthroid   Inguinal muscle strain    Insomnia    Left knee pain    "long time ago"   Multinodular goiter    Obesity    Pneumonia    Psoriasis    Tinnitus  PAST SURGICAL HISTORY: Past Surgical History:  Procedure Laterality Date   APPENDECTOMY  1976   FOOT SURGERY Bilateral 1975   IR PARACENTESIS  11/25/2021   IR PARACENTESIS  12/02/2021   IR PARACENTESIS  12/17/2021   IR PARACENTESIS  01/05/2022   IR PARACENTESIS  02/04/2022   IR PARACENTESIS  02/15/2022   IR PARACENTESIS  03/03/2022   IR PARACENTESIS  03/10/2022   IR PARACENTESIS   03/16/2022   IR PARACENTESIS  03/23/2022   IR PARACENTESIS  03/29/2022   IR PARACENTESIS  04/06/2022   IR PARACENTESIS  04/20/2022   IR PARACENTESIS  05/04/2022   IR PARACENTESIS  05/20/2022   IR PARACENTESIS  05/31/2022   IR PARACENTESIS  06/11/2022   IR PARACENTESIS  06/18/2022   IR PARACENTESIS  06/25/2022   IR PARACENTESIS  07/02/2022   IR PARACENTESIS  07/09/2022   IR PARACENTESIS  07/20/2022   IR PARACENTESIS  07/27/2022   IR PARACENTESIS  08/03/2022   IR PARACENTESIS  08/09/2022   IR PARACENTESIS  08/13/2022   IR PARACENTESIS  08/17/2022   IR PARACENTESIS  08/20/2022   IR PARACENTESIS  08/23/2022   IR PARACENTESIS  08/26/2022   IR PARACENTESIS  08/30/2022   IR PARACENTESIS  09/02/2022   IR PARACENTESIS  09/07/2022   IR PARACENTESIS  09/10/2022   IR PARACENTESIS  09/15/2022   IR PARACENTESIS  09/20/2022   IR PARACENTESIS  09/23/2022   IR PARACENTESIS  09/28/2022   IR RADIOLOGIST EVAL & MGMT  12/17/2021   IR RADIOLOGIST EVAL & MGMT  12/31/2021   NOSE SURGERY     ROTATOR CUFF REPAIR Left    left   SHOULDER SURGERY Right 12/20/2019   [facility]     FAMILY HISTORY: Family History  Problem Relation Age of Onset   Breast cancer Mother    Hyperlipidemia Mother    Other Mother        ? form of parkinson's    Parkinsonism Mother    Bladder Cancer Father    Parkinson's disease Father    Memory loss Maternal Grandmother        in her 90s   Birth defects Maternal Grandfather    Birth defects Paternal Grandmother    Colon cancer Neg Hx    Esophageal cancer Neg Hx    Rectal cancer Neg Hx    Stomach cancer Neg Hx    Colon polyps Neg Hx    Dementia Neg Hx    Alzheimer's disease Neg Hx     SOCIAL HISTORY: Social History   Socioeconomic History   Marital status: Married    Spouse name: Not on file   Number of children: 2   Years of education: Not on file   Highest education level: Associate degree: academic program  Occupational History   Occupation: Airline pilot  Tobacco Use    Smoking status: Former    Current packs/day: 0.00    Types: Cigarettes    Quit date: 03/27/1994    Years since quitting: 29.0   Smokeless tobacco: Former    Types: Chew   Tobacco comments:    did chew as a teenager  Vaping Use   Vaping status: Never Used  Substance and Sexual Activity   Alcohol use: Not Currently    Alcohol/week: 2.0 standard drinks of alcohol    Types: 2 Glasses of wine per week   Drug use: No   Sexual activity: Not on file  Other Topics Concern   Not on file  Social History Narrative   Lives at home with wife    Right handed   Caffeine: maybe 1-3 cups/week   Social Determinants of Health   Financial Resource Strain: High Risk (03/25/2023)   Received from [facility]   Overall Financial Resource Strain (CARDIA)    Difficulty of Paying Living Expenses: Very hard  Food Insecurity: No Food Insecurity (03/25/2023)   Received from [facility]   Hunger Vital Sign    Worried About Running Out of Food in the Last Year: Never true    Ran Out of Food in the Last Year: Never true  Transportation Needs: No Transportation Needs (03/25/2023)   Received from [facility] - Transportation    In the past 12 months, has lack of transportation kept you from medical appointments or from getting medications?: No    Lack of Transportation (Non-Medical): No  Physical Activity: Not on file  Stress: Not on file  Social Connections: Unknown (12/15/2021)   Received from [facility]   Social Network    Social Network: Not on file  Intimate Partner Violence: Not At Risk (06/01/2022)   Humiliation, Afraid, Rape, and Kick questionnaire    Fear of Current or Ex-Partner: No    Emotionally Abused: No    Physically Abused: No    Sexually Abused: No      PHYSICAL EXAM  There were no vitals filed for this visit.   There is no height or weight on file to calculate BMI.     09/16/2022    8:47 AM  06/17/2022    9:04 AM 09/04/2020    7:57 AM  MMSE - Mini Mental State Exam  Orientation to time 5 3 4   Orientation to Place 5 4 5   Registration 3 3 3   Attention/ Calculation 1 2 5   Recall 3 3 3   Language- name 2 objects 2 2 2   Language- repeat 1 1 1   Language- follow 3 step command 3 3 3   Language- read & follow direction 1 1 1   Write a sentence 0 1 1  Copy design 0 0 1  Total score 24 23 29      Generalized: Well developed, in no acute distress   Neurological examination  Mentation: Alert oriented to time, place, history taking. Follows all commands speech and language fluent Cranial nerve II-XII: Pupils were equal round reactive to light. Extraocular movements were full, visual field were full on confrontational test. Facial sensation and strength were normal.. Head turning and shoulder shrug  were normal and symmetric. Motor: The motor testing reveals 5 over 5 strength of all 4 extremities. Good symmetric motor tone is noted throughout.  Sensory: Sensory testing is intact to soft touch on all 4 extremities. No evidence of extinction is noted.  Coordination: Cerebellar testing reveals good finger-nose-finger and heel-to-shin bilaterally.  Gait and station: Gait is slow and slightly unsteady   DIAGNOSTIC DATA (LABS, IMAGING, TESTING) - I reviewed patient records, labs, notes, testing and imaging myself where available.  Lab Results  Component Value Date   WBC 3.3 (L) 03/06/2023   HGB 8.8 (L) 03/06/2023   HCT 29.1 (L) 03/06/2023   MCV 88.2 03/06/2023   PLT 352 03/06/2023      Component Value Date/Time   NA 137 03/06/2023 2130   NA 138 11/13/2020 1153   K 4.6 03/06/2023 2130   CL 102 03/06/2023 2130   CO2 23 03/06/2023 2130   GLUCOSE  99 03/06/2023 2130   BUN 18 03/06/2023 2130   BUN 6 11/13/2020 1153   CREATININE 1.13 03/06/2023 2130   CALCIUM 8.5 (L) 03/06/2023 2130   PROT 6.2 (L) 03/06/2023 2130   PROT 7.2 11/13/2020 1153   ALBUMIN 3.2 (L) 03/06/2023 2130    ALBUMIN 4.4 11/13/2020 1153   AST 17 03/06/2023 2130   ALT 14 03/06/2023 2130   ALKPHOS 143 (H) 03/06/2023 2130   BILITOT 0.7 03/06/2023 2130   BILITOT 2.6 (H) 11/13/2020 1153   GFRNONAA >60 03/06/2023 2130   GFRAA 108 09/04/2020 0908    Lab Results  Component Value Date   HGBA1C 5.2 05/28/2022   Lab Results  Component Value Date   VITAMINB12 1,018 (H) 05/31/2022   Lab Results  Component Value Date   TSH 1.477 07/01/2022      ASSESSMENT AND PLAN 62 y.o. year old male  has a past medical history of Allergy, Anxiety, Arthritis, Ascites, Elevated transaminase level, Fatigue, Fatty liver, Hyperlipidemia, Hypertension, Hypothyroidism, Inguinal muscle strain, Insomnia, Left knee pain, Multinodular goiter, Obesity, Pneumonia, Psoriasis, and Tinnitus. here with :  Cognitive deficits  MMSE stable Continue to monitor   2. Seizures  Continue Keppra XR 1000 mg at bedime.   This will be increased once he is placed on tacrolimus by his surgeon to 1500 mg at bedtime  Follow-up in 6 months or sooner if needed   Butch Penny, MSN, NP-C 04/24/2023, 2:03 PM [facility] [address], [address] [place], [place] [address] [phone]

## 2023-04-25 ENCOUNTER — Encounter: Payer: Self-pay | Admitting: Adult Health

## 2023-04-25 ENCOUNTER — Ambulatory Visit: Payer: Medicare PPO | Admitting: Adult Health

## 2023-04-25 VITALS — BP 140/74 | HR 61 | Ht 66.6 in | Wt 164.0 lb

## 2023-04-25 DIAGNOSIS — G40209 Localization-related (focal) (partial) symptomatic epilepsy and epileptic syndromes with complex partial seizures, not intractable, without status epilepticus: Secondary | ICD-10-CM

## 2023-04-25 NOTE — Patient Instructions (Signed)
Your Plan:  Repeat EEG Continue Keppra for now      Thank you for coming to see [name] at [facility]. I hope we have been able to provide you high quality care today.  You may receive a patient satisfaction survey over the next few weeks. We would appreciate your feedback and comments so that we may continue to improve ourselves and the health of our patients.

## 2023-05-18 ENCOUNTER — Ambulatory Visit (INDEPENDENT_AMBULATORY_CARE_PROVIDER_SITE_OTHER): Payer: Medicare PPO | Admitting: Neurology

## 2023-05-18 DIAGNOSIS — G40209 Localization-related (focal) (partial) symptomatic epilepsy and epileptic syndromes with complex partial seizures, not intractable, without status epilepticus: Secondary | ICD-10-CM

## 2023-05-18 NOTE — Procedures (Signed)
    History:  62 year old man with seizure disorder  EEG classification: Awake and drowsy  Duration: 25 minutes   Technical aspects: This EEG study was done with scalp electrodes positioned according to the 10-20 International system of electrode placement. Electrical activity was reviewed with band pass filter of 1-70Hz , sensitivity of 7 uV/mm, display speed of 87mm/sec with a 60Hz  notched filter applied as appropriate. EEG data were recorded continuously and digitally stored.   Description of the recording: The background rhythms of this recording consists of a fairly well modulated medium amplitude alpha rhythm of 10 Hz that is reactive to eye opening and closure. Present in the anterior head region is a 15-20 Hz beta activity. Photic stimulation was performed, did not show any abnormalities. Hyperventilation was also performed, did not show any abnormalities. Drowsiness was manifested by background fragmentation. No abnormal epileptiform discharges seen during this recording. There was no focal slowing. There were no electrographic seizure identified.   Abnormality: None   Impression: This is a normal EEG recorded while drowsy and awake. No evidence of interictal epileptiform discharges. Normal EEGs, however, do not rule out epilepsy.    Windell Norfolk, MD [facility]

## 2023-05-19 ENCOUNTER — Encounter: Payer: Self-pay | Admitting: *Deleted

## 2023-05-19 NOTE — Telephone Encounter (Addendum)
Per Dr Lucia Gaskins, ok to wean off Keppra due to normal EEG. The patient is currently on 1500 mg (500 mg x 3) at bedtime. She states for patient to decrease dose by 500 mg every 2 weeks then stop.  I called the pt and let him know weaning schedule. We discussed that patient is concerned about not driving. He states he cannot afford to not drive for long. He states he was on 1000 mg before his surgery and only increased it due to being on Tacrolimus. Dr Lucia Gaskins says he can wait to stop driving until he reaches the 500 mg dose at bedtime (unless he has a seizure before then). Pt is ok with this but still concerned about length of time of no driving. He has to make other arrangements. Per Dr Lucia Gaskins, we can re-evaluate at 3 months and he can call us. He can schedule a follow-up visit in 6 months. Pt wants to wait to schedule a follow-up until he is in here next Monday with this wife. He was advised to call us with any concerns, seizure events, or difficulties weaning off keppra.

## 2023-05-19 NOTE — Telephone Encounter (Signed)
From last office note 04/25/23 :    05/18/23 EEG result:  Impression: This is a normal EEG recorded while drowsy and awake. No evidence of interictal epileptiform discharges. Normal EEGs, however, do not rule out epilepsy.      Windell Norfolk, MD [facility] /

## 2023-05-23 ENCOUNTER — Telehealth: Payer: Self-pay | Admitting: Neurology

## 2023-05-23 NOTE — Telephone Encounter (Signed)
Error- see 10/17 encounter.

## 2023-05-23 NOTE — Telephone Encounter (Signed)
Pt stopped by checkout today asking about scheduling an appointment. Pt wants to discuss weaning of Keppra sooner than I am able to get him scheduled for an appointment. He is asking if either Dr. Lucia Gaskins or Aundra Millet can call him to discuss. He states he is the only one in his household who drives and cannot be without driving for an extended period of time. Best call back # is [phone].

## 2023-05-24 NOTE — Telephone Encounter (Signed)
Sure

## 2023-05-25 ENCOUNTER — Ambulatory Visit: Payer: Medicare PPO | Admitting: Neurology

## 2023-05-26 NOTE — Telephone Encounter (Signed)
I contacted pt, informed him Dr Lucia Gaskins and Dr Teresa Coombs are advising that he first decrease this Keppra to 1000 mg and keep him driving for the next 3 months, then we will re-evaluate. Do not decrease any further. I advised him to call us back in 3 months, around Jan, to decide if any further changes need to be made. I scheduled him a 6 mo FU with Aundra Millet in March. Pt verbally understood and was appreciative.

## 2023-05-26 NOTE — Telephone Encounter (Signed)
When was his last seizure? I would first recommend to decrease this Keppra to 1000 mg and keep him driving for the next 3 months, then we will re-evaluate.

## 2023-06-27 ENCOUNTER — Other Ambulatory Visit (HOSPITAL_COMMUNITY): Payer: Self-pay

## 2023-07-19 ENCOUNTER — Ambulatory Visit: Payer: Medicare PPO | Admitting: Neurology

## 2023-08-24 ENCOUNTER — Telehealth: Payer: Self-pay | Admitting: Adult Health

## 2023-08-24 NOTE — Telephone Encounter (Signed)
Rescheduled appt on 10/10/23 at 8:30 am

## 2023-08-24 NOTE — Telephone Encounter (Signed)
LVM and sent mychart msg informing pt of need to reschedule 10/03/23 appt - NP schedule change

## 2023-10-03 ENCOUNTER — Ambulatory Visit: Payer: Medicare PPO | Admitting: Adult Health

## 2023-10-07 NOTE — Progress Notes (Addendum)
 PATIENT: Andrew Patton DOB: May 17, 1961  REASON FOR VISIT: follow up HISTORY FROM: patient PRIMARY NEUROLOGIST: Dr. Lucia Gaskins   Chief Complaint  Patient presents with   Follow-up    Patient in room #5 and alone. Patient states his been dealing with LIFE and his wife having brain tumor. Patient states he hasn't had a seizure for a while and he doing great with his Rx medication and diagnose.      HISTORY OF PRESENT ILLNESS: Today 10/10/23:   Andrew Patton is a 63 y.o. male with a history of seizures. Returns today for follow-up.  He reports that he was able to wean down Keppra XR 1000 mg daily.  No seizure events.  He has been tolerating the medication well.  He states that in November his wife was diagnosed with a meningioma of the brain.  She had surgery for removal at [facility].  He states that she has been doing well but she currently cannot drive.  He states that he does not wish to wean any further on the Keppra until his wife has made a full recovery.  04/25/23: Andrew Patton is a 63 y.o. male with a history of seizures. Returns today for follow-up.  Patient states that now that he has had his liver transplant and he is doing well he would like to start weaning off of Keppra if possible.  He also states his physicians at [facility] wants his PCP or other specialists to manage his medications.  Dr. Lucia Gaskins has refilled BuSpar and Lexapro for him in the past.  He states that he was also on amitriptyline which helped with discomfort.  However he states that [facility] will not refill this.  He is asked his PCP but they cannot see prescription strength.  He does feel that he needs to be on medication to help with his sleep and anxiety/depression.  He does have an appointment set up with psychiatry next week.  He returns today for an evaluation.  01/25/2023: Patient with seizures and recent liver transplant. Returns today for follow up. He is on tacrolimus and Keppra was increased to 1500XR. He is doing  great after the liver transport, then had emergency surgery for hernia. Eveythng is fine, no seizures, doing well on 1500mg  of keppra. Would keep him on medication one year and then consider decreasing or stopping, seizures were likely due to elevated ammonia and liver disease.   Patient complains of symptoms per HPI as well as the following symptoms: none . Pertinent negatives and positives per HPI. All others negative    09/16/22: Andrew Patton is a 63 y.o. male with a history of cognitive deficits and seizures. Returns today for follow-up. Continues to have short term memory issues.   In February had an episode where he blanked and lost track of where he was but as soon as his wife speaks to him and then he typically will get back to normal. Reports that last night he was a little off according to his wife but he had a long day d/t paracentesis.  His wife states that these episodes are not the same as his seizure events.  She feels that it is related to his liver and everything he has going on currently.  Patient will be having a liver transplant due to cirrhosis of the liver.  He will be placed on tacrolimus.  Dr. Lucia Gaskins spoke to his surgeon and Keppra XR will be increased to 1500 mg at bedtime when he  is started on this medication.   06/17/22: Andrew Patton is a 63 y.o. male who has been followed in this office for cognitive deficits and seizures. Returns today for follow-up. Wife feels that he has had mild "seizure events." She describes as fogginess (dazed look). Last for seconds- wife states that when she speaks to him he comes out of this. Wife feels that this is due to recent liver issues.   [name] and broke his nose 9/15. Last night he slipped in the tub and hit his head. Not on any blood thinners. Patient wife reports that she has not noticed any residual symptoms after he fell.  Recent infection was in hospital for 2 weeks. Had peritonitis. Now on Batrim.  Patient's wife feels that he is  slowly getting back to his baseline.  Still notices trouble with memory. Wife helps with ADLs due to physical limitations. Wife helps with appointments and medications. Not on any memory medication.  Wife feels that his cognition worsened after his hospitalization but is slowly getting better.  HISTORY Dec 10, 2021: Patient is here for follow-up after formal neurocognitive testing per Dr. Tamsen Snider.  I reviewed his notes as below.  Luckily there is no indication of Alzheimer's or Lewy body dementia or other neurodegenerative dementia.  However he does have significant cognitive deficits.  We discussed conservative measures and most of all managing his medical conditions which she is doing very well.  I answered all the questions as to the best of my ability.  He was here with his wife who also provided much information.  Discussed returning to primary care, patient and wife would like to continue following with [name], we will continue to follow him every 6 months for the next year to ensure his stability.   Dr. Kieth Brightly: Impression/Diagnosis:                     The results of the current neuropsychological evaluation are consistent between the patient's reports and his wife reports about significant cognitive difficulties.  However, the obtained scores were significantly below and worse than the descriptions provided by the patient and his wife.  The patient was so severely impaired during the objective assessment that it would have been very difficult for him to manage even basic ADL type functions around his house.  I do think that it is difficult to assess effectively areas outside of those primarily related to attentional deficits particularly for auditory and visual encoding and focus execute capacity.  These are so impaired that they had a significant impact on all other cognitive areas assessed.  The patient does clearly have significant cognitive deficits with significant memory and attentional  deficits present.  This pattern would be consistent with postconcussion syndrome types of deficits but they are will be on what would be expected typically from a concussive event and isolation.  As far as diagnostic considerations, the patient presents with a very complicated medical and neuropsychological picture.  The patient has had multiple concussive events going back over 3 decades.  However, the most changes were noted after a significant fall in September 2021.  The patient began having seizure-like events that were consistent with complex partial type seizures.  He has responded very well to Keppra as far as reducing these events.  He would have acute confusion, disorientation and balance issues during these times.  While neuro imaging including MRIs did not show any observable abnormalities I suspect that the patient has had some degree  of diffuse axonal injury as his cognitive deficits go well beyond those typically seen with lobar white matter injury slowly.  I do, however, think that there are other issues going on that are contributing and worsening his symptoms although some of which we are not going to be able to avoid.  The patient has responded very well to Keppra and this is decreased his acute seizure/confusional events.  However, due to his anxiety and depressive symptomatology and sleep difficulties he has continued to take benzo diazepam such as Ambien, clonazepam as well as Keppra for his seizures.  The patient has longstanding sleep disturbance but was not found to have sleep apnea.  During one of his acute events at the winery he had an elevated blood glucose level of 242 which is significant although I suspect that his baseline is relatively high to begin with.  Thyroid issues, chronic insomnia, treatment of his anxiety with benzo diazepam, seizures and the focal injury or location contributing to the seizure events are all likely producing the primary complex of symptoms resulting in  his subjective experiences.  The primary area of cognitive deficits around attention and focus execute abilities would suggest primary frontal lobe involvement and his deficits in the degree of other areas including temporal lobe functioning are very difficult to ascertain as his attentional deficits are so significant profound that almost all other neuropsychological measures are going to be disproportionately and significantly impacted due to his difficulty keeping instructions and directions straight during various tasks.  As far as treatment recommendations it will be important for the patient to continue to work on his depression and anxiety.  He should also continue close monitoring on metabolic issues including his diabetes and thyroid issues and overall liver function etc.  Whenever possible I would suggest he limit his benzo diazepam use if possible as they may exacerbate some of his memory and attentional issues.  While clearly his Keppra use is not the cause of his overall cognitive dysfunction and is essential for managing his seizure activity it may also be playing some role in his cognitive issues but his attentional and memory deficits are so significant that they could not be explained by typical side effects from Keppra.   I will sit down with the patient and go over all the results of the current neuropsychological evaluation.  We will address issues related to how to better compensate for his significant attentional issues and encoding deficits and explain how these areas of cognitive functioning are good impact other life details.  Good dietary habits, working on improving his sleep anyway we can and maintaining sustained physical activity will be important overall.  REVIEW OF SYSTEMS: Out of a complete 14 system review of symptoms, the patient complains only of the following symptoms, and all other reviewed systems are negative.  ALLERGIES: Allergies  Allergen Reactions   Fentanyl  Shortness Of Breath    Was given larger doses   Meloxicam Other (See Comments)    Leg pain   Rosuvastatin Other (See Comments)    Elevated liver enzymes   Wound Dressing Adhesive     Tape causing abrasions on his skin    Ciprofloxacin Rash   Penicillins Rash    Has patient had a PCN reaction causing immediate rash, facial/tongue/throat swelling, SOB or lightheadedness with hypotension: No Has patient had a PCN reaction causing severe rash involving mucus membranes or skin necrosis: No Has patient had a PCN reaction that required hospitalization: No Has patient had a  PCN reaction occurring within the last 10 years: No If all of the above answers are "NO", then may proceed with Cephalosporin use. Other reaction(s): rash    HOME MEDICATIONS: Outpatient Medications Prior to Visit  Medication Sig Dispense Refill   acetaminophen (TYLENOL) 325 MG tablet Take 650 mg by mouth every 8 (eight) hours.     amLODipine (NORVASC) 5 MG tablet Take 5 mg by mouth daily as needed.     aspirin EC 81 MG tablet Take 81 mg by mouth daily.     busPIRone (BUSPAR) 15 MG tablet Take 1/2 tablet (7.5 mg) in the morning and Take 1 tablet (15 mg) at supper 135 tablet 4   Calcium Citrate-Vitamin D 315-5 MG-MCG TABS Take 2 tablets by mouth in the morning and at bedtime.     clobetasol (TEMOVATE) 0.05 % external solution Apply 1 Application topically 2 (two) times daily as needed (for psoriasis).     escitalopram (LEXAPRO) 5 MG tablet Take 1 tablet (5 mg total) by mouth daily. 90 tablet 4   ezetimibe (ZETIA) 10 MG tablet Take 10 mg by mouth daily.     gabapentin (NEURONTIN) 300 MG capsule Take 600 mg by mouth 3 (three) times daily.     hydrocortisone (CORTEF) 5 MG tablet Take 5-10 mg by mouth See admin instructions. Take 2 tablets by mouth in the morning and 1 tablet in the evening. Double your dose as needed for sick days.     insulin regular (NOVOLIN R) 100 units/mL injection Inject 2 Units into the skin 3 (three)  times daily with meals. Per sliding scale     lactulose (CHRONULAC) 10 GM/15ML solution Take 15 mLs (10 g total) by mouth 2 (two) times daily as needed for mild constipation (for bowel movments 2-3 per day). (Patient taking differently: Take 10 g by mouth 2 (two) times daily as needed for mild constipation (for bowel movments 2-3 per day). Pt gives usually between 7- 10 mls as needed)     levETIRAcetam (KEPPRA XR) 500 MG 24 hr tablet Take 3 tablets (1,500 mg total) by mouth at bedtime. 270 tablet 4   levothyroxine (SYNTHROID) 88 MCG tablet Take 88 mcg by mouth every morning.     loperamide (IMODIUM) 2 MG capsule Take 2 mg by mouth 3 (three) times daily as needed for diarrhea or loose stools.     melatonin 3 MG TABS tablet Take 3 mg by mouth at bedtime.     morphine 10 MG/5ML solution Take by mouth every 12 (twelve) hours as needed for severe pain. 7.5 mL     mycophenolate (CELLCEPT) 250 MG capsule Take 500 mg by mouth 2 (two) times daily.     naloxone (NARCAN) nasal spray 4 mg/0.1 mL Place into the nose.     ondansetron (ZOFRAN ODT) 4 MG disintegrating tablet Take 1 tablet (4 mg total) by mouth every 8 (eight) hours as needed for nausea or vomiting. (Patient taking differently: Take 4 mg by mouth every 8 (eight) hours as needed for nausea or vomiting (dissolve orally).) 6 tablet 0   pantoprazole (PROTONIX) 40 MG tablet Take 40 mg by mouth daily.     polyethylene glycol powder (GLYCOLAX/MIRALAX) 17 GM/SCOOP powder Take 17 g by mouth daily as needed for mild constipation.     SOLU-CORTEF 100 MG injection Inject 100 mg into the muscle once as needed (for temp >104).     tacrolimus (PROGRAF) 1 MG capsule Take 2-3 mg by mouth See admin instructions. Take  2 capsules by mouth in the morning and 3 capsules at night     tamsulosin (FLOMAX) 0.4 MG CAPS capsule Take 0.4 mg by mouth at bedtime.     ursodiol (ACTIGALL) 300 MG capsule Take 300 mg by mouth 2 (two) times daily.     valGANciclovir (VALCYTE) 450 MG  tablet Take 900 mg by mouth daily.     No facility-administered medications prior to visit.    PAST MEDICAL HISTORY: Past Medical History:  Diagnosis Date   Allergy    Anxiety    Arthritis    Ascites    Elevated transaminase level    Fatigue    Fatty liver    Hyperlipidemia    Hypertension    "under control", not on any blood pressure medication, has lost 60 lbs   Hypothyroidism    on synthroid   Inguinal muscle strain    Insomnia    Left knee pain    "long time ago"   Multinodular goiter    Obesity    Pneumonia    Psoriasis    Tinnitus     PAST SURGICAL HISTORY: Past Surgical History:  Procedure Laterality Date   APPENDECTOMY  1976   FOOT SURGERY Bilateral 1975   IR PARACENTESIS  11/25/2021   IR PARACENTESIS  12/02/2021   IR PARACENTESIS  12/17/2021   IR PARACENTESIS  01/05/2022   IR PARACENTESIS  02/04/2022   IR PARACENTESIS  02/15/2022   IR PARACENTESIS  03/03/2022   IR PARACENTESIS  03/10/2022   IR PARACENTESIS  03/16/2022   IR PARACENTESIS  03/23/2022   IR PARACENTESIS  03/29/2022   IR PARACENTESIS  04/06/2022   IR PARACENTESIS  04/20/2022   IR PARACENTESIS  05/04/2022   IR PARACENTESIS  05/20/2022   IR PARACENTESIS  05/31/2022   IR PARACENTESIS  06/11/2022   IR PARACENTESIS  06/18/2022   IR PARACENTESIS  06/25/2022   IR PARACENTESIS  07/02/2022   IR PARACENTESIS  07/09/2022   IR PARACENTESIS  07/20/2022   IR PARACENTESIS  07/27/2022   IR PARACENTESIS  08/03/2022   IR PARACENTESIS  08/09/2022   IR PARACENTESIS  08/13/2022   IR PARACENTESIS  08/17/2022   IR PARACENTESIS  08/20/2022   IR PARACENTESIS  08/23/2022   IR PARACENTESIS  08/26/2022   IR PARACENTESIS  08/30/2022   IR PARACENTESIS  09/02/2022   IR PARACENTESIS  09/07/2022   IR PARACENTESIS  09/10/2022   IR PARACENTESIS  09/15/2022   IR PARACENTESIS  09/20/2022   IR PARACENTESIS  09/23/2022   IR PARACENTESIS  09/28/2022   IR RADIOLOGIST EVAL & MGMT  12/17/2021   IR RADIOLOGIST EVAL & MGMT  12/31/2021   NOSE SURGERY      ROTATOR CUFF REPAIR Left    left   SHOULDER SURGERY Right 12/20/2019   [facility]     FAMILY HISTORY: Family History  Problem Relation Age of Onset   Breast cancer Mother    Hyperlipidemia Mother    Other Mother        ? form of parkinson's    Parkinsonism Mother    Bladder Cancer Father    Parkinson's disease Father    Memory loss Maternal Grandmother        in her 90s   Birth defects Maternal Grandfather    Birth defects Paternal Grandmother    Colon cancer Neg Hx    Esophageal cancer Neg Hx    Rectal cancer Neg Hx    Stomach cancer Neg  Hx    Colon polyps Neg Hx    Dementia Neg Hx    Alzheimer's disease Neg Hx     SOCIAL HISTORY: Social History   Socioeconomic History   Marital status: Married    Spouse name: Not on file   Number of children: 2   Years of education: Not on file   Highest education level: Associate degree: academic program  Occupational History   Occupation: sales  Tobacco Use   Smoking status: Former    Current packs/day: 0.00    Types: Cigarettes    Quit date: 03/27/1994    Years since quitting: 29.5   Smokeless tobacco: Former    Types: Chew   Tobacco comments:    did chew as a teenager  Vaping Use   Vaping status: Never Used  Substance and Sexual Activity   Alcohol use: Not Currently    Alcohol/week: 2.0 standard drinks of alcohol    Types: 2 Glasses of wine per week   Drug use: No   Sexual activity: Not on file  Other Topics Concern   Not on file  Social History Narrative   Lives at home with wife    Right handed   Caffeine: maybe 1-3 cups/week   Social Drivers of Health   Financial Resource Strain: High Risk (03/25/2023)   Received from [facility]   Overall Financial Resource Strain (CARDIA)    Difficulty of Paying Living Expenses: Very hard  Food Insecurity: No Food Insecurity (03/25/2023)   Received from [facility]   Hunger Vital Sign    Ran Out of Food in the Last  Year: Never true    Worried About Running Out of Food in the Last Year: Never true  Transportation Needs: No Transportation Needs (03/25/2023)   Received from [facility]   PRAPARE - Transportation    Lack of Transportation (Non-Medical): No    In the past 12 months, has lack of transportation kept you from medical appointments or from getting medications?: No  Physical Activity: Not on file  Stress: Not on file  Social Connections: Unknown (12/15/2021)   Received from [facility]   Social Network    Social Network: Not on file  Intimate Partner Violence: Not At Risk (06/01/2022)   Humiliation, Afraid, Rape, and Kick questionnaire    Fear of Current or Ex-Partner: No    Emotionally Abused: No    Physically Abused: No    Sexually Abused: No      PHYSICAL EXAM  Vitals:   10/10/23 0802  BP: (!) 145/76  Pulse: 65  Weight: 195 lb (88.5 kg)  Height: 5\' 6"  (1.676 m)     Body mass index is 31.47 kg/m.     04/25/2023    9:21 AM 09/16/2022    8:47 AM 06/17/2022    9:04 AM  MMSE - Mini Mental State Exam  Orientation to time 5 5 3   Orientation to Place 5 5 4   Registration 3 3 3   Attention/ Calculation 4 1 2   Recall 3 3 3   Language- name 2 objects 2 2 2   Language- repeat 1 1 1   Language- follow 3 step command 3 3 3   Language- read & follow direction 1 1 1   Write a sentence 1 0 1  Copy design 1 0 0  Total score 29 24 23      Generalized: Well developed, in no acute distress   Neurological examination  Mentation: Alert oriented  to time, place, history taking. Follows all commands speech and language fluent Cranial nerve II-XII: Pupils were equal round reactive to light. Extraocular movements were full, visual field were full on confrontational test. Facial sensation and strength were normal.. Head turning and shoulder shrug  were normal and symmetric. Motor: The motor testing reveals 5 over 5 strength of all 4 extremities. Good symmetric  motor tone is noted throughout.  Sensory: Sensory testing is intact to soft touch on all 4 extremities. No evidence of extinction is noted.  Coordination: Cerebellar testing reveals good finger-nose-finger and heel-to-shin bilaterally.  Gait and station: Gait is normal.   DIAGNOSTIC DATA (LABS, IMAGING, TESTING) - I reviewed patient records, labs, notes, testing and imaging myself where available.  Lab Results  Component Value Date   WBC 3.3 (L) 03/06/2023   HGB 8.8 (L) 03/06/2023   HCT 29.1 (L) 03/06/2023   MCV 88.2 03/06/2023   PLT 352 03/06/2023      Component Value Date/Time   NA 137 03/06/2023 2130   NA 138 11/13/2020 1153   K 4.6 03/06/2023 2130   CL 102 03/06/2023 2130   CO2 23 03/06/2023 2130   GLUCOSE 99 03/06/2023 2130   BUN 18 03/06/2023 2130   BUN 6 11/13/2020 1153   CREATININE 1.13 03/06/2023 2130   CALCIUM 8.5 (L) 03/06/2023 2130   PROT 6.2 (L) 03/06/2023 2130   PROT 7.2 11/13/2020 1153   ALBUMIN 3.2 (L) 03/06/2023 2130   ALBUMIN 4.4 11/13/2020 1153   AST 17 03/06/2023 2130   ALT 14 03/06/2023 2130   ALKPHOS 143 (H) 03/06/2023 2130   BILITOT 0.7 03/06/2023 2130   BILITOT 2.6 (H) 11/13/2020 1153   GFRNONAA >60 03/06/2023 2130   GFRAA 108 09/04/2020 0908    Lab Results  Component Value Date   HGBA1C 5.2 05/28/2022   Lab Results  Component Value Date   VITAMINB12 1,018 (H) 05/31/2022   Lab Results  Component Value Date   TSH 1.477 07/01/2022      ASSESSMENT AND PLAN 63 y.o. year old male  has a past medical history of Allergy, Anxiety, Arthritis, Ascites, Elevated transaminase level, Fatigue, Fatty liver, Hyperlipidemia, Hypertension, Hypothyroidism, Inguinal muscle strain, Insomnia, Left knee pain, Multinodular goiter, Obesity, Pneumonia, Psoriasis, and Tinnitus. here with :   1. Seizures  Continue Keppra XR 1000 mg at bedtime For now we will not continue weaning off of Keppra.  The patient has to be able to drive as his wife is recovering  from brain surgery.  We will consider reducing Keppra at the next visit. Follow-up in 6-7 months or sooner if needed   Butch Penny, MSN, NP-C 10/07/2023, 3:54 PM [facility] [address], [address] [place], [place] [address] [phone]

## 2023-10-10 ENCOUNTER — Ambulatory Visit (INDEPENDENT_AMBULATORY_CARE_PROVIDER_SITE_OTHER): Payer: Medicare PPO | Admitting: Adult Health

## 2023-10-10 VITALS — BP 145/76 | HR 65 | Ht 66.0 in | Wt 195.0 lb

## 2023-10-10 DIAGNOSIS — G40209 Localization-related (focal) (partial) symptomatic epilepsy and epileptic syndromes with complex partial seizures, not intractable, without status epilepticus: Secondary | ICD-10-CM | POA: Diagnosis not present

## 2023-10-10 MED ORDER — LEVETIRACETAM ER 500 MG PO TB24: 1000.0000 mg | ORAL_TABLET | Freq: Every day | ORAL | 3 refills | Status: DC

## 2023-10-10 NOTE — Patient Instructions (Addendum)
Your Plan:  Continue Keppra XR 1000 mg daily If your symptoms worsen or you develop new symptoms please let us know.    Thank you for coming to see [name] at [facility]. I hope we have been able to provide you high quality care today.  You may receive a patient satisfaction survey over the next few weeks. We would appreciate your feedback and comments so that we may continue to improve ourselves and the health of our patients.

## 2023-10-18 ENCOUNTER — Encounter: Payer: Self-pay | Admitting: Gastroenterology

## 2023-10-26 ENCOUNTER — Ambulatory Visit (AMBULATORY_SURGERY_CENTER)

## 2023-10-26 VITALS — Ht 66.5 in | Wt 190.0 lb

## 2023-10-26 DIAGNOSIS — Z1211 Encounter for screening for malignant neoplasm of colon: Secondary | ICD-10-CM

## 2023-10-26 MED ORDER — NA SULFATE-K SULFATE-MG SULF 17.5-3.13-1.6 GM/177ML PO SOLN: 1.0000 | Freq: Once | ORAL | 0 refills | Status: AC

## 2023-10-26 NOTE — Progress Notes (Signed)
 No egg or soy allergy known to patient  No issues known to pt with past sedation with any surgeries or procedures Patient denies ever being told they had issues or difficulty with intubation  No FH of Malignant Hyperthermia Pt is not on diet pills Pt is not on  home 02  Pt is not on blood thinners  Pt denies issues with constipation  No A fib or A flutter Have any cardiac testing pending--no  LOA: independent  Prep: suprep   Patient's chart reviewed by Cathlyn Parsons CNRA prior to previsit and patient appropriate for the [facility].  Previsit completed and red dot placed by patient's name on their procedure day (on provider's schedule).     PV completed with patient. Prep instructions sent via mychart

## 2023-10-31 ENCOUNTER — Encounter: Payer: Self-pay | Admitting: Gastroenterology

## 2023-11-03 ENCOUNTER — Encounter: Payer: Self-pay | Admitting: Gastroenterology

## 2023-11-03 ENCOUNTER — Ambulatory Visit (AMBULATORY_SURGERY_CENTER): Admitting: Gastroenterology

## 2023-11-03 VITALS — BP 158/74 | HR 62 | Temp 98.8°F | Resp 14 | Ht 66.5 in | Wt 192.0 lb

## 2023-11-03 DIAGNOSIS — Z1211 Encounter for screening for malignant neoplasm of colon: Secondary | ICD-10-CM

## 2023-11-03 DIAGNOSIS — K5730 Diverticulosis of large intestine without perforation or abscess without bleeding: Secondary | ICD-10-CM

## 2023-11-03 DIAGNOSIS — K644 Residual hemorrhoidal skin tags: Secondary | ICD-10-CM

## 2023-11-03 DIAGNOSIS — D125 Benign neoplasm of sigmoid colon: Secondary | ICD-10-CM

## 2023-11-03 MED ORDER — SODIUM CHLORIDE 0.9 % IV SOLN: 500.0000 mL | Freq: Once | INTRAVENOUS | Status: DC

## 2023-11-03 NOTE — Progress Notes (Signed)
 GASTROENTEROLOGY PROCEDURE H&P NOTE   Primary Care Physician: [name], MD    Reason for Procedure:  Colon Cancer screening  Plan:    Colonoscopy  Patient is appropriate for endoscopic procedure(s) in the ambulatory ([facility]) setting.  The nature of the procedure, as well as the risks, benefits, and alternatives were carefully and thoroughly reviewed with the patient. Ample time for discussion and questions allowed. The patient understood, was satisfied, and agreed to proceed.     HPI: Andrew Patton is a 63 y.o. male who presents for colonoscopy for ongoing Colon Cancer screening.  No active GI symptoms.   Last colonoscopy was 01/2013 and normal.  History of cirrhosis, now s/p liver transplant at [facility].    Past Medical History:  Diagnosis Date   Allergy    Anxiety    Arthritis    Ascites    Elevated transaminase level    Fatigue    Fatty liver    Hyperlipidemia    Hypertension    "under control", not on any blood pressure medication, has lost 60 lbs   Hypothyroidism    on synthroid   Inguinal muscle strain    Insomnia    Left knee pain    "long time ago"   Multinodular goiter    Obesity    Pneumonia    Psoriasis    Tinnitus     Past Surgical History:  Procedure Laterality Date   APPENDECTOMY  1976   FOOT SURGERY Bilateral 1975   IR PARACENTESIS  11/25/2021   IR PARACENTESIS  12/02/2021   IR PARACENTESIS  12/17/2021   IR PARACENTESIS  01/05/2022   IR PARACENTESIS  02/04/2022   IR PARACENTESIS  02/15/2022   IR PARACENTESIS  03/03/2022   IR PARACENTESIS  03/10/2022   IR PARACENTESIS  03/16/2022   IR PARACENTESIS  03/23/2022   IR PARACENTESIS  03/29/2022   IR PARACENTESIS  04/06/2022   IR PARACENTESIS  04/20/2022   IR PARACENTESIS  05/04/2022   IR PARACENTESIS  05/20/2022   IR PARACENTESIS  05/31/2022   IR PARACENTESIS  06/11/2022   IR PARACENTESIS  06/18/2022   IR PARACENTESIS  06/25/2022   IR PARACENTESIS  07/02/2022   IR PARACENTESIS  07/09/2022   IR  PARACENTESIS  07/20/2022   IR PARACENTESIS  07/27/2022   IR PARACENTESIS  08/03/2022   IR PARACENTESIS  08/09/2022   IR PARACENTESIS  08/13/2022   IR PARACENTESIS  08/17/2022   IR PARACENTESIS  08/20/2022   IR PARACENTESIS  08/23/2022   IR PARACENTESIS  08/26/2022   IR PARACENTESIS  08/30/2022   IR PARACENTESIS  09/02/2022   IR PARACENTESIS  09/07/2022   IR PARACENTESIS  09/10/2022   IR PARACENTESIS  09/15/2022   IR PARACENTESIS  09/20/2022   IR PARACENTESIS  09/23/2022   IR PARACENTESIS  09/28/2022   IR RADIOLOGIST EVAL & MGMT  12/17/2021   IR RADIOLOGIST EVAL & MGMT  12/31/2021   NOSE SURGERY     ROTATOR CUFF REPAIR Left    left   SHOULDER SURGERY Right 12/20/2019   [facility]     Prior to Admission medications   Medication Sig Start Date End Date Taking? Authorizing Provider  acetaminophen (TYLENOL) 325 MG tablet Take 650 mg by mouth every 8 (eight) hours. 12/16/22   [provider]  amLODipine (NORVASC) 5 MG tablet Take 5 mg by mouth daily as needed. Patient not taking: Reported on 10/26/2023 03/01/23 02/29/24  [provider]  aspirin EC  81 MG tablet Take 81 mg by mouth daily. Patient not taking: Reported on 10/26/2023    [provider]  busPIRone (BUSPAR) 15 MG tablet Take 1/2 tablet (7.5 mg) in the morning and Take 1 tablet (15 mg) at supper Patient taking differently: Take 15 mg by mouth 2 (two) times daily. 01/25/23   Anson Fret, MD  Calcium Citrate-Vitamin D 315-5 MG-MCG TABS Take 2 tablets by mouth in the morning and at bedtime. 02/08/23   [provider]  clobetasol (TEMOVATE) 0.05 % external solution Apply 1 Application topically 2 (two) times daily as needed (for psoriasis). 02/28/23 02/28/24  [provider]  escitalopram (LEXAPRO) 5 MG tablet Take 1 tablet (5 mg total) by mouth daily. Patient not taking: Reported on 10/26/2023 01/25/23   Anson Fret, MD  ezetimibe (ZETIA) 10 MG tablet Take 10 mg by mouth daily. Patient not  taking: Reported on 10/26/2023    [provider]  folic acid (FOLVITE) 1 MG tablet Take 1 mg by mouth daily.    [provider]  gabapentin (NEURONTIN) 300 MG capsule Take 300 mg by mouth 3 (three) times daily. 02/08/23   [provider]  hydrocortisone (CORTEF) 5 MG tablet Take 5-10 mg by mouth See admin instructions. Take 2 tablets by mouth in the morning and 1 tablet in the evening. Double your dose as needed for sick days. 02/18/23 02/18/24  [provider]  insulin regular (NOVOLIN R) 100 units/mL injection Inject 2 Units into the skin 3 (three) times daily with meals. Per sliding scale Patient not taking: Reported on 10/26/2023 11/12/22 05/14/23  [provider]  JANUVIA 50 MG tablet Take 1 tablet by mouth daily. 06/22/23   [provider]  ketoconazole (NIZORAL) 2 % shampoo Apply 1 Application topically as needed.    [provider]  lactulose (CHRONULAC) 10 GM/15ML solution Take 15 mLs (10 g total) by mouth 2 (two) times daily as needed for mild constipation (for bowel movments 2-3 per day). Patient not taking: Reported on 10/26/2023 07/06/21   Joycelyn Das, MD  levETIRAcetam (KEPPRA XR) 500 MG 24 hr tablet Take 2 tablets (1,000 mg total) by mouth at bedtime. 10/10/23   Butch Penny, NP  levothyroxine (SYNTHROID) 88 MCG tablet Take 88 mcg by mouth every morning. 02/14/22   [provider]  loperamide (IMODIUM) 2 MG capsule Take 2 mg by mouth 3 (three) times daily as needed for diarrhea or loose stools. Patient not taking: Reported on 10/26/2023 02/28/23   [provider]  loratadine (CLARITIN) 10 MG tablet Take 1 tablet by mouth daily. 05/11/23   [provider]  melatonin 3 MG TABS tablet Take 3 mg by mouth at bedtime. Patient not taking: Reported on 10/26/2023    [provider]  morphine 10 MG/5ML solution Take by mouth every 12 (twelve) hours as needed for severe pain. 7.5 mL Patient not taking:  Reported on 10/26/2023 01/20/23   [provider]  mycophenolate (CELLCEPT) 250 MG capsule Take 250 mg by mouth 2 (two) times daily. 12/24/22   [provider]  naloxone [facility]) nasal spray 4 mg/0.1 mL Place into the nose. 07/14/22   [provider]  nortriptyline (PAMELOR) 50 MG capsule Take 50 mg by mouth at bedtime. 05/27/23   [provider]  ondansetron (ZOFRAN ODT) 4 MG disintegrating tablet Take 1 tablet (4 mg total) by mouth every 8 (eight) hours as needed for nausea or vomiting. Patient taking differently: Take 4 mg  by mouth every 8 (eight) hours as needed for nausea or vomiting (dissolve orally). 02/08/20   Maxwell Caul, PA-C  pantoprazole (PROTONIX) 40 MG tablet Take 40 mg by mouth daily. 11/23/22 10/26/23  [provider]  polyethylene glycol powder (GLYCOLAX/MIRALAX) 17 GM/SCOOP powder Take 17 g by mouth daily as needed for mild constipation. Patient not taking: Reported on 10/26/2023    [provider]  rosuvastatin (CRESTOR) 10 MG tablet Take 10 mg by mouth daily. 09/29/23   [provider]  sildenafil (VIAGRA) 100 MG tablet Take 100 mg by mouth daily as needed. 10/11/23   [provider]  SOLU-CORTEF 100 MG injection Inject 100 mg into the muscle once as needed (for temp >104). 02/18/23   [provider]  Specialty Vitamins Products (MG PLUS PROTEIN) 133 MG TABS Take 2 tablets by mouth in the morning and at bedtime. 09/27/23 12/26/23  [provider]  tacrolimus (PROGRAF) 1 MG capsule Take 1 mg by mouth 2 (two) times daily. 01/17/23 01/17/24  [provider]  tamsulosin (FLOMAX) 0.4 MG CAPS capsule Take 0.4 mg by mouth 2 (two) times daily. 12/31/22 12/31/23  [provider]  ursodiol (ACTIGALL) 300 MG capsule Take 300 mg by mouth 2 (two) times daily.    [provider]  valGANciclovir (VALCYTE) 450 MG tablet Take 900 mg by mouth daily. Patient not taking: Reported on 10/26/2023     [provider]    Current Outpatient Medications  Medication Sig Dispense Refill   acetaminophen (TYLENOL) 325 MG tablet Take 650 mg by mouth every 8 (eight) hours.     amLODipine (NORVASC) 5 MG tablet Take 5 mg by mouth daily as needed. (Patient not taking: Reported on 10/26/2023)     aspirin EC 81 MG tablet Take 81 mg by mouth daily. (Patient not taking: Reported on 10/26/2023)     busPIRone (BUSPAR) 15 MG tablet Take 1/2 tablet (7.5 mg) in the morning and Take 1 tablet (15 mg) at supper (Patient taking differently: Take 15 mg by mouth 2 (two) times daily.) 135 tablet 4   Calcium Citrate-Vitamin D 315-5 MG-MCG TABS Take 2 tablets by mouth in the morning and at bedtime.     clobetasol (TEMOVATE) 0.05 % external solution Apply 1 Application topically 2 (two) times daily as needed (for psoriasis).     escitalopram (LEXAPRO) 5 MG tablet Take 1 tablet (5 mg total) by mouth daily. (Patient not taking: Reported on 10/26/2023) 90 tablet 4   ezetimibe (ZETIA) 10 MG tablet Take 10 mg by mouth daily. (Patient not taking: Reported on 10/26/2023)     folic acid (FOLVITE) 1 MG tablet Take 1 mg by mouth daily.     gabapentin (NEURONTIN) 300 MG capsule Take 300 mg by mouth 3 (three) times daily.     hydrocortisone (CORTEF) 5 MG tablet Take 5-10 mg by mouth See admin instructions. Take 2 tablets by mouth in the morning and 1 tablet in the evening. Double your dose as needed for sick days.     insulin regular (NOVOLIN R) 100 units/mL injection Inject 2 Units into the skin 3 (three) times daily with meals. Per sliding scale (Patient not taking: Reported on 10/26/2023)     JANUVIA 50 MG tablet Take 1 tablet by mouth daily.     ketoconazole (NIZORAL) 2 % shampoo Apply 1 Application topically as needed.     lactulose (CHRONULAC) 10 GM/15ML solution Take 15 mLs (10 g total) by mouth 2 (two) times daily  as needed for mild constipation (for bowel movments 2-3 per day). (Patient not taking: Reported on 10/26/2023)      levETIRAcetam (KEPPRA XR) 500 MG 24 hr tablet Take 2 tablets (1,000 mg total) by mouth at bedtime. 180 tablet 3   levothyroxine (SYNTHROID) 88 MCG tablet Take 88 mcg by mouth every morning.     loperamide (IMODIUM) 2 MG capsule Take 2 mg by mouth 3 (three) times daily as needed for diarrhea or loose stools. (Patient not taking: Reported on 10/26/2023)     loratadine (CLARITIN) 10 MG tablet Take 1 tablet by mouth daily.     melatonin 3 MG TABS tablet Take 3 mg by mouth at bedtime. (Patient not taking: Reported on 10/26/2023)     morphine 10 MG/5ML solution Take by mouth every 12 (twelve) hours as needed for severe pain. 7.5 mL (Patient not taking: Reported on 10/26/2023)     mycophenolate (CELLCEPT) 250 MG capsule Take 250 mg by mouth 2 (two) times daily.     naloxone (NARCAN) nasal spray 4 mg/0.1 mL Place into the nose.     nortriptyline (PAMELOR) 50 MG capsule Take 50 mg by mouth at bedtime.     ondansetron (ZOFRAN ODT) 4 MG disintegrating tablet Take 1 tablet (4 mg total) by mouth every 8 (eight) hours as needed for nausea or vomiting. (Patient taking differently: Take 4 mg by mouth every 8 (eight) hours as needed for nausea or vomiting (dissolve orally).) 6 tablet 0   pantoprazole (PROTONIX) 40 MG tablet Take 40 mg by mouth daily.     polyethylene glycol powder (GLYCOLAX/MIRALAX) 17 GM/SCOOP powder Take 17 g by mouth daily as needed for mild constipation. (Patient not taking: Reported on 10/26/2023)     rosuvastatin (CRESTOR) 10 MG tablet Take 10 mg by mouth daily.     sildenafil (VIAGRA) 100 MG tablet Take 100 mg by mouth daily as needed.     SOLU-CORTEF 100 MG injection Inject 100 mg into the muscle once as needed (for temp >104).     Specialty Vitamins Products (MG PLUS PROTEIN) 133 MG TABS Take 2 tablets by mouth in the morning and at bedtime.     tacrolimus (PROGRAF) 1 MG capsule Take 1 mg by mouth 2 (two) times daily.     tamsulosin (FLOMAX) 0.4 MG CAPS capsule Take 0.4 mg by mouth 2 (two)  times daily.     ursodiol (ACTIGALL) 300 MG capsule Take 300 mg by mouth 2 (two) times daily.     valGANciclovir (VALCYTE) 450 MG tablet Take 900 mg by mouth daily. (Patient not taking: Reported on 10/26/2023)     No current facility-administered medications for this visit.    Allergies as of 11/03/2023 - Review Complete 11/03/2023  Allergen Reaction Noted   Fentanyl Shortness Of Breath and Swelling 06/18/2022   Rosuvastatin Other (See Comments) 07/02/2021   Penicillins Rash 01/24/2013   Ciprofloxacin Rash 06/17/2022   Meloxicam Other (See Comments) 03/08/2019   Wound dressing adhesive Other (See Comments) 07/01/2022    Family History  Problem Relation Age of Onset   Breast cancer Mother    Hyperlipidemia Mother    Other Mother        ? form of parkinson's    Parkinsonism Mother    Bladder Cancer Father    Parkinson's disease Father    Memory loss Maternal Grandmother        in her 90s   Birth defects Maternal Grandfather    Birth defects Paternal Grandmother  Colon cancer Neg Hx    Esophageal cancer Neg Hx    Rectal cancer Neg Hx    Stomach cancer Neg Hx    Colon polyps Neg Hx    Dementia Neg Hx    Alzheimer's disease Neg Hx     Social History   Socioeconomic History   Marital status: Married    Spouse name: Not on file   Number of children: 2   Years of education: Not on file   Highest education level: Associate degree: academic program  Occupational History   Occupation: sales  Tobacco Use   Smoking status: Former    Current packs/day: 0.00    Types: Cigarettes    Quit date: 03/27/1994    Years since quitting: 29.6   Smokeless tobacco: Former    Types: Chew   Tobacco comments:    did chew as a teenager  Vaping Use   Vaping status: Never Used  Substance and Sexual Activity   Alcohol use: Not Currently    Alcohol/week: 2.0 standard drinks of alcohol    Types: 2 Glasses of wine per week   Drug use: No   Sexual activity: Not on file  Other Topics  Concern   Not on file  Social History Narrative   Lives at home with wife    Right handed   Caffeine: maybe 1-3 cups/week   Social Drivers of Health   Financial Resource Strain: High Risk (03/25/2023)   Received from [facility]   Overall Financial Resource Strain (CARDIA)    Difficulty of Paying Living Expenses: Very hard  Food Insecurity: No Food Insecurity (10/20/2023)   Received from [facility]   Hunger Vital Sign    Worried About Running Out of Food in the Last Year: Never true    Ran Out of Food in the Last Year: Never true  Transportation Needs: No Transportation Needs (10/20/2023)   Received from [facility] - [facility]    In the past 12 months, has lack of transportation kept you from medical appointments or from getting medications?: No    Lack of Transportation (Non-Medical): No  Physical Activity: Not on file  Stress: Not on file  Social Connections: Unknown (12/15/2021)   Received from [facility]   Social Network    Social Network: Not on file  Intimate Partner Violence: Not At Risk (06/01/2022)   Humiliation, Afraid, Rape, and Kick questionnaire    Fear of Current or Ex-Partner: No    Emotionally Abused: No    Physically Abused: No    Sexually Abused: No    Physical Exam: Vital signs in last 24 hours: @BP  (!) 146/86   Pulse 70   Temp 98.8 F (37.1 C)   Ht 5' 6.5" (1.689 m)   Wt 192 lb (87.1 kg)   SpO2 97%   BMI 30.53 kg/m  GEN: NAD EYE: Sclerae anicteric ENT: MMM CV: Non-tachycardic Pulm: CTA b/l GI: Soft, NT/ND NEURO:  Alert & Oriented x 3   Doristine Locks, DO [facility]   11/03/2023 7:55 AM

## 2023-11-03 NOTE — Progress Notes (Signed)
 Vss nad trans to pacu

## 2023-11-03 NOTE — Patient Instructions (Signed)
Resume previous diet Continue present medications Await pathology results  Handouts/information given for polyps and diverticulosis  YOU HAD AN ENDOSCOPIC PROCEDURE TODAY AT THE [facility]:   Refer to the procedure report that was given to you for any specific questions about what was found during the examination.  If the procedure report does not answer your questions, please call your gastroenterologist to clarify.  If you requested that your care partner not be given the details of your procedure findings, then the procedure report has been included in a sealed envelope for you to review at your convenience later.  YOU SHOULD EXPECT: Some feelings of bloating in the abdomen. Passage of more gas than usual.  Walking can help get rid of the air that was put into your GI tract during the procedure and reduce the bloating. If you had a lower endoscopy (such as a colonoscopy or flexible sigmoidoscopy) you may notice spotting of blood in your stool or on the toilet paper. If you underwent a bowel prep for your procedure, you may not have a normal bowel movement for a few days.  Please Note:  You might notice some irritation and congestion in your nose or some drainage.  This is from the oxygen used during your procedure.  There is no need for concern and it should clear up in a day or so.  SYMPTOMS TO REPORT IMMEDIATELY:  Following lower endoscopy (colonoscopy):  Excessive amounts of blood in the stool  Significant tenderness or worsening of abdominal pains  Swelling of the abdomen that is new, acute  Fever of 100F or higher  For urgent or emergent issues, a gastroenterologist can be reached at any hour by calling [phone]. Do not use MyChart messaging for urgent concerns.   DIET:  We do recommend a small meal at first, but then you may proceed to your regular diet.  Drink plenty of fluids but you should avoid alcoholic beverages for 24 hours.  ACTIVITY:  You should plan to  take it easy for the rest of today and you should NOT DRIVE or use heavy machinery until tomorrow (because of the sedation medicines used during the test).    FOLLOW UP: Our staff will call the number listed on your records the next business day following your procedure.  We will call around 7:15- 8:00 am to check on you and address any questions or concerns that you may have regarding the information given to you following your procedure. If we do not reach you, we will leave a message.     If any biopsies were taken you will be contacted by phone or by letter within the next 1-3 weeks.  Please call us at [phone] if you have not heard about the biopsies in 3 weeks.   SIGNATURES/CONFIDENTIALITY: You and/or your care partner have signed paperwork which will be entered into your electronic medical record.  These signatures attest to the fact that that the information above on your After Visit Summary has been reviewed and is understood.  Full responsibility of the confidentiality of this discharge information lies with you and/or your care-partner.

## 2023-11-03 NOTE — Op Note (Signed)
 [facility] Patient Name: Andrew Patton Procedure Date: 11/03/2023 8:15 AM MRN: [id number] Endoscopist: Doristine Locks , MD, [id number] Age: 63 Referring MD:  Date of Birth: August 25, 1960 Gender: Male Account #: [id number] Procedure:                Colonoscopy Indications:              Screening for colorectal malignant neoplasm (last                            colonoscopy was more than 10 years ago) Medicines:                Monitored Anesthesia Care Procedure:                Pre-Anesthesia Assessment:                           - Prior to the procedure, a History and Physical                            was performed, and patient medications and                            allergies were reviewed. The patient's tolerance of                            previous anesthesia was also reviewed. The risks                            and benefits of the procedure and the sedation                            options and risks were discussed with the patient.                            All questions were answered, and informed consent                            was obtained. Prior Anticoagulants: The patient has                            taken no anticoagulant or antiplatelet agents. ASA                            Grade Assessment: III - A patient with severe                            systemic disease. After reviewing the risks and                            benefits, the patient was deemed in satisfactory                            condition to undergo the procedure.  After obtaining informed consent, the colonoscope                            was passed under direct vision. Throughout the                            procedure, the patient's blood pressure, pulse, and                            oxygen saturations were monitored continuously. The                            Olympus Scope SN: [id number] was introduced through                            the anus and  advanced to the the cecum, identified                            by appendiceal orifice and ileocecal valve. The                            colonoscopy was performed without difficulty. The                            patient tolerated the procedure well. The quality                            of the bowel preparation was good. The ileocecal                            valve, appendiceal orifice, and rectum were                            photographed. Scope In: 8:29:22 AM Scope Out: 8:47:18 AM Scope Withdrawal Time: 0 hours 12 minutes 44 seconds  Total Procedure Duration: 0 hours 17 minutes 56 seconds  Findings:                 Skin tags were found on perianal exam.                           Two sessile polyps were found in the sigmoid colon.                            The polyps were 3 to 5 mm in size. These polyps                            were removed with a cold snare. Resection and                            retrieval were complete. Estimated blood loss was                            minimal.  A few small-mouthed diverticula were found in the                            sigmoid colon.                           The retroflexed view of the distal rectum and anal                            verge was normal and showed no anal or rectal                            abnormalities. Complications:            No immediate complications. Estimated Blood Loss:     Estimated blood loss was minimal. Impression:               - Perianal skin tags found on perianal exam.                           - Two 3 to 5 mm polyps in the sigmoid colon,                            removed with a cold snare. Resected and retrieved.                           - Diverticulosis in the sigmoid colon.                           - The distal rectum and anal verge are normal on                            retroflexion view. Recommendation:           - Patient has a contact number available for                             emergencies. The signs and symptoms of potential                            delayed complications were discussed with the                            patient. Return to normal activities tomorrow.                            Written discharge instructions were provided to the                            patient.                           - Resume previous diet.                           - Continue present medications.                           -  Await pathology results.                           - Repeat colonoscopy for surveillance based on                            pathology results.                           - Return to GI office PRN. Doristine Locks, MD 11/03/2023 8:53:46 AM

## 2023-11-03 NOTE — Progress Notes (Signed)
 Pt's states no medical or surgical changes since previsit or office visit.

## 2023-11-03 NOTE — Progress Notes (Signed)
 Called to room to assist during endoscopic procedure.  Patient ID and intended procedure confirmed with present staff. Received instructions for my participation in the procedure from the performing physician.

## 2023-11-04 ENCOUNTER — Telehealth: Payer: Self-pay

## 2023-11-04 NOTE — Telephone Encounter (Signed)
 Attempted to reach patient for post-procedure f/u call. No answer. Left message for him to please not hesitate to call if he has any questions/concerns regarding his care.

## 2023-11-07 LAB — SURGICAL PATHOLOGY

## 2023-11-09 ENCOUNTER — Encounter: Payer: Self-pay | Admitting: Gastroenterology

## 2024-05-14 ENCOUNTER — Ambulatory Visit: Admitting: Neurology

## 2024-06-06 ENCOUNTER — Ambulatory Visit: Admitting: Diagnostic Neuroimaging

## 2024-06-06 ENCOUNTER — Encounter: Payer: Self-pay | Admitting: Diagnostic Neuroimaging

## 2024-06-06 VITALS — BP 140/72 | HR 81 | Ht 67.0 in | Wt 164.0 lb

## 2024-06-06 DIAGNOSIS — R410 Disorientation, unspecified: Secondary | ICD-10-CM

## 2024-06-06 DIAGNOSIS — K7682 Hepatic encephalopathy: Secondary | ICD-10-CM

## 2024-06-06 DIAGNOSIS — R4689 Other symptoms and signs involving appearance and behavior: Secondary | ICD-10-CM | POA: Diagnosis not present

## 2024-06-06 MED ORDER — LEVETIRACETAM ER 500 MG PO TB24: 1000.0000 mg | ORAL_TABLET | Freq: Every day | ORAL | 4 refills | Status: AC

## 2024-06-06 NOTE — Patient Instructions (Signed)
  ABNORMAL SPELLS (likely delirium from liver disease / hyperammonemia; previously treated for complex partial seizures, but all EEG testing has shown no epileptiform discharges) - continue to wean off levetiracetam  over time; will revisit this in 2026 when wife is doing better and able to drive (for example LEV XR 500mg  daily x 1 month, then every other day x 1 month, then stop; would limit driving at that time also)

## 2024-06-06 NOTE — Progress Notes (Signed)
 [facility]  PATIENT: Andrew Patton DOB: 1961/04/26  REFERRING CLINICIAN: Clarice Nottingham, MD HISTORY FROM: patient  REASON FOR VISIT: follow up   HISTORICAL  CHIEF COMPLAINT:  Chief Complaint  Patient presents with   Seizures    Rm 7 alone Pt is well and stable, reports no sz concerns since last visit.     HISTORY OF PRESENT ILLNESS:   UPDATE (06/06/24, VRP): 63 year old male with history of abnormal spells (seizures vs delirium). S/p liver transplant in March 2024. Since last visit in March 2025, doing well. No further spells or seizures. Tolerating meds. Wife is recovering, but not able to drive currently.   REVIEW OF SYSTEMS: Full 14 system review of systems performed and negative with exception of: as per HPI.  ALLERGIES: Allergies  Allergen Reactions   Fentanyl  Shortness Of Breath and Swelling    Was given larger doses  fentanyl    Rosuvastatin Other (See Comments)    Elevated liver enzymes   Penicillins Rash    Has patient had a PCN reaction causing immediate rash, facial/tongue/throat swelling, SOB or lightheadedness with hypotension: No  Has patient had a PCN reaction causing severe rash involving mucus membranes or skin necrosis: No  Has patient had a PCN reaction that required hospitalization: No  Has patient had a PCN reaction occurring within the last 10 years: No  If all of the above answers are NO, then may proceed with Cephalosporin use.  Other reaction(s): rash   Ciprofloxacin  Rash   Meloxicam Other (See Comments)    Leg pain  meloxicam   Wound Dressing Adhesive Other (See Comments)    Tape causing abrasions on his skin     HOME MEDICATIONS: Outpatient Medications Prior to Visit  Medication Sig Dispense Refill   acetaminophen  (TYLENOL ) 325 MG tablet Take 650 mg by mouth every 8 (eight) hours.     Calcium  Citrate-Vitamin D 315-5 MG-MCG TABS Take 2 tablets by mouth in the morning and at bedtime.     folic acid   (FOLVITE ) 1 MG tablet Take 1 mg by mouth daily.     hydrocortisone  (CORTEF ) 5 MG tablet Take 2 tablets (10 mg) in the morning and 1 tablet (5 mg) in the evening. Double your dose as needed for sick days.     hydroxychloroquine (PLAQUENIL) 200 MG tablet Take 400 mg by mouth daily.     JANUVIA 50 MG tablet Take 1 tablet by mouth daily.     levothyroxine  (SYNTHROID ) 88 MCG tablet Take 88 mcg by mouth every morning.     loratadine  (CLARITIN ) 10 MG tablet Take 1 tablet by mouth daily.     mycophenolate (CELLCEPT) 250 MG capsule Take 250 mg by mouth 2 (two) times daily.     nortriptyline (PAMELOR) 50 MG capsule Take 50 mg by mouth at bedtime. (Patient taking differently: Take 75 mg by mouth at bedtime.)     pantoprazole  (PROTONIX ) 40 MG tablet Take 40 mg by mouth daily.     rosuvastatin (CRESTOR) 10 MG tablet Take 10 mg by mouth daily.     sildenafil (VIAGRA) 100 MG tablet Take 100 mg by mouth daily as needed.     SOLU-CORTEF  100 MG injection Inject 100 mg into the muscle once as needed (for temp >104).     Specialty Vitamins Products (MG PLUS PROTEIN) 133 MG TABS Take 266 mg by mouth.     tacrolimus  (PROGRAF ) 1 MG capsule Take by mouth.     tamsulosin (FLOMAX) 0.4  MG CAPS capsule Take 0.8 mg by mouth daily.     levETIRAcetam  (KEPPRA  XR) 500 MG 24 hr tablet Take 2 tablets (1,000 mg total) by mouth at bedtime. 180 tablet 3   busPIRone  (BUSPAR ) 15 MG tablet Take 1/2 tablet (7.5 mg) in the morning and Take 1 tablet (15 mg) at supper (Patient taking differently: Take 15 mg by mouth 2 (two) times daily.) 135 tablet 4   gabapentin (NEURONTIN) 300 MG capsule Take 300 mg by mouth 3 (three) times daily.     insulin  regular (NOVOLIN R) 100 units/mL injection Inject 2 Units into the skin 3 (three) times daily with meals. Per sliding scale (Patient not taking: Reported on 10/26/2023)     ketoconazole (NIZORAL) 2 % shampoo Apply 1 Application topically as needed.     loperamide (IMODIUM) 2 MG capsule Take 2 mg by  mouth 3 (three) times daily as needed for diarrhea or loose stools. (Patient not taking: Reported on 10/26/2023)     naloxone (NARCAN) nasal spray 4 mg/0.1 mL Place into the nose. (Patient not taking: Reported on 11/03/2023)     ondansetron  (ZOFRAN  ODT) 4 MG disintegrating tablet Take 1 tablet (4 mg total) by mouth every 8 (eight) hours as needed for nausea or vomiting. (Patient taking differently: Take 4 mg by mouth every 8 (eight) hours as needed for nausea or vomiting (dissolve orally).) 6 tablet 0   polyethylene glycol powder (GLYCOLAX/MIRALAX) 17 GM/SCOOP powder Take 17 g by mouth daily as needed for mild constipation. (Patient not taking: Reported on 10/26/2023)     ursodiol (ACTIGALL) 300 MG capsule Take 300 mg by mouth 2 (two) times daily.     valGANciclovir (VALCYTE) 450 MG tablet Take 900 mg by mouth daily. (Patient not taking: Reported on 10/26/2023)     No facility-administered medications prior to visit.      PHYSICAL EXAM  GENERAL EXAM/CONSTITUTIONAL: Vitals:  Vitals:   06/06/24 0958  BP: (!) 140/72  Pulse: 81  Weight: 164 lb (74.4 kg)  Height: 5' 7 (1.702 m)   Body mass index is 25.69 kg/m. Wt Readings from Last 3 Encounters:  06/06/24 164 lb (74.4 kg)  11/03/23 192 lb (87.1 kg)  10/26/23 190 lb (86.2 kg)   Patient is in no distress; well developed, nourished and groomed; neck is supple  CARDIOVASCULAR: Examination of carotid arteries is normal; no carotid bruits Regular rate and rhythm, no murmurs Examination of peripheral vascular system by observation and palpation is normal  EYES: Ophthalmoscopic exam of optic discs and posterior segments is normal; no papilledema or hemorrhages No results found.  MUSCULOSKELETAL: Gait, strength, tone, movements noted in Neurologic exam below  NEUROLOGIC: MENTAL STATUS:     04/25/2023    9:21 AM 09/16/2022    8:47 AM 06/17/2022    9:04 AM  MMSE - Mini Mental State Exam  Orientation to time 5 5 3   Orientation to Place  5 5 4   Registration 3 3 3   Attention/ Calculation 4 1 2   Recall 3 3 3   Language- name 2 objects 2 2 2   Language- repeat 1 1 1   Language- follow 3 step command 3 3 3   Language- read & follow direction 1 1 1   Write a sentence 1 0 1  Copy design 1 0 0  Total score 29 24 23    awake, alert, oriented to person, place and time recent and remote memory intact normal attention and concentration language fluent, comprehension intact, naming intact fund of knowledge appropriate  CRANIAL NERVE:  2nd - no papilledema on fundoscopic exam 2nd, 3rd, 4th, 6th - pupils equal and reactive to light, visual fields full to confrontation, extraocular muscles intact, no nystagmus 5th - facial sensation symmetric 7th - facial strength symmetric 8th - hearing intact 9th - palate elevates symmetrically, uvula midline 11th - shoulder shrug symmetric 12th - tongue protrusion midline  MOTOR:  normal bulk and tone, full strength in the BUE, BLE FINE POSTURAL TREMOR IN HANDS  SENSORY:  normal and symmetric to light touch  COORDINATION:  finger-nose-finger, fine finger movements normal  REFLEXES:  deep tendon reflexes 1+ and symmetric  GAIT/STATION:  narrow based gait     DIAGNOSTIC DATA (LABS, IMAGING, TESTING) - I reviewed patient records, labs, notes, testing and imaging myself where available.  Lab Results  Component Value Date   WBC 3.3 (L) 03/06/2023   HGB 8.8 (L) 03/06/2023   HCT 29.1 (L) 03/06/2023   MCV 88.2 03/06/2023   PLT 352 03/06/2023      Component Value Date/Time   NA 137 03/06/2023 2130   NA 138 11/13/2020 1153   K 4.6 03/06/2023 2130   CL 102 03/06/2023 2130   CO2 23 03/06/2023 2130   GLUCOSE 99 03/06/2023 2130   BUN 18 03/06/2023 2130   BUN 6 11/13/2020 1153   CREATININE 1.13 03/06/2023 2130   CALCIUM  8.5 (L) 03/06/2023 2130   PROT 6.2 (L) 03/06/2023 2130   PROT 7.2 11/13/2020 1153   ALBUMIN  3.2 (L) 03/06/2023 2130   ALBUMIN  4.4 11/13/2020 1153   AST 17  03/06/2023 2130   ALT 14 03/06/2023 2130   ALKPHOS 143 (H) 03/06/2023 2130   BILITOT 0.7 03/06/2023 2130   BILITOT 2.6 (H) 11/13/2020 1153   GFRNONAA >60 03/06/2023 2130   GFRAA 108 09/04/2020 0908   No results found for: CHOL, HDL, LDLCALC, LDLDIRECT, TRIG, CHOLHDL Lab Results  Component Value Date   HGBA1C 5.2 05/28/2022   Lab Results  Component Value Date   VITAMINB12 1,018 (H) 05/31/2022   Lab Results  Component Value Date   TSH 1.477 07/01/2022    04/25/23 EEG - Impression: This is a normal EEG recorded while drowsy and awake. No evidence of interictal epileptiform discharges. Normal EEGs, however, do not rule out epilepsy.    ASSESSMENT AND PLAN  63 y.o. year old male here with:  Dx:  1. Spell of abnormal behavior   2. Delirium   3. Encephalopathy, hepatic (HCC)     PLAN:  ABNORMAL SPELLS (since ~2021; now resolved since liver transplant; possible delirium from liver disease / hyperammonemia; previously treated for complex partial seizures, but all EEG testing has shown no epileptiform discharges) - continue to wean off levetiracetam  over time; will revisit this in 2026 when wife is doing better and able to drive (for example LEV XR 500mg  daily x 1 month, then every other day x 1 month, then stop; would limit driving at that time also)  Meds ordered this encounter  Medications   levETIRAcetam  (KEPPRA  XR) 500 MG 24 hr tablet    Sig: Take 2 tablets (1,000 mg total) by mouth at bedtime.    Dispense:  180 tablet    Refill:  4   Return in about 6 months (around 12/04/2024) for with NP Johnnie Russell), MyChart visit (15 min).  I spent 41 minutes of face-to-face and non-face-to-face time with patient.  This included previsit chart review, lab review, study review, order entry, electronic health record documentation, patient education.  EDUARD FABIENE HANLON, MD 06/06/2024, 11:07 AM Certified in Neurology, Neurophysiology and Neuroimaging  [facility] [address], [address] [place], [place] [address] [phone]

## 2024-12-25 ENCOUNTER — Telehealth: Admitting: Adult Health
